# Patient Record
Sex: Female | Born: 1935 | Race: White | Hispanic: No | Marital: Married | Smoking: Never smoker
Health system: Southern US, Community
[De-identification: ages and names within clinical notes are randomized; demographics above are authoritative.]

## PROBLEM LIST (undated history)

## (undated) DIAGNOSIS — E782 Mixed hyperlipidemia: Secondary | ICD-10-CM

## (undated) DIAGNOSIS — I340 Nonrheumatic mitral (valve) insufficiency: Secondary | ICD-10-CM

## (undated) DIAGNOSIS — R600 Localized edema: Secondary | ICD-10-CM

## (undated) DIAGNOSIS — E559 Vitamin D deficiency, unspecified: Secondary | ICD-10-CM

## (undated) DIAGNOSIS — M6289 Other specified disorders of muscle: Secondary | ICD-10-CM

## (undated) DIAGNOSIS — M1990 Unspecified osteoarthritis, unspecified site: Secondary | ICD-10-CM

## (undated) DIAGNOSIS — M51369 Other intervertebral disc degeneration, lumbar region without mention of lumbar back pain or lower extremity pain: Secondary | ICD-10-CM

## (undated) DIAGNOSIS — M5136 Other intervertebral disc degeneration, lumbar region: Secondary | ICD-10-CM

## (undated) DIAGNOSIS — I129 Hypertensive chronic kidney disease with stage 1 through stage 4 chronic kidney disease, or unspecified chronic kidney disease: Secondary | ICD-10-CM

## (undated) DIAGNOSIS — R9431 Abnormal electrocardiogram [ECG] [EKG]: Secondary | ICD-10-CM

## (undated) DIAGNOSIS — I351 Nonrheumatic aortic (valve) insufficiency: Secondary | ICD-10-CM

## (undated) DIAGNOSIS — G5600 Carpal tunnel syndrome, unspecified upper limb: Secondary | ICD-10-CM

## (undated) DIAGNOSIS — J90 Pleural effusion, not elsewhere classified: Secondary | ICD-10-CM

## (undated) DIAGNOSIS — C4490 Unspecified malignant neoplasm of skin, unspecified: Secondary | ICD-10-CM

## (undated) DIAGNOSIS — Z780 Asymptomatic menopausal state: Secondary | ICD-10-CM

## (undated) DIAGNOSIS — I10 Essential (primary) hypertension: Secondary | ICD-10-CM

## (undated) DIAGNOSIS — N183 Chronic kidney disease, stage 3 unspecified: Secondary | ICD-10-CM

## (undated) HISTORY — DX: Abnormal electrocardiogram (ECG) (EKG): R94.31

## (undated) HISTORY — DX: Carpal tunnel syndrome, unspecified upper limb: G56.00

## (undated) HISTORY — DX: Asymptomatic menopausal state: Z78.0

## (undated) HISTORY — DX: Nonrheumatic aortic (valve) insufficiency: I35.1

## (undated) HISTORY — DX: Unspecified osteoarthritis, unspecified site: M19.90

## (undated) HISTORY — DX: Hypertensive chronic kidney disease with stage 1 through stage 4 chronic kidney disease, or unspecified chronic kidney disease: I12.9

## (undated) HISTORY — DX: Other intervertebral disc degeneration, lumbar region: M51.36

## (undated) HISTORY — DX: Chronic kidney disease, stage 3 unspecified: N18.30

## (undated) HISTORY — PX: CARPAL TUNNEL RELEASE: SHX101

## (undated) HISTORY — DX: Other specified disorders of muscle: M62.89

## (undated) HISTORY — DX: Mixed hyperlipidemia: E78.2

## (undated) HISTORY — DX: Other intervertebral disc degeneration, lumbar region without mention of lumbar back pain or lower extremity pain: M51.369

## (undated) HISTORY — DX: Localized edema: R60.0

## (undated) HISTORY — DX: Pleural effusion, not elsewhere classified: J90

## (undated) HISTORY — DX: Vitamin D deficiency, unspecified: E55.9

## (undated) HISTORY — DX: Nonrheumatic mitral (valve) insufficiency: I34.0

---

## 2000-02-24 ENCOUNTER — Encounter: Payer: Self-pay | Admitting: Obstetrics and Gynecology

## 2000-02-24 ENCOUNTER — Encounter: Admission: RE | Admit: 2000-02-24 | Discharge: 2000-02-24 | Payer: Self-pay | Admitting: Obstetrics and Gynecology

## 2000-04-14 ENCOUNTER — Other Ambulatory Visit: Admission: RE | Admit: 2000-04-14 | Discharge: 2000-04-14 | Payer: Self-pay | Admitting: Obstetrics and Gynecology

## 2001-02-25 ENCOUNTER — Encounter: Admission: RE | Admit: 2001-02-25 | Discharge: 2001-02-25 | Payer: Self-pay | Admitting: Obstetrics and Gynecology

## 2001-02-25 ENCOUNTER — Encounter: Payer: Self-pay | Admitting: Obstetrics and Gynecology

## 2001-03-25 ENCOUNTER — Ambulatory Visit (HOSPITAL_COMMUNITY): Admission: RE | Admit: 2001-03-25 | Discharge: 2001-03-25 | Payer: Self-pay | Admitting: *Deleted

## 2001-04-18 ENCOUNTER — Other Ambulatory Visit: Admission: RE | Admit: 2001-04-18 | Discharge: 2001-04-18 | Payer: Self-pay | Admitting: Obstetrics and Gynecology

## 2002-02-27 ENCOUNTER — Encounter: Payer: Self-pay | Admitting: Obstetrics and Gynecology

## 2002-02-27 ENCOUNTER — Encounter: Admission: RE | Admit: 2002-02-27 | Discharge: 2002-02-27 | Payer: Self-pay | Admitting: Obstetrics and Gynecology

## 2003-03-01 ENCOUNTER — Encounter: Admission: RE | Admit: 2003-03-01 | Discharge: 2003-03-01 | Payer: Self-pay | Admitting: Obstetrics and Gynecology

## 2003-03-01 ENCOUNTER — Encounter: Payer: Self-pay | Admitting: Obstetrics and Gynecology

## 2004-01-25 ENCOUNTER — Ambulatory Visit (HOSPITAL_COMMUNITY): Admission: RE | Admit: 2004-01-25 | Discharge: 2004-01-25 | Payer: Self-pay | Admitting: Orthopedic Surgery

## 2004-03-12 ENCOUNTER — Encounter: Admission: RE | Admit: 2004-03-12 | Discharge: 2004-03-12 | Payer: Self-pay | Admitting: Internal Medicine

## 2005-03-30 ENCOUNTER — Encounter: Admission: RE | Admit: 2005-03-30 | Discharge: 2005-03-30 | Payer: Self-pay | Admitting: Obstetrics and Gynecology

## 2006-04-01 ENCOUNTER — Encounter: Admission: RE | Admit: 2006-04-01 | Discharge: 2006-04-01 | Payer: Self-pay | Admitting: Obstetrics and Gynecology

## 2007-04-05 ENCOUNTER — Encounter: Admission: RE | Admit: 2007-04-05 | Discharge: 2007-04-05 | Payer: Self-pay | Admitting: Internal Medicine

## 2007-12-22 HISTORY — PX: ANTERIOR CERVICAL DECOMP/DISCECTOMY FUSION: SHX1161

## 2008-04-06 ENCOUNTER — Encounter: Admission: RE | Admit: 2008-04-06 | Discharge: 2008-04-06 | Payer: Self-pay | Admitting: Internal Medicine

## 2008-12-17 ENCOUNTER — Inpatient Hospital Stay (HOSPITAL_COMMUNITY): Admission: RE | Admit: 2008-12-17 | Discharge: 2008-12-18 | Payer: Self-pay | Admitting: Neurosurgery

## 2009-04-04 ENCOUNTER — Encounter: Admission: RE | Admit: 2009-04-04 | Discharge: 2009-04-04 | Payer: Self-pay | Admitting: Internal Medicine

## 2010-04-07 ENCOUNTER — Encounter: Admission: RE | Admit: 2010-04-07 | Discharge: 2010-04-07 | Payer: Self-pay | Admitting: Internal Medicine

## 2010-04-10 ENCOUNTER — Encounter: Admission: RE | Admit: 2010-04-10 | Discharge: 2010-04-10 | Payer: Self-pay | Admitting: Internal Medicine

## 2011-03-10 ENCOUNTER — Other Ambulatory Visit: Payer: Self-pay | Admitting: Internal Medicine

## 2011-03-10 DIAGNOSIS — Z1231 Encounter for screening mammogram for malignant neoplasm of breast: Secondary | ICD-10-CM

## 2011-04-13 ENCOUNTER — Ambulatory Visit: Payer: Self-pay

## 2011-04-20 ENCOUNTER — Ambulatory Visit
Admission: RE | Admit: 2011-04-20 | Discharge: 2011-04-20 | Disposition: A | Discharge status: Home / Self Care | Payer: BC Managed Care – PPO | Source: Ambulatory Visit | Attending: Internal Medicine | Admitting: Internal Medicine

## 2011-04-20 DIAGNOSIS — Z1231 Encounter for screening mammogram for malignant neoplasm of breast: Secondary | ICD-10-CM

## 2011-05-05 NOTE — Op Note (Signed)
NAME:  Jillian Cooley, Jillian Cooley NO.:  [id number]   MEDICAL RECORD NO.:  [id number]          PATIENT TYPE:  INP   LOCATION:                               FACILITY:  [facility]   PHYSICIAN:  Ophelia Charter, M.D.DATE OF BIRTH:  [birth year]   DATE OF PROCEDURE:  12/16/2008  DATE OF DISCHARGE:                               OPERATIVE REPORT   BRIEF HISTORY:  The patient is a 77 year old white female who has  suffered from neck and arm pain.  She failed medical management, was  worked up with a cervical MRI which demonstrated that she had severe  stenosis and herniated disk spondylosis at C5-C6.  I discussed the  various treatment option with the patient and recommended she consider  surgery.  The patient has weighed the risks, benefits, and alternatives  of the surgery and decided to proceed with C5-C6 anterior cervical  diskectomy with fusion plating.   PREOPERATIVE DIAGNOSES:  C5-C6 spondylosis, herniated nucleus pulposus,  stenosis, cervical radiculopathy, transverse myelopathy, and  cervicalgia.   POSTOPERATIVE DIAGNOSES:  C5-C6 spondylosis, herniated nucleus pulposus,  stenosis, cervical radiculopathy, transverse myelopathy, and  cervicalgia.   PROCEDURE:  C5-C6 extensive anterior cervical diskectomy/decompression;  C5-C6 anterior interbody arthrodesis with local morselized autograft  bone and Actifuse bone graft extender; insertion of C5-C6 interbody  prosthesis (Novel PEEK interbody prosthesis); C5-C6 anterior cervical  plating with Codman Slim-Loc titanium plate and screws.   SURGEON:  Ophelia Charter, MD   ASSISTANT:  Ashok Pall, MD   ANESTHESIA:  General endotracheal.   ESTIMATED BLOOD LOSS:  75 mL.   SPECIMENS:  None.   DRAINS:  None.   COMPLICATIONS:  None.   DESCRIPTION OF PROCEDURE:  The patient was brought to the operating room  by the anesthesia team.  General endotracheal anesthesia was induced.  The patient remained in supine position.  A  roll was placed under her  shoulders to place her neck in slight extension.  Her anterior cervical  region was then prepared with Betadine scrub and Betadine solution.  Sterile drapes were applied.  I then injected the area to be incised  with Marcaine with epinephrine solution.  I used a scalpel to make a  transverse incision in the patient's left anterior neck.  I used a  Metzenbaum scissors to divide the platysma muscle and then to dissect  medial to sternocleidomastoid muscle, jugular vein, and carotid artery.  I carefully dissected down towards the anterior cervical spine  identifying the esophagus to retract it medially.  I then used a skin  swabs to clear the soft tissue from anterior cervical spine.  I then  inserted a bent spinal needle into the upper exposed intervertebral  space.  We obtained the intraoperative radiograph to confirm our  location.  We then used electrocautery to detach the medial border of  the longus colli muscle bilaterally from C5-C6 intervertebral disk  space.  We inserted the Caspar self-retaining retractor underneath the  longus colli muscle bilaterally to provide exposure.   We began the decompression by incising C5-6 intervertebral disk, it was  quite spondylotic.  We then inserted  distraction screws at C5-C6,  distracted interspace, and then we used a high-speed drill to drill away  some ventral spondylosis and to drill away the remainder of C5  intervertebral disk to decorticate the vertebral endplates at 075-GRM to  drill away some posterior spondylosis to thin out the posterior  longitudinal ligament.  We then incised the ligament with arachnoid  knife and then removed it with Kerrison punch undercutting the vertebral  endplates at 075-GRM, decompressing the thecal sac, there was quite a bit  of spondylosis.  We did encounter a large central soft ruptured disk.  We then performed wide foraminotomies about the bilateral C6 nerve root  completing the  decompression at this level.   We now turned our attention to arthrodesis.  We used a trial spacer,  then determined to use a 6-mm medium interbody prosthesis.  We prefilled  the prosthesis with a combination of local autograft bone we obtained  during the decompression as well as Actifuse bone graft extender.  We  inserted prosthesis into distracted C5-6 interspace and then removed the  distraction screws.  There was a good snug fit of the prosthesis in the  interspace.  This completed the arthrodesis and the insertion of the  prosthesis.   We now turned our attention to anterior spinal instrumentation.  We used  a high-speed drill to remove some ventral spondylosis from the vertebral  endplates at D34-534, so the plate would lie down flat.  We selected  appropriate length Codman Slim-Loc titanium plate and laid it along the  anterior aspect of the vertebral bodies at C5-C6.  We then drilled two  12-mm holes at C5-C6 and this secured the plate to the vertebral bodies  by placing two 12-mm self-tapping screws at C5-C6.  We then obtained  intraoperative radiograph which demonstrated good position of the plate  and screws and interbody prosthesis.  We then secured the screws to the  plate by locking each cam.   We then obtained hemostasis using bipolar electrocautery. We irrigated  the wound out with bacitracin solution, removed the retractor.  We  inspected esophagus for any damage, there was none apparent.  We then  reapproximated the patient's platysma muscle with interrupted 3-0 Vicryl  suture, the subcutaneous tissue with interrupted 3-0 Vicryl suture, and  the skin with Steri-Strips and Benzoin. The wound was then coated with  bacitracin ointment.  A sterile dressing was applied.  The drapes were  removed, and the patient was subsequently extubated by anesthesia team  and transported to the post anesthesia care unit in stable condition.  All sponge, instrument, and needle counts were  correct at the end of  this case.      Ophelia Charter, M.D.  Electronically Signed     JDJ/MEDQ  D:  12/17/2008  T:  12/18/2008  Job:  [id number]

## 2011-05-08 NOTE — Procedures (Signed)
[facility]  Patient:    Jillian Cooley, Jillian Cooley                   MRN: [id number] Proc. Date: 03/25/01 Adm. Date:  [id number] Attending:  Jim Desanctis                           Procedure Report  PROCEDURE:  Colonoscopy.  INDICATIONS:  Ms. Jillian Cooley is undergoing colonoscopy for colon cancer screening.  ANESTHESIA:  Demerol 80 mg, Versed 8 mg.  DESCRIPTION OF PROCEDURE:  With the patient mildly sedated in the left lateral decubitus position, the Olympus videoscopic colonoscope was inserted into the rectum and passed through a tortuous sigmoid colon to the cecum.  The cecum identified by the ileocecal valve and appendiceal orifice, both of which were photographed.  From this point, the colonoscope was slowly withdrawn taking circumferential views of the entire colonic mucosa, stopping only in the rectum, which appeared normal on direct view and showed internal hemorrhoids in retroflexed view.  The endoscope was straightened and withdrawn.  The patients vital signs and pulse oximeter remained stable.  The patient tolerated the procedure well without apparent complications.  FINDINGS:  Internal hemorrhoids.  Otherwise unremarkable examination.  PLAN:  Repeat examination in five to ten years. DD:  03/25/01 TD:  03/25/01 Job: [id number]

## 2011-09-25 LAB — BASIC METABOLIC PANEL WITH GFR
BUN: 23 mg/dL (ref 6–23)
CO2: 28 meq/L (ref 19–32)
Calcium: 9.1 mg/dL (ref 8.4–10.5)
Chloride: 102 meq/L (ref 96–112)
Creatinine, Ser: 1.37 mg/dL — ABNORMAL HIGH (ref 0.4–1.2)
GFR calc non Af Amer: 38 mL/min — ABNORMAL LOW
Glucose, Bld: 99 mg/dL (ref 70–99)
Potassium: 4.2 meq/L (ref 3.5–5.1)
Sodium: 138 meq/L (ref 135–145)

## 2011-09-25 LAB — CBC
HCT: 37.9 % (ref 36.0–46.0)
Hemoglobin: 12.7 g/dL (ref 12.0–15.0)
MCHC: 33.5 g/dL (ref 30.0–36.0)
MCV: 90.7 fL (ref 78.0–100.0)
Platelets: 191 10*3/uL (ref 150–400)
RBC: 4.18 MIL/uL (ref 3.87–5.11)
RDW: 11.7 % (ref 11.5–15.5)
WBC: 6 10*3/uL (ref 4.0–10.5)

## 2012-03-31 ENCOUNTER — Other Ambulatory Visit: Payer: Self-pay | Admitting: Internal Medicine

## 2012-03-31 DIAGNOSIS — Z1231 Encounter for screening mammogram for malignant neoplasm of breast: Secondary | ICD-10-CM

## 2012-04-29 ENCOUNTER — Ambulatory Visit: Payer: BC Managed Care – PPO

## 2012-05-03 ENCOUNTER — Ambulatory Visit: Payer: BC Managed Care – PPO

## 2012-05-10 ENCOUNTER — Ambulatory Visit
Admission: RE | Admit: 2012-05-10 | Discharge: 2012-05-10 | Disposition: A | Discharge status: Home / Self Care | Payer: Medicare Other | Source: Ambulatory Visit | Attending: Internal Medicine | Admitting: Internal Medicine

## 2012-05-10 DIAGNOSIS — Z1231 Encounter for screening mammogram for malignant neoplasm of breast: Secondary | ICD-10-CM

## 2012-05-24 ENCOUNTER — Other Ambulatory Visit: Payer: Self-pay | Admitting: Neurosurgery

## 2012-05-30 ENCOUNTER — Encounter (HOSPITAL_COMMUNITY): Payer: Self-pay | Admitting: Pharmacy Technician

## 2012-06-03 ENCOUNTER — Encounter (HOSPITAL_COMMUNITY): Payer: Self-pay | Admitting: *Deleted

## 2012-06-03 NOTE — Pre-Procedure Instructions (Addendum)
[place]  06/03/2012   Your procedure is scheduled on:  June 24  Report to [facility] at 09:00 AM.  Call this number if you have problems the morning of surgery: [phone]   Remember:   Do not eat or drink:After Midnight.  Take these medicines the morning of surgery with A SIP OF WATER: None   STOP Multiple vitamins and Calcium today (6/19)  Do not wear jewelry, make-up or nail polish.  Do not wear lotions, powders, or perfumes. You may wear deodorant.  Do not shave 48 hours prior to surgery. Men may shave face and neck.  Do not bring valuables to the hospital.  Contacts, dentures or bridgework may not be worn into surgery.  Leave suitcase in the car. After surgery it may be brought to your room.  For patients admitted to the hospital, checkout time is 11:00 AM the day of discharge.             Roselie Awkward [id number] spouse  Special Instructions: CHG Shower Use Special Wash: 1/2 bottle night before surgery and 1/2 bottle morning of surgery.   Please read over the following fact sheets that you were given: Pain Booklet, Coughing and Deep Breathing, MRSA Information and Surgical Site Infection Prevention

## 2012-06-03 NOTE — Progress Notes (Signed)
EKG requested from [facility]. Also requested Chest X-ray if available.

## 2012-06-08 ENCOUNTER — Encounter (HOSPITAL_COMMUNITY)
Admission: RE | Admit: 2012-06-08 | Discharge: 2012-06-08 | Disposition: A | Discharge status: Home / Self Care | Payer: Medicare Other | Source: Ambulatory Visit | Attending: Neurosurgery | Admitting: Neurosurgery

## 2012-06-08 ENCOUNTER — Encounter (HOSPITAL_COMMUNITY): Payer: Self-pay

## 2012-06-08 LAB — CBC
HCT: 36.8 % (ref 36.0–46.0)
MCH: 30.5 pg (ref 26.0–34.0)
MCV: 88.5 fL (ref 78.0–100.0)
Platelets: 163 10*3/uL (ref 150–400)

## 2012-06-08 LAB — BASIC METABOLIC PANEL
BUN: 22 mg/dL (ref 6–23)
CO2: 30 mEq/L (ref 19–32)
Chloride: 100 mEq/L (ref 96–112)
Creatinine, Ser: 1.26 mg/dL — ABNORMAL HIGH (ref 0.50–1.10)
GFR calc Af Amer: 46 mL/min — ABNORMAL LOW (ref >90)
Potassium: 3.7 mEq/L (ref 3.5–5.1)
Sodium: 138 mEq/L (ref 135–145)

## 2012-06-08 LAB — SURGICAL PCR SCREEN: MRSA, PCR: NEGATIVE

## 2012-06-12 MED ORDER — CEFAZOLIN SODIUM 1-5 GM-% IV SOLN
1.0000 g | INTRAVENOUS | Status: AC
  Administered 2012-06-13: 1 g via INTRAVENOUS
  Filled 2012-06-12: qty 50

## 2012-06-13 ENCOUNTER — Ambulatory Visit (HOSPITAL_COMMUNITY): Payer: Medicare Other | Admitting: Anesthesiology

## 2012-06-13 ENCOUNTER — Inpatient Hospital Stay (HOSPITAL_COMMUNITY)
Admission: RE | Admit: 2012-06-13 | Discharge: 2012-06-13 | DRG: 491 | Disposition: A | Discharge status: Home / Self Care | Payer: Medicare Other | Source: Ambulatory Visit | Attending: Neurosurgery | Admitting: Neurosurgery

## 2012-06-13 ENCOUNTER — Encounter (HOSPITAL_COMMUNITY): Payer: Self-pay | Admitting: Anesthesiology

## 2012-06-13 ENCOUNTER — Encounter (HOSPITAL_COMMUNITY): Payer: Self-pay | Admitting: *Deleted

## 2012-06-13 ENCOUNTER — Encounter (HOSPITAL_COMMUNITY)
Admission: RE | Disposition: A | Discharge status: Home / Self Care | Payer: Self-pay | Source: Ambulatory Visit | Attending: Neurosurgery

## 2012-06-13 ENCOUNTER — Ambulatory Visit (HOSPITAL_COMMUNITY): Payer: Medicare Other

## 2012-06-13 DIAGNOSIS — M5126 Other intervertebral disc displacement, lumbar region: Principal | ICD-10-CM

## 2012-06-13 DIAGNOSIS — Z85828 Personal history of other malignant neoplasm of skin: Secondary | ICD-10-CM

## 2012-06-13 DIAGNOSIS — I10 Essential (primary) hypertension: Secondary | ICD-10-CM | POA: Diagnosis present

## 2012-06-13 HISTORY — DX: Essential (primary) hypertension: I10

## 2012-06-13 HISTORY — DX: Unspecified malignant neoplasm of skin, unspecified: C44.90

## 2012-06-13 HISTORY — PX: LUMBAR LAMINECTOMY/DECOMPRESSION MICRODISCECTOMY: SHX5026

## 2012-06-13 SURGERY — LUMBAR LAMINECTOMY/DECOMPRESSION MICRODISCECTOMY 1 LEVEL
Anesthesia: General | Laterality: Right | Wound class: Clean

## 2012-06-13 MED ORDER — DIAZEPAM 5 MG PO TABS: 5.0000 mg | ORAL_TABLET | Freq: Four times a day (QID) | ORAL | Status: AC | PRN

## 2012-06-13 MED ORDER — GLYCOPYRROLATE 0.2 MG/ML IJ SOLN
INTRAMUSCULAR | Status: DC | PRN
  Administered 2012-06-13: 0.4 mg via INTRAVENOUS

## 2012-06-13 MED ORDER — CEFAZOLIN SODIUM-DEXTROSE 2-3 GM-% IV SOLR
2.0000 g | Freq: Three times a day (TID) | INTRAVENOUS | Status: DC
  Administered 2012-06-13: 2 g via INTRAVENOUS
  Filled 2012-06-13 (×2): qty 50

## 2012-06-13 MED ORDER — OXYCODONE-ACETAMINOPHEN 5-325 MG PO TABS: 1.0000 | ORAL_TABLET | ORAL | Status: AC | PRN

## 2012-06-13 MED ORDER — SODIUM CHLORIDE 0.9 % IR SOLN
Status: DC | PRN
  Administered 2012-06-13: 07:00:00

## 2012-06-13 MED ORDER — DOCUSATE SODIUM 100 MG PO CAPS: 100.0000 mg | ORAL_CAPSULE | Freq: Two times a day (BID) | ORAL | Status: DC

## 2012-06-13 MED ORDER — HYDROCODONE-ACETAMINOPHEN 5-325 MG PO TABS: 1.0000 | ORAL_TABLET | ORAL | Status: DC | PRN

## 2012-06-13 MED ORDER — OXYCODONE-ACETAMINOPHEN 5-325 MG PO TABS
1.0000 | ORAL_TABLET | ORAL | Status: DC | PRN
  Administered 2012-06-13: 1 via ORAL
  Filled 2012-06-13: qty 1

## 2012-06-13 MED ORDER — ACETAMINOPHEN 650 MG RE SUPP: 650.0000 mg | RECTAL | Status: DC | PRN

## 2012-06-13 MED ORDER — SIMVASTATIN 10 MG PO TABS
10.0000 mg | ORAL_TABLET | Freq: Every day | ORAL | Status: DC
  Filled 2012-06-13: qty 1

## 2012-06-13 MED ORDER — ACETAMINOPHEN 10 MG/ML IV SOLN
INTRAVENOUS | Status: AC
  Administered 2012-06-13: 1000 mg via INTRAVENOUS
  Filled 2012-06-13: qty 100

## 2012-06-13 MED ORDER — FENTANYL CITRATE 0.05 MG/ML IJ SOLN
INTRAMUSCULAR | Status: DC | PRN
  Administered 2012-06-13: 150 ug via INTRAVENOUS

## 2012-06-13 MED ORDER — THROMBIN 5000 UNITS EX SOLR
CUTANEOUS | Status: DC | PRN
  Administered 2012-06-13 (×2): 5000 [IU] via TOPICAL

## 2012-06-13 MED ORDER — MORPHINE SULFATE 2 MG/ML IJ SOLN: 1.0000 mg | INTRAMUSCULAR | Status: DC | PRN

## 2012-06-13 MED ORDER — DEXAMETHASONE SODIUM PHOSPHATE 4 MG/ML IJ SOLN
INTRAMUSCULAR | Status: DC | PRN
  Administered 2012-06-13: 8 mg via INTRAVENOUS

## 2012-06-13 MED ORDER — ZOLPIDEM TARTRATE 5 MG PO TABS: 5.0000 mg | ORAL_TABLET | Freq: Every evening | ORAL | Status: DC | PRN

## 2012-06-13 MED ORDER — BACITRACIN 50000 UNITS IM SOLR
INTRAMUSCULAR | Status: AC
  Filled 2012-06-13: qty 1

## 2012-06-13 MED ORDER — ACETAMINOPHEN 325 MG PO TABS: 650.0000 mg | ORAL_TABLET | ORAL | Status: DC | PRN

## 2012-06-13 MED ORDER — LACTATED RINGERS IV SOLN: INTRAVENOUS | Status: DC

## 2012-06-13 MED ORDER — DIAZEPAM 5 MG PO TABS: 5.0000 mg | ORAL_TABLET | Freq: Four times a day (QID) | ORAL | Status: DC | PRN

## 2012-06-13 MED ORDER — MENTHOL 3 MG MT LOZG: 1.0000 | LOZENGE | OROMUCOSAL | Status: DC | PRN

## 2012-06-13 MED ORDER — DEXTROSE 5 % IV SOLN
INTRAVENOUS | Status: DC | PRN
  Administered 2012-06-13: 08:00:00 via INTRAVENOUS

## 2012-06-13 MED ORDER — LACTATED RINGERS IV SOLN
INTRAVENOUS | Status: DC | PRN
  Administered 2012-06-13 (×2): via INTRAVENOUS

## 2012-06-13 MED ORDER — HYDROMORPHONE HCL PF 1 MG/ML IJ SOLN
INTRAMUSCULAR | Status: AC
  Filled 2012-06-13: qty 1

## 2012-06-13 MED ORDER — 0.9 % SODIUM CHLORIDE (POUR BTL) OPTIME
TOPICAL | Status: DC | PRN
  Administered 2012-06-13: 1000 mL

## 2012-06-13 MED ORDER — TRIAMTERENE-HCTZ 37.5-25 MG PO TABS
1.0000 | ORAL_TABLET | Freq: Every day | ORAL | Status: DC
  Filled 2012-06-13: qty 1

## 2012-06-13 MED ORDER — ROCURONIUM BROMIDE 100 MG/10ML IV SOLN
INTRAVENOUS | Status: DC | PRN
  Administered 2012-06-13: 50 mg via INTRAVENOUS

## 2012-06-13 MED ORDER — PHENYLEPHRINE HCL 10 MG/ML IJ SOLN
INTRAMUSCULAR | Status: DC | PRN
  Administered 2012-06-13 (×2): 80 ug via INTRAVENOUS
  Administered 2012-06-13: 40 ug via INTRAVENOUS
  Administered 2012-06-13 (×2): 80 ug via INTRAVENOUS

## 2012-06-13 MED ORDER — LIDOCAINE HCL (CARDIAC) 20 MG/ML IV SOLN
INTRAVENOUS | Status: DC | PRN
  Administered 2012-06-13: 70 mg via INTRAVENOUS

## 2012-06-13 MED ORDER — PHENOL 1.4 % MT LIQD: 1.0000 | OROMUCOSAL | Status: DC | PRN

## 2012-06-13 MED ORDER — SODIUM CHLORIDE 0.9 % IV SOLN
INTRAVENOUS | Status: AC
  Filled 2012-06-13: qty 500

## 2012-06-13 MED ORDER — ONDANSETRON HCL 4 MG/2ML IJ SOLN
4.0000 mg | INTRAMUSCULAR | Status: DC | PRN
  Administered 2012-06-13: 4 mg via INTRAVENOUS
  Filled 2012-06-13: qty 2

## 2012-06-13 MED ORDER — HEMOSTATIC AGENTS (NO CHARGE) OPTIME
TOPICAL | Status: DC | PRN
  Administered 2012-06-13: 1 via TOPICAL

## 2012-06-13 MED ORDER — DSS 100 MG PO CAPS: 100.0000 mg | ORAL_CAPSULE | Freq: Two times a day (BID) | ORAL | Status: AC

## 2012-06-13 MED ORDER — HYDROMORPHONE HCL PF 1 MG/ML IJ SOLN
0.2500 mg | INTRAMUSCULAR | Status: DC | PRN
  Administered 2012-06-13: 0.5 mg via INTRAVENOUS

## 2012-06-13 MED ORDER — BUPIVACAINE-EPINEPHRINE PF 0.5-1:200000 % IJ SOLN
INTRAMUSCULAR | Status: DC | PRN
  Administered 2012-06-13: 13 mL

## 2012-06-13 MED ORDER — NEOSTIGMINE METHYLSULFATE 1 MG/ML IJ SOLN
INTRAMUSCULAR | Status: DC | PRN
  Administered 2012-06-13: 3 mg via INTRAVENOUS

## 2012-06-13 MED ORDER — ONDANSETRON HCL 4 MG/2ML IJ SOLN
INTRAMUSCULAR | Status: DC | PRN
  Administered 2012-06-13: 4 mg via INTRAVENOUS

## 2012-06-13 MED ORDER — MIDAZOLAM HCL 5 MG/5ML IJ SOLN
INTRAMUSCULAR | Status: DC | PRN
  Administered 2012-06-13: 2 mg via INTRAVENOUS

## 2012-06-13 MED ORDER — PROPOFOL 10 MG/ML IV EMUL
INTRAVENOUS | Status: DC | PRN
  Administered 2012-06-13: 140 mg via INTRAVENOUS

## 2012-06-13 SURGICAL SUPPLY — 54 items
APL SKNCLS STERI-STRIP NONHPOA (GAUZE/BANDAGES/DRESSINGS) ×1
BAG DECANTER FOR FLEXI CONT (MISCELLANEOUS) ×2 IMPLANT
BENZOIN TINCTURE PRP APPL 2/3 (GAUZE/BANDAGES/DRESSINGS) ×2 IMPLANT
BLADE SURG ROTATE 9660 (MISCELLANEOUS) IMPLANT
BRUSH SCRUB EZ PLAIN DRY (MISCELLANEOUS) ×2 IMPLANT
BUR ACORN 6.0 (BURR) ×2 IMPLANT
BUR MATCHSTICK NEURO 3.0 LAGG (BURR) ×2 IMPLANT
CANISTER SUCTION 2500CC (MISCELLANEOUS) ×2 IMPLANT
CLOTH BEACON ORANGE TIMEOUT ST (SAFETY) ×2 IMPLANT
CONT SPEC 4OZ CLIKSEAL STRL BL (MISCELLANEOUS) ×2 IMPLANT
DRAPE LAPAROTOMY 100X72X124 (DRAPES) ×2 IMPLANT
DRAPE MICROSCOPE LEICA (MISCELLANEOUS) ×2 IMPLANT
DRAPE POUCH INSTRU U-SHP 10X18 (DRAPES) ×2 IMPLANT
DRAPE SURG 17X23 STRL (DRAPES) ×8 IMPLANT
ELECT BLADE 4.0 EZ CLEAN MEGAD (MISCELLANEOUS) ×2
ELECT REM PT RETURN 9FT ADLT (ELECTROSURGICAL) ×2
ELECTRODE BLDE 4.0 EZ CLN MEGD (MISCELLANEOUS) ×1 IMPLANT
ELECTRODE REM PT RTRN 9FT ADLT (ELECTROSURGICAL) ×1 IMPLANT
GAUZE SPONGE 4X4 16PLY XRAY LF (GAUZE/BANDAGES/DRESSINGS) IMPLANT
GLOVE BIO SURGEON STRL SZ8.5 (GLOVE) ×2 IMPLANT
GLOVE BIOGEL PI IND STRL 8.5 (GLOVE) IMPLANT
GLOVE BIOGEL PI INDICATOR 8.5 (GLOVE) ×2
GLOVE ECLIPSE 7.5 STRL STRAW (GLOVE) ×1 IMPLANT
GLOVE EXAM NITRILE LRG STRL (GLOVE) IMPLANT
GLOVE EXAM NITRILE MD LF STRL (GLOVE) ×1 IMPLANT
GLOVE EXAM NITRILE XL STR (GLOVE) IMPLANT
GLOVE EXAM NITRILE XS STR PU (GLOVE) IMPLANT
GLOVE INDICATOR 8.0 STRL GRN (GLOVE) ×1 IMPLANT
GLOVE SS BIOGEL STRL SZ 8 (GLOVE) ×1 IMPLANT
GLOVE SUPERSENSE BIOGEL SZ 8 (GLOVE) ×1
GOWN BRE IMP SLV AUR LG STRL (GOWN DISPOSABLE) IMPLANT
GOWN BRE IMP SLV AUR XL STRL (GOWN DISPOSABLE) ×3 IMPLANT
GOWN STRL REIN 2XL LVL4 (GOWN DISPOSABLE) IMPLANT
KIT BASIN OR (CUSTOM PROCEDURE TRAY) ×2 IMPLANT
KIT ROOM TURNOVER OR (KITS) ×2 IMPLANT
NDL HYPO 21X1.5 SAFETY (NEEDLE) IMPLANT
NEEDLE HYPO 21X1.5 SAFETY (NEEDLE) IMPLANT
NEEDLE HYPO 22GX1.5 SAFETY (NEEDLE) ×2 IMPLANT
NS IRRIG 1000ML POUR BTL (IV SOLUTION) ×2 IMPLANT
PACK LAMINECTOMY NEURO (CUSTOM PROCEDURE TRAY) ×2 IMPLANT
PAD ARMBOARD 7.5X6 YLW CONV (MISCELLANEOUS) ×6 IMPLANT
PATTIES SURGICAL .5 X1 (DISPOSABLE) IMPLANT
RUBBERBAND STERILE (MISCELLANEOUS) ×4 IMPLANT
SPONGE GAUZE 4X4 12PLY (GAUZE/BANDAGES/DRESSINGS) ×2 IMPLANT
SPONGE SURGIFOAM ABS GEL SZ50 (HEMOSTASIS) ×2 IMPLANT
STRIP CLOSURE SKIN 1/2X4 (GAUZE/BANDAGES/DRESSINGS) ×2 IMPLANT
SUT VIC AB 1 CT1 18XBRD ANBCTR (SUTURE) ×1 IMPLANT
SUT VIC AB 1 CT1 8-18 (SUTURE) ×2
SUT VIC AB 2-0 CP2 18 (SUTURE) ×2 IMPLANT
SYR 20CC LL (SYRINGE) IMPLANT
SYR 20ML ECCENTRIC (SYRINGE) ×2 IMPLANT
TOWEL OR 17X24 6PK STRL BLUE (TOWEL DISPOSABLE) ×2 IMPLANT
TOWEL OR 17X26 10 PK STRL BLUE (TOWEL DISPOSABLE) ×2 IMPLANT
WATER STERILE IRR 1000ML POUR (IV SOLUTION) ×2 IMPLANT

## 2012-06-13 NOTE — Discharge Instructions (Signed)

## 2012-06-13 NOTE — Preoperative (Signed)
Beta Blockers   Reason not to administer Beta Blockers:Not Applicable 

## 2012-06-13 NOTE — Anesthesia Procedure Notes (Signed)
Procedure Name: Intubation Date/Time: 06/13/2012 7:36 AM Performed by: Wanita Chamberlain Pre-anesthesia Checklist: Patient identified, Emergency Drugs available, Suction available, Patient being monitored and Timeout performed Patient Re-evaluated:Patient Re-evaluated prior to inductionOxygen Delivery Method: Circle system utilized Preoxygenation: Pre-oxygenation with 100% oxygen Intubation Type: IV induction Ventilation: Mask ventilation without difficulty Laryngoscope Size: Mac and 3 Grade View: Grade I Tube type: Oral Tube size: 7.5 mm Number of attempts: 1 Airway Equipment and Method: Stylet Placement Confirmation: ETT inserted through vocal cords under direct vision,  breath sounds checked- equal and bilateral and positive ETCO2 Secured at: 22 cm Tube secured with: Tape Dental Injury: Teeth and Oropharynx as per pre-operative assessment

## 2012-06-13 NOTE — OR Nursing (Signed)
Time out done at [facility] not [phone]

## 2012-06-13 NOTE — Progress Notes (Signed)
Pt. Tolerated procedure well. Pt. Alert and oriented,follows simple instructions, denies pain. Incision area without swelling, redness or S/S of infection. Voiding adequate clear yellow urine. Moving all extremities well and vitals stable and documented. Lumbar surgery notes instructions given to patient and family member for home safety and precautions.Pt and family stated understanding of instructions given

## 2012-06-13 NOTE — Progress Notes (Signed)
Patient ID: Jillian Cooley, female   DOB: April 16, 1932, 78 y.o.   MRN: [id number] Subjective:  The patient is alert and pleasant. She looks well. She is in no apparent distress.  Objective: Vital signs in last 24 hours: Temp:  [96.8 F (36 C)-97.8 F (36.6 C)] 96.8 F (36 C) (06/24 0933) Pulse Rate:  [66-70] 70  (06/24 0943) Resp:  [18-24] 24  (06/24 0943) BP: (130-146)/(57-81) 130/57 mmHg (06/24 0933) SpO2:  [99 %-100 %] 100 % (06/24 0943)  Intake/Output from previous day:   Intake/Output this shift: Total I/O In: 1850 [I.V.:1850] Out: 25 [Blood:25]  Physical exam the patient is alert and pleasant. She is moving all 4 extremities well. Her dressing is clean and dry.  Lab Results: No results found for this basename: WBC:2,HGB:2,HCT:2,PLT:2 in the last 72 hours BMET No results found for this basename: NA:2,K:2,CL:2,CO2:2,GLUCOSE:2,BUN:2,CREATININE:2,CALCIUM:2 in the last 72 hours  Studies/Results: No results found.  Assessment/Plan: The patient is doing well.  LOS: 0 days     Najla Aughenbaugh D 06/13/2012, 9:49 AM

## 2012-06-13 NOTE — Anesthesia Preprocedure Evaluation (Addendum)
Anesthesia Evaluation  Patient identified by MRN, date of birth, ID band Patient awake    Reviewed: Allergy & Precautions, H&P , NPO status , Patient's Chart, lab work & pertinent test results  History of Anesthesia Complications Negative for: history of anesthetic complications  Airway Mallampati: II TM Distance: >3 FB     Dental  (+) Teeth Intact   Pulmonary neg pulmonary ROS,    Pulmonary exam normal       Cardiovascular Exercise Tolerance: Good hypertension, Pt. on medications Rhythm:Regular Rate:Normal     Neuro/Psych Previous ACDF negative psych ROS   GI/Hepatic negative GI ROS, Neg liver ROS,   Endo/Other  negative endocrine ROS  Renal/GU negative Renal ROS  negative genitourinary   Musculoskeletal negative musculoskeletal ROS (+)   Abdominal Normal abdominal exam  (+)   Peds  Hematology negative hematology ROS (+)   Anesthesia Other Findings   Reproductive/Obstetrics negative OB ROS                         Anesthesia Physical Anesthesia Plan  ASA: II  Anesthesia Plan: General   Post-op Pain Management:    Induction: Intravenous  Airway Management Planned: Oral ETT  Additional Equipment:   Intra-op Plan:   Post-operative Plan: Extubation in OR  Informed Consent:   Dental advisory given  Plan Discussed with: CRNA  Anesthesia Plan Comments:         Anesthesia Quick Evaluation

## 2012-06-13 NOTE — H&P (Signed)
Subjective: The patient is a 78 year old white female who I performed a C5-6 anterior cervicectomy fusion and plating on back in December 2009. The patient has done well for many years. She has developed back and right leg pain consistent with a lumbar radiculopathy. She failed medical management and was worked up with a lumbar MRI. This demonstrated the patient has disc herniation at L2-3. I discussed the various treatment with the patient including surgery. She has weighed the risks, benefits, and alternatives surgery decided proceed with a right L2-3 discectomy.   Past Medical History  Diagnosis Date  . Hypertension   . Skin cancer     Past Surgical History  Procedure Date  . Anterior cervical decomp/discectomy fusion 2009    Allergies  Allergen Reactions  . Sulfa Antibiotics Nausea Only    History  Substance Use Topics  . Smoking status: Never Smoker   . Smokeless tobacco: Never Used  . Alcohol Use: No    History reviewed. No pertinent family history. Prior to Admission medications   Medication Sig Start Date End Date Taking? Authorizing Provider  CALCIUM-VITAMIN D PO Take 1 tablet by mouth daily.   Yes Historical Provider, MD  lovastatin (MEVACOR) 20 MG tablet Take 20 mg by mouth at bedtime.   Yes Historical Provider, MD  Multiple Vitamins-Minerals (ICAPS AREDS FORMULA PO) Take 1 capsule by mouth daily.   Yes Historical Provider, MD  triamterene-hydrochlorothiazide (MAXZIDE-25) 37.5-25 MG per tablet Take 1 tablet by mouth daily.   Yes Historical Provider, MD     Review of Systems  Positive ROS: As above  All other systems have been reviewed and were otherwise negative with the exception of those mentioned in the HPI and as above.  Objective: Vital signs in last 24 hours: Temp:  [97.8 F (36.6 C)] 97.8 F (36.6 C) (06/24 0625) Pulse Rate:  [66] 66  (06/24 0625) Resp:  [18] 18  (06/24 0625) BP: (146)/(81) 146/81 mmHg (06/24 0625) SpO2:  [99 %] 99 % (06/24  0625)  General Appearance: Alert, cooperative, no distress, appears stated age Head: Normocephalic, without obvious abnormality, atraumatic Eyes: PERRL, conjunctiva/corneas clear, EOM's intact, fundi benign, both eyes      Ears: Normal TM's and external ear canals, both ears Throat: Lips, mucosa, and tongue normal; teeth and gums normal Neck: Supple, symmetrical, trachea midline, no adenopathy; thyroid: No enlargement/tenderness/nodules; no carotid bruit or JVD the patient's surgical incision is well-healed. Back: Symmetric, no curvature, ROM normal, no CVA tenderness Lungs: Clear to auscultation bilaterally, respirations unlabored Heart: Regular rate and rhythm, S1 and S2 normal, no murmur, rub or gallop Abdomen: Soft, non-tender, bowel sounds active all four quadrants, no masses, no organomegaly Extremities: Extremities normal, atraumatic, no cyanosis or edema Pulses: 2+ and symmetric all extremities Skin: Skin color, texture, turgor normal, no rashes or lesions  NEUROLOGIC:   Mental status: alert and oriented, no aphasia, good attention span, Fund of knowledge/ memory ok Motor Exam - grossly normal Sensory Exam - grossly normal Reflexes:  Coordination - grossly normal Gait - grossly normal Balance - grossly normal Cranial Nerves: I: smell Not tested  II: visual acuity  OS: Normal    OD: Normal   II: visual fields Full to confrontation  II: pupils Equal, round, reactive to light  III,VII: ptosis None  III,IV,VI: extraocular muscles  Full ROM  V: mastication Normal  V: facial light touch sensation  Normal  V,VII: corneal reflex  Present  VII: facial muscle function - upper  Normal  VII:  facial muscle function - lower Normal  VIII: hearing Not tested  IX: soft palate elevation  Normal  IX,X: gag reflex Present  XI: trapezius strength  5/5  XI: sternocleidomastoid strength 5/5  XI: neck flexion strength  5/5  XII: tongue strength  Normal    Data Review Lab Results   Component Value Date   WBC 5.3 06/08/2012   HGB 12.7 06/08/2012   HCT 36.8 06/08/2012   MCV 88.5 06/08/2012   PLT 163 06/08/2012   Lab Results  Component Value Date   NA 138 06/08/2012   K 3.7 06/08/2012   CL 100 06/08/2012   CO2 30 06/08/2012   BUN 22 06/08/2012   CREATININE 1.26* 06/08/2012   GLUCOSE 95 06/08/2012   No results found for this basename: INR, PROTIME    Assessment/Plan: Right L2-3 disc herniation, spinal stenosis, lumbar radiculopathy, lumbago: I discussed situation with the patient and her husband. I reviewed the MR scan with them and pointed out the abnormalities. We have discussed the various treatment options including a right L2-3 discectomy. I've shown her surgical models. We have discussed the risk, benefits, alternatives and likelihood of achieving our goals with surgery. I have answered all the patient and her husband's questions. She wants to proceed with the operation. Yvette Loveless D 06/13/2012 7:20 AM

## 2012-06-13 NOTE — Anesthesia Postprocedure Evaluation (Signed)
  Anesthesia Post-op Note  Patient: Jillian Cooley  Procedure(s) Performed: Procedure(s) (LRB): LUMBAR LAMINECTOMY/DECOMPRESSION MICRODISCECTOMY 1 LEVEL (Right)  Patient Location: PACU  Anesthesia Type: General  Level of Consciousness: awake  Airway and Oxygen Therapy: Patient Spontanous Breathing  Post-op Pain: mild  Post-op Assessment: Post-op Vital signs reviewed  Post-op Vital Signs: Reviewed  Complications: No apparent anesthesia complications

## 2012-06-13 NOTE — Progress Notes (Signed)
Patient ID: Jillian Cooley, female   DOB: 05-Jun-1932, 78 y.o.   MRN: [id number] Subjective:  The patient is alert and pleasant. She looks well. She has no complaints.  Objective: Vital signs in last 24 hours: Temp:  [96.8 F (36 C)-97.8 F (36.6 C)] 97.6 F (36.4 C) (06/24 1230) Pulse Rate:  [56-70] 56  (06/24 1230) Resp:  [11-24] 18  (06/24 1230) BP: (114-146)/(53-81) 136/53 mmHg (06/24 1230) SpO2:  [94 %-100 %] 96 % (06/24 1230)  Intake/Output from previous day:   Intake/Output this shift: Total I/O In: 2090 [P.O.:240; I.V.:1850] Out: 25 [Blood:25]  Physical exam the patient is alert and oriented. She is moving all 4 extremities well.  Lab Results: No results found for this basename: WBC:2,HGB:2,HCT:2,PLT:2 in the last 72 hours BMET No results found for this basename: NA:2,K:2,CL:2,CO2:2,GLUCOSE:2,BUN:2,CREATININE:2,CALCIUM:2 in the last 72 hours  Studies/Results: Dg Lumbar Spine 2-3 Views  06/13/2012  *RADIOLOGY REPORT*  Clinical Data: Left L2-3 discectomy  LUMBAR SPINE - 2-3 VIEW  Comparison: None.  Findings: Initial image does not show a visible marker.  Second image shows a probe between the spinous processes of L3 and L4.  IMPRESSION: L3-4 interspinous space localized.  Original Report Authenticated By: Jules Schick, M.D.    Assessment/Plan: The patient is doing well and may go home later on this afternoon. I gave her her discharge instructions and answered all her questions.  LOS: 0 days     [name] D 06/13/2012, 1:00 PM

## 2012-06-13 NOTE — Op Note (Signed)
Brief history: The patient is a 78 year old white female who has suffered from back and right leg pain consistent with a lumbar radiculopathy. She has failed medical management and was worked up with a lumbar MRI. This demonstrated a large herniated disc at L2-3 on the right. I discussed the various treatment option with the patient including surgery. She has weighed the risks, benefits, and alternatives surgery and decided proceed with a right L2-3 discectomy.  Preoperative diagnosis: Right L2-3 herniated disc compressing both the right L2 and right L3 nerve roots, lumbar spinal stenosis, lumbar radiculopathy, lumbago  Postoperative diagnosis: The same  Procedure: Right L2-3 Intervertebral discectomy to decompress the right L2 as well as L3 nerve roots using micro-dissection  Surgeon: Dr. Earle Gell  Asst.: Dr. Hazle Coca  Anesthesia: Gen. endotracheal  Estimated blood loss: 50 cc  Drains: None  Complications: None  Description of procedure: The patient was brought to the operating room by the anesthesia team. General endotracheal anesthesia was induced. The patient was turned to the prone position on the Wilson frame. [name] lumbosacral region was then prepared with Betadine scrub and Betadine solution. Sterile drapes were applied.  I then injected the area to be incised with Marcaine with epinephrine solution. I then used a scalpel to make a linear midline incision over the L2-3 intervertebral disc space. I then used electrocautery to perform a right sided subperiosteal dissection exposing the spinous process and lamina of L2 and L3. We obtained intraoperative radiograph to confirm our location. I then inserted the [facility] retractor for exposure.  We then brought the operative microscope into the field. Under its magnification and illumination we completed the microdissection. I used a high-speed drill to perform a laminotomy at L2. I then used a Kerrison punches to widen the  laminotomy and removed the ligamentum flavum at L2-3 on the right. We then used microdissection to free up the thecal sac and the right L3 and L2 nerve root from the epidural tissue. I then used a Kerrison punch to perform a foraminotomy at about the right L2 and L3 nerve root. We then using the nerve root retractor to gently retract the thecal sac and the right L3 nerve root medially. This exposed the intervertebral disc. It had migrated in a cephalad direction and was compressing both the L3 nerve root as well as the upper L2 nerve root We identified the ruptured disc and remove it with the pituitary forceps decompressing both the right L2 and L3 nerve roots.  I then palpated along the ventral surface of the thecal sac and along exit route of the right L2 and L3 nerve root and noted that the neural structures were well decompressed. This completed the decompression.  We then obtained hemostasis using bipolar electrocautery. We irrigated the wound out with bacitracin solution. We then removed the retractor. We then reapproximated the patient's thoracolumbar fascia with interrupted #1 Vicryl suture. We then reapproximated the patient's subcutaneous tissue with interrupted 3-0 Vicryl suture. We then reapproximated patient's skin with Steri-Strips and benzoin. The was then coated with bacitracin ointment. The drapes were removed. The patient was subsequently returned to the supine position where they were extubated by the anesthesia team. The patient was then transported to the postanesthesia care unit in stable condition. All sponge instrument and needle counts were correct at the end of this case.

## 2012-06-13 NOTE — Transfer of Care (Signed)
Immediate Anesthesia Transfer of Care Note  Patient: Jillian Cooley  Procedure(s) Performed: Procedure(s) (LRB): LUMBAR LAMINECTOMY/DECOMPRESSION MICRODISCECTOMY 1 LEVEL (Right)  Patient Location: PACU  Anesthesia Type: General  Level of Consciousness: awake, alert , oriented and patient cooperative  Airway & Oxygen Therapy: Patient Spontanous Breathing and Patient connected to nasal cannula oxygen  Post-op Assessment: Report given to PACU RN and Post -op Vital signs reviewed and stable  Post vital signs: Reviewed and stable  Complications: No apparent anesthesia complications

## 2012-06-13 NOTE — Plan of Care (Signed)
Problem: Consults Goal: Diagnosis - Spinal Surgery Outcome: Completed/Met Date Met:  06/13/12 Microdiscectomy

## 2012-06-14 NOTE — Progress Notes (Signed)
Utilization review completed. Sharniece Gibbon, RN, BSN. 

## 2012-06-16 ENCOUNTER — Encounter (HOSPITAL_COMMUNITY): Payer: Self-pay | Admitting: Neurosurgery

## 2012-06-27 NOTE — Discharge Summary (Signed)
  Physician Discharge Summary  Patient ID: QUANNA SCHOLLMEYER MRN: [id number] DOB/AGE: Jan 20, 1932 78 y.o.  Admit date: 06/13/2012 Discharge date: 06/27/2012  Admission Diagnoses: Right L2-3 herniated disc, lumbago, lumbar radiculopathy  Discharge Diagnoses: The same Principal Problem:  *Herniated nucleus pulposus, lumbar   Discharged Condition: good  Hospital Course: I admitted the patient to [facility] on 06/13/2012. On that day I performed a right L2-3 discectomy. The surgery went well. [name] postop course was unremarkable and she was discharged home on 06/13/12.  Consults: Right L2-3 herniated disc Significant Diagnostic Studies: None Treatments: Right L2-3 discectomy to decompress the right L2 and L3 nerve roots Discharge Exam: Blood pressure 114/75, pulse 63, temperature 97.7 F (36.5 C), temperature source Oral, resp. rate 20, SpO2 93.00%. The patient is alert and oriented. Her strength is normal. She looks well.  Disposition: Home   Medication List  As of 06/27/2012  7:36 AM   TAKE these medications         CALCIUM-VITAMIN D PO   Take 1 tablet by mouth daily.      ICAPS AREDS FORMULA PO   Take 1 capsule by mouth daily.      lovastatin 20 MG tablet   Commonly known as: MEVACOR   Take 20 mg by mouth at bedtime.      triamterene-hydrochlorothiazide 37.5-25 MG per tablet   Commonly known as: MAXZIDE-25   Take 1 tablet by mouth daily.             SignedOphelia Charter 06/27/2012, 7:36 AM

## 2013-04-17 ENCOUNTER — Other Ambulatory Visit: Payer: Self-pay

## 2013-04-17 DIAGNOSIS — Z1231 Encounter for screening mammogram for malignant neoplasm of breast: Secondary | ICD-10-CM

## 2013-05-11 ENCOUNTER — Ambulatory Visit
Admission: RE | Admit: 2013-05-11 | Discharge: 2013-05-11 | Disposition: A | Discharge status: Home / Self Care | Payer: Medicare Other | Source: Ambulatory Visit

## 2013-05-11 ENCOUNTER — Ambulatory Visit: Payer: Medicare Other

## 2013-05-11 DIAGNOSIS — Z1231 Encounter for screening mammogram for malignant neoplasm of breast: Secondary | ICD-10-CM

## 2014-04-10 ENCOUNTER — Other Ambulatory Visit: Payer: Self-pay

## 2014-04-10 DIAGNOSIS — Z1231 Encounter for screening mammogram for malignant neoplasm of breast: Secondary | ICD-10-CM

## 2014-05-15 ENCOUNTER — Encounter (INDEPENDENT_AMBULATORY_CARE_PROVIDER_SITE_OTHER): Payer: Self-pay

## 2014-05-15 ENCOUNTER — Ambulatory Visit
Admission: RE | Admit: 2014-05-15 | Discharge: 2014-05-15 | Disposition: A | Discharge status: Home / Self Care | Payer: Medicare Other | Source: Ambulatory Visit

## 2014-05-15 DIAGNOSIS — Z1231 Encounter for screening mammogram for malignant neoplasm of breast: Secondary | ICD-10-CM

## 2015-04-17 ENCOUNTER — Other Ambulatory Visit: Payer: Self-pay

## 2015-04-17 DIAGNOSIS — Z1231 Encounter for screening mammogram for malignant neoplasm of breast: Secondary | ICD-10-CM

## 2015-05-17 ENCOUNTER — Ambulatory Visit: Payer: Self-pay

## 2015-05-21 ENCOUNTER — Ambulatory Visit
Admission: RE | Admit: 2015-05-21 | Discharge: 2015-05-21 | Disposition: A | Discharge status: Home / Self Care | Payer: Medicare Other | Source: Ambulatory Visit

## 2015-05-21 DIAGNOSIS — Z1231 Encounter for screening mammogram for malignant neoplasm of breast: Secondary | ICD-10-CM

## 2015-06-17 ENCOUNTER — Other Ambulatory Visit: Payer: Self-pay

## 2016-04-27 ENCOUNTER — Other Ambulatory Visit: Payer: Self-pay

## 2016-04-27 DIAGNOSIS — Z1231 Encounter for screening mammogram for malignant neoplasm of breast: Secondary | ICD-10-CM

## 2016-05-21 ENCOUNTER — Ambulatory Visit
Admission: RE | Admit: 2016-05-21 | Discharge: 2016-05-21 | Disposition: A | Discharge status: Home / Self Care | Payer: Medicare Other | Source: Ambulatory Visit

## 2016-05-21 DIAGNOSIS — Z1231 Encounter for screening mammogram for malignant neoplasm of breast: Secondary | ICD-10-CM

## 2017-05-21 ENCOUNTER — Other Ambulatory Visit: Payer: Self-pay | Admitting: Internal Medicine

## 2017-05-21 DIAGNOSIS — Z1231 Encounter for screening mammogram for malignant neoplasm of breast: Secondary | ICD-10-CM

## 2017-06-10 ENCOUNTER — Ambulatory Visit
Admission: RE | Admit: 2017-06-10 | Discharge: 2017-06-10 | Disposition: A | Discharge status: Home / Self Care | Payer: Medicare Other | Source: Ambulatory Visit | Attending: Internal Medicine | Admitting: Internal Medicine

## 2017-06-10 DIAGNOSIS — Z1231 Encounter for screening mammogram for malignant neoplasm of breast: Secondary | ICD-10-CM

## 2018-07-25 ENCOUNTER — Other Ambulatory Visit: Payer: Self-pay | Admitting: Internal Medicine

## 2018-07-25 DIAGNOSIS — Z1231 Encounter for screening mammogram for malignant neoplasm of breast: Secondary | ICD-10-CM

## 2018-08-19 ENCOUNTER — Ambulatory Visit
Admission: RE | Admit: 2018-08-19 | Discharge: 2018-08-19 | Disposition: A | Discharge status: Home / Self Care | Payer: Medicare Other | Source: Ambulatory Visit | Attending: Internal Medicine | Admitting: Internal Medicine

## 2018-08-19 DIAGNOSIS — Z1231 Encounter for screening mammogram for malignant neoplasm of breast: Secondary | ICD-10-CM

## 2019-07-18 ENCOUNTER — Other Ambulatory Visit: Payer: Self-pay | Admitting: Internal Medicine

## 2019-07-18 DIAGNOSIS — Z1231 Encounter for screening mammogram for malignant neoplasm of breast: Secondary | ICD-10-CM

## 2019-09-05 ENCOUNTER — Other Ambulatory Visit: Payer: Self-pay

## 2019-09-05 ENCOUNTER — Ambulatory Visit
Admission: RE | Admit: 2019-09-05 | Discharge: 2019-09-05 | Disposition: A | Discharge status: Home / Self Care | Payer: Medicare Other | Source: Ambulatory Visit | Attending: Internal Medicine | Admitting: Internal Medicine

## 2019-09-05 DIAGNOSIS — Z1231 Encounter for screening mammogram for malignant neoplasm of breast: Secondary | ICD-10-CM

## 2020-01-09 ENCOUNTER — Ambulatory Visit: Payer: Medicare PPO | Attending: Internal Medicine

## 2020-01-09 DIAGNOSIS — Z23 Encounter for immunization: Secondary | ICD-10-CM | POA: Insufficient documentation

## 2020-01-09 NOTE — Progress Notes (Signed)
   Covid-19 Vaccination Clinic  Name:  [name]    MRN: [id number] DOB: February 19, 1932  01/09/2020  [name] was observed post Covid-19 immunization for 15 minutes without incidence. She was provided with Vaccine Information Sheet and instruction to access the V-Safe system.   [name] was instructed to call 911 with any severe reactions post vaccine: Marland Kitchen Difficulty breathing  . Swelling of your face and throat  . A fast heartbeat  . A bad rash all over your body  . Dizziness and weakness    Immunizations Administered    Name Date Dose VIS Date Route   Pfizer COVID-19 Vaccine 01/09/2020  9:14 AM 0.3 mL 12/01/2019 Intramuscular   Manufacturer: [place]   Lot: [id number]   [place]: [id number]

## 2020-01-10 DIAGNOSIS — H35722 Serous detachment of retinal pigment epithelium, left eye: Secondary | ICD-10-CM | POA: Diagnosis not present

## 2020-01-10 DIAGNOSIS — H35363 Drusen (degenerative) of macula, bilateral: Secondary | ICD-10-CM | POA: Diagnosis not present

## 2020-01-10 DIAGNOSIS — H35453 Secondary pigmentary degeneration, bilateral: Secondary | ICD-10-CM | POA: Diagnosis not present

## 2020-01-10 DIAGNOSIS — H35373 Puckering of macula, bilateral: Secondary | ICD-10-CM | POA: Diagnosis not present

## 2020-01-10 DIAGNOSIS — H353114 Nonexudative age-related macular degeneration, right eye, advanced atrophic with subfoveal involvement: Secondary | ICD-10-CM | POA: Diagnosis not present

## 2020-01-10 DIAGNOSIS — H353122 Nonexudative age-related macular degeneration, left eye, intermediate dry stage: Secondary | ICD-10-CM | POA: Diagnosis not present

## 2020-01-10 DIAGNOSIS — Z961 Presence of intraocular lens: Secondary | ICD-10-CM | POA: Diagnosis not present

## 2020-01-19 DIAGNOSIS — G5601 Carpal tunnel syndrome, right upper limb: Secondary | ICD-10-CM | POA: Diagnosis not present

## 2020-01-27 ENCOUNTER — Ambulatory Visit: Payer: Medicare PPO

## 2020-01-29 ENCOUNTER — Ambulatory Visit: Payer: Medicare PPO | Attending: Internal Medicine

## 2020-01-29 DIAGNOSIS — Z23 Encounter for immunization: Secondary | ICD-10-CM | POA: Insufficient documentation

## 2020-01-29 NOTE — Progress Notes (Signed)
   Covid-19 Vaccination Clinic  Name:  Nohelia Valenza    MRN: [id number] DOB: [birth year]  01/29/2020  Ms. Mceachron was observed post Covid-19 immunization for 15 minutes without incidence. She was provided with Vaccine Information Sheet and instruction to access the V-Safe system.   Ms. Sweney was instructed to call 911 with any severe reactions post vaccine: Marland Kitchen Difficulty breathing  . Swelling of your face and throat  . A fast heartbeat  . A bad rash all over your body  . Dizziness and weakness    Immunizations Administered    Name Date Dose VIS Date Route   Pfizer COVID-19 Vaccine 01/29/2020  1:17 PM 0.3 mL 12/01/2019 Intramuscular   Manufacturer: [place]   Lot: [id number]   [place]: [id number]

## 2020-02-06 DIAGNOSIS — C44622 Squamous cell carcinoma of skin of right upper limb, including shoulder: Secondary | ICD-10-CM | POA: Diagnosis not present

## 2020-02-07 DIAGNOSIS — C44622 Squamous cell carcinoma of skin of right upper limb, including shoulder: Secondary | ICD-10-CM | POA: Diagnosis not present

## 2020-03-11 DIAGNOSIS — I129 Hypertensive chronic kidney disease with stage 1 through stage 4 chronic kidney disease, or unspecified chronic kidney disease: Secondary | ICD-10-CM | POA: Diagnosis not present

## 2020-03-11 DIAGNOSIS — N183 Chronic kidney disease, stage 3 unspecified: Secondary | ICD-10-CM | POA: Diagnosis not present

## 2020-03-11 DIAGNOSIS — E782 Mixed hyperlipidemia: Secondary | ICD-10-CM | POA: Diagnosis not present

## 2020-03-13 DIAGNOSIS — I129 Hypertensive chronic kidney disease with stage 1 through stage 4 chronic kidney disease, or unspecified chronic kidney disease: Secondary | ICD-10-CM | POA: Diagnosis not present

## 2020-03-13 DIAGNOSIS — E782 Mixed hyperlipidemia: Secondary | ICD-10-CM | POA: Diagnosis not present

## 2020-03-13 DIAGNOSIS — N1832 Chronic kidney disease, stage 3b: Secondary | ICD-10-CM | POA: Diagnosis not present

## 2020-03-13 DIAGNOSIS — M150 Primary generalized (osteo)arthritis: Secondary | ICD-10-CM | POA: Diagnosis not present

## 2020-03-13 DIAGNOSIS — E559 Vitamin D deficiency, unspecified: Secondary | ICD-10-CM | POA: Diagnosis not present

## 2020-04-15 DIAGNOSIS — H35373 Puckering of macula, bilateral: Secondary | ICD-10-CM | POA: Diagnosis not present

## 2020-04-15 DIAGNOSIS — H53411 Scotoma involving central area, right eye: Secondary | ICD-10-CM | POA: Diagnosis not present

## 2020-04-15 DIAGNOSIS — H35453 Secondary pigmentary degeneration, bilateral: Secondary | ICD-10-CM | POA: Diagnosis not present

## 2020-04-15 DIAGNOSIS — H353122 Nonexudative age-related macular degeneration, left eye, intermediate dry stage: Secondary | ICD-10-CM | POA: Diagnosis not present

## 2020-04-15 DIAGNOSIS — H35722 Serous detachment of retinal pigment epithelium, left eye: Secondary | ICD-10-CM | POA: Diagnosis not present

## 2020-04-15 DIAGNOSIS — H35363 Drusen (degenerative) of macula, bilateral: Secondary | ICD-10-CM | POA: Diagnosis not present

## 2020-04-15 DIAGNOSIS — Z961 Presence of intraocular lens: Secondary | ICD-10-CM | POA: Diagnosis not present

## 2020-04-15 DIAGNOSIS — H353114 Nonexudative age-related macular degeneration, right eye, advanced atrophic with subfoveal involvement: Secondary | ICD-10-CM | POA: Diagnosis not present

## 2020-05-29 DIAGNOSIS — G5602 Carpal tunnel syndrome, left upper limb: Secondary | ICD-10-CM | POA: Diagnosis not present

## 2020-06-03 DIAGNOSIS — M654 Radial styloid tenosynovitis [de Quervain]: Secondary | ICD-10-CM | POA: Diagnosis not present

## 2020-06-03 DIAGNOSIS — G5603 Carpal tunnel syndrome, bilateral upper limbs: Secondary | ICD-10-CM | POA: Diagnosis not present

## 2020-06-10 DIAGNOSIS — G5603 Carpal tunnel syndrome, bilateral upper limbs: Secondary | ICD-10-CM | POA: Diagnosis not present

## 2020-06-17 DIAGNOSIS — L821 Other seborrheic keratosis: Secondary | ICD-10-CM | POA: Diagnosis not present

## 2020-06-17 DIAGNOSIS — L570 Actinic keratosis: Secondary | ICD-10-CM | POA: Diagnosis not present

## 2020-07-10 DIAGNOSIS — R29898 Other symptoms and signs involving the musculoskeletal system: Secondary | ICD-10-CM | POA: Diagnosis not present

## 2020-07-10 DIAGNOSIS — G5603 Carpal tunnel syndrome, bilateral upper limbs: Secondary | ICD-10-CM | POA: Diagnosis not present

## 2020-09-02 DIAGNOSIS — H53411 Scotoma involving central area, right eye: Secondary | ICD-10-CM | POA: Diagnosis not present

## 2020-09-02 DIAGNOSIS — H353122 Nonexudative age-related macular degeneration, left eye, intermediate dry stage: Secondary | ICD-10-CM | POA: Diagnosis not present

## 2020-09-02 DIAGNOSIS — H353114 Nonexudative age-related macular degeneration, right eye, advanced atrophic with subfoveal involvement: Secondary | ICD-10-CM | POA: Diagnosis not present

## 2020-09-02 DIAGNOSIS — Z961 Presence of intraocular lens: Secondary | ICD-10-CM | POA: Diagnosis not present

## 2020-09-02 DIAGNOSIS — H35453 Secondary pigmentary degeneration, bilateral: Secondary | ICD-10-CM | POA: Diagnosis not present

## 2020-09-02 DIAGNOSIS — H35373 Puckering of macula, bilateral: Secondary | ICD-10-CM | POA: Diagnosis not present

## 2020-09-02 DIAGNOSIS — H35363 Drusen (degenerative) of macula, bilateral: Secondary | ICD-10-CM | POA: Diagnosis not present

## 2020-09-11 DIAGNOSIS — I10 Essential (primary) hypertension: Secondary | ICD-10-CM | POA: Diagnosis not present

## 2020-09-11 DIAGNOSIS — I129 Hypertensive chronic kidney disease with stage 1 through stage 4 chronic kidney disease, or unspecified chronic kidney disease: Secondary | ICD-10-CM | POA: Diagnosis not present

## 2020-09-11 DIAGNOSIS — E782 Mixed hyperlipidemia: Secondary | ICD-10-CM | POA: Diagnosis not present

## 2020-09-11 DIAGNOSIS — N390 Urinary tract infection, site not specified: Secondary | ICD-10-CM | POA: Diagnosis not present

## 2020-09-11 DIAGNOSIS — M150 Primary generalized (osteo)arthritis: Secondary | ICD-10-CM | POA: Diagnosis not present

## 2020-09-16 ENCOUNTER — Other Ambulatory Visit: Payer: Self-pay | Admitting: Internal Medicine

## 2020-09-16 DIAGNOSIS — Z0000 Encounter for general adult medical examination without abnormal findings: Secondary | ICD-10-CM

## 2020-09-18 DIAGNOSIS — N1832 Chronic kidney disease, stage 3b: Secondary | ICD-10-CM | POA: Diagnosis not present

## 2020-09-18 DIAGNOSIS — R600 Localized edema: Secondary | ICD-10-CM | POA: Diagnosis not present

## 2020-09-18 DIAGNOSIS — E559 Vitamin D deficiency, unspecified: Secondary | ICD-10-CM | POA: Diagnosis not present

## 2020-09-18 DIAGNOSIS — Z23 Encounter for immunization: Secondary | ICD-10-CM | POA: Diagnosis not present

## 2020-09-18 DIAGNOSIS — M150 Primary generalized (osteo)arthritis: Secondary | ICD-10-CM | POA: Diagnosis not present

## 2020-09-18 DIAGNOSIS — I129 Hypertensive chronic kidney disease with stage 1 through stage 4 chronic kidney disease, or unspecified chronic kidney disease: Secondary | ICD-10-CM | POA: Diagnosis not present

## 2020-09-18 DIAGNOSIS — E782 Mixed hyperlipidemia: Secondary | ICD-10-CM | POA: Diagnosis not present

## 2020-09-18 DIAGNOSIS — Z0000 Encounter for general adult medical examination without abnormal findings: Secondary | ICD-10-CM | POA: Diagnosis not present

## 2020-09-18 DIAGNOSIS — M5136 Other intervertebral disc degeneration, lumbar region: Secondary | ICD-10-CM | POA: Diagnosis not present

## 2020-09-30 DIAGNOSIS — M25511 Pain in right shoulder: Secondary | ICD-10-CM | POA: Diagnosis not present

## 2020-10-02 ENCOUNTER — Ambulatory Visit: Payer: Medicare PPO

## 2020-10-07 DIAGNOSIS — S46011D Strain of muscle(s) and tendon(s) of the rotator cuff of right shoulder, subsequent encounter: Secondary | ICD-10-CM | POA: Diagnosis not present

## 2020-10-07 DIAGNOSIS — M6281 Muscle weakness (generalized): Secondary | ICD-10-CM | POA: Diagnosis not present

## 2020-10-07 DIAGNOSIS — M25511 Pain in right shoulder: Secondary | ICD-10-CM | POA: Diagnosis not present

## 2020-11-12 ENCOUNTER — Other Ambulatory Visit: Payer: Self-pay

## 2020-11-12 ENCOUNTER — Ambulatory Visit
Admission: RE | Admit: 2020-11-12 | Discharge: 2020-11-12 | Disposition: A | Discharge status: Home / Self Care | Payer: Medicare PPO | Source: Ambulatory Visit | Attending: Internal Medicine | Admitting: Internal Medicine

## 2020-11-12 DIAGNOSIS — Z0000 Encounter for general adult medical examination without abnormal findings: Secondary | ICD-10-CM

## 2020-11-12 DIAGNOSIS — Z1231 Encounter for screening mammogram for malignant neoplasm of breast: Secondary | ICD-10-CM | POA: Diagnosis not present

## 2020-11-24 DIAGNOSIS — R059 Cough, unspecified: Secondary | ICD-10-CM | POA: Diagnosis not present

## 2020-12-24 DIAGNOSIS — Z85828 Personal history of other malignant neoplasm of skin: Secondary | ICD-10-CM | POA: Diagnosis not present

## 2020-12-24 DIAGNOSIS — C44629 Squamous cell carcinoma of skin of left upper limb, including shoulder: Secondary | ICD-10-CM | POA: Diagnosis not present

## 2020-12-24 DIAGNOSIS — L821 Other seborrheic keratosis: Secondary | ICD-10-CM | POA: Diagnosis not present

## 2020-12-24 DIAGNOSIS — L905 Scar conditions and fibrosis of skin: Secondary | ICD-10-CM | POA: Diagnosis not present

## 2020-12-24 DIAGNOSIS — L814 Other melanin hyperpigmentation: Secondary | ICD-10-CM | POA: Diagnosis not present

## 2020-12-24 DIAGNOSIS — D229 Melanocytic nevi, unspecified: Secondary | ICD-10-CM | POA: Diagnosis not present

## 2020-12-24 DIAGNOSIS — M542 Cervicalgia: Secondary | ICD-10-CM | POA: Diagnosis not present

## 2020-12-24 DIAGNOSIS — D485 Neoplasm of uncertain behavior of skin: Secondary | ICD-10-CM | POA: Diagnosis not present

## 2020-12-24 DIAGNOSIS — L570 Actinic keratosis: Secondary | ICD-10-CM | POA: Diagnosis not present

## 2020-12-28 DIAGNOSIS — M25511 Pain in right shoulder: Secondary | ICD-10-CM | POA: Diagnosis not present

## 2020-12-30 DIAGNOSIS — M25511 Pain in right shoulder: Secondary | ICD-10-CM | POA: Diagnosis not present

## 2021-01-01 DIAGNOSIS — N390 Urinary tract infection, site not specified: Secondary | ICD-10-CM | POA: Diagnosis not present

## 2021-01-01 DIAGNOSIS — Z01818 Encounter for other preprocedural examination: Secondary | ICD-10-CM | POA: Diagnosis not present

## 2021-01-02 DIAGNOSIS — H35371 Puckering of macula, right eye: Secondary | ICD-10-CM | POA: Diagnosis not present

## 2021-01-02 DIAGNOSIS — Z961 Presence of intraocular lens: Secondary | ICD-10-CM | POA: Diagnosis not present

## 2021-01-02 DIAGNOSIS — H52201 Unspecified astigmatism, right eye: Secondary | ICD-10-CM | POA: Diagnosis not present

## 2021-01-02 DIAGNOSIS — H524 Presbyopia: Secondary | ICD-10-CM | POA: Diagnosis not present

## 2021-01-07 DIAGNOSIS — R0602 Shortness of breath: Secondary | ICD-10-CM | POA: Diagnosis not present

## 2021-01-07 DIAGNOSIS — N1832 Chronic kidney disease, stage 3b: Secondary | ICD-10-CM | POA: Diagnosis not present

## 2021-01-07 DIAGNOSIS — R5383 Other fatigue: Secondary | ICD-10-CM | POA: Diagnosis not present

## 2021-01-07 DIAGNOSIS — R059 Cough, unspecified: Secondary | ICD-10-CM | POA: Diagnosis not present

## 2021-01-07 DIAGNOSIS — E782 Mixed hyperlipidemia: Secondary | ICD-10-CM | POA: Diagnosis not present

## 2021-01-07 DIAGNOSIS — J90 Pleural effusion, not elsewhere classified: Secondary | ICD-10-CM | POA: Diagnosis not present

## 2021-01-07 DIAGNOSIS — D649 Anemia, unspecified: Secondary | ICD-10-CM | POA: Diagnosis not present

## 2021-01-14 DIAGNOSIS — J90 Pleural effusion, not elsewhere classified: Secondary | ICD-10-CM | POA: Diagnosis not present

## 2021-01-23 DIAGNOSIS — R0602 Shortness of breath: Secondary | ICD-10-CM | POA: Diagnosis not present

## 2021-01-30 ENCOUNTER — Ambulatory Visit: Payer: Medicare PPO | Admitting: Pulmonary Disease

## 2021-01-30 ENCOUNTER — Encounter: Payer: Self-pay | Admitting: Pulmonary Disease

## 2021-01-30 ENCOUNTER — Other Ambulatory Visit: Payer: Self-pay

## 2021-01-30 VITALS — BP 124/82 | HR 84 | Temp 97.0°F | Ht 63.0 in | Wt 143.2 lb

## 2021-01-30 DIAGNOSIS — R0609 Other forms of dyspnea: Secondary | ICD-10-CM

## 2021-01-30 DIAGNOSIS — R0600 Dyspnea, unspecified: Secondary | ICD-10-CM | POA: Diagnosis not present

## 2021-01-30 DIAGNOSIS — J90 Pleural effusion, not elsewhere classified: Secondary | ICD-10-CM | POA: Diagnosis not present

## 2021-01-30 NOTE — Patient Instructions (Signed)
We will drain the fluid Friday February 18 at noon  IF we need to reschedule we will call you - I am in the hospital the whole next week after 02/07/2021 so can do it then worst case.

## 2021-01-31 DIAGNOSIS — I351 Nonrheumatic aortic (valve) insufficiency: Secondary | ICD-10-CM | POA: Diagnosis not present

## 2021-01-31 DIAGNOSIS — I129 Hypertensive chronic kidney disease with stage 1 through stage 4 chronic kidney disease, or unspecified chronic kidney disease: Secondary | ICD-10-CM | POA: Diagnosis not present

## 2021-01-31 DIAGNOSIS — R600 Localized edema: Secondary | ICD-10-CM | POA: Diagnosis not present

## 2021-01-31 DIAGNOSIS — I340 Nonrheumatic mitral (valve) insufficiency: Secondary | ICD-10-CM | POA: Diagnosis not present

## 2021-01-31 DIAGNOSIS — J90 Pleural effusion, not elsewhere classified: Secondary | ICD-10-CM | POA: Diagnosis not present

## 2021-02-04 DIAGNOSIS — Z85828 Personal history of other malignant neoplasm of skin: Secondary | ICD-10-CM | POA: Diagnosis not present

## 2021-02-04 DIAGNOSIS — L905 Scar conditions and fibrosis of skin: Secondary | ICD-10-CM | POA: Diagnosis not present

## 2021-02-06 ENCOUNTER — Telehealth: Payer: Self-pay

## 2021-02-06 NOTE — Telephone Encounter (Signed)
NOTES ON FILE FROM [facility] [phone], SENT REFERRAL TO SCHEDULING 

## 2021-02-07 ENCOUNTER — Other Ambulatory Visit (HOSPITAL_COMMUNITY)
Admission: RE | Admit: 2021-02-07 | Discharge: 2021-02-07 | Disposition: A | Discharge status: Home / Self Care | Payer: Medicare PPO | Attending: Pulmonary Disease | Admitting: Pulmonary Disease

## 2021-02-07 ENCOUNTER — Encounter: Payer: Self-pay | Admitting: Pulmonary Disease

## 2021-02-07 ENCOUNTER — Ambulatory Visit: Payer: Medicare PPO | Admitting: Pulmonary Disease

## 2021-02-07 ENCOUNTER — Other Ambulatory Visit: Payer: Self-pay

## 2021-02-07 ENCOUNTER — Other Ambulatory Visit (HOSPITAL_COMMUNITY)
Admission: RE | Admit: 2021-02-07 | Discharge: 2021-02-07 | Disposition: A | Discharge status: Home / Self Care | Payer: Medicare PPO | Source: Ambulatory Visit | Attending: Pulmonary Disease | Admitting: Pulmonary Disease

## 2021-02-07 VITALS — BP 130/74 | HR 93 | Temp 97.5°F | Ht 63.0 in | Wt 140.6 lb

## 2021-02-07 DIAGNOSIS — J948 Other specified pleural conditions: Secondary | ICD-10-CM | POA: Diagnosis not present

## 2021-02-07 DIAGNOSIS — J90 Pleural effusion, not elsewhere classified: Secondary | ICD-10-CM

## 2021-02-07 LAB — LACTATE DEHYDROGENASE, PLEURAL OR PERITONEAL FLUID: LD, Fluid: 70 U/L — ABNORMAL HIGH (ref 3–23)

## 2021-02-07 LAB — BODY FLUID CELL COUNT WITH DIFFERENTIAL
Eos, Fluid: 1 %
Lymphs, Fluid: 89 %
Monocyte-Macrophage-Serous Fluid: 8 % — ABNORMAL LOW (ref 50–90)
Neutrophil Count, Fluid: 2 % (ref 0–25)
Total Nucleated Cell Count, Fluid: 1329 cu mm — ABNORMAL HIGH (ref 0–1000)

## 2021-02-07 LAB — PROTEIN, PLEURAL OR PERITONEAL FLUID: Total protein, fluid: <3 g/dL

## 2021-02-07 LAB — ALBUMIN, PLEURAL OR PERITONEAL FLUID: Albumin, Fluid: 2.1 g/dL

## 2021-02-07 NOTE — Progress Notes (Signed)
Thoracentesis  Procedure Note  Emoni Whitworth  [id number]  November 07, 1932  Date:02/07/21  Time:1:13 PM   Provider Performing:Mylisa Brunson R Enisa Runyan   Procedure: Thoracentesis with imaging guidance (93734)  Indication(s) Pleural Effusion  Consent Risks of the procedure as well as the alternatives and risks of each were explained to the patient and/or caregiver.  Consent for the procedure was obtained verbally.  Anesthesia Topical only with 1% lidocaine    Time Out Verified patient identification, verified procedure, site/side was marked, verified correct patient position, special equipment/implants available, medications/allergies/relevant history reviewed, required imaging and test results available.   Sterile Technique Maximal sterile technique including full sterile barrier drape, hand hygiene, sterile gloves, mask, hair covering.  Procedure Description Ultrasound was used to identify appropriate pleural anatomy for placement and overlying skin marked.  Area of drainage cleaned and draped in sterile fashion. Lidocaine was used to anesthetize the skin and subcutaneous tissue.  1,000 cc's of serous appearing fluid was drained from the left pleural space. Procedure terminated due to patient symptoms of upper chest pressure.Catheter then removed and bandaid applied to site.   Complications/Tolerance None; patient tolerated the procedure well.   EBL none   Specimen(s) Pleural fluid  Micro, chemistry, cell count, cytology

## 2021-02-07 NOTE — Progress Notes (Signed)
Patient ID: Jillian Cooley, female    DOB: Jan 29, 1932, 87 y.o.   MRN: [id number]  Chief Complaint  Patient presents with  . Consult    Referred by PCP for pleural effusion on left side around 45 days ago. Increased SOB and coughing were her only symptoms.     Referring provider: [name], MD  HPI:   87 year old woman whom we are seeing in consultation for evaluation of pleural effusion. Note from referring provider reviewed.  Patient noted gradual onset of dyspnea on exertion over the last several weeks. This is associated with lower extremity swelling. She endorses mainly dyspnea going up inclines or stairs but can occur on flat surfaces. Also notes difficulty lying flat, sleeping more upright. Severity is described as severe. No alleviating or exacerbating factors otherwise. No timing during the day when things are better or worse. No seasonal variation that she can tell. No environmental factors that she can identify the mechanics better or worse. No medicines have been changed, no real intervention prior to seeing me in terms of trying to improve her symptoms.  Work-up included chest x-ray December 30, 2020 that personally reviewed and on my interpretation demonstrates bilateral pleural effusions with left larger than right. Additional work-up including echocardiogram. This was reviewed. This demonstrates aortic and mitral valve insufficiency with dilated LA.  PMH: Hypertension Surgical history: Cervical and lumbar the surgery Family history: She denies any significant history of respiratory or cardiac issues in first-degree relatives Social history: Lives in [place], married, never smoker  Licensed conveyancer / Pulmonary Flowsheets:   ACT:  No flowsheet data found.  MMRC: mMRC Dyspnea Scale mMRC Score  01/30/2021 1    Epworth:  No flowsheet data found.  Tests:   FENO:  No results found for: NITRICOXIDE  PFT: No flowsheet data found.  WALK:  No  flowsheet data found.  Imaging: Personally reviewed and as per EMR discussion of this note  Lab Results: Personally reviewed CBC    Component Value Date/Time   WBC 5.3 06/08/2012 1001   RBC 4.16 06/08/2012 1001   HGB 12.7 06/08/2012 1001   HCT 36.8 06/08/2012 1001   PLT 163 06/08/2012 1001   MCV 88.5 06/08/2012 1001   MCH 30.5 06/08/2012 1001   MCHC 34.5 06/08/2012 1001   RDW 12.2 06/08/2012 1001    BMET    Component Value Date/Time   NA 138 06/08/2012 1001   K 3.7 06/08/2012 1001   CL 100 06/08/2012 1001   CO2 30 06/08/2012 1001   GLUCOSE 95 06/08/2012 1001   BUN 22 06/08/2012 1001   CREATININE 1.26 (H) 06/08/2012 1001   CALCIUM 9.4 06/08/2012 1001   GFRNONAA 40 (L) 06/08/2012 1001   GFRAA 46 (L) 06/08/2012 1001    BNP No results found for: BNP  ProBNP No results found for: PROBNP  Specialty Problems   None     Allergies  Allergen Reactions  . Sulfa Antibiotics Nausea Only    Immunization History  Administered Date(s) Administered  . Influenza-Unspecified 10/21/2018  . PFIZER(Purple Top)SARS-COV-2 Vaccination 01/09/2020, 01/29/2020    Past Medical History:  Diagnosis Date  . Hypertension   . Skin cancer     Tobacco History: Social History   Tobacco Use  Smoking Status Never Smoker  Smokeless Tobacco Never Used   Counseling given: Not Answered   Continue to not smoke  Outpatient Encounter Medications as of 01/30/2021  Medication Sig  . amLODipine (NORVASC) 5 MG tablet Take 1 tablet by mouth daily.  Marland Kitchen  Calcium Carb-Cholecalciferol 600-200 MG-UNIT TABS 1 tablet with food  . losartan (COZAAR) 25 MG tablet 1 tablet  . Multiple Vitamins-Minerals (PRESERVISION AREDS 2+MULTI VIT PO)   . simvastatin (ZOCOR) 20 MG tablet   . [DISCONTINUED] CALCIUM-VITAMIN D PO Take 1 tablet by mouth daily.  . [DISCONTINUED] lovastatin (MEVACOR) 20 MG tablet Take 20 mg by mouth at bedtime.  . [DISCONTINUED] Multiple Vitamins-Minerals (ICAPS AREDS FORMULA PO)  Take 1 capsule by mouth daily.  . [DISCONTINUED] triamterene-hydrochlorothiazide (MAXZIDE-25) 37.5-25 MG per tablet Take 1 tablet by mouth daily.   No facility-administered encounter medications on file as of 01/30/2021.     Review of Systems  Review of Systems  No chest pain with exertion. Orthopnea as above. No PND. Comprehensive review of systems otherwise negative. Physical Exam  BP 124/82   Pulse 84   Temp (!) 97 F (36.1 C) (Temporal)   Ht 5\' 3"  (1.6 m)   Wt 143 lb 3.2 oz (65 kg)   SpO2 96% Comment: on RA  BMI 25.37 kg/m   Wt Readings from Last 5 Encounters:  01/30/21 143 lb 3.2 oz (65 kg)  06/08/12 139 lb 1.8 oz (63.1 kg)    BMI Readings from Last 5 Encounters:  01/30/21 25.37 kg/m  06/08/12 24.64 kg/m     Physical Exam General: Well-appearing, no acute distress Eyes: EOMI, icterus Neck: Supple, JVP a few cm above clavicle with head of bed 80 degrees Respiratory: Diminished in bilateral bases, diminishment continues to mid lung field on left, mildly for crackles Cardiovascular: Regular rhythm, no murmurs noted Abdomen: Nondistended, bowel sounds present MSK: No synovitis, no joint effusion Neuro: Normal gait, no weakness Psych: No mood, full affect   Assessment & Plan:   Dyspnea on exertion: Related to pleural effusion likely. Suspect pulmonary edema as well given crackles on exam, bilateral pleural effusions, likely swelling. Echocardiogram supports left atrial hypertension.  Pleural effusions: Left greater than right. Suspect cardiogenic in nature. Will pursue thoracentesis for therapeutic and diagnostic purposes in the coming days.   Return if symptoms worsen or fail to improve.   Lanier Clam, MD 02/07/2021

## 2021-02-07 NOTE — Patient Instructions (Signed)
I hope this helps the breathing  I will send off tests on the fluid  Return to clinic as needed  Notification of test results are managed in the following manner: If there are  any recommendations or changes to the  plan of care discussed in office today,  we will contact you and let you know what they are. If you do not hear from [place], then your results are normal and you can view them through your  MyChart account , or a letter will be sent to you. Thank you again for trusting [place] with your care   Pulmonary.

## 2021-02-07 NOTE — Progress Notes (Signed)
Patient ID: Jillian Cooley, female    DOB: 10-22-1932, 87 y.o.   MRN: [id number]  Chief Complaint  Patient presents with  . Follow-up    In office thoracentesis     Referring provider: [name], MD  HPI:   87 year old woman whom we are seeing in follow up for evaluation of pleural effusion.  Breathing the same. Still short winded. Met with PCP about TTE results. Referred to cardiology. No lasix or other medications started. Explained in detail results of TTE and role it likely plays in accumulation of pleural fluid, symptoms. Thoracentesis today after verbal consent. Hypoechoic, simple. Serous. Tolerated well. Stopped due to mild chest pressure on left.   HPI at initial visit:  Patient noted gradual onset of dyspnea on exertion over the last several weeks. This is associated with lower extremity swelling. She endorses mainly dyspnea going up inclines or stairs but can occur on flat surfaces. Also notes difficulty lying flat, sleeping more upright. Severity is described as severe. No alleviating or exacerbating factors otherwise. No timing during the day when things are better or worse. No seasonal variation that she can tell. No environmental factors that she can identify the mechanics better or worse. No medicines have been changed, no real intervention prior to seeing me in terms of trying to improve her symptoms.  Work-up included chest x-ray December 30, 2020 that personally reviewed and on my interpretation demonstrates bilateral pleural effusions with left larger than right. Additional work-up including echocardiogram. This was reviewed. This demonstrates aortic and mitral valve insufficiency with dilated LA.  PMH: Hypertension Surgical history: Cervical and lumbar the surgery Family history: She denies any significant history of respiratory or cardiac issues in first-degree relatives Social history: Lives in [place], married, never smoker  Licensed conveyancer /  Pulmonary Flowsheets:   ACT:  No flowsheet data found.  MMRC: mMRC Dyspnea Scale mMRC Score  01/30/2021 1    Epworth:  No flowsheet data found.  Tests:   FENO:  No results found for: NITRICOXIDE  PFT: No flowsheet data found.  WALK:  No flowsheet data found.  Imaging: Personally reviewed and as per EMR discussion of this note  Lab Results: Personally reviewed CBC    Component Value Date/Time   WBC 5.3 06/08/2012 1001   RBC 4.16 06/08/2012 1001   HGB 12.7 06/08/2012 1001   HCT 36.8 06/08/2012 1001   PLT 163 06/08/2012 1001   MCV 88.5 06/08/2012 1001   MCH 30.5 06/08/2012 1001   MCHC 34.5 06/08/2012 1001   RDW 12.2 06/08/2012 1001    BMET    Component Value Date/Time   NA 138 06/08/2012 1001   K 3.7 06/08/2012 1001   CL 100 06/08/2012 1001   CO2 30 06/08/2012 1001   GLUCOSE 95 06/08/2012 1001   BUN 22 06/08/2012 1001   CREATININE 1.26 (H) 06/08/2012 1001   CALCIUM 9.4 06/08/2012 1001   GFRNONAA 40 (L) 06/08/2012 1001   GFRAA 46 (L) 06/08/2012 1001    BNP No results found for: BNP  ProBNP No results found for: PROBNP  Specialty Problems   None     Allergies  Allergen Reactions  . Sulfa Antibiotics Nausea Only    Immunization History  Administered Date(s) Administered  . Influenza-Unspecified 10/21/2018  . PFIZER(Purple Top)SARS-COV-2 Vaccination 01/09/2020, 01/29/2020    Past Medical History:  Diagnosis Date  . Hypertension   . Skin cancer     Tobacco History: Social History   Tobacco Use  Smoking Status Never  Smoker  Smokeless Tobacco Never Used   Counseling given: Not Answered   Continue to not smoke  Outpatient Encounter Medications as of 02/07/2021  Medication Sig  . amLODipine (NORVASC) 5 MG tablet Take 1 tablet by mouth daily.  . Calcium Carb-Cholecalciferol 600-200 MG-UNIT TABS 1 tablet with food  . furosemide (LASIX) 20 MG tablet 1 tablet, in a.m., with 1 glass of orange juice or a banana daily  . losartan  (COZAAR) 25 MG tablet 1 tablet  . Multiple Vitamins-Minerals (PRESERVISION AREDS 2+MULTI VIT PO)   . simvastatin (ZOCOR) 20 MG tablet    No facility-administered encounter medications on file as of 02/07/2021.     Review of Systems  Review of Systems  No chest pain with exertion. Orthopnea as above. No PND. Comprehensive review of systems otherwise negative. Physical Exam  BP 130/74   Pulse 93   Temp (!) 97.5 F (36.4 C) (Temporal)   Ht $R'5\' 3"'vS$  (1.6 m)   Wt 140 lb 9.6 oz (63.8 kg)   SpO2 98% Comment: on RA  BMI 24.91 kg/m   Wt Readings from Last 5 Encounters:  02/07/21 140 lb 9.6 oz (63.8 kg)  01/30/21 143 lb 3.2 oz (65 kg)  06/08/12 139 lb 1.8 oz (63.1 kg)    BMI Readings from Last 5 Encounters:  02/07/21 24.91 kg/m  01/30/21 25.37 kg/m  06/08/12 24.64 kg/m     Physical Exam General: Well-appearing, no acute distress Eyes: EOMI, icterus Respiratory: Diminished in bilateral bases Cardiovascular: Regular rhythm, no murmurs noted Abdomen: Nondistended, bowel sounds present MSK: No synovitis, no joint effusion Neuro: Normal gait, no weakness Psych: No mood, full affect   Assessment & Plan:   Dyspnea on exertion: Related to pleural effusion likely. Suspect pulmonary edema as well given crackles on exam, bilateral pleural effusions, likely swelling. Echocardiogram supports left atrial hypertension. Eval sympotms after thoracentesis  Pleural effusions: Left greater than right. Suspect cardiogenic in nature. Diagnostic and therapeutic thoracentesis 2/18. Serous. 1,000 mL removed. Pleural fluid studies sent.    Return if symptoms worsen or fail to improve.   Lanier Clam, MD 02/07/2021

## 2021-02-09 LAB — TRIGLYCERIDES, BODY FLUIDS: Triglycerides, Fluid: 12 mg/dL

## 2021-02-10 LAB — CYTOLOGY - NON PAP

## 2021-02-11 LAB — BODY FLUID CULTURE W GRAM STAIN: Culture: NO GROWTH

## 2021-02-21 ENCOUNTER — Ambulatory Visit: Payer: Medicare PPO | Admitting: Internal Medicine

## 2021-02-21 ENCOUNTER — Encounter: Payer: Self-pay | Admitting: Internal Medicine

## 2021-02-21 ENCOUNTER — Other Ambulatory Visit: Payer: Self-pay

## 2021-02-21 VITALS — BP 158/78 | HR 90 | Ht 63.0 in | Wt 139.6 lb

## 2021-02-21 DIAGNOSIS — I361 Nonrheumatic tricuspid (valve) insufficiency: Secondary | ICD-10-CM

## 2021-02-21 DIAGNOSIS — I10 Essential (primary) hypertension: Secondary | ICD-10-CM | POA: Diagnosis not present

## 2021-02-21 DIAGNOSIS — Z01810 Encounter for preprocedural cardiovascular examination: Secondary | ICD-10-CM | POA: Diagnosis not present

## 2021-02-21 DIAGNOSIS — I340 Nonrheumatic mitral (valve) insufficiency: Secondary | ICD-10-CM | POA: Diagnosis not present

## 2021-02-21 DIAGNOSIS — I351 Nonrheumatic aortic (valve) insufficiency: Secondary | ICD-10-CM | POA: Diagnosis not present

## 2021-02-21 MED ORDER — FUROSEMIDE 40 MG PO TABS: 60.0000 mg | ORAL_TABLET | Freq: Every day | ORAL | 3 refills | Status: DC

## 2021-02-21 NOTE — Progress Notes (Addendum)
Cardiology Office Note:    Date:  02/21/2021   ID:  Jillian Cooley, DOB 07-Apr-1932, MRN [id number]  PCP:  [name], [place]  Cardiologist:  No primary care provider on file.  Advanced Practice Provider:  No care team member to display Electrophysiologist:  None       CC: Surgical Eval R Rotator Cuff Anesthesia unknown Dr. French Ana Consulted for the evaluation of mitral regurgitation at the behest of [name], MD   History of Present Illness:    Jillian Cooley is a 87 y.o. female with a hx of Carpal Tunnel Syndrome; HTN, HLD, aortic regurgitation, mitral regurgitation tricuspid regurgitation who presents for evaluation 02/21/21.  Patient notes that she is feeling pretty good.  Patient notes that she has had persistent shoulder pain:  Started getting shoulder injection injections with some improvement.  Found to have a R torn rotator cuff.  Had primary evaluation for surgery and found to be short of breat: CXR had pleural effusions.  S/p Thoracentesis (1L) with improvement in SOB.  Pleural studies looked ok.  Has had no chest pain, chest pressure, chest tightness, chest stinging.  Discomfort occurs as shortness of breath with exertion,  With significant activity (post thoracentesis) .  Patient exertion notable for doing chores and feels no symptoms.  No shortness of breath at rest.  No PND or orthopnea.  No bendopnea, weight gain, notes ankle welling , but no abdominal swelling.  No syncope or near syncope. Notes  no palpitations or funny heart beats.     No history of pre-eclampsia.    Ambulatory BP 140/70.   Past Medical History:  Diagnosis Date  . Abnormal EKG   . Carpal tunnel syndrome   . CKD (chronic kidney disease), stage III ([place])   . DDD (degenerative disc disease), lumbar   . Hypertension   . Hypertension with renal disease   . Menopause   . Mixed hyperlipidemia   . Muscular degeneration   . Nonrheumatic  aortic (valve) insufficiency   . Nonrheumatic mitral valve regurgitation   . Osteoarthritis   . Pedal edema   . Pleural effusion   . Skin cancer   . Vitamin D deficiency     Past Surgical History:  Procedure Laterality Date  . ANTERIOR CERVICAL DECOMP/DISCECTOMY FUSION  2009  . LUMBAR LAMINECTOMY/DECOMPRESSION MICRODISCECTOMY  06/13/2012   Procedure: LUMBAR LAMINECTOMY/DECOMPRESSION MICRODISCECTOMY 1 LEVEL;  Surgeon: Ophelia Charter, MD;  Location: [facility];  Service: Neurosurgery;  Laterality: Right;  RIGHT Lumbar two-three diskectomy    Current Medications: Current Meds  Medication Sig  . amLODipine (NORVASC) 5 MG tablet Take 1 tablet by mouth daily.  . Calcium Carb-Cholecalciferol 600-200 MG-UNIT TABS 1 tablet with food  . furosemide (LASIX) 40 MG tablet Take 1.5 tablets (60 mg total) by mouth daily.  Marland Kitchen losartan (COZAAR) 25 MG tablet Take 25 mg by mouth daily.  . Multiple Vitamins-Minerals (PRESERVISION AREDS 2+MULTI VIT PO) Take 1 tablet by mouth 2 (two) times daily.  . simvastatin (ZOCOR) 20 MG tablet Take 20 mg by mouth daily.  . [DISCONTINUED] furosemide (LASIX) 20 MG tablet Take 20 mg by mouth.     Allergies:   Sulfa antibiotics   Social History   Socioeconomic History  . Marital status: Married    Spouse name: Not on file  . Number of children: Not on file  . Years of education: Not on file  . Highest education level: Not on file  Occupational History  . Not on file  Tobacco Use  . Smoking status: Never Smoker  . Smokeless tobacco: Never Used  Substance and Sexual Activity  . Alcohol use: No  . Drug use: No  . Sexual activity: Not on file  Other Topics Concern  . Not on file  Social History Narrative  . Not on file   Social Determinants of Health   Financial Resource Strain: Not on file  Food Insecurity: Not on file  Transportation Needs: Not on file  Physical Activity: Not on file  Stress: Not on file  Social Connections: Not on file      Family History: [name] family history is negative for Breast cancer. History of coronary artery disease notable for no members. History of heart failure notable for no members. History of arrhythmia notable for no members.  ROS:   Please see the history of present illness.     All other systems reviewed and are negative.  EKGs/Labs/Other Studies Reviewed:    The following studies were reviewed today:  EKG:   02/21/21: SR 90 Ant Inf pattern  Transthoracic Echocardiogram: Date: 01/23/21 Results: report only Normal LV size, mild concentric hypertrophy EF 65- 70% Moderately dilated LA Moderate TR Moderate MR Mild to Moderate AI Addendum:  Full report received Normal Aortic Size  Recent Labs: No results found for requested labs within last 8760 hours.  Recent Lipid Panel No results found for: CHOL, TRIG, HDL, CHOLHDL, VLDL, LDLCALC, LDLDIRECT   Risk Assessment/Calculations:     N/A  Physical Exam:    VS:  BP (!) 158/78   Pulse 90   Ht 5\' 3"  (1.6 m)   Wt 139 lb 9.6 oz (63.3 kg)   SpO2 96%   BMI 24.73 kg/m     Wt Readings from Last 3 Encounters:  02/21/21 139 lb 9.6 oz (63.3 kg)  02/07/21 140 lb 9.6 oz (63.8 kg)  01/30/21 143 lb 3.2 oz (65 kg)    GEN:  Well nourished, well developed in no acute distress HEENT: Normal NECK: No JVD; No carotid bruits LYMPHATICS: No lymphadenopathy CARDIAC: RRR, II/Vi disatolic rumble and III/VI holostolic murmur no rubs, gallops RESPIRATORY:  Clear to auscultation without rales, wheezing or rhonchi  ABDOMEN: Soft, non-tender, non-distended MUSCULOSKELETAL:  No edema; No deformity  SKIN: Warm and dry NEUROLOGIC:  Alert and oriented x 3 PSYCHIATRIC:  Normal affect   ASSESSMENT:    1. Nonrheumatic mitral valve regurgitation   2. Nonrheumatic tricuspid valve regurgitation   3. Nonrheumatic aortic valve insufficiency   4. Preoperative cardiovascular examination   5. Essential hypertension    PLAN:    In order of  problems listed above:  Preoperative Risk Assessment - The Revised Cardiac Risk Index =  1 (CHF- equivalent)  1=0.9%: low risk of perioperative myocardial infarction, pulmonary edema, ventricular fibrillation, cardiac arrest, or complete heart block.  - DASI score of 13.45 associated with 4.4 functional mets - No further cardiac testing is recommended prior to surgery.  - The patient may proceed to surgery at acceptable risk:  Discussed risk of HF decompensation perioopratively   Moderate Mitral regurgitation Moderate Tricuspid regurgitation Moderate Aortic regurgitation CKD Stage IIIa - NYHA class II, Stage B, hypervolemic, etiology unclear - Diuretic regimen: Lasix increase to 60 mg PO daily - Discussed the importance of fluid restriction of < 2 L, salt restriction, and checking daily weights  - One week BMP/BNP/Mg  - will get old echo images; repeat and echo in Summer or  fall  Essential Hypertension - ambulatory blood pressure 130-140, will continue ambulatory BP monitoring; gave education on how to perform ambulatory blood pressure monitoring including the frequency and technique; goal ambulatory blood pressure < 135/85 on average - continue home medications with the exception of diuretic - discussed diet (DASH/low sodium), and exercise/weight loss interventions   Summer follow up unless new symptoms or abnormal test results warranting change in plan  Time Spent Directly with Patient:   I have spent a total of 60 minutes with the patient reviewing  notes,  EKGs, labs and examining the patient as well as establishing an assessment and plan that was discussed personally with the patient.  > 50% of time was spent in direct patient care- discussing with patient, son, and husband risks and benefits of surgery and using models to discuss valve disease.   Medication Adjustments/Labs and Tests Ordered: Current medicines are reviewed at length with the patient today.  Concerns regarding  medicines are outlined above.  Orders Placed This Encounter  Procedures  . Basic metabolic panel  . Magnesium  . Pro b natriuretic peptide (BNP)  . EKG 12-Lead  . ECHOCARDIOGRAM COMPLETE   Meds ordered this encounter  Medications  . furosemide (LASIX) 40 MG tablet    Sig: Take 1.5 tablets (60 mg total) by mouth daily.    Dispense:  135 tablet    Refill:  3    Patient Instructions  Medication Instructions:  Your physician has recommended you make the following change in your medication:   INCREASE: furosemide (Lasix) to 60 mg by mouth daily *If you need a refill on your cardiac medications before your next appointment, please call your pharmacy*   Lab Work: IN 1 WEEK: BNP, BMP, and Mg If you have labs (blood work) drawn today and your tests are completely normal, you will receive your results only by: Marland Kitchen MyChart Message (if you have MyChart) OR . A paper copy in the mail If you have any lab test that is abnormal or we need to change your treatment, we will call you to review the results.   Testing/Procedures: Your physician has requested that you have an echocardiogram in 5 months. Echocardiography is a painless test that uses sound waves to create images of your heart. It provides your doctor with information about the size and shape of your heart and how well your heart's chambers and valves are working. This procedure takes approximately one hour. There are no restrictions for this procedure.     Follow-Up: At [facility], you and your health needs are our priority.  As part of our continuing mission to provide you with exceptional heart care, we have created designated Provider Care Teams.  These Care Teams include your primary Cardiologist (physician) and Advanced Practice Providers (APPs -  Physician Assistants and Nurse Practitioners) who all work together to provide you with the care you need, when you need it.  We recommend signing up for the patient portal called  "MyChart".  Sign up information is provided on this After Visit Summary.  MyChart is used to connect with patients for Virtual Visits (Telemedicine).  Patients are able to view lab/test results, encounter notes, upcoming appointments, etc.  Non-urgent messages can be sent to your provider as well.   To learn more about what you can do with MyChart, go to [email or web address].    Your next appointment:   6 month(s)  The format for your next appointment:   In Person  Provider:  You may see Gasper Sells, MD or one of the following Advanced Practice Providers on your designated Care Team:    Melina Copa, PA-C  Ermalinda Barrios, PA-C        Signed, Werner Lean, MD  02/21/2021 3:12 PM    [place]

## 2021-02-21 NOTE — Patient Instructions (Signed)
Medication Instructions:  Your physician has recommended you make the following change in your medication:   INCREASE: furosemide (Lasix) to 60 mg by mouth daily *If you need a refill on your cardiac medications before your next appointment, please call your pharmacy*   Lab Work: IN 1 WEEK: BNP, BMP, and Mg If you have labs (blood work) drawn today and your tests are completely normal, you will receive your results only by: Marland Kitchen MyChart Message (if you have MyChart) OR . A paper copy in the mail If you have any lab test that is abnormal or we need to change your treatment, we will call you to review the results.   Testing/Procedures: Your physician has requested that you have an echocardiogram in 5 months. Echocardiography is a painless test that uses sound waves to create images of your heart. It provides your doctor with information about the size and shape of your heart and how well your heart's chambers and valves are working. This procedure takes approximately one hour. There are no restrictions for this procedure.     Follow-Up: At [facility], you and your health needs are our priority.  As part of our continuing mission to provide you with exceptional heart care, we have created designated Provider Care Teams.  These Care Teams include your primary Cardiologist (physician) and Advanced Practice Providers (APPs -  Physician Assistants and Nurse Practitioners) who all work together to provide you with the care you need, when you need it.  We recommend signing up for the patient portal called "MyChart".  Sign up information is provided on this After Visit Summary.  MyChart is used to connect with patients for Virtual Visits (Telemedicine).  Patients are able to view lab/test results, encounter notes, upcoming appointments, etc.  Non-urgent messages can be sent to your provider as well.   To learn more about what you can do with MyChart, go to [email or web address].    Your next  appointment:   6 month(s)  The format for your next appointment:   In Person  Provider:   You may see Gasper Sells, MD or one of the following Advanced Practice Providers on your designated Care Team:    Melina Copa, PA-C  Ermalinda Barrios, PA-C

## 2021-02-28 ENCOUNTER — Other Ambulatory Visit: Payer: Self-pay

## 2021-02-28 ENCOUNTER — Other Ambulatory Visit: Payer: Medicare PPO | Admitting: *Deleted

## 2021-02-28 DIAGNOSIS — I361 Nonrheumatic tricuspid (valve) insufficiency: Secondary | ICD-10-CM | POA: Diagnosis not present

## 2021-02-28 DIAGNOSIS — I351 Nonrheumatic aortic (valve) insufficiency: Secondary | ICD-10-CM | POA: Diagnosis not present

## 2021-02-28 DIAGNOSIS — I340 Nonrheumatic mitral (valve) insufficiency: Secondary | ICD-10-CM | POA: Diagnosis not present

## 2021-02-28 DIAGNOSIS — I10 Essential (primary) hypertension: Secondary | ICD-10-CM | POA: Diagnosis not present

## 2021-03-01 LAB — BASIC METABOLIC PANEL
BUN/Creatinine Ratio: 32 — ABNORMAL HIGH (ref 12–28)
BUN: 60 mg/dL — ABNORMAL HIGH (ref 8–27)
CO2: 20 mmol/L (ref 20–29)
Calcium: 9.7 mg/dL (ref 8.7–10.3)
Chloride: 102 mmol/L (ref 96–106)
Creatinine, Ser: 1.85 mg/dL — ABNORMAL HIGH (ref 0.57–1.00)
Glucose: 95 mg/dL (ref 65–99)
Potassium: 4.5 mmol/L (ref 3.5–5.2)
Sodium: 140 mmol/L (ref 134–144)
eGFR: 26 mL/min/{1.73_m2} — ABNORMAL LOW (ref >59)

## 2021-03-01 LAB — MAGNESIUM: Magnesium: 2 mg/dL (ref 1.6–2.3)

## 2021-03-01 LAB — PRO B NATRIURETIC PEPTIDE: NT-Pro BNP: 1545 pg/mL — ABNORMAL HIGH (ref 0–738)

## 2021-03-03 ENCOUNTER — Telehealth: Payer: Self-pay

## 2021-03-03 DIAGNOSIS — I340 Nonrheumatic mitral (valve) insufficiency: Secondary | ICD-10-CM

## 2021-03-03 DIAGNOSIS — I361 Nonrheumatic tricuspid (valve) insufficiency: Secondary | ICD-10-CM

## 2021-03-03 DIAGNOSIS — I10 Essential (primary) hypertension: Secondary | ICD-10-CM

## 2021-03-03 NOTE — Telephone Encounter (Signed)
-----   Message from Werner Lean, MD sent at 03/02/2021  4:15 PM EDT ----- Results: Elevated kidney numbers and BNP (though WNL) Plan: Continue dosing; BMP in 3 weeks  Werner Lean, MD

## 2021-03-03 NOTE — Telephone Encounter (Signed)
Notified patient of test results she verbalizes understanding, all questions answered.  Scheduled 3 week fu labs for 03/25/21.

## 2021-03-17 DIAGNOSIS — H35363 Drusen (degenerative) of macula, bilateral: Secondary | ICD-10-CM | POA: Diagnosis not present

## 2021-03-17 DIAGNOSIS — H353114 Nonexudative age-related macular degeneration, right eye, advanced atrophic with subfoveal involvement: Secondary | ICD-10-CM | POA: Diagnosis not present

## 2021-03-17 DIAGNOSIS — Z961 Presence of intraocular lens: Secondary | ICD-10-CM | POA: Diagnosis not present

## 2021-03-17 DIAGNOSIS — H43813 Vitreous degeneration, bilateral: Secondary | ICD-10-CM | POA: Diagnosis not present

## 2021-03-17 DIAGNOSIS — H35453 Secondary pigmentary degeneration, bilateral: Secondary | ICD-10-CM | POA: Diagnosis not present

## 2021-03-17 DIAGNOSIS — H353122 Nonexudative age-related macular degeneration, left eye, intermediate dry stage: Secondary | ICD-10-CM | POA: Diagnosis not present

## 2021-03-17 DIAGNOSIS — H35373 Puckering of macula, bilateral: Secondary | ICD-10-CM | POA: Diagnosis not present

## 2021-03-25 ENCOUNTER — Other Ambulatory Visit: Payer: Self-pay

## 2021-03-25 ENCOUNTER — Other Ambulatory Visit: Payer: Medicare PPO

## 2021-03-25 DIAGNOSIS — I361 Nonrheumatic tricuspid (valve) insufficiency: Secondary | ICD-10-CM | POA: Diagnosis not present

## 2021-03-25 DIAGNOSIS — I340 Nonrheumatic mitral (valve) insufficiency: Secondary | ICD-10-CM | POA: Diagnosis not present

## 2021-03-25 DIAGNOSIS — I10 Essential (primary) hypertension: Secondary | ICD-10-CM | POA: Diagnosis not present

## 2021-03-25 LAB — BASIC METABOLIC PANEL
BUN/Creatinine Ratio: 33 — ABNORMAL HIGH (ref 12–28)
BUN: 65 mg/dL — ABNORMAL HIGH (ref 8–27)
CO2: 21 mmol/L (ref 20–29)
Calcium: 9.6 mg/dL (ref 8.7–10.3)
Chloride: 103 mmol/L (ref 96–106)
Creatinine, Ser: 1.97 mg/dL — ABNORMAL HIGH (ref 0.57–1.00)
Glucose: 100 mg/dL — ABNORMAL HIGH (ref 65–99)
Potassium: 4.7 mmol/L (ref 3.5–5.2)
Sodium: 140 mmol/L (ref 134–144)
eGFR: 24 mL/min/{1.73_m2} — ABNORMAL LOW (ref >59)

## 2021-03-25 NOTE — Telephone Encounter (Signed)
I have faxed over office note from Dr. Gasper Sells giving clearance. Faxed to [phone] to Dr. French Ana

## 2021-03-26 NOTE — Progress Notes (Signed)
Letter sent to PCP requesting BMP lab draw at her next office visit on 04/08/21.

## 2021-04-08 ENCOUNTER — Other Ambulatory Visit: Payer: Self-pay | Admitting: *Deleted

## 2021-04-08 DIAGNOSIS — I129 Hypertensive chronic kidney disease with stage 1 through stage 4 chronic kidney disease, or unspecified chronic kidney disease: Secondary | ICD-10-CM | POA: Diagnosis not present

## 2021-04-08 DIAGNOSIS — M150 Primary generalized (osteo)arthritis: Secondary | ICD-10-CM | POA: Diagnosis not present

## 2021-04-08 DIAGNOSIS — M5136 Other intervertebral disc degeneration, lumbar region: Secondary | ICD-10-CM | POA: Diagnosis not present

## 2021-04-08 DIAGNOSIS — E782 Mixed hyperlipidemia: Secondary | ICD-10-CM | POA: Diagnosis not present

## 2021-04-08 DIAGNOSIS — R7989 Other specified abnormal findings of blood chemistry: Secondary | ICD-10-CM

## 2021-04-08 DIAGNOSIS — N1832 Chronic kidney disease, stage 3b: Secondary | ICD-10-CM | POA: Diagnosis not present

## 2021-04-08 DIAGNOSIS — E559 Vitamin D deficiency, unspecified: Secondary | ICD-10-CM | POA: Diagnosis not present

## 2021-04-10 ENCOUNTER — Other Ambulatory Visit: Payer: Self-pay

## 2021-04-10 ENCOUNTER — Other Ambulatory Visit: Payer: Medicare PPO

## 2021-04-10 DIAGNOSIS — R7989 Other specified abnormal findings of blood chemistry: Secondary | ICD-10-CM | POA: Diagnosis not present

## 2021-04-10 LAB — BASIC METABOLIC PANEL
BUN/Creatinine Ratio: 23 (ref 12–28)
BUN: 39 mg/dL — ABNORMAL HIGH (ref 8–27)
CO2: 20 mmol/L (ref 20–29)
Calcium: 9.4 mg/dL (ref 8.7–10.3)
Chloride: 103 mmol/L (ref 96–106)
Creatinine, Ser: 1.7 mg/dL — ABNORMAL HIGH (ref 0.57–1.00)
Glucose: 90 mg/dL (ref 65–99)
Potassium: 4.9 mmol/L (ref 3.5–5.2)
Sodium: 138 mmol/L (ref 134–144)
eGFR: 29 mL/min/{1.73_m2} — ABNORMAL LOW (ref >59)

## 2021-04-15 DIAGNOSIS — E782 Mixed hyperlipidemia: Secondary | ICD-10-CM | POA: Diagnosis not present

## 2021-04-15 DIAGNOSIS — N1832 Chronic kidney disease, stage 3b: Secondary | ICD-10-CM | POA: Diagnosis not present

## 2021-04-15 DIAGNOSIS — I340 Nonrheumatic mitral (valve) insufficiency: Secondary | ICD-10-CM | POA: Diagnosis not present

## 2021-04-15 DIAGNOSIS — I129 Hypertensive chronic kidney disease with stage 1 through stage 4 chronic kidney disease, or unspecified chronic kidney disease: Secondary | ICD-10-CM | POA: Diagnosis not present

## 2021-04-15 DIAGNOSIS — R600 Localized edema: Secondary | ICD-10-CM | POA: Diagnosis not present

## 2021-04-16 ENCOUNTER — Ambulatory Visit: Payer: Self-pay | Admitting: Physician Assistant

## 2021-04-16 NOTE — H&P (View-Only) (Signed)
Jillian Cooley is an 87 y.o. female.   Chief Complaint: right shoulder pain HPI: She does have a full thickness tear with retraction.  She has tried PT and injection without relief.    Past Medical History:  Diagnosis Date  . Abnormal EKG   . Carpal tunnel syndrome   . CKD (chronic kidney disease), stage III ([place])   . DDD (degenerative disc disease), lumbar   . Hypertension   . Hypertension with renal disease   . Menopause   . Mixed hyperlipidemia   . Muscular degeneration   . Nonrheumatic aortic (valve) insufficiency   . Nonrheumatic mitral valve regurgitation   . Osteoarthritis   . Pedal edema   . Pleural effusion   . Skin cancer   . Vitamin D deficiency     Past Surgical History:  Procedure Laterality Date  . ANTERIOR CERVICAL DECOMP/DISCECTOMY FUSION  2009  . LUMBAR LAMINECTOMY/DECOMPRESSION MICRODISCECTOMY  06/13/2012   Procedure: LUMBAR LAMINECTOMY/DECOMPRESSION MICRODISCECTOMY 1 LEVEL;  Surgeon: Ophelia Charter, MD;  Location: [facility];  Service: Neurosurgery;  Laterality: Right;  RIGHT Lumbar two-three diskectomy    Family History  Problem Relation Age of Onset  . Breast cancer Neg Hx    Social History:  reports that she has never smoked. She has never used smokeless tobacco. She reports that she does not drink alcohol and does not use drugs.  Allergies:  Allergies  Allergen Reactions  . Sulfa Antibiotics Nausea Only    (Not in a hospital admission)   No results found for this or any previous visit (from the past 48 hour(s)). No results found.  Review of Systems  Musculoskeletal: Positive for arthralgias.  Skin: Positive for wound.  All other systems reviewed and are negative.   There were no vitals taken for this visit. Physical Exam Constitutional:      Appearance: Normal appearance.  HENT:     Head: Normocephalic and atraumatic.  Eyes:     Extraocular Movements: Extraocular movements intact.     Pupils: Pupils are equal, round, and  reactive to light.  Cardiovascular:     Rate and Rhythm: Normal rate.     Pulses: Normal pulses.  Pulmonary:     Effort: Pulmonary effort is normal. No respiratory distress.  Abdominal:     General: Abdomen is flat. There is no distension.  Musculoskeletal:     Right shoulder: Swelling, tenderness and bony tenderness present. Decreased range of motion. Decreased strength.     Cervical back: Normal range of motion and neck supple.  Skin:    General: Skin is warm and dry.     Findings: No erythema.  Neurological:     General: No focal deficit present.     Mental Status: She is alert and oriented to person, place, and time.  Psychiatric:        Mood and Affect: Mood normal.        Behavior: Behavior normal.      Assessment/Plan She is a candidate for arthroscopy, acromioplasty, and distal clavicle.  1.7 cm of retraction.   The patient is a candidate for surgery as above, and will need medical clearance.  No other doctors would need to be contacted from what I can tell.  I will see her back at this point pending surgery. The risks and benefits were discussed in detail with the patient.     Chriss Czar, PA-C 04/16/2021, 9:26 PM

## 2021-04-16 NOTE — H&P (Signed)
Jillian Cooley is an 87 y.o. female.   Chief Complaint: right shoulder pain HPI: She does have a full thickness tear with retraction.  She has tried PT and injection without relief.    Past Medical History:  Diagnosis Date  . Abnormal EKG   . Carpal tunnel syndrome   . CKD (chronic kidney disease), stage III ([place])   . DDD (degenerative disc disease), lumbar   . Hypertension   . Hypertension with renal disease   . Menopause   . Mixed hyperlipidemia   . Muscular degeneration   . Nonrheumatic aortic (valve) insufficiency   . Nonrheumatic mitral valve regurgitation   . Osteoarthritis   . Pedal edema   . Pleural effusion   . Skin cancer   . Vitamin D deficiency     Past Surgical History:  Procedure Laterality Date  . ANTERIOR CERVICAL DECOMP/DISCECTOMY FUSION  2009  . LUMBAR LAMINECTOMY/DECOMPRESSION MICRODISCECTOMY  06/13/2012   Procedure: LUMBAR LAMINECTOMY/DECOMPRESSION MICRODISCECTOMY 1 LEVEL;  Surgeon: Ophelia Charter, MD;  Location: [facility];  Service: Neurosurgery;  Laterality: Right;  RIGHT Lumbar two-three diskectomy    Family History  Problem Relation Age of Onset  . Breast cancer Neg Hx    Social History:  reports that she has never smoked. She has never used smokeless tobacco. She reports that she does not drink alcohol and does not use drugs.  Allergies:  Allergies  Allergen Reactions  . Sulfa Antibiotics Nausea Only    (Not in a hospital admission)   No results found for this or any previous visit (from the past 48 hour(s)). No results found.  Review of Systems  Musculoskeletal: Positive for arthralgias.  Skin: Positive for wound.  All other systems reviewed and are negative.   There were no vitals taken for this visit. Physical Exam Constitutional:      Appearance: Normal appearance.  HENT:     Head: Normocephalic and atraumatic.  Eyes:     Extraocular Movements: Extraocular movements intact.     Pupils: Pupils are equal, round, and  reactive to light.  Cardiovascular:     Rate and Rhythm: Normal rate.     Pulses: Normal pulses.  Pulmonary:     Effort: Pulmonary effort is normal. No respiratory distress.  Abdominal:     General: Abdomen is flat. There is no distension.  Musculoskeletal:     Right shoulder: Swelling, tenderness and bony tenderness present. Decreased range of motion. Decreased strength.     Cervical back: Normal range of motion and neck supple.  Skin:    General: Skin is warm and dry.     Findings: No erythema.  Neurological:     General: No focal deficit present.     Mental Status: She is alert and oriented to person, place, and time.  Psychiatric:        Mood and Affect: Mood normal.        Behavior: Behavior normal.      Assessment/Plan She is a candidate for arthroscopy, acromioplasty, and distal clavicle.  1.7 cm of retraction.   The patient is a candidate for surgery as above, and will need medical clearance.  No other doctors would need to be contacted from what I can tell.  I will see her back at this point pending surgery. The risks and benefits were discussed in detail with the patient.     Chriss Czar, PA-C 04/16/2021, 9:26 PM

## 2021-04-17 ENCOUNTER — Other Ambulatory Visit: Payer: Self-pay

## 2021-04-17 ENCOUNTER — Encounter (HOSPITAL_BASED_OUTPATIENT_CLINIC_OR_DEPARTMENT_OTHER): Payer: Self-pay | Admitting: Orthopedic Surgery

## 2021-04-17 NOTE — Progress Notes (Signed)
Patient had recent pleural effusion and thoracentesis done 02/07/21. Reviewed with Dr. Sabra Heck, ok to proceed without CXR.

## 2021-04-18 NOTE — Progress Notes (Signed)

## 2021-04-21 ENCOUNTER — Other Ambulatory Visit (HOSPITAL_COMMUNITY)
Admission: RE | Admit: 2021-04-21 | Discharge: 2021-04-21 | Disposition: A | Discharge status: Home / Self Care | Payer: Medicare PPO | Source: Ambulatory Visit | Attending: Orthopedic Surgery | Admitting: Orthopedic Surgery

## 2021-04-21 DIAGNOSIS — I509 Heart failure, unspecified: Secondary | ICD-10-CM | POA: Diagnosis not present

## 2021-04-21 DIAGNOSIS — J9601 Acute respiratory failure with hypoxia: Secondary | ICD-10-CM | POA: Diagnosis not present

## 2021-04-21 DIAGNOSIS — R059 Cough, unspecified: Secondary | ICD-10-CM | POA: Diagnosis not present

## 2021-04-21 DIAGNOSIS — Z85828 Personal history of other malignant neoplasm of skin: Secondary | ICD-10-CM | POA: Diagnosis not present

## 2021-04-21 DIAGNOSIS — I081 Rheumatic disorders of both mitral and tricuspid valves: Secondary | ICD-10-CM | POA: Diagnosis present

## 2021-04-21 DIAGNOSIS — M19011 Primary osteoarthritis, right shoulder: Secondary | ICD-10-CM | POA: Diagnosis not present

## 2021-04-21 DIAGNOSIS — I130 Hypertensive heart and chronic kidney disease with heart failure and stage 1 through stage 4 chronic kidney disease, or unspecified chronic kidney disease: Secondary | ICD-10-CM | POA: Diagnosis present

## 2021-04-21 DIAGNOSIS — M75101 Unspecified rotator cuff tear or rupture of right shoulder, not specified as traumatic: Secondary | ICD-10-CM | POA: Diagnosis present

## 2021-04-21 DIAGNOSIS — R0689 Other abnormalities of breathing: Secondary | ICD-10-CM | POA: Diagnosis not present

## 2021-04-21 DIAGNOSIS — S43431A Superior glenoid labrum lesion of right shoulder, initial encounter: Secondary | ICD-10-CM | POA: Diagnosis not present

## 2021-04-21 DIAGNOSIS — I351 Nonrheumatic aortic (valve) insufficiency: Secondary | ICD-10-CM | POA: Diagnosis not present

## 2021-04-21 DIAGNOSIS — Z20822 Contact with and (suspected) exposure to covid-19: Secondary | ICD-10-CM | POA: Diagnosis present

## 2021-04-21 DIAGNOSIS — R0602 Shortness of breath: Secondary | ICD-10-CM | POA: Diagnosis not present

## 2021-04-21 DIAGNOSIS — Z9889 Other specified postprocedural states: Secondary | ICD-10-CM | POA: Diagnosis not present

## 2021-04-21 DIAGNOSIS — M25511 Pain in right shoulder: Secondary | ICD-10-CM | POA: Diagnosis present

## 2021-04-21 DIAGNOSIS — I82461 Acute embolism and thrombosis of right calf muscular vein: Secondary | ICD-10-CM | POA: Diagnosis not present

## 2021-04-21 DIAGNOSIS — J9811 Atelectasis: Secondary | ICD-10-CM | POA: Diagnosis not present

## 2021-04-21 DIAGNOSIS — I340 Nonrheumatic mitral (valve) insufficiency: Secondary | ICD-10-CM | POA: Diagnosis not present

## 2021-04-21 DIAGNOSIS — M24111 Other articular cartilage disorders, right shoulder: Secondary | ICD-10-CM | POA: Diagnosis not present

## 2021-04-21 DIAGNOSIS — I272 Pulmonary hypertension, unspecified: Secondary | ICD-10-CM | POA: Diagnosis present

## 2021-04-21 DIAGNOSIS — N1832 Chronic kidney disease, stage 3b: Secondary | ICD-10-CM | POA: Diagnosis present

## 2021-04-21 DIAGNOSIS — Z882 Allergy status to sulfonamides status: Secondary | ICD-10-CM | POA: Diagnosis not present

## 2021-04-21 DIAGNOSIS — I5033 Acute on chronic diastolic (congestive) heart failure: Secondary | ICD-10-CM | POA: Diagnosis not present

## 2021-04-21 DIAGNOSIS — I82442 Acute embolism and thrombosis of left tibial vein: Secondary | ICD-10-CM | POA: Diagnosis not present

## 2021-04-21 DIAGNOSIS — Z01812 Encounter for preprocedural laboratory examination: Secondary | ICD-10-CM | POA: Insufficient documentation

## 2021-04-21 DIAGNOSIS — M7541 Impingement syndrome of right shoulder: Secondary | ICD-10-CM | POA: Diagnosis not present

## 2021-04-21 DIAGNOSIS — Z981 Arthrodesis status: Secondary | ICD-10-CM | POA: Diagnosis not present

## 2021-04-21 DIAGNOSIS — R0600 Dyspnea, unspecified: Secondary | ICD-10-CM | POA: Diagnosis not present

## 2021-04-21 DIAGNOSIS — J90 Pleural effusion, not elsewhere classified: Secondary | ICD-10-CM | POA: Diagnosis not present

## 2021-04-21 DIAGNOSIS — S46011A Strain of muscle(s) and tendon(s) of the rotator cuff of right shoulder, initial encounter: Secondary | ICD-10-CM | POA: Diagnosis not present

## 2021-04-21 DIAGNOSIS — M5136 Other intervertebral disc degeneration, lumbar region: Secondary | ICD-10-CM | POA: Diagnosis present

## 2021-04-21 DIAGNOSIS — G8918 Other acute postprocedural pain: Secondary | ICD-10-CM | POA: Diagnosis not present

## 2021-04-21 NOTE — Anesthesia Preprocedure Evaluation (Addendum)
Anesthesia Evaluation  Patient identified by MRN, date of birth, ID band Patient awake    Reviewed: Allergy & Precautions, NPO status , Patient's Chart, lab work & pertinent test results  Airway Mallampati: III  TM Distance: >3 FB Neck ROM: Limited   Comment: Limited neck extension 2/2 ACDF Dental  (+) Teeth Intact, Dental Advisory Given   Pulmonary  Symptomatic pleural effusion thought to be cardiogenic in etiology in Feb 2022 (53mo ago)- 1L pleural fluid drained from left side at that time and symptoms have greatly improved since then.    Pulmonary exam normal breath sounds clear to auscultation       Cardiovascular hypertension, Pt. on medications Normal cardiovascular exam+ Valvular Problems/Murmurs (mod MR, mild-mod AI) MR and AI  Rhythm:Regular Rate:Normal  Echo 01/23/21 scanned in: LVEF 70%, mod MR, mild-mod AI   Neuro/Psych negative psych ROS   GI/Hepatic negative GI ROS, Neg liver ROS,   Endo/Other  negative endocrine ROS  Renal/GU Renal disease (CKD 3, Cr 1.7)  negative genitourinary   Musculoskeletal  (+) Arthritis , Osteoarthritis,  Right shoulder rotator cuff tear    Abdominal   Peds  Hematology negative hematology ROS (+)   Anesthesia Other Findings   Reproductive/Obstetrics negative OB ROS                           Anesthesia Physical Anesthesia Plan  ASA: III  Anesthesia Plan: General and Regional   Post-op Pain Management: GA combined w/ Regional for post-op pain   Induction: Intravenous  PONV Risk Score and Plan: 3 and Ondansetron, Dexamethasone and Treatment may vary due to age or medical condition  Airway Management Planned: Oral ETT  Additional Equipment: None  Intra-op Plan:   Post-operative Plan: Extubation in OR  Informed Consent: I have reviewed the patients History and Physical, chart, labs and discussed the procedure including the risks, benefits and  alternatives for the proposed anesthesia with the patient or authorized representative who has indicated his/her understanding and acceptance.     Dental advisory given  Plan Discussed with: CRNA  Anesthesia Plan Comments: (Intubated 2013 (s/p ACDF) with direct laryngoscopy w/o isse)       Anesthesia Quick Evaluation

## 2021-04-22 LAB — SARS CORONAVIRUS 2 (TAT 6-24 HRS): SARS Coronavirus 2: NEGATIVE

## 2021-04-23 ENCOUNTER — Encounter (HOSPITAL_COMMUNITY)
Admission: AD | Disposition: A | Discharge status: Home / Self Care | Payer: Self-pay | Source: Home / Self Care | Attending: Orthopedic Surgery

## 2021-04-23 ENCOUNTER — Ambulatory Visit (HOSPITAL_BASED_OUTPATIENT_CLINIC_OR_DEPARTMENT_OTHER): Payer: Medicare PPO | Admitting: Anesthesiology

## 2021-04-23 ENCOUNTER — Inpatient Hospital Stay (HOSPITAL_BASED_OUTPATIENT_CLINIC_OR_DEPARTMENT_OTHER)
Admission: AD | Admit: 2021-04-23 | Discharge: 2021-04-26 | DRG: 500 | Disposition: A | Discharge status: Home / Self Care | Payer: Medicare PPO | Attending: Orthopedic Surgery | Admitting: Orthopedic Surgery

## 2021-04-23 ENCOUNTER — Encounter (HOSPITAL_BASED_OUTPATIENT_CLINIC_OR_DEPARTMENT_OTHER): Payer: Self-pay | Admitting: Orthopedic Surgery

## 2021-04-23 ENCOUNTER — Ambulatory Visit (HOSPITAL_COMMUNITY): Payer: Medicare PPO

## 2021-04-23 ENCOUNTER — Other Ambulatory Visit: Payer: Self-pay

## 2021-04-23 DIAGNOSIS — J90 Pleural effusion, not elsewhere classified: Secondary | ICD-10-CM | POA: Diagnosis not present

## 2021-04-23 DIAGNOSIS — I5033 Acute on chronic diastolic (congestive) heart failure: Secondary | ICD-10-CM | POA: Diagnosis not present

## 2021-04-23 DIAGNOSIS — I081 Rheumatic disorders of both mitral and tricuspid valves: Secondary | ICD-10-CM | POA: Diagnosis present

## 2021-04-23 DIAGNOSIS — R059 Cough, unspecified: Secondary | ICD-10-CM | POA: Diagnosis not present

## 2021-04-23 DIAGNOSIS — S46011A Strain of muscle(s) and tendon(s) of the rotator cuff of right shoulder, initial encounter: Secondary | ICD-10-CM | POA: Diagnosis not present

## 2021-04-23 DIAGNOSIS — I82442 Acute embolism and thrombosis of left tibial vein: Secondary | ICD-10-CM | POA: Diagnosis not present

## 2021-04-23 DIAGNOSIS — M75101 Unspecified rotator cuff tear or rupture of right shoulder, not specified as traumatic: Principal | ICD-10-CM | POA: Diagnosis present

## 2021-04-23 DIAGNOSIS — I82461 Acute embolism and thrombosis of right calf muscular vein: Secondary | ICD-10-CM | POA: Diagnosis not present

## 2021-04-23 DIAGNOSIS — Z981 Arthrodesis status: Secondary | ICD-10-CM

## 2021-04-23 DIAGNOSIS — Z20822 Contact with and (suspected) exposure to covid-19: Secondary | ICD-10-CM | POA: Diagnosis present

## 2021-04-23 DIAGNOSIS — N1832 Chronic kidney disease, stage 3b: Secondary | ICD-10-CM | POA: Diagnosis present

## 2021-04-23 DIAGNOSIS — J9811 Atelectasis: Secondary | ICD-10-CM | POA: Diagnosis not present

## 2021-04-23 DIAGNOSIS — I509 Heart failure, unspecified: Secondary | ICD-10-CM

## 2021-04-23 DIAGNOSIS — Z882 Allergy status to sulfonamides status: Secondary | ICD-10-CM

## 2021-04-23 DIAGNOSIS — I272 Pulmonary hypertension, unspecified: Secondary | ICD-10-CM | POA: Diagnosis present

## 2021-04-23 DIAGNOSIS — M7541 Impingement syndrome of right shoulder: Secondary | ICD-10-CM | POA: Diagnosis not present

## 2021-04-23 DIAGNOSIS — Z9889 Other specified postprocedural states: Secondary | ICD-10-CM

## 2021-04-23 DIAGNOSIS — Z85828 Personal history of other malignant neoplasm of skin: Secondary | ICD-10-CM

## 2021-04-23 DIAGNOSIS — G8918 Other acute postprocedural pain: Secondary | ICD-10-CM | POA: Diagnosis not present

## 2021-04-23 DIAGNOSIS — M24111 Other articular cartilage disorders, right shoulder: Secondary | ICD-10-CM | POA: Diagnosis not present

## 2021-04-23 DIAGNOSIS — S43431A Superior glenoid labrum lesion of right shoulder, initial encounter: Secondary | ICD-10-CM | POA: Diagnosis not present

## 2021-04-23 DIAGNOSIS — I130 Hypertensive heart and chronic kidney disease with heart failure and stage 1 through stage 4 chronic kidney disease, or unspecified chronic kidney disease: Secondary | ICD-10-CM | POA: Diagnosis present

## 2021-04-23 DIAGNOSIS — M5136 Other intervertebral disc degeneration, lumbar region: Secondary | ICD-10-CM | POA: Diagnosis present

## 2021-04-23 DIAGNOSIS — M19011 Primary osteoarthritis, right shoulder: Secondary | ICD-10-CM | POA: Diagnosis not present

## 2021-04-23 DIAGNOSIS — J9601 Acute respiratory failure with hypoxia: Secondary | ICD-10-CM | POA: Diagnosis not present

## 2021-04-23 HISTORY — PX: SHOULDER ARTHROSCOPY WITH ROTATOR CUFF REPAIR AND SUBACROMIAL DECOMPRESSION: SHX5686

## 2021-04-23 LAB — BASIC METABOLIC PANEL
Anion gap: 6 (ref 5–15)
BUN: 48 mg/dL — ABNORMAL HIGH (ref 8–23)
CO2: 20 mmol/L — ABNORMAL LOW (ref 22–32)
Calcium: 8.8 mg/dL — ABNORMAL LOW (ref 8.9–10.3)
Chloride: 110 mmol/L (ref 98–111)
Creatinine, Ser: 1.84 mg/dL — ABNORMAL HIGH (ref 0.44–1.00)
GFR, Estimated: 26 mL/min — ABNORMAL LOW (ref >60)
Glucose, Bld: 205 mg/dL — ABNORMAL HIGH (ref 70–99)
Potassium: 4 mmol/L (ref 3.5–5.1)
Sodium: 136 mmol/L (ref 135–145)

## 2021-04-23 LAB — TROPONIN I (HIGH SENSITIVITY)
Troponin I (High Sensitivity): 28 ng/L — ABNORMAL HIGH (ref <18)
Troponin I (High Sensitivity): 29 ng/L — ABNORMAL HIGH (ref <18)

## 2021-04-23 SURGERY — SHOULDER ARTHROSCOPY WITH ROTATOR CUFF REPAIR AND SUBACROMIAL DECOMPRESSION
Anesthesia: Regional | Site: Shoulder | Laterality: Right

## 2021-04-23 MED ORDER — DEXAMETHASONE SODIUM PHOSPHATE 10 MG/ML IJ SOLN
INTRAMUSCULAR | Status: AC
  Filled 2021-04-23: qty 1

## 2021-04-23 MED ORDER — FUROSEMIDE 10 MG/ML IJ SOLN
40.0000 mg | Freq: Every day | INTRAMUSCULAR | Status: DC
  Administered 2021-04-23 – 2021-04-24 (×2): 40 mg via INTRAVENOUS
  Filled 2021-04-23 (×2): qty 4

## 2021-04-23 MED ORDER — FENTANYL CITRATE (PF) 100 MCG/2ML IJ SOLN
INTRAMUSCULAR | Status: AC
  Filled 2021-04-23: qty 2

## 2021-04-23 MED ORDER — FUROSEMIDE 40 MG PO TABS: 60.0000 mg | ORAL_TABLET | Freq: Every day | ORAL | Status: DC

## 2021-04-23 MED ORDER — SODIUM CHLORIDE 0.9 % IV SOLN: INTRAVENOUS | Status: DC

## 2021-04-23 MED ORDER — OXYCODONE HCL 5 MG PO TABS: 5.0000 mg | ORAL_TABLET | Freq: Once | ORAL | Status: DC | PRN

## 2021-04-23 MED ORDER — ONDANSETRON HCL 4 MG/2ML IJ SOLN: 4.0000 mg | Freq: Four times a day (QID) | INTRAMUSCULAR | Status: DC | PRN

## 2021-04-23 MED ORDER — ONDANSETRON HCL 4 MG/2ML IJ SOLN: 4.0000 mg | Freq: Once | INTRAMUSCULAR | Status: DC | PRN

## 2021-04-23 MED ORDER — ACETAMINOPHEN 500 MG PO TABS
1000.0000 mg | ORAL_TABLET | Freq: Once | ORAL | Status: AC
  Administered 2021-04-23: 1000 mg via ORAL

## 2021-04-23 MED ORDER — MORPHINE SULFATE (PF) 2 MG/ML IV SOLN: 0.5000 mg | INTRAVENOUS | Status: DC | PRN

## 2021-04-23 MED ORDER — ONDANSETRON HCL 4 MG PO TABS: 4.0000 mg | ORAL_TABLET | Freq: Four times a day (QID) | ORAL | Status: DC | PRN

## 2021-04-23 MED ORDER — ACETAMINOPHEN 500 MG PO TABS
ORAL_TABLET | ORAL | Status: AC
  Filled 2021-04-23: qty 2

## 2021-04-23 MED ORDER — SIMVASTATIN 20 MG PO TABS
20.0000 mg | ORAL_TABLET | Freq: Every day | ORAL | Status: DC
  Administered 2021-04-23 – 2021-04-26 (×4): 20 mg via ORAL
  Filled 2021-04-23 (×4): qty 1

## 2021-04-23 MED ORDER — CEFAZOLIN SODIUM-DEXTROSE 2-4 GM/100ML-% IV SOLN
INTRAVENOUS | Status: AC
  Filled 2021-04-23: qty 100

## 2021-04-23 MED ORDER — HYDROCODONE-ACETAMINOPHEN 5-325 MG PO TABS: 1.0000 | ORAL_TABLET | ORAL | 0 refills | Status: DC | PRN

## 2021-04-23 MED ORDER — HYDROCODONE-ACETAMINOPHEN 5-325 MG PO TABS: 1.0000 | ORAL_TABLET | ORAL | Status: DC | PRN

## 2021-04-23 MED ORDER — FENTANYL CITRATE (PF) 100 MCG/2ML IJ SOLN: 25.0000 ug | INTRAMUSCULAR | Status: DC | PRN

## 2021-04-23 MED ORDER — LACTATED RINGERS IV SOLN: INTRAVENOUS | Status: DC

## 2021-04-23 MED ORDER — PHENYLEPHRINE HCL (PRESSORS) 10 MG/ML IV SOLN
INTRAVENOUS | Status: DC | PRN
  Administered 2021-04-23: 40 ug via INTRAVENOUS
  Administered 2021-04-23: 80 ug via INTRAVENOUS
  Administered 2021-04-23: 40 ug via INTRAVENOUS
  Administered 2021-04-23: 80 ug via INTRAVENOUS

## 2021-04-23 MED ORDER — BUPIVACAINE LIPOSOME 1.3 % IJ SUSP
INTRAMUSCULAR | Status: DC | PRN
  Administered 2021-04-23: 10 mL via PERINEURAL

## 2021-04-23 MED ORDER — LIDOCAINE 2% (20 MG/ML) 5 ML SYRINGE
INTRAMUSCULAR | Status: AC
  Filled 2021-04-23: qty 5

## 2021-04-23 MED ORDER — BUPIVACAINE HCL (PF) 0.5 % IJ SOLN
INTRAMUSCULAR | Status: DC | PRN
  Administered 2021-04-23: 10 mL via PERINEURAL

## 2021-04-23 MED ORDER — ROCURONIUM BROMIDE 10 MG/ML (PF) SYRINGE
PREFILLED_SYRINGE | INTRAVENOUS | Status: AC
  Filled 2021-04-23: qty 10

## 2021-04-23 MED ORDER — SUGAMMADEX SODIUM 500 MG/5ML IV SOLN
INTRAVENOUS | Status: AC
  Filled 2021-04-23: qty 5

## 2021-04-23 MED ORDER — CEFAZOLIN SODIUM-DEXTROSE 2-4 GM/100ML-% IV SOLN
2.0000 g | INTRAVENOUS | Status: AC
  Administered 2021-04-23: 2 g via INTRAVENOUS

## 2021-04-23 MED ORDER — LIDOCAINE 2% (20 MG/ML) 5 ML SYRINGE
INTRAMUSCULAR | Status: DC | PRN
  Administered 2021-04-23: 20 mg via INTRAVENOUS

## 2021-04-23 MED ORDER — PROPOFOL 10 MG/ML IV BOLUS
INTRAVENOUS | Status: DC | PRN
  Administered 2021-04-23: 30 mg via INTRAVENOUS
  Administered 2021-04-23: 100 mg via INTRAVENOUS

## 2021-04-23 MED ORDER — FENTANYL CITRATE (PF) 100 MCG/2ML IJ SOLN
50.0000 ug | Freq: Once | INTRAMUSCULAR | Status: AC
Start: 2021-04-23 — End: 2021-04-23
  Administered 2021-04-23: 50 ug via INTRAVENOUS

## 2021-04-23 MED ORDER — ROCURONIUM BROMIDE 10 MG/ML (PF) SYRINGE
PREFILLED_SYRINGE | INTRAVENOUS | Status: DC | PRN
  Administered 2021-04-23: 50 mg via INTRAVENOUS

## 2021-04-23 MED ORDER — SUGAMMADEX SODIUM 200 MG/2ML IV SOLN
INTRAVENOUS | Status: DC | PRN
  Administered 2021-04-23: 200 mg via INTRAVENOUS

## 2021-04-23 MED ORDER — METOCLOPRAMIDE HCL 5 MG/ML IJ SOLN: 5.0000 mg | Freq: Three times a day (TID) | INTRAMUSCULAR | Status: DC | PRN

## 2021-04-23 MED ORDER — AMLODIPINE BESYLATE 5 MG PO TABS
5.0000 mg | ORAL_TABLET | Freq: Every day | ORAL | Status: DC
  Administered 2021-04-24 – 2021-04-26 (×3): 5 mg via ORAL
  Filled 2021-04-23 (×3): qty 1

## 2021-04-23 MED ORDER — PHENYLEPHRINE HCL-NACL 10-0.9 MG/250ML-% IV SOLN
INTRAVENOUS | Status: DC | PRN
  Administered 2021-04-23: 40 ug/min via INTRAVENOUS

## 2021-04-23 MED ORDER — DOCUSATE SODIUM 100 MG PO CAPS
100.0000 mg | ORAL_CAPSULE | Freq: Two times a day (BID) | ORAL | Status: DC
  Administered 2021-04-23 – 2021-04-26 (×6): 100 mg via ORAL
  Filled 2021-04-23 (×6): qty 1

## 2021-04-23 MED ORDER — FENTANYL CITRATE (PF) 250 MCG/5ML IJ SOLN
INTRAMUSCULAR | Status: DC | PRN
  Administered 2021-04-23: 25 ug via INTRAVENOUS

## 2021-04-23 MED ORDER — LOSARTAN POTASSIUM 50 MG PO TABS
25.0000 mg | ORAL_TABLET | Freq: Every day | ORAL | Status: DC
  Administered 2021-04-23 – 2021-04-26 (×4): 25 mg via ORAL
  Filled 2021-04-23 (×4): qty 1

## 2021-04-23 MED ORDER — MIDAZOLAM HCL 2 MG/2ML IJ SOLN
INTRAMUSCULAR | Status: AC
  Filled 2021-04-23: qty 2

## 2021-04-23 MED ORDER — ONDANSETRON HCL 4 MG/2ML IJ SOLN
INTRAMUSCULAR | Status: DC | PRN
  Administered 2021-04-23: 4 mg via INTRAVENOUS

## 2021-04-23 MED ORDER — PROPOFOL 10 MG/ML IV BOLUS
INTRAVENOUS | Status: AC
  Filled 2021-04-23: qty 20

## 2021-04-23 MED ORDER — OXYCODONE HCL 5 MG/5ML PO SOLN: 5.0000 mg | Freq: Once | ORAL | Status: DC | PRN

## 2021-04-23 MED ORDER — FUROSEMIDE 10 MG/ML IJ SOLN: 40.0000 mg | Freq: Every day | INTRAMUSCULAR | Status: DC

## 2021-04-23 MED ORDER — PHENYLEPHRINE HCL (PRESSORS) 10 MG/ML IV SOLN
INTRAVENOUS | Status: AC
  Filled 2021-04-23: qty 1

## 2021-04-23 MED ORDER — METOCLOPRAMIDE HCL 5 MG PO TABS: 5.0000 mg | ORAL_TABLET | Freq: Three times a day (TID) | ORAL | Status: DC | PRN

## 2021-04-23 MED ORDER — ACETAMINOPHEN 325 MG PO TABS
325.0000 mg | ORAL_TABLET | Freq: Four times a day (QID) | ORAL | Status: DC | PRN
  Administered 2021-04-24 – 2021-04-25 (×2): 650 mg via ORAL
  Filled 2021-04-23 (×3): qty 2

## 2021-04-23 MED ORDER — DEXAMETHASONE SODIUM PHOSPHATE 10 MG/ML IJ SOLN
INTRAMUSCULAR | Status: DC | PRN
  Administered 2021-04-23: 5 mg via INTRAVENOUS

## 2021-04-23 SURGICAL SUPPLY — 77 items
AID PSTN UNV HD RSTRNT DISP (MISCELLANEOUS) ×1
APL SKNCLS STERI-STRIP NONHPOA (GAUZE/BANDAGES/DRESSINGS) ×1
BENZOIN TINCTURE PRP APPL 2/3 (GAUZE/BANDAGES/DRESSINGS) ×1 IMPLANT
BLADE AVERAGE 25X9 (BLADE) ×1 IMPLANT
BLADE SURG 15 STRL LF DISP TIS (BLADE) IMPLANT
BLADE SURG 15 STRL SS (BLADE) ×2
BUR EGG 3PK/BX (BURR) ×1 IMPLANT
BURR OVAL 8 FLU 4.0X13 (MISCELLANEOUS) IMPLANT
CANNULA 5.75X71 LONG (CANNULA) IMPLANT
CANNULA SHOULDER 7CM (CANNULA) ×2 IMPLANT
CANNULA TWIST IN 8.25X7CM (CANNULA) IMPLANT
CLEANER CAUTERY TIP 5X5 PAD (MISCELLANEOUS) IMPLANT
COVER WAND RF STERILE (DRAPES) IMPLANT
DECANTER SPIKE VIAL GLASS SM (MISCELLANEOUS) IMPLANT
DISSECTOR  3.8MM X 13CM (MISCELLANEOUS) ×2
DISSECTOR 3.8MM X 13CM (MISCELLANEOUS) IMPLANT
DISSECTOR 4.0MM X 13CM (MISCELLANEOUS) IMPLANT
DRAPE STERI 35X30 U-POUCH (DRAPES) ×2 IMPLANT
DRAPE SURG 17X23 STRL (DRAPES) ×2 IMPLANT
DRAPE U-SHAPE 76X120 STRL (DRAPES) ×4 IMPLANT
DRESSING MEPILEX FLEX 4X4 (GAUZE/BANDAGES/DRESSINGS) IMPLANT
DRSG EMULSION OIL 3X3 NADH (GAUZE/BANDAGES/DRESSINGS) ×2 IMPLANT
DRSG MEPILEX BORDER 4X8 (GAUZE/BANDAGES/DRESSINGS) ×1 IMPLANT
DRSG MEPILEX FLEX 4X4 (GAUZE/BANDAGES/DRESSINGS)
DRSG PAD ABDOMINAL 8X10 ST (GAUZE/BANDAGES/DRESSINGS) ×2 IMPLANT
DURAPREP 26ML APPLICATOR (WOUND CARE) ×2 IMPLANT
ELECT REM PT RETURN 9FT ADLT (ELECTROSURGICAL) ×2
ELECTRODE REM PT RTRN 9FT ADLT (ELECTROSURGICAL) ×1 IMPLANT
GAUZE SPONGE 4X4 12PLY STRL (GAUZE/BANDAGES/DRESSINGS) ×2 IMPLANT
GLOVE SRG 8 PF TXTR STRL LF DI (GLOVE) ×2 IMPLANT
GLOVE SURG ENC MOIS LTX SZ7.5 (GLOVE) ×4 IMPLANT
GLOVE SURG ORTHO LTX SZ8 (GLOVE) ×4 IMPLANT
GLOVE SURG UNDER POLY LF SZ8 (GLOVE) ×4
GOWN STRL REUS W/ TWL LRG LVL3 (GOWN DISPOSABLE) ×1 IMPLANT
GOWN STRL REUS W/ TWL XL LVL3 (GOWN DISPOSABLE) ×1 IMPLANT
GOWN STRL REUS W/TWL LRG LVL3 (GOWN DISPOSABLE)
GOWN STRL REUS W/TWL XL LVL3 (GOWN DISPOSABLE) ×8 IMPLANT
MANIFOLD NEPTUNE II (INSTRUMENTS) ×2 IMPLANT
NDL 1/2 CIR CATGUT .05X1.09 (NEEDLE) IMPLANT
NDL SCORPION MULTI FIRE (NEEDLE) IMPLANT
NEEDLE 1/2 CIR CATGUT .05X1.09 (NEEDLE) IMPLANT
NEEDLE SCORPION MULTI FIRE (NEEDLE) IMPLANT
NS IRRIG 1000ML POUR BTL (IV SOLUTION) ×2 IMPLANT
PACK ARTHROSCOPY DSU (CUSTOM PROCEDURE TRAY) ×2 IMPLANT
PACK BASIN DAY SURGERY FS (CUSTOM PROCEDURE TRAY) ×2 IMPLANT
PAD CLEANER CAUTERY TIP 5X5 (MISCELLANEOUS)
PAD ORTHO SHOULDER 7X19 LRG (SOFTGOODS) IMPLANT
PENCIL SMOKE EVACUATOR (MISCELLANEOUS) ×1 IMPLANT
PORT APPOLLO RF 90DEGREE MULTI (SURGICAL WAND) IMPLANT
RESTRAINT HEAD UNIVERSAL NS (MISCELLANEOUS) ×2 IMPLANT
SLEEVE SCD COMPRESS KNEE MED (STOCKING) ×2 IMPLANT
SLING ARM FOAM STRAP LRG (SOFTGOODS) IMPLANT
SLING ULTRA II MEDIUM (SOFTGOODS) IMPLANT
SLING ULTRA II SMALL (SOFTGOODS) IMPLANT
SLING ULTRA III MED (ORTHOPEDIC SUPPLIES) ×1 IMPLANT
SPONGE LAP 4X18 RFD (DISPOSABLE) ×1 IMPLANT
STRIP CLOSURE SKIN 1/2X4 (GAUZE/BANDAGES/DRESSINGS) ×1 IMPLANT
SUCTION FRAZIER HANDLE 10FR (MISCELLANEOUS)
SUCTION TUBE FRAZIER 10FR DISP (MISCELLANEOUS) IMPLANT
SUT BONE WAX W31G (SUTURE) ×1 IMPLANT
SUT ETHILON 3 0 PS 1 (SUTURE) ×2 IMPLANT
SUT FIBERWIRE #2 38 T-5 BLUE (SUTURE)
SUT MNCRL AB 3-0 PS2 18 (SUTURE) ×1 IMPLANT
SUT TICRON 1 T 12 (SUTURE) ×1 IMPLANT
SUT TIGER TAPE 7 IN WHITE (SUTURE) ×1 IMPLANT
SUT VIC AB 0 CT1 27 (SUTURE)
SUT VIC AB 0 CT1 27XBRD ANBCTR (SUTURE) IMPLANT
SUT VIC AB 1 CT1 27 (SUTURE)
SUT VIC AB 1 CT1 27XBRD ANBCTR (SUTURE) IMPLANT
SUT VIC AB 2-0 SH 27 (SUTURE) ×2
SUT VIC AB 2-0 SH 27XBRD (SUTURE) IMPLANT
SUTURE FIBERWR #2 38 T-5 BLUE (SUTURE) IMPLANT
TAPE FIBER 2MM 7IN #2 BLUE (SUTURE) IMPLANT
TOWEL GREEN STERILE FF (TOWEL DISPOSABLE) ×2 IMPLANT
TUBING ARTHROSCOPY IRRIG 16FT (MISCELLANEOUS) ×2 IMPLANT
WATER STERILE IRR 1000ML POUR (IV SOLUTION) ×2 IMPLANT
YANKAUER SUCT BULB TIP NO VENT (SUCTIONS) ×1 IMPLANT

## 2021-04-23 NOTE — Consult Note (Signed)
[facility] Medical Consultation  Jovan Schickling WEX:[id number] DOB: 21-Jun-1932 DOA: 04/23/2021 PCP: [name], MD   Requesting physician: Dr. French Ana Date of consultation: 04/23/2021 Reason for consultation: Hypoxia  Impression/Recommendations Active Problems:   S/P right rotator cuff repair   S/P arthroscopy of shoulder   Acute hypoxic respite failure -secondary to fluid overload and worsening of left sided pleural effusion.  Further suspect there is acute element of IV fluid output about 1.2 liters given today during OR (1 liter of LR and 200 ml of other meds), and patient was told not to take today's Lasix this morning. The chronic element seems to be overall under-diuresed. I recommend when she goes home, to get scale to check her weight every day, and take Ensure 1 to 2 pills of Lasix when seeing weight gaining >2 lbs in one day or 5 lbs in 3 days, or she can check her leg swelling as another marker for increase diuresis.  Patient and her family expressed understanding and agreed. -For now we will increase IV Lasix 40 mg daily today and tomorrow, check checks x-ray tomorrow. -If her oxygen level not weaning down tomorrow, consider CT scan and consult IR for therapeutic thoracentesis.  Stage III CKD -No signs of fluid overload, increase Lasix for today and tomorrow  Chronic aortic regurgitation, mitral regurgitation and tricuspid regurgitation -As above.  HTN -Continue Norvasc and Losartan.  Right shoulder surgery -As per ortho team  I will followup again tomorrow. Please contact me if I can be of assistance in the meanwhile. Thank you for this consultation.  Chief Complaint: SOB  HPI:  87 year old female patient with past medical history of moderate aortic/mitral/tricuspid regurgitation, HTN, CKD stage III, right shoulder pain presented today for elective right shoulder surgery, then developed acute hypoxia after surgery.  Patient has had chronic  right shoulder pain, was scheduled to have right shoulder surgery in March, however during preop evaluation was found to have a large left-sided pleural effusion.  Thoracentesis was performed on February 18 at pulmonologist office and drained 1 L of transudate.  Echocardiogram showed aortic/mitral/tricuspid regurgitation.  Patient was started on 60 mg Lasix daily for 3 weeks and then decrease to 20 mg daily since, for the reason of worsening of her kidney functions.  For the last few weeks, patient has noticed increased leg swelling, but denied any breathing problems no chest pains.  Today, patient went to have elective right shoulder surgery.  After surgery, however was noticed that ,patient developed hypoxia, x-ray showed pulmonary congestion and worsening of left-sided pleural effusion.  Review of Systems:  14 point review system done negative except those mentioned in HPI  Past Medical History:  Diagnosis Date  . Abnormal EKG   . Carpal tunnel syndrome   . CKD (chronic kidney disease), stage III ([place])   . DDD (degenerative disc disease), lumbar   . Hypertension   . Hypertension with renal disease   . Menopause   . Mixed hyperlipidemia   . Muscular degeneration   . Nonrheumatic aortic (valve) insufficiency   . Nonrheumatic mitral valve regurgitation   . Osteoarthritis   . Pedal edema   . Pleural effusion   . Skin cancer   . Vitamin D deficiency    Past Surgical History:  Procedure Laterality Date  . ANTERIOR CERVICAL DECOMP/DISCECTOMY FUSION  2009  . CARPAL TUNNEL RELEASE Bilateral   . LUMBAR LAMINECTOMY/DECOMPRESSION MICRODISCECTOMY  06/13/2012   Procedure: LUMBAR LAMINECTOMY/DECOMPRESSION MICRODISCECTOMY 1 LEVEL;  Surgeon: Ophelia Charter, MD;  Location: [facility];  Service: Neurosurgery;  Laterality: Right;  RIGHT Lumbar two-three diskectomy   Social History:  reports that she has never smoked. She has never used smokeless tobacco. She reports that she does not drink  alcohol and does not use drugs.  Allergies  Allergen Reactions  . Sulfa Antibiotics Nausea Only  . Sulfamethoxazole-Trimethoprim     Other reaction(s): nausea   Family History  Problem Relation Age of Onset  . Breast cancer Neg Hx     Prior to Admission medications   Medication Sig Start Date End Date Taking? Authorizing Provider  HYDROcodone-acetaminophen (NORCO/VICODIN) 5-325 MG tablet Take 1 tablet by mouth every 4 (four) hours as needed for moderate pain (may need 1-2 first few days followin surgery max 8 tabs in 24hrs). 04/23/21 04/23/22 Yes Chadwell, Vonna Kotyk, PA-C  amLODipine (NORVASC) 5 MG tablet Take 1 tablet by mouth daily. 11/08/19   [provider]  Calcium Carb-Cholecalciferol 600-200 MG-UNIT TABS 1 tablet with food    [provider]  furosemide (LASIX) 40 MG tablet Take 1.5 tablets (60 mg total) by mouth daily. 02/21/21   Chandrasekhar, Terisa Starr, MD  losartan (COZAAR) 25 MG tablet Take 25 mg by mouth daily. 12/16/19   [provider]  Multiple Vitamins-Minerals (PRESERVISION AREDS 2+MULTI VIT PO) Take 1 tablet by mouth 2 (two) times daily.    [provider]  simvastatin (ZOCOR) 20 MG tablet Take 20 mg by mouth daily. 11/03/20   [provider]   Physical Exam: Blood pressure (!) 143/68, pulse 74, temperature (!) 97.4 F (36.3 C), temperature source Axillary, resp. rate 19, height 5\' 3"  (1.6 m), weight 63.2 kg, SpO2 93 %. Vitals:   04/23/21 1525 04/23/21 1545  BP: (!) 143/68   Pulse: 74   Resp: 19   Temp: (!) 97.4 F (36.3 C)   SpO2: 97% 93%     General: Mild respiratory distress  Eyes: PERRL  ENT: Mucous membranes moist  Neck: Supple no JVD  Cardiovascular: RRR, systolic murmur on apex and heart base, 2+ pitting edema  Respiratory: RRR, decreased breathing sound on the left lower field, bilateral crackles  Abdomen: Soft nontender  Skin: No rash  Musculoskeletal: Right shoulder is in surgical  dressing  Psychiatric: calm  Neurologic: No focal deficit  Labs on Admission:  Basic Metabolic Panel: No results for input(s): NA, K, CL, CO2, GLUCOSE, BUN, CREATININE, CALCIUM, MG, PHOS in the last 168 hours. Liver Function Tests: No results for input(s): AST, ALT, ALKPHOS, BILITOT, PROT, ALBUMIN in the last 168 hours. No results for input(s): LIPASE, AMYLASE in the last 168 hours. No results for input(s): AMMONIA in the last 168 hours. CBC: No results for input(s): WBC, NEUTROABS, HGB, HCT, MCV, PLT in the last 168 hours. Cardiac Enzymes: No results for input(s): CKTOTAL, CKMB, CKMBINDEX, TROPONINI in the last 168 hours. BNP: Invalid input(s): POCBNP CBG: No results for input(s): GLUCAP in the last 168 hours.  Radiological Exams on Admission: No results found.  EKG: ordered  Time spent: [facility] Pager [phone] 04/23/2021, 5:29 PM

## 2021-04-23 NOTE — Progress Notes (Signed)
Assisted Dr. Finucane with right, ultrasound guided, interscalene  block. Side rails up, monitors on throughout procedure. See vital signs in flow sheet. Tolerated Procedure well. ?

## 2021-04-23 NOTE — Interval H&P Note (Signed)
History and Physical Interval Note:  04/23/2021 9:59 AM  Jillian Cooley  has presented today for surgery, with the diagnosis of RIGHT SHOULDER ROTATOR CUFF TEAR.  The various methods of treatment have been discussed with the patient and family. After consideration of risks, benefits and other options for treatment, the patient has consented to  Procedure(s): SHOULDER ARTHROSCOPY WITH ROTATOR CUFF REPAIR AND SUBACROMIAL DECOMPRESSION (Right) as a surgical intervention.  [name] history has been reviewed, patient examined, no change in status, stable for surgery.  I have reviewed the patient's chart and labs.  Questions were answered to the patient's satisfaction.     Yvette Rack

## 2021-04-23 NOTE — Interval H&P Note (Signed)
History and Physical Interval Note:  04/23/2021 8:35 AM  Jillian Cooley  has presented today for surgery, with the diagnosis of [place].  The various methods of treatment have been discussed with the patient and family. After consideration of risks, benefits and other options for treatment, the patient has consented to  Procedure(s): SHOULDER ARTHROSCOPY WITH ROTATOR CUFF REPAIR AND SUBACROMIAL DECOMPRESSION (Right) as a surgical intervention.  [name] history has been reviewed, patient examined, no change in status, stable for surgery.  I have reviewed the patient's chart and labs.  Questions were answered to the patient's satisfaction.     Yvette Rack

## 2021-04-23 NOTE — Progress Notes (Signed)
Pt arrived to 5N28 via elink. Pt sitting in chair, in no distress. Telemetry and pulse ox monitor on. Demonstrated correct use of IS. Call bell within reach. Daughter at bedside.

## 2021-04-23 NOTE — Brief Op Note (Signed)
04/23/2021  12:33 PM  PATIENT:  Jillian Cooley  87 y.o. female  PRE-OPERATIVE DIAGNOSIS:  RIGHT SHOULDER ROTATOR CUFF TEAR  POST-OPERATIVE DIAGNOSIS:  RIGHT SHOULDER ROTATOR CUFF TEAR  PROCEDURE:  Procedure(s): SHOULDER ARTHROSCOPY WITH ROTATOR CUFF REPAIR AND SUBACROMIAL DECOMPRESSION (Right)  SURGEON:  Surgeon(s) and Role:    * Earlie Server, MD - Primary  PHYSICIAN ASSISTANT: Chriss Czar, PA-C  ASSISTANTS:    ANESTHESIA:   regional and general  EBL:  50 mL   BLOOD ADMINISTERED:none  DRAINS: none   LOCAL MEDICATIONS USED:  NONE  SPECIMEN:  No Specimen  DISPOSITION OF SPECIMEN:  N/A  COUNTS:  YES  TOURNIQUET:  * No tourniquets in log *  DICTATION: .Other Dictation: Dictation Number unknown  PLAN OF CARE: Admit for overnight observation  PATIENT DISPOSITION:  PACU - hemodynamically stable.   Delay start of Pharmacological VTE agent (>24hrs) due to surgical blood loss or risk of bleeding: not applicable

## 2021-04-23 NOTE — Anesthesia Procedure Notes (Signed)
Anesthesia Regional Block: Interscalene brachial plexus block   Pre-Anesthetic Checklist: ,, timeout performed, Correct Patient, Correct Site, Correct Laterality, Correct Procedure, Correct Position, site marked, Risks and benefits discussed,  Surgical consent,  Pre-op evaluation,  At surgeon's request and post-op pain management  Laterality: Right  Prep: Maximum Sterile Barrier Precautions used, chloraprep       Needles:  Injection technique: Single-shot  Needle Type: Echogenic Stimulator Needle     Needle Length: 9cm  Needle Gauge: 22     Additional Needles:   Procedures:,,,, ultrasound used (permanent image in chart),,,,  Narrative:  Start time: 04/23/2021 9:50 AM End time: 04/23/2021 9:55 AM Injection made incrementally with aspirations every 5 mL.  Performed by: Personally  Anesthesiologist: Pervis Hocking, DO  Additional Notes: Monitors applied. No increased pain on injection. No increased resistance to injection. Injection made in 5cc increments. Good needle visualization. Patient tolerated procedure well.

## 2021-04-23 NOTE — Anesthesia Postprocedure Evaluation (Signed)
Anesthesia Post Note  Patient: Scarlett Portlock  Procedure(s) Performed: SHOULDER ARTHROSCOPY WITH ROTATOR CUFF REPAIR AND SUBACROMIAL DECOMPRESSION (Right Shoulder)     Patient location during evaluation: PACU Anesthesia Type: Regional and General Level of consciousness: awake and alert, oriented and patient cooperative Pain management: pain level controlled Vital Signs Assessment: post-procedure vital signs reviewed and stable Respiratory status: spontaneous breathing, nonlabored ventilation, patient connected to face mask oxygen and respiratory function unstable Cardiovascular status: blood pressure returned to baseline and stable Postop Assessment: no apparent nausea or vomiting Anesthetic complications: no Comments: Pt saturating 93% on 6L by facemask, desaturates to low 80s% on room air. Repeat CXR done in PACU reveals significant L lung disease- unable to determine if this is residual from her pleural effusion 3 months ago which required 1L thoracentesis at the time, as I am unable to access the patient's prior CXR from Feb 2022. Last CXR I have access to is from 2013, which looks completely normal and vastly different from her current CXR. Given likely phrenic blockade of her healthy lung from nerve block (operative side), I think she is unable to compensate with her diseased lung (left side). D/w Dr. French Ana and his PA that the patient would be safest if transferred to [facility] for closer monitoring in a monitored bed overnight. She also should be evaluated by the medicine team for potential repeat pleural effusion. I discussed all of this with the patient and the patient's daughter and answered all questions.   No complications documented.  Last Vitals:  Vitals:   04/23/21 1300 04/23/21 1315  BP: 119/62 123/66  Pulse: 78 80  Resp: 17 17  Temp:    SpO2: 95% 92%    Last Pain:  Vitals:   04/23/21 1315  TempSrc:   PainSc: 0-No pain                 Pervis Hocking

## 2021-04-23 NOTE — Transfer of Care (Signed)
Immediate Anesthesia Transfer of Care Note  Patient: Jillian Cooley  Procedure(s) Performed: SHOULDER ARTHROSCOPY WITH ROTATOR CUFF REPAIR AND SUBACROMIAL DECOMPRESSION (Right Shoulder)  Patient Location: PACU  Anesthesia Type:General  Level of Consciousness: drowsy  Airway & Oxygen Therapy: Patient Spontanous Breathing and Patient connected to face mask oxygen  Post-op Assessment: Report given to RN and Post -op Vital signs reviewed and stable  Post vital signs: Reviewed and stable  Last Vitals:  Vitals Value Taken Time  BP 112/57 04/23/21 1230  Temp    Pulse 75 04/23/21 1230  Resp 13 04/23/21 1230  SpO2 94 % 04/23/21 1230  Vitals shown include unvalidated device data.  Last Pain:  Vitals:   04/23/21 0845  TempSrc: Oral  PainSc: 0-No pain         Complications: No complications documented.

## 2021-04-23 NOTE — Anesthesia Procedure Notes (Signed)
Procedure Name: Intubation Date/Time: 04/23/2021 11:01 AM Performed by: [place] Axle Parfait C, CRNA Pre-anesthesia Checklist: Patient identified, Emergency Drugs available, Suction available and Patient being monitored Patient Re-evaluated:Patient Re-evaluated prior to induction Oxygen Delivery Method: Circle system utilized Preoxygenation: Pre-oxygenation with 100% oxygen Induction Type: IV induction Ventilation: Mask ventilation without difficulty Laryngoscope Size: Mac and 3 Grade View: Grade I Tube type: Oral Tube size: 7.0 mm Number of attempts: 1 Airway Equipment and Method: Stylet and Oral airway Placement Confirmation: ETT inserted through vocal cords under direct vision,  positive ETCO2 and breath sounds checked- equal and bilateral Secured at: 20 cm Tube secured with: Tape Dental Injury: Teeth and Oropharynx as per pre-operative assessment

## 2021-04-24 ENCOUNTER — Encounter (HOSPITAL_BASED_OUTPATIENT_CLINIC_OR_DEPARTMENT_OTHER): Payer: Self-pay | Admitting: Orthopedic Surgery

## 2021-04-24 ENCOUNTER — Observation Stay (HOSPITAL_COMMUNITY): Payer: Medicare PPO

## 2021-04-24 DIAGNOSIS — I5033 Acute on chronic diastolic (congestive) heart failure: Secondary | ICD-10-CM

## 2021-04-24 DIAGNOSIS — I272 Pulmonary hypertension, unspecified: Secondary | ICD-10-CM | POA: Diagnosis present

## 2021-04-24 DIAGNOSIS — I340 Nonrheumatic mitral (valve) insufficiency: Secondary | ICD-10-CM

## 2021-04-24 DIAGNOSIS — Z20822 Contact with and (suspected) exposure to covid-19: Secondary | ICD-10-CM | POA: Diagnosis present

## 2021-04-24 DIAGNOSIS — N1832 Chronic kidney disease, stage 3b: Secondary | ICD-10-CM | POA: Diagnosis present

## 2021-04-24 DIAGNOSIS — I82442 Acute embolism and thrombosis of left tibial vein: Secondary | ICD-10-CM | POA: Diagnosis not present

## 2021-04-24 DIAGNOSIS — R0602 Shortness of breath: Secondary | ICD-10-CM | POA: Diagnosis not present

## 2021-04-24 DIAGNOSIS — Z981 Arthrodesis status: Secondary | ICD-10-CM | POA: Diagnosis not present

## 2021-04-24 DIAGNOSIS — I351 Nonrheumatic aortic (valve) insufficiency: Secondary | ICD-10-CM

## 2021-04-24 DIAGNOSIS — J9601 Acute respiratory failure with hypoxia: Secondary | ICD-10-CM | POA: Diagnosis not present

## 2021-04-24 DIAGNOSIS — J90 Pleural effusion, not elsewhere classified: Secondary | ICD-10-CM | POA: Diagnosis not present

## 2021-04-24 DIAGNOSIS — Z9889 Other specified postprocedural states: Secondary | ICD-10-CM | POA: Diagnosis not present

## 2021-04-24 DIAGNOSIS — M75101 Unspecified rotator cuff tear or rupture of right shoulder, not specified as traumatic: Secondary | ICD-10-CM | POA: Diagnosis present

## 2021-04-24 DIAGNOSIS — Z85828 Personal history of other malignant neoplasm of skin: Secondary | ICD-10-CM | POA: Diagnosis not present

## 2021-04-24 DIAGNOSIS — I82461 Acute embolism and thrombosis of right calf muscular vein: Secondary | ICD-10-CM | POA: Diagnosis not present

## 2021-04-24 DIAGNOSIS — I130 Hypertensive heart and chronic kidney disease with heart failure and stage 1 through stage 4 chronic kidney disease, or unspecified chronic kidney disease: Secondary | ICD-10-CM | POA: Diagnosis present

## 2021-04-24 DIAGNOSIS — I081 Rheumatic disorders of both mitral and tricuspid valves: Secondary | ICD-10-CM | POA: Diagnosis present

## 2021-04-24 DIAGNOSIS — M25511 Pain in right shoulder: Secondary | ICD-10-CM | POA: Diagnosis present

## 2021-04-24 DIAGNOSIS — J9811 Atelectasis: Secondary | ICD-10-CM | POA: Diagnosis not present

## 2021-04-24 DIAGNOSIS — M5136 Other intervertebral disc degeneration, lumbar region: Secondary | ICD-10-CM | POA: Diagnosis present

## 2021-04-24 DIAGNOSIS — Z882 Allergy status to sulfonamides status: Secondary | ICD-10-CM | POA: Diagnosis not present

## 2021-04-24 LAB — ECHOCARDIOGRAM COMPLETE
AR max vel: 1.95 cm2
AV Area VTI: 1.6 cm2
AV Area mean vel: 1.75 cm2
AV Mean grad: 10 mmHg
AV Peak grad: 18.3 mmHg
Ao pk vel: 2.14 m/s
Area-P 1/2: 2.63 cm2
Height: 63 in
MV M vel: 5.75 m/s
MV Peak grad: 132.3 mmHg
MV VTI: 1.18 cm2
P 1/2 time: 214 msec
S' Lateral: 2.2 cm
Weight: 2229.29 oz

## 2021-04-24 LAB — BASIC METABOLIC PANEL
Anion gap: 10 (ref 5–15)
BUN: 45 mg/dL — ABNORMAL HIGH (ref 8–23)
CO2: 24 mmol/L (ref 22–32)
Calcium: 9.2 mg/dL (ref 8.9–10.3)
Chloride: 103 mmol/L (ref 98–111)
Creatinine, Ser: 1.77 mg/dL — ABNORMAL HIGH (ref 0.44–1.00)
GFR, Estimated: 28 mL/min — ABNORMAL LOW (ref >60)
Glucose, Bld: 125 mg/dL — ABNORMAL HIGH (ref 70–99)
Potassium: 4.1 mmol/L (ref 3.5–5.1)
Sodium: 137 mmol/L (ref 135–145)

## 2021-04-24 LAB — BRAIN NATRIURETIC PEPTIDE: B Natriuretic Peptide: 528.9 pg/mL — ABNORMAL HIGH (ref 0.0–100.0)

## 2021-04-24 LAB — PROCALCITONIN: Procalcitonin: <0.1 ng/mL

## 2021-04-24 MED ORDER — POLYETHYLENE GLYCOL 3350 17 G PO PACK
17.0000 g | PACK | Freq: Every day | ORAL | Status: DC
  Administered 2021-04-25 – 2021-04-26 (×2): 17 g via ORAL
  Filled 2021-04-24 (×2): qty 1

## 2021-04-24 MED ORDER — FUROSEMIDE 10 MG/ML IJ SOLN
40.0000 mg | Freq: Two times a day (BID) | INTRAMUSCULAR | Status: DC
  Administered 2021-04-24 – 2021-04-25 (×2): 40 mg via INTRAVENOUS
  Filled 2021-04-24 (×2): qty 4

## 2021-04-24 MED ORDER — HEPARIN SODIUM (PORCINE) 5000 UNIT/ML IJ SOLN
5000.0000 [IU] | Freq: Three times a day (TID) | INTRAMUSCULAR | Status: DC
  Administered 2021-04-24 – 2021-04-26 (×4): 5000 [IU] via SUBCUTANEOUS
  Filled 2021-04-24 (×4): qty 1

## 2021-04-24 NOTE — Op Note (Signed)
NAMELASHAWNDA, HANCOX MEDICAL RECORD NO: [id number] ACCOUNT NO: [id number] DATE OF BIRTH: 1932-02-21 FACILITY: [facility] LOCATION: [facility] PHYSICIAN: W D. Valeta Harms., MD  Operative Report   DATE OF PROCEDURE: 04/23/2021  PREOPERATIVE DIAGNOSIS:     1.  Severe interstitial supraspinatus tendon tear and infraspinatus tendon tear. 2.  Crystalline arthropathy. 3.  Impingement. 4.  Acromioclavicular arthritis. 5.  Degenerative tearing anterior, superior, inferior labrum. 6.  Biceps synovitis.  POSTOPERATIVE DIAGNOSIS:   1.  Severe interstitial supraspinatus tendon tear and infraspinatus tendon tear. 2.  Crystalline arthropathy. 3.  Impingement. 4.  Acromioclavicular arthritis. 5.  Degenerative tearing anterior, superior, inferior labrum. 6.  Biceps synovitis.  OPERATION:  1.  Open rotator cuff repair acromioplasty (chronic).  2.  Open distal clavicle excision.   3.  Arthroscopic debridement (extensive of labrum, biceps and glenohumeral joint).  SURGEON: W D. Valeta Harms., MD  ASSISTANTMarjo Bicker.  General with a block.  BLOOD LOSS:  Minimal.  DESCRIPTION OF PROCEDURE:  McMullen chair position and posterior anterior portals created.  Systematic inspection of the shoulder showed the patient to have a significant crystalline arthropathy with very little changes relative to articular cartilage  damage, but extensive crystalline material was debrided off the joint surfaces.  This was associated with significant tearing of the anterior, superior, posterior labrum, which was aggressively debrided as well as significant synovitis around the biceps.   Synovectomy of the biceps was carried out arthroscopically as well as debridement of the glenohumeral joint as well as debridement of the labrum.  Somewhat more of an interstitial tear was noted and described on the MRI with a large stump of tendon on  the supraspinatus and infraspinatus.  She actually had a complete two tendon tear.  We elected  then to open the shoulder with an incision, bisecting the acromial AC interval.  We did an excision of the distal clavicle.  A small amount of acromion was  removed with a release of the CA ligament, revealing the tear, we were able to repair the supraspinatus side-to-side with an Arthrex SutureTape.  The infraspinatus tendon was not repairable.  The deltoid had been split.  It was reapproximated with a #1  Ti-Cron, subcutaneous tissues with 2-0 Vicryl and the skin with Monocryl.  Taken to recovery room in stable condition.     SUJ D: 04/23/2021 12:07:46 pm T: 04/24/2021 2:43:00 am  JOB: [id number]/ [id number]

## 2021-04-24 NOTE — Progress Notes (Signed)
PROGRESS NOTE    Jillian Cooley   CBS:[id number]  DOB: March 11, 1932  PCP: [name], MD    DOA: 04/23/2021 LOS: 0   Brief Narrative   87 year old female patient with past medical history of moderate aortic/mitral/tricuspid regurgitation, HTN, CKD stage III, right shoulder pain awaiting surgery x 3 months with pre-op evaluation revealing for valvular heart disease and a pleural effusion.  She presented on 04/23/21 for elective right shoulder surgery, and post-operatively developed acute respiratory failure with hypoxia requiring 4-6 L/min supplemental oxygen.    Workup for etiology of hypoxia appears to be volume overload with CHF/valvular heart disease, fluids given in surgery, nerve block for upper extremity ?may affect inspirations as well.  No evidence for infection including fever/chills, cough, leukocytosis.  Chest x-ray showed pulmonary congestion and worsening of left-sided pleural effusion.  She is undergoing diuresis.  Thoracentesis tentatively planned if IR feels adequate fluid to do safely.     Assessment & Plan   Active Problems:   S/P right rotator cuff repair   S/P arthroscopy of shoulder   CHF (congestive heart failure) (HCC)   Acute respiratory failure with hypoxia due to Volume overload in setting of Valvular heart disease and Severe Pulmonary HTN and Acute on Chronic HFpEF with  Recurrent Left Pleural Effusion -   BNP elevated, Procal normal - consistent with our assessment, makes infection more unlikely.  CHF, valve dz and pleural effusion just recently diagnosed in pre-op assessment for shoulder surgery which had originally been planned for February, was delayed until now.  Echo obtained shows normal EF 65-70%, grade II diastolic dysfunction, severely elevated PASP, small pericardial effusion without signs of tamponade, moderate pleural effusion, mod-severe MR, mild-mod AR.  PLAN -  --daily weights --continue IV Lasix --Left thoracentesis  under U/S if adequate fluid --Likely d/c on PRN Lasix for wt gain > 2 lbs in one day or 5 lbs in 3 days or increased edema --O2 as needed to keep sats 90-95%, wean as tolerated --Low suspicion for PE at this time, but will evaluate further if not improving with above  CKD Stage IIIb - unclear baseline renal function, expect near baseline.  Monitor renal function closely with diuresis.  Hypertension - takes amlodipine and losartan, continued. Monitor BP with diuresis and scale these back if needed.  S/p Right Rotator Cuff Repair Surgery - on 5/4.  Management per primary, Orthopedic service.   Patient BMI: Body mass index is 24.68 kg/m.   DVT prophylaxis: heparin injection 5,000 Units Start: 04/24/21 2200   Diet:  Diet Orders (From admission, onward)    Start     Ordered   04/23/21 1532  Diet regular Room service appropriate? Yes; Fluid consistency: Thin  Diet effective now       Question Answer Comment  Room service appropriate? Yes   Fluid consistency: Thin      04/23/21 1531            Code Status: Full Code    Subjective 04/24/21    Pt up in recliner, husband and daughter at bedside.  She reports feeling better today.  Had a thoracentesis when her pleural effusion was first discovered a few months ago, agrees with another here if it will help.  No shoulder pain due to nerve block still in effect.  She denies recently feeling ill including fever/chills, cough, congestion.   Disposition Plan & Communication   Status is: Inpatient  Inpatient appropriate due to ongoing new hypoxia.   Dispo: The  patient is from: home              Anticipated d/c is to: home              Patient currently not medically stable for d/c.   Difficult to place patient No  Family Communication: husband and daughter at bedside on rounds   Consults, Procedures, Significant Events   Consultants:   [facility] / Hospitalists  Ortho is primary  Procedures:   5/4 - Rotator Cuff repair  surgery  Antimicrobials:  Anti-infectives (From admission, onward)   Start     Dose/Rate Route Frequency Ordered Stop   04/23/21 0830  ceFAZolin (ANCEF) IVPB 2g/100 mL premix        2 g 200 mL/hr over 30 Minutes Intravenous On call to O.R. 04/23/21 0825 04/23/21 1104        Micro    Objective   Vitals:   04/23/21 1759 04/23/21 2116 04/24/21 0429 04/24/21 0737  BP: 138/74 (!) 145/70 (!) 141/65 132/62  Pulse: 89 91 80 81  Resp:  18 19 17   Temp:  98.1 F (36.7 C) 98.4 F (36.9 C) 98.5 F (36.9 C)  TempSrc:  Oral Oral Oral  SpO2: 93% 99% 92% 95%  Weight:      Height:       No intake or output data in the 24 hours ending 04/24/21 1746 Filed Weights   04/17/21 1232 04/23/21 0845  Weight: 63 kg 63.2 kg    Physical Exam:  General exam: awake, alert, no acute distress HEENT: moist mucus membranes, hearing grossly normal  Respiratory system: CTAB, diminished left base, no wheezes, rales or rhonchi, normal respiratory effort, on 4 L/min nasal cannula oxygen. Cardiovascular system: normal S1/S2, RRR, L>R lower extremity edema (chronically asymmetric per pt and family).   Gastrointestinal system: soft, non-tender Central nervous system: A&O x4. no gross focal neurologic deficits, normal speech Psychiatry: normal mood, congruent affect, judgement and insight appear normal  Labs   Data Reviewed: I have personally reviewed following labs and imaging studies  CBC: No results for input(s): WBC, NEUTROABS, HGB, HCT, MCV, PLT in the last 168 hours. Basic Metabolic Panel: Recent Labs  Lab 04/23/21 1859 04/24/21 0331  NA 136 137  K 4.0 4.1  CL 110 103  CO2 20* 24  GLUCOSE 205* 125*  BUN 48* 45*  CREATININE 1.84* 1.77*  CALCIUM 8.8* 9.2   GFR: Estimated Creatinine Clearance: 20.4 mL/min (A) (by C-G formula based on SCr of 1.77 mg/dL (H)). Liver Function Tests: No results for input(s): AST, ALT, ALKPHOS, BILITOT, PROT, ALBUMIN in the last 168 hours. No results for  input(s): LIPASE, AMYLASE in the last 168 hours. No results for input(s): AMMONIA in the last 168 hours. Coagulation Profile: No results for input(s): INR, PROTIME in the last 168 hours. Cardiac Enzymes: No results for input(s): CKTOTAL, CKMB, CKMBINDEX, TROPONINI in the last 168 hours. BNP (last 3 results) Recent Labs    02/28/21 1109  PROBNP 1,545*   HbA1C: No results for input(s): HGBA1C in the last 72 hours. CBG: No results for input(s): GLUCAP in the last 168 hours. Lipid Profile: No results for input(s): CHOL, HDL, LDLCALC, TRIG, CHOLHDL, LDLDIRECT in the last 72 hours. Thyroid Function Tests: No results for input(s): TSH, T4TOTAL, FREET4, T3FREE, THYROIDAB in the last 72 hours. Anemia Panel: No results for input(s): VITAMINB12, FOLATE, FERRITIN, TIBC, IRON, RETICCTPCT in the last 72 hours. Sepsis Labs: Recent Labs  Lab 04/24/21 0920  PROCALCITON <0.10  Recent Results (from the past 240 hour(s))  SARS CORONAVIRUS 2 (TAT 6-24 HRS) Nasopharyngeal Nasopharyngeal Swab     Status: None   Collection Time: 04/21/21  8:36 AM   Specimen: Nasopharyngeal Swab  Result Value Ref Range Status   SARS Coronavirus 2 NEGATIVE NEGATIVE Final    Comment: (NOTE) SARS-CoV-2 target nucleic acids are NOT DETECTED.  The SARS-CoV-2 RNA is generally detectable in upper and lower respiratory specimens during the acute phase of infection. Negative results do not preclude SARS-CoV-2 infection, do not rule out co-infections with other pathogens, and should not be used as the sole basis for treatment or other patient management decisions. Negative results must be combined with clinical observations, patient history, and epidemiological information. The expected result is Negative.  Fact Sheet for Patients: SugarRoll.be  Fact Sheet for Healthcare Providers: https://www.woods-mathews.com/  This test is not yet approved or cleared by the [place]  FDA and  has been authorized for detection and/or diagnosis of SARS-CoV-2 by FDA under an Emergency Use Authorization (EUA). This EUA will remain  in effect (meaning this test can be used) for the duration of the COVID-19 declaration under Se ction 564(b)(1) of the Act, 21 U.S.C. section 360bbb-3(b)(1), unless the authorization is terminated or revoked sooner.  Performed at [facility], [place] [address], [place],  [address]       Imaging Studies   DG Chest 1 View  Result Date: 04/24/2021 CLINICAL DATA:  Shortness of breath. EXAM: CHEST  1 VIEW COMPARISON:  04/23/2021. FINDINGS: Heart size normal. Normal pulmonary vascularity. Left mid and lower lung infiltrates/edema with associated mild to moderate left pleural effusion again noted. Mild right basilar atelectasis. No pneumothorax. No acute bony abnormality. Prior cervical spine fusion. IMPRESSION: Left mid and lower lung infiltrates/edema associated mild to moderate left pleural effusion again noted. Mild right basilar atelectasis. Electronically Signed   By: Marcello Moores  Register   On: 04/24/2021 05:52   ECHOCARDIOGRAM COMPLETE  Result Date: 04/24/2021    ECHOCARDIOGRAM REPORT   Patient Name:   Jillian Cooley Date of Exam: 04/24/2021 Medical Rec #:  [id number]             Height:       63.0 in Accession #:    [id number]            Weight:       139.3 lb Date of Birth:  02-Aug-1932             BSA:          1.658 m Patient Age:    84 years              BP:           132/62 mmHg Patient Gender: F                     HR:           93 bpm. Exam Location:  Inpatient Procedure: 2D Echo Indications:    Mitral Valve Disorder; Hypoxia post shoulder surgery ? Volume                 overload. Hx of valvular heart disease  History:        Patient has prior history of Echocardiogram examinations, most                 recent 01/23/2021. Risk Factors:Hypertension.  Sonographer:    Mikki Santee RDCS (AE) Referring Phys: [id number] [facility]  A [place] Vanden[place]uvel  IMPRESSIONS  1. Right ventricular systolic function is normal. The right ventricular size is normal. There is severely elevated pulmonary artery systolic pressure. The estimated right ventricular systolic pressure is 62.9 mmHg.  2. Left ventricular ejection fraction, by estimation, is 65 to 70%. The left ventricle has normal function. The left ventricle has no regional wall motion abnormalities. Left ventricular diastolic parameters are consistent with Grade II diastolic dysfunction (pseudonormalization). Elevated left atrial pressure.  3. Left atrial size was moderately dilated.  4. A small pericardial effusion is present. There is no evidence of cardiac tamponade. Moderate pleural effusion.  5. The mitral valve is grossly normal. Moderate mitral valve regurgitation; may be underestimated in this study.  6. The aortic valve is tricuspid. Aortic valve regurgitation is mild to moderate and eccentric. Mild to moderate aortic valve sclerosis/calcification is present, without any evidence of aortic stenosis.  7. The inferior vena cava is dilated in size with <50% respiratory variability, suggesting right atrial pressure of 15 mmHg. Comparison(s): No prior Echocardiogram. Compared to chart report- new pericardial effusion and pleural effusion. FINDINGS  Left Ventricle: Left ventricular ejection fraction, by estimation, is 65 to 70%. The left ventricle has normal function. The left ventricle has no regional wall motion abnormalities. The left ventricular internal cavity size was normal in size. There is  no left ventricular hypertrophy. Left ventricular diastolic parameters are consistent with Grade II diastolic dysfunction (pseudonormalization). Elevated left atrial pressure. Right Ventricle: The right ventricular size is normal. No increase in right ventricular wall thickness. Right ventricular systolic function is normal. There is severely elevated pulmonary artery systolic pressure. The tricuspid regurgitant velocity  is 3.76 m/s, and with an assumed right atrial pressure of 15 mmHg, the estimated right ventricular systolic pressure is 52.8 mmHg. Left Atrium: Left atrial size was moderately dilated. Right Atrium: Right atrial size was normal in size. Pericardium: A small pericardial effusion is present. There is no evidence of cardiac tamponade. Mitral Valve: The mitral valve is grossly normal. Moderate mitral valve regurgitation. MV peak gradient, 16.8 mmHg. The mean mitral valve gradient is 9.0 mmHg. Tricuspid Valve: The tricuspid valve is normal in structure. Tricuspid valve regurgitation is mild . No evidence of tricuspid stenosis. Aortic Valve: The aortic valve is tricuspid. Aortic valve regurgitation is mild to moderate. Aortic regurgitation PHT measures 214 msec. Mild to moderate aortic valve sclerosis/calcification is present, without any evidence of aortic stenosis. Aortic valve mean gradient measures 10.0 mmHg. Aortic valve peak gradient measures 18.3 mmHg. Aortic valve area, by VTI measures 1.60 cm. Pulmonic Valve: The pulmonic valve was not well visualized. Pulmonic valve regurgitation is not visualized. No evidence of pulmonic stenosis. Aorta: The aortic root is normal in size and structure. Venous: The inferior vena cava is dilated in size with less than 50% respiratory variability, suggesting right atrial pressure of 15 mmHg. IAS/Shunts: The atrial septum is grossly normal. Additional Comments: There is a moderate pleural effusion.  LEFT VENTRICLE PLAX 2D LVIDd:         3.90 cm  Diastology LVIDs:         2.20 cm  LV e' medial:    4.24 cm/s LV PW:         1.10 cm  LV E/e' medial:  42.0 LV IVS:        0.90 cm  LV e' lateral:   4.68 cm/s LVOT diam:     2.00 cm  LV E/e' lateral: 38.0 LV SV:  68 LV SV Index:   41 LVOT Area:     3.14 cm  RIGHT VENTRICLE RV S prime:     10.70 cm/s TAPSE (M-mode): 1.8 cm LEFT ATRIUM             Index       RIGHT ATRIUM           Index LA diam:        3.70 cm 2.23 cm/m  RA Area:      15.90 cm LA Vol (A2C):   70.1 ml 42.27 ml/m RA Volume:   39.60 ml  23.88 ml/m LA Vol (A4C):   62.0 ml 37.39 ml/m LA Biplane Vol: 67.1 ml 40.46 ml/m  AORTIC VALVE AV Area (Vmax):    1.95 cm AV Area (Vmean):   1.75 cm AV Area (VTI):     1.60 cm AV Vmax:           214.00 cm/s AV Vmean:          147.000 cm/s AV VTI:            0.425 m AV Peak Grad:      18.3 mmHg AV Mean Grad:      10.0 mmHg LVOT Vmax:         133.00 cm/s LVOT Vmean:        82.000 cm/s LVOT VTI:          0.216 m LVOT/AV VTI ratio: 0.51 AI PHT:            214 msec  AORTA Ao Root diam: 2.70 cm MITRAL VALVE                TRICUSPID VALVE MV Area (PHT): 2.63 cm     TR Peak grad:   56.6 mmHg MV Area VTI:   1.18 cm     TR Vmax:        376.00 cm/s MV Peak grad:  16.8 mmHg MV Mean grad:  9.0 mmHg     SHUNTS MV Vmax:       2.05 m/s     Systemic VTI:  0.22 m MV Vmean:      143.0 cm/s   Systemic Diam: 2.00 cm MV Decel Time: 288 msec MR Peak grad: 132.2 mmHg MR Mean grad: 87.0 mmHg MR Vmax:      575.00 cm/s MR Vmean:     450.0 cm/s MV E velocity: 178.00 cm/s MV A velocity: 148.00 cm/s MV E/A ratio:  1.20 Rudean Haskell MD Electronically signed by Rudean Haskell MD Signature Date/Time: 04/24/2021/5:29:18 PM    Final      Medications   Scheduled Meds: . amLODipine  5 mg Oral Daily  . docusate sodium  100 mg Oral BID  . furosemide  40 mg Intravenous BID  . heparin  5,000 Units Subcutaneous Q8H  . losartan  25 mg Oral Daily  . simvastatin  20 mg Oral Daily   Continuous Infusions:     LOS: 0 days    Time spent: 30 minutes    Ezekiel Slocumb, DO [facility]  04/24/2021, 5:46 PM      If 7PM-7AM, please contact night-coverage. How to contact the [facility] Attending or Consulting provider [place] or covering provider during after hours [place], for this patient?    1. Check the care team in [facility] and look for a) attending/consulting [facility] provider listed and b) the [facility] team listed 2. Log into [email or web address] and use [facility]'s  universal password to access. If you do not have the password, please contact the hospital operator. 3. Locate the [facility] provider you are looking for under [facility] and page to a number that you can be directly reached. 4. If you still have difficulty reaching the provider, please page the [facility] [address] (Director on Call) for the Hospitalists listed on amion for assistance.

## 2021-04-24 NOTE — Hospital Course (Signed)
87 year old female patient with past medical history of moderate aortic/mitral/tricuspid regurgitation, HTN, CKD stage III, right shoulder pain awaiting surgery x 3 months with pre-op evaluation revealing for valvular heart disease and a pleural effusion.  She presented on 04/23/21 for elective right shoulder surgery, and post-operatively developed acute respiratory failure with hypoxia requiring 4-6 L/min supplemental oxygen.    Workup for etiology of hypoxia appears to be volume overload with CHF/valvular heart disease, fluids given in surgery, nerve block for upper extremity ?may affect inspirations as well.  No evidence for infection including fever/chills, cough, leukocytosis.  Chest x-ray showed pulmonary congestion and worsening of left-sided pleural effusion.  She is undergoing diuresis.  Thoracentesis tentatively planned if IR feels adequate fluid to do safely.

## 2021-04-24 NOTE — Progress Notes (Signed)
Subjective: 1 Day Post-Op Procedure(s) (LRB): SHOULDER ARTHROSCOPY WITH ROTATOR CUFF REPAIR AND SUBACROMIAL DECOMPRESSION (Right) Patient reports pain as mild.  On 4L [place] sitting up in chair.  Objective: Vital signs in last 24 hours: Temp:  [97.4 F (36.3 C)-98.5 F (36.9 C)] 98.5 F (36.9 C) (05/05 0737) Pulse Rate:  [74-91] 81 (05/05 0737) Resp:  [17-19] 17 (05/05 0737) BP: (132-145)/(62-74) 132/62 (05/05 0737) SpO2:  [92 %-99 %] 95 % (05/05 0737)  Intake/Output from previous day: 05/04 0701 - 05/05 0700 In: 800 [I.V.:700; IV Piggyback:100] Out: 50 [Blood:50] Intake/Output this shift: No intake/output data recorded.  No results for input(s): HGB in the last 72 hours. No results for input(s): WBC, RBC, HCT, PLT in the last 72 hours. Recent Labs    04/23/21 1859 04/24/21 0331  NA 136 137  K 4.0 4.1  CL 110 103  CO2 20* 24  BUN 48* 45*  CREATININE 1.84* 1.77*  GLUCOSE 205* 125*  CALCIUM 8.8* 9.2   No results for input(s): LABPT, INR in the last 72 hours.  Neurovascular intact Incision: dressing C/D/I   Assessment/Plan: 1 Day Post-Op Procedure(s) (LRB): SHOULDER ARTHROSCOPY WITH ROTATOR CUFF REPAIR AND SUBACROMIAL DECOMPRESSION (Right) Acute respiratory failure recurrent pleural effusion  Regarding the shoulder pt will be ready for d/c home once medically cleared Pleural effusion with acute hypoxia recommendations per medicine team.  Chest xray this AM still showing left sided pleural effusion.     Vonna Kotyk Timica Marcom 04/24/2021, 2:07 PM

## 2021-04-25 ENCOUNTER — Inpatient Hospital Stay (HOSPITAL_COMMUNITY): Payer: Medicare PPO

## 2021-04-25 DIAGNOSIS — Z9889 Other specified postprocedural states: Secondary | ICD-10-CM

## 2021-04-25 HISTORY — PX: IR THORACENTESIS ASP PLEURAL SPACE W/IMG GUIDE: IMG5380

## 2021-04-25 LAB — BASIC METABOLIC PANEL
Anion gap: 8 (ref 5–15)
BUN: 47 mg/dL — ABNORMAL HIGH (ref 8–23)
CO2: 25 mmol/L (ref 22–32)
Calcium: 9 mg/dL (ref 8.9–10.3)
Chloride: 104 mmol/L (ref 98–111)
Creatinine, Ser: 1.85 mg/dL — ABNORMAL HIGH (ref 0.44–1.00)
GFR, Estimated: 26 mL/min — ABNORMAL LOW (ref >60)
Glucose, Bld: 106 mg/dL — ABNORMAL HIGH (ref 70–99)
Potassium: 3.6 mmol/L (ref 3.5–5.1)
Sodium: 137 mmol/L (ref 135–145)

## 2021-04-25 LAB — D-DIMER, QUANTITATIVE: D-Dimer, Quant: 2.76 ug/mL-FEU — ABNORMAL HIGH (ref 0.00–0.50)

## 2021-04-25 MED ORDER — FUROSEMIDE 10 MG/ML IJ SOLN: 40.0000 mg | Freq: Every day | INTRAMUSCULAR | Status: DC

## 2021-04-25 MED ORDER — LIDOCAINE HCL 1 % IJ SOLN
INTRAMUSCULAR | Status: AC
  Filled 2021-04-25: qty 20

## 2021-04-25 MED ORDER — LIDOCAINE HCL (PF) 1 % IJ SOLN
INTRAMUSCULAR | Status: DC | PRN
  Administered 2021-04-25: 10 mL

## 2021-04-25 MED ORDER — TECHNETIUM TO 99M ALBUMIN AGGREGATED
4.2000 | Freq: Once | INTRAVENOUS | Status: AC | PRN
  Administered 2021-04-25: 4.2 via INTRAVENOUS

## 2021-04-25 NOTE — Progress Notes (Signed)
Bilateral lower extremity venous duplex complete.  Critical findings reported to Dr. Nicole Kindred @ 15:00.  Please see CV proc tab for preliminary results. Lita Mains- RDMS, RVT 3:03 PM  04/25/2021

## 2021-04-25 NOTE — Procedures (Signed)
PROCEDURE SUMMARY:  Successful image-guided left thoracentesis. Yielded 800 cc of clear amber  fluid. Pt tolerated procedure well. No immediate complications. EBL = trace   Specimen was not sent for labs. CXR ordered.  Please see imaging section of Epic for full dictation.  Armando Gang Zygmund Passero PA-C 04/25/2021 3:19 PM

## 2021-04-25 NOTE — Progress Notes (Signed)
PROGRESS NOTE    Jillian Cooley   BHA:[id number]  DOB: 31-Dec-1931  PCP: [name], MD    DOA: 04/23/2021 LOS: 1   Brief Narrative   87 year old female patient with past medical history of moderate aortic/mitral/tricuspid regurgitation, HTN, CKD stage III, right shoulder pain awaiting surgery x 3 months with pre-op evaluation revealing for valvular heart disease and a pleural effusion.  She presented on 04/23/21 for elective right shoulder surgery, and post-operatively developed acute respiratory failure with hypoxia requiring 4-6 L/min supplemental oxygen.    Workup for etiology of hypoxia appears to be volume overload with CHF/valvular heart disease, fluids given in surgery, nerve block for upper extremity ?may affect inspirations as well.  No evidence for infection including fever/chills, cough, leukocytosis.  Chest x-ray showed pulmonary congestion and worsening of left-sided pleural effusion.  She is undergoing diuresis.  Thoracentesis tentatively planned if IR feels adequate fluid to do safely.     Assessment & Plan   Active Problems:   S/P right rotator cuff repair   S/P arthroscopy of shoulder   CHF (congestive heart failure) (HCC)   Acute respiratory failure with hypoxia due to Volume overload in setting of Valvular heart disease and Severe Pulmonary HTN and Acute on Chronic HFpEF with  Recurrent Left Pleural Effusion -   BNP elevated, Procal normal - consistent with our assessment, makes infection more unlikely.  D-dimer is elevated but could be secondary to her renal insufficiency and just undergoing surgery.    CHF, valve dz and pleural effusion just recently diagnosed in pre-op assessment for shoulder surgery which had originally been planned for February, was delayed until now.  Echo obtained shows normal EF 65-70%, grade II diastolic dysfunction, severely elevated PASP, small pericardial effusion without signs of tamponade, moderate pleural effusion,  mod-severe MR, mild-mod AR.  PLAN -  --daily weights --continue IV Lasix -reduce back to daily --Left thoracentesis pending for today --D-dimer elevated, will get lower extremity duplex ultrasound to evaluate for DVT --Unable to get CTA chest given renal function, still fairly low suspicion for PE, consider VQ scan --Likely d/c on PRN Lasix for wt gain > 2 lbs in one day or 5 lbs in 3 days or increased edema --O2 as needed to keep sats 90-95%, wean as tolerated   CKD Stage IIIb - unclear baseline renal function, expect near baseline.  Monitor renal function closely with diuresis.  Hypertension - takes amlodipine and losartan, continued. Monitor BP with diuresis and scale these back if needed.  S/p Right Rotator Cuff Repair Surgery - on 5/4.  Management per primary, Orthopedic service.   Patient BMI: Body mass index is 23.86 kg/m.   DVT prophylaxis: heparin injection 5,000 Units Start: 04/24/21 2200   Diet:  Diet Orders (From admission, onward)    Start     Ordered   04/23/21 1532  Diet regular Room service appropriate? Yes; Fluid consistency: Thin  Diet effective now       Question Answer Comment  Room service appropriate? Yes   Fluid consistency: Thin      04/23/21 1531            Code Status: Full Code    Subjective 04/25/21    Pt up in chair when seen, husband and daughter at bedside.  Patient continues to feel well.  Denying much pain in the shoulder yet is having some itching sensation however.  Felt a little short of breath on exertion getting to echo yesterday, denies today with transfer  from bed to chair.  Discussed plan for evaluation today patient family in agreement.   Disposition Plan & Communication   Status is: Inpatient  Inpatient appropriate due to ongoing new hypoxia under evaluation.   Dispo: The patient is from: home              Anticipated d/c is to: home              Patient currently not medically stable for d/c.   Difficult to place  patient No  Family Communication: husband and daughter at bedside on rounds   Consults, Procedures, Significant Events   Consultants:   TRH / Hospitalists  Ortho is primary  Procedures:   5/4 - Rotator Cuff repair surgery  Antimicrobials:  Anti-infectives (From admission, onward)   Start     Dose/Rate Route Frequency Ordered Stop   04/23/21 0830  ceFAZolin (ANCEF) IVPB 2g/100 mL premix        2 g 200 mL/hr over 30 Minutes Intravenous On call to O.R. 04/23/21 0825 04/23/21 1104        Micro    Objective   Vitals:   04/24/21 2003 04/25/21 0439 04/25/21 0800 04/25/21 1133  BP: 134/60 139/64 126/60   Pulse: 95 79 77   Resp: 18 18 16    Temp: 99.9 F (37.7 C) 99.2 F (37.3 C) 97.9 F (36.6 C)   TempSrc: Oral Oral Oral   SpO2: 95% 94% 93%   Weight:    61.1 kg  Height:       No intake or output data in the 24 hours ending 04/25/21 1439 Filed Weights   04/17/21 1232 04/23/21 0845 04/25/21 1133  Weight: 63 kg 63.2 kg 61.1 kg    Physical Exam:  General exam: awake, alert, no acute distress Respiratory system: CTAB, bases diminished L>R, no wheezes or rhonchi, normal respiratory effort, on 4 L/min nasal cannula oxygen with sats 94 to 98%. Cardiovascular system: normal S1/S2, RRR, lower extremity edema resolved (skin wrinkling now) Central nervous system: A&O x4. no gross focal neurologic deficits, normal speech  Labs   Data Reviewed: I have personally reviewed following labs and imaging studies  CBC: No results for input(s): WBC, NEUTROABS, HGB, HCT, MCV, PLT in the last 168 hours. Basic Metabolic Panel: Recent Labs  Lab 04/23/21 1859 04/24/21 0331 04/25/21 0124  NA 136 137 137  K 4.0 4.1 3.6  CL 110 103 104  CO2 20* 24 25  GLUCOSE 205* 125* 106*  BUN 48* 45* 47*  CREATININE 1.84* 1.77* 1.85*  CALCIUM 8.8* 9.2 9.0   GFR: Estimated Creatinine Clearance: 18.1 mL/min (A) (by C-G formula based on SCr of 1.85 mg/dL (H)). Liver Function Tests: No  results for input(s): AST, ALT, ALKPHOS, BILITOT, PROT, ALBUMIN in the last 168 hours. No results for input(s): LIPASE, AMYLASE in the last 168 hours. No results for input(s): AMMONIA in the last 168 hours. Coagulation Profile: No results for input(s): INR, PROTIME in the last 168 hours. Cardiac Enzymes: No results for input(s): CKTOTAL, CKMB, CKMBINDEX, TROPONINI in the last 168 hours. BNP (last 3 results) Recent Labs    02/28/21 1109  PROBNP 1,545*   HbA1C: No results for input(s): HGBA1C in the last 72 hours. CBG: No results for input(s): GLUCAP in the last 168 hours. Lipid Profile: No results for input(s): CHOL, HDL, LDLCALC, TRIG, CHOLHDL, LDLDIRECT in the last 72 hours. Thyroid Function Tests: No results for input(s): TSH, T4TOTAL, FREET4, T3FREE, THYROIDAB in the last 72 hours.  Anemia Panel: No results for input(s): VITAMINB12, FOLATE, FERRITIN, TIBC, IRON, RETICCTPCT in the last 72 hours. Sepsis Labs: Recent Labs  Lab 04/24/21 0920  PROCALCITON <0.10    Recent Results (from the past 240 hour(s))  SARS CORONAVIRUS 2 (TAT 6-24 HRS) Nasopharyngeal Nasopharyngeal Swab     Status: None   Collection Time: 04/21/21  8:36 AM   Specimen: Nasopharyngeal Swab  Result Value Ref Range Status   SARS Coronavirus 2 NEGATIVE NEGATIVE Final    Comment: (NOTE) SARS-CoV-2 target nucleic acids are NOT DETECTED.  The SARS-CoV-2 RNA is generally detectable in upper and lower respiratory specimens during the acute phase of infection. Negative results do not preclude SARS-CoV-2 infection, do not rule out co-infections with other pathogens, and should not be used as the sole basis for treatment or other patient management decisions. Negative results must be combined with clinical observations, patient history, and epidemiological information. The expected result is Negative.  Fact Sheet for Patients: SugarRoll.be  Fact Sheet for Healthcare  Providers: https://www.woods-mathews.com/  This test is not yet approved or cleared by the [place] FDA and  has been authorized for detection and/or diagnosis of SARS-CoV-2 by FDA under an Emergency Use Authorization (EUA). This EUA will remain  in effect (meaning this test can be used) for the duration of the COVID-19 declaration under Se ction 564(b)(1) of the Act, 21 U.S.C. section 360bbb-3(b)(1), unless the authorization is terminated or revoked sooner.  Performed at [facility], [place] [address], [place], [place] [address]       Imaging Studies   DG Chest 1 View  Result Date: 04/24/2021 CLINICAL DATA:  Shortness of breath. EXAM: CHEST  1 VIEW COMPARISON:  04/23/2021. FINDINGS: Heart size normal. Normal pulmonary vascularity. Left mid and lower lung infiltrates/edema with associated mild to moderate left pleural effusion again noted. Mild right basilar atelectasis. No pneumothorax. No acute bony abnormality. Prior cervical spine fusion. IMPRESSION: Left mid and lower lung infiltrates/edema associated mild to moderate left pleural effusion again noted. Mild right basilar atelectasis. Electronically Signed   By: Marcello Moores  Register   On: 04/24/2021 05:52   ECHOCARDIOGRAM COMPLETE  Result Date: 04/24/2021    ECHOCARDIOGRAM REPORT   Patient Name:   AALEYAH WITHEROW Date of Exam: 04/24/2021 Medical Rec #:  [id number]             Height:       63.0 in Accession #:    [id number]            Weight:       139.3 lb Date of Birth:  05-21-1932             BSA:          1.658 m Patient Age:    59 years              BP:           132/62 mmHg Patient Gender: F                     HR:           93 bpm. Exam Location:  Inpatient Procedure: 2D Echo Indications:    Mitral Valve Disorder; Hypoxia post shoulder surgery ? Volume                 overload. Hx of valvular heart disease  History:        Patient has prior history of Echocardiogram examinations, most  recent  01/23/2021. Risk Factors:Hypertension.  Sonographer:    Mikki Santee RDCS (AE) Referring Phys: [id number] Kay  1. Right ventricular systolic function is normal. The right ventricular size is normal. There is severely elevated pulmonary artery systolic pressure. The estimated right ventricular systolic pressure is 84.5 mmHg.  2. Left ventricular ejection fraction, by estimation, is 65 to 70%. The left ventricle has normal function. The left ventricle has no regional wall motion abnormalities. Left ventricular diastolic parameters are consistent with Grade II diastolic dysfunction (pseudonormalization). Elevated left atrial pressure.  3. Left atrial size was moderately dilated.  4. A small pericardial effusion is present. There is no evidence of cardiac tamponade. Moderate pleural effusion.  5. The mitral valve is grossly normal. Moderate mitral valve regurgitation; may be underestimated in this study.  6. The aortic valve is tricuspid. Aortic valve regurgitation is mild to moderate and eccentric. Mild to moderate aortic valve sclerosis/calcification is present, without any evidence of aortic stenosis.  7. The inferior vena cava is dilated in size with <50% respiratory variability, suggesting right atrial pressure of 15 mmHg. Comparison(s): No prior Echocardiogram. Compared to chart report- new pericardial effusion and pleural effusion. FINDINGS  Left Ventricle: Left ventricular ejection fraction, by estimation, is 65 to 70%. The left ventricle has normal function. The left ventricle has no regional wall motion abnormalities. The left ventricular internal cavity size was normal in size. There is  no left ventricular hypertrophy. Left ventricular diastolic parameters are consistent with Grade II diastolic dysfunction (pseudonormalization). Elevated left atrial pressure. Right Ventricle: The right ventricular size is normal. No increase in right ventricular wall thickness. Right ventricular  systolic function is normal. There is severely elevated pulmonary artery systolic pressure. The tricuspid regurgitant velocity is 3.76 m/s, and with an assumed right atrial pressure of 15 mmHg, the estimated right ventricular systolic pressure is 36.4 mmHg. Left Atrium: Left atrial size was moderately dilated. Right Atrium: Right atrial size was normal in size. Pericardium: A small pericardial effusion is present. There is no evidence of cardiac tamponade. Mitral Valve: The mitral valve is grossly normal. Moderate mitral valve regurgitation. MV peak gradient, 16.8 mmHg. The mean mitral valve gradient is 9.0 mmHg. Tricuspid Valve: The tricuspid valve is normal in structure. Tricuspid valve regurgitation is mild . No evidence of tricuspid stenosis. Aortic Valve: The aortic valve is tricuspid. Aortic valve regurgitation is mild to moderate. Aortic regurgitation PHT measures 214 msec. Mild to moderate aortic valve sclerosis/calcification is present, without any evidence of aortic stenosis. Aortic valve mean gradient measures 10.0 mmHg. Aortic valve peak gradient measures 18.3 mmHg. Aortic valve area, by VTI measures 1.60 cm. Pulmonic Valve: The pulmonic valve was not well visualized. Pulmonic valve regurgitation is not visualized. No evidence of pulmonic stenosis. Aorta: The aortic root is normal in size and structure. Venous: The inferior vena cava is dilated in size with less than 50% respiratory variability, suggesting right atrial pressure of 15 mmHg. IAS/Shunts: The atrial septum is grossly normal. Additional Comments: There is a moderate pleural effusion.  LEFT VENTRICLE PLAX 2D LVIDd:         3.90 cm  Diastology LVIDs:         2.20 cm  LV e' medial:    4.24 cm/s LV PW:         1.10 cm  LV E/e' medial:  42.0 LV IVS:        0.90 cm  LV e' lateral:   4.68 cm/s LVOT diam:  2.00 cm  LV E/e' lateral: 38.0 LV SV:         68 LV SV Index:   41 LVOT Area:     3.14 cm  RIGHT VENTRICLE RV S prime:     10.70 cm/s TAPSE  (M-mode): 1.8 cm LEFT ATRIUM             Index       RIGHT ATRIUM           Index LA diam:        3.70 cm 2.23 cm/m  RA Area:     15.90 cm LA Vol (A2C):   70.1 ml 42.27 ml/m RA Volume:   39.60 ml  23.88 ml/m LA Vol (A4C):   62.0 ml 37.39 ml/m LA Biplane Vol: 67.1 ml 40.46 ml/m  AORTIC VALVE AV Area (Vmax):    1.95 cm AV Area (Vmean):   1.75 cm AV Area (VTI):     1.60 cm AV Vmax:           214.00 cm/s AV Vmean:          147.000 cm/s AV VTI:            0.425 m AV Peak Grad:      18.3 mmHg AV Mean Grad:      10.0 mmHg LVOT Vmax:         133.00 cm/s LVOT Vmean:        82.000 cm/s LVOT VTI:          0.216 m LVOT/AV VTI ratio: 0.51 AI PHT:            214 msec  AORTA Ao Root diam: 2.70 cm MITRAL VALVE                TRICUSPID VALVE MV Area (PHT): 2.63 cm     TR Peak grad:   56.6 mmHg MV Area VTI:   1.18 cm     TR Vmax:        376.00 cm/s MV Peak grad:  16.8 mmHg MV Mean grad:  9.0 mmHg     SHUNTS MV Vmax:       2.05 m/s     Systemic VTI:  0.22 m MV Vmean:      143.0 cm/s   Systemic Diam: 2.00 cm MV Decel Time: 288 msec MR Peak grad: 132.2 mmHg MR Mean grad: 87.0 mmHg MR Vmax:      575.00 cm/s MR Vmean:     450.0 cm/s MV E velocity: 178.00 cm/s MV A velocity: 148.00 cm/s MV E/A ratio:  1.20 Rudean Haskell MD Electronically signed by Rudean Haskell MD Signature Date/Time: 04/24/2021/5:29:18 PM    Final      Medications   Scheduled Meds: . amLODipine  5 mg Oral Daily  . docusate sodium  100 mg Oral BID  . [START ON 04/26/2021] furosemide  40 mg Intravenous Daily  . heparin  5,000 Units Subcutaneous Q8H  . losartan  25 mg Oral Daily  . polyethylene glycol  17 g Oral Daily  . simvastatin  20 mg Oral Daily   Continuous Infusions:     LOS: 1 day    Time spent: 30 minutes with greater than 50% spent at bedside and in coordination of care.    Ezekiel Slocumb, DO [facility]  04/25/2021, 2:39 PM      If 7PM-7AM, please contact night-coverage. How to contact the [facility]  Attending or Consulting provider [place] or covering  provider during after hours [place], for this patient?    1. Check the care team in [facility] and look for a) attending/consulting [facility] provider listed and b) the [facility] team listed 2. Log into [email or web address] and use [place]'s universal password to access. If you do not have the password, please contact the hospital operator. 3. Locate the [facility] provider you are looking for under [facility] and page to a number that you can be directly reached. 4. If you still have difficulty reaching the provider, please page the [facility] (Director on Call) for the Hospitalists listed on amion for assistance.

## 2021-04-25 NOTE — Plan of Care (Signed)
  Problem: Education: Goal: Knowledge of General Education information will improve Description: Including pain rating scale, medication(s)/side effects and non-pharmacologic comfort measures 04/25/2021 1049 by Bethann Punches, RN Outcome: Progressing 04/25/2021 1048 by Bethann Punches, RN Outcome: Progressing   Problem: Health Behavior/Discharge Planning: Goal: Ability to manage health-related needs will improve 04/25/2021 1049 by Bethann Punches, RN Outcome: Progressing 04/25/2021 1048 by Bethann Punches, RN Outcome: Progressing   Problem: Clinical Measurements: Goal: Ability to maintain clinical measurements within normal limits will improve 04/25/2021 1049 by Bethann Punches, RN Outcome: Progressing 04/25/2021 1048 by Bethann Punches, RN Outcome: Progressing Goal: Will remain free from infection 04/25/2021 1049 by Bethann Punches, RN Outcome: Progressing 04/25/2021 1048 by Bethann Punches, RN Outcome: Progressing Goal: Diagnostic test results will improve 04/25/2021 1049 by Bethann Punches, RN Outcome: Progressing 04/25/2021 1048 by Bethann Punches, RN Outcome: Progressing Goal: Respiratory complications will improve 04/25/2021 1049 by Bethann Punches, RN Outcome: Progressing 04/25/2021 1048 by Bethann Punches, RN Outcome: Progressing Goal: Cardiovascular complication will be avoided 04/25/2021 1049 by Bethann Punches, RN Outcome: Progressing 04/25/2021 1048 by Bethann Punches, RN Outcome: Progressing   Problem: Activity: Goal: Risk for activity intolerance will decrease 04/25/2021 1049 by Bethann Punches, RN Outcome: Progressing 04/25/2021 1048 by Bethann Punches, RN Outcome: Progressing   Problem: Nutrition: Goal: Adequate nutrition will be maintained 04/25/2021 1049 by Bethann Punches, RN Outcome: Progressing 04/25/2021 1048 by Bethann Punches, RN Outcome: Progressing   Problem: Coping: Goal: Level of anxiety will decrease 04/25/2021 1049 by Bethann Punches, RN Outcome:  Progressing 04/25/2021 1048 by Bethann Punches, RN Outcome: Progressing   Problem: Elimination: Goal: Will not experience complications related to bowel motility 04/25/2021 1049 by Bethann Punches, RN Outcome: Progressing 04/25/2021 1048 by Bethann Punches, RN Outcome: Progressing Goal: Will not experience complications related to urinary retention 04/25/2021 1049 by Bethann Punches, RN Outcome: Progressing 04/25/2021 1048 by Bethann Punches, RN Outcome: Progressing   Problem: Pain Managment: Goal: General experience of comfort will improve 04/25/2021 1049 by Bethann Punches, RN Outcome: Progressing 04/25/2021 1048 by Bethann Punches, RN Outcome: Progressing   Problem: Safety: Goal: Ability to remain free from injury will improve 04/25/2021 1049 by Bethann Punches, RN Outcome: Progressing 04/25/2021 1048 by Bethann Punches, RN Outcome: Progressing   Problem: Skin Integrity: Goal: Risk for impaired skin integrity will decrease 04/25/2021 1049 by Bethann Punches, RN Outcome: Progressing 04/25/2021 1048 by Bethann Punches, RN Outcome: Progressing   Problem: Education: Goal: Required Educational Video(s) Outcome: Progressing   Problem: Clinical Measurements: Goal: Ability to maintain clinical measurements within normal limits will improve Outcome: Progressing Goal: Postoperative complications will be avoided or minimized Outcome: Progressing   Problem: Skin Integrity: Goal: Demonstration of wound healing without infection will improve Outcome: Progressing

## 2021-04-26 LAB — BASIC METABOLIC PANEL
Anion gap: 9 (ref 5–15)
BUN: 46 mg/dL — ABNORMAL HIGH (ref 8–23)
CO2: 28 mmol/L (ref 22–32)
Calcium: 8.8 mg/dL — ABNORMAL LOW (ref 8.9–10.3)
Chloride: 101 mmol/L (ref 98–111)
Creatinine, Ser: 1.79 mg/dL — ABNORMAL HIGH (ref 0.44–1.00)
GFR, Estimated: 27 mL/min — ABNORMAL LOW (ref >60)
Glucose, Bld: 101 mg/dL — ABNORMAL HIGH (ref 70–99)
Potassium: 3.5 mmol/L (ref 3.5–5.1)
Sodium: 138 mmol/L (ref 135–145)

## 2021-04-26 MED ORDER — ELIQUIS 5 MG PO TABS: ORAL_TABLET | ORAL | 1 refills | Status: DC

## 2021-04-26 MED ORDER — APIXABAN 5 MG PO TABS
10.0000 mg | ORAL_TABLET | Freq: Two times a day (BID) | ORAL | Status: DC
  Administered 2021-04-26: 10 mg via ORAL
  Filled 2021-04-26: qty 2

## 2021-04-26 MED ORDER — APIXABAN (ELIQUIS) EDUCATION KIT FOR DVT/PE PATIENTS
PACK | Freq: Once | Status: AC
  Filled 2021-04-26: qty 1

## 2021-04-26 MED ORDER — APIXABAN 5 MG PO TABS: 5.0000 mg | ORAL_TABLET | Freq: Two times a day (BID) | ORAL | Status: DC

## 2021-04-26 NOTE — Progress Notes (Signed)
-   O2 sat on RA at rest = 91% - O2 sat on RA while ambulating/during exertion = 87% - O2 sat on oxygen while ambulating/during exertion = 94%

## 2021-04-26 NOTE — Plan of Care (Signed)

## 2021-04-26 NOTE — Plan of Care (Signed)
  Problem: Education: Goal: Knowledge of General Education information will improve Description: Including pain rating scale, medication(s)/side effects and non-pharmacologic comfort measures Outcome: Adequate for Discharge   Problem: Health Behavior/Discharge Planning: Goal: Ability to manage health-related needs will improve Outcome: Adequate for Discharge   Problem: Clinical Measurements: Goal: Ability to maintain clinical measurements within normal limits will improve Outcome: Adequate for Discharge Goal: Will remain free from infection Outcome: Adequate for Discharge Goal: Diagnostic test results will improve Outcome: Adequate for Discharge Goal: Respiratory complications will improve Outcome: Adequate for Discharge Goal: Cardiovascular complication will be avoided Outcome: Adequate for Discharge   Problem: Activity: Goal: Risk for activity intolerance will decrease Outcome: Adequate for Discharge   Problem: Nutrition: Goal: Adequate nutrition will be maintained Outcome: Adequate for Discharge   Problem: Coping: Goal: Level of anxiety will decrease Outcome: Adequate for Discharge   Problem: Elimination: Goal: Will not experience complications related to bowel motility Outcome: Adequate for Discharge Goal: Will not experience complications related to urinary retention Outcome: Adequate for Discharge   Problem: Pain Managment: Goal: General experience of comfort will improve Outcome: Adequate for Discharge   Problem: Safety: Goal: Ability to remain free from injury will improve Outcome: Adequate for Discharge   Problem: Skin Integrity: Goal: Risk for impaired skin integrity will decrease Outcome: Adequate for Discharge   Problem: Education: Goal: Required Educational Video(s) Outcome: Adequate for Discharge   Problem: Clinical Measurements: Goal: Ability to maintain clinical measurements within normal limits will improve Outcome: Adequate for  Discharge Goal: Postoperative complications will be avoided or minimized Outcome: Adequate for Discharge   Problem: Skin Integrity: Goal: Demonstration of wound healing without infection will improve Outcome: Adequate for Discharge   

## 2021-04-26 NOTE — Discharge Summary (Signed)
PATIENT ID: Jillian Cooley        MRN:  [id number]          DOB/AGE: [birth year] / 87 y.o.    DISCHARGE SUMMARY  ADMISSION DATE:    04/23/2021 DISCHARGE DATE:   04/26/2021   ADMISSION DIAGNOSIS: S/P right rotator cuff repair [Z98.890] S/P arthroscopy of shoulder [Z98.890]    Acute hypoxia s/p above surgery believed recurrent pleural effusion  DISCHARGE DIAGNOSIS:  RIGHT SHOULDER ROTATOR CUFF TEAR    Bilat DVT/PE, recurrent pleural effusion from fluid overload   ADDITIONAL DIAGNOSIS: Active Problems:   S/P right rotator cuff repair   S/P arthroscopy of shoulder   CHF (congestive heart failure) (HCC)  Past Medical History:  Diagnosis Date  . Abnormal EKG   . Carpal tunnel syndrome   . CKD (chronic kidney disease), stage III ([place])   . DDD (degenerative disc disease), lumbar   . Hypertension   . Hypertension with renal disease   . Menopause   . Mixed hyperlipidemia   . Muscular degeneration   . Nonrheumatic aortic (valve) insufficiency   . Nonrheumatic mitral valve regurgitation   . Osteoarthritis   . Pedal edema   . Pleural effusion   . Skin cancer   . Vitamin D deficiency     PROCEDURE: Procedure(s): SHOULDER ARTHROSCOPY WITH ROTATOR CUFF REPAIR AND SUBACROMIAL DECOMPRESSION Right on 04/23/2021  CONSULTS: Medicine Treatment Team:  Lequita Halt, MD   HISTORY:  See H&P in chart  HOSPITAL COURSE:  Jillian Cooley is a 87 y.o. admitted on 04/23/2021 and found to have a diagnosis of [place].  After appropriate laboratory studies were obtained  they were taken to the operating room on 04/23/2021 and underwent  Procedure(s): SHOULDER ARTHROSCOPY WITH ROTATOR CUFF REPAIR AND SUBACROMIAL DECOMPRESSION  Right.   They were given perioperative antibiotics:  Anti-infectives (From admission, onward)   Start     Dose/Rate Route Frequency Ordered Stop   04/23/21 0830  ceFAZolin (ANCEF) IVPB 2g/100 mL premix        2 g 200 mL/hr over 30  Minutes Intravenous On call to O.R. 04/23/21 0825 04/23/21 1104    .  Tolerated the procedure well. Acute hypoxia following procedure believed to be recurrent pleural effusion admitted and medicine team consulted.  Chest xrays showing recurrent left pleural effusion pod2 underwent thoracentesis removed almost 1L still hypoxic, LE dopplers and CT angio confirm bilat dvt/PE.  Started on anticoag treatment    The patient was discharged on 3 Days Post-Op in  Stable condition.  Blood products given:none  DIAGNOSTIC STUDIES: Recent vital signs:  Patient Vitals for the past 24 hrs:  BP Temp Temp src Pulse Resp SpO2  04/26/21 0404 121/61 (!) 97.4 F (36.3 C) Oral 82 -- 99 %  04/25/21 2130 (!) 144/72 98.9 F (37.2 C) Oral (!) 101 16 97 %  04/25/21 1544 130/70 98.1 F (36.7 C) Oral 90 16 100 %       Recent laboratory studies: No results for input(s): WBC, HGB, HCT, PLT in the last 168 hours. Recent Labs    04/23/21 1859 04/24/21 0331 04/25/21 0124 04/26/21 0140  NA 136 137 137 138  K 4.0 4.1 3.6 3.5  CL 110 103 104 101  CO2 20* 24 25 28   BUN 48* 45* 47* 46*  CREATININE 1.84* 1.77* 1.85* 1.79*  GLUCOSE 205* 125* 106* 101*  CALCIUM 8.8* 9.2 9.0 8.8*   No results found for: INR, PROTIME  Recent Radiographic Studies :  DG Chest 1 View  Result Date: 04/25/2021 CLINICAL DATA:  Post left-sided thoracentesis. EXAM: CHEST  1 VIEW COMPARISON:  Radiographs 04/24/2021 and 04/23/2021. FINDINGS: 1525 hours. The left pleural effusion has significantly decreased in volume. There are small bilateral pleural effusions and mild bibasilar atelectasis. No pneumothorax. The heart size and mediastinal contours are stable with mild cardiomegaly and aortic atherosclerosis. Curvilinear tubing overlies the lower right chest. There are postsurgical changes at the distal right clavicle. IMPRESSION: Interval decreased left pleural effusion following thoracentesis. No pneumothorax. Electronically Signed   By:  Richardean Sale M.D.   On: 04/25/2021 16:00   DG Chest 1 View  Result Date: 04/24/2021 CLINICAL DATA:  Shortness of breath. EXAM: CHEST  1 VIEW COMPARISON:  04/23/2021. FINDINGS: Heart size normal. Normal pulmonary vascularity. Left mid and lower lung infiltrates/edema with associated mild to moderate left pleural effusion again noted. Mild right basilar atelectasis. No pneumothorax. No acute bony abnormality. Prior cervical spine fusion. IMPRESSION: Left mid and lower lung infiltrates/edema associated mild to moderate left pleural effusion again noted. Mild right basilar atelectasis. Electronically Signed   By: Marcello Moores  Register   On: 04/24/2021 05:52   NM Pulmonary Perfusion  Result Date: 04/25/2021 CLINICAL DATA:  Difficulty breathing EXAM: NUCLEAR MEDICINE PERFUSION LUNG SCAN TECHNIQUE: Perfusion images were obtained in multiple projections after intravenous injection of radiopharmaceutical. Ventilation scans intentionally deferred if perfusion scan and chest x-ray adequate for interpretation during COVID 19 epidemic. RADIOPHARMACEUTICALS:  4.18 mCi Tc-46m MAA IV COMPARISON:  Chest x-ray from earlier in the same day. FINDINGS: Uptake is noted throughout both lungs. Apical wedge-shaped defects are noted bilaterally consistent with pulmonary emboli. Mild blunting of the lung bases is noted due to pleural effusions. IMPRESSION: Changes consistent with bilateral pulmonary emboli. Electronically Signed   By: Inez Catalina M.D.   On: 04/25/2021 22:23   DG Chest Port 1 View  Result Date: 04/25/2021 CLINICAL DATA:  Pleural effusion, cough EXAM: PORTABLE CHEST 1 VIEW COMPARISON:  01/07/2021 FINDINGS: Small left pleural effusion with associated atelectasis unchanged from 01/07/2021. Trace right pleural effusion and right basilar airspace disease. No pneumothorax. Stable cardiomediastinal silhouette. No aggressive osseous lesion. IMPRESSION: Small left pleural effusion with associated atelectasis unchanged from  01/07/2021. Trace right pleural effusion and right basilar airspace disease. Electronically Signed   By: Kathreen Devoid   On: 04/25/2021 11:01   ECHOCARDIOGRAM COMPLETE  Result Date: 04/24/2021    ECHOCARDIOGRAM REPORT   Patient Name:   Jillian Cooley Date of Exam: 04/24/2021 Medical Rec #:  [id number]             Height:       63.0 in Accession #:    [id number]            Weight:       139.3 lb Date of Birth:  25-Mar-1932             BSA:          1.658 m Patient Age:    88 years              BP:           132/62 mmHg Patient Gender: F                     HR:           93 bpm. Exam Location:  Inpatient Procedure: 2D Echo Indications:    Mitral Valve Disorder; Hypoxia post  shoulder surgery ? Volume                 overload. Hx of valvular heart disease  History:        Patient has prior history of Echocardiogram examinations, most                 recent 01/23/2021. Risk Factors:Hypertension.  Sonographer:    Mikki Santee RDCS (AE) Referring Phys: [id number] [place]  1. Right ventricular systolic function is normal. The right ventricular size is normal. There is severely elevated pulmonary artery systolic pressure. The estimated right ventricular systolic pressure is 10.2 mmHg.  2. Left ventricular ejection fraction, by estimation, is 65 to 70%. The left ventricle has normal function. The left ventricle has no regional wall motion abnormalities. Left ventricular diastolic parameters are consistent with Grade II diastolic dysfunction (pseudonormalization). Elevated left atrial pressure.  3. Left atrial size was moderately dilated.  4. A small pericardial effusion is present. There is no evidence of cardiac tamponade. Moderate pleural effusion.  5. The mitral valve is grossly normal. Moderate mitral valve regurgitation; may be underestimated in this study.  6. The aortic valve is tricuspid. Aortic valve regurgitation is mild to moderate and eccentric. Mild to moderate aortic valve  sclerosis/calcification is present, without any evidence of aortic stenosis.  7. The inferior vena cava is dilated in size with <50% respiratory variability, suggesting right atrial pressure of 15 mmHg. Comparison(s): No prior Echocardiogram. Compared to chart report- new pericardial effusion and pleural effusion. FINDINGS  Left Ventricle: Left ventricular ejection fraction, by estimation, is 65 to 70%. The left ventricle has normal function. The left ventricle has no regional wall motion abnormalities. The left ventricular internal cavity size was normal in size. There is  no left ventricular hypertrophy. Left ventricular diastolic parameters are consistent with Grade II diastolic dysfunction (pseudonormalization). Elevated left atrial pressure. Right Ventricle: The right ventricular size is normal. No increase in right ventricular wall thickness. Right ventricular systolic function is normal. There is severely elevated pulmonary artery systolic pressure. The tricuspid regurgitant velocity is 3.76 m/s, and with an assumed right atrial pressure of 15 mmHg, the estimated right ventricular systolic pressure is 72.5 mmHg. Left Atrium: Left atrial size was moderately dilated. Right Atrium: Right atrial size was normal in size. Pericardium: A small pericardial effusion is present. There is no evidence of cardiac tamponade. Mitral Valve: The mitral valve is grossly normal. Moderate mitral valve regurgitation. MV peak gradient, 16.8 mmHg. The mean mitral valve gradient is 9.0 mmHg. Tricuspid Valve: The tricuspid valve is normal in structure. Tricuspid valve regurgitation is mild . No evidence of tricuspid stenosis. Aortic Valve: The aortic valve is tricuspid. Aortic valve regurgitation is mild to moderate. Aortic regurgitation PHT measures 214 msec. Mild to moderate aortic valve sclerosis/calcification is present, without any evidence of aortic stenosis. Aortic valve mean gradient measures 10.0 mmHg. Aortic valve peak  gradient measures 18.3 mmHg. Aortic valve area, by VTI measures 1.60 cm. Pulmonic Valve: The pulmonic valve was not well visualized. Pulmonic valve regurgitation is not visualized. No evidence of pulmonic stenosis. Aorta: The aortic root is normal in size and structure. Venous: The inferior vena cava is dilated in size with less than 50% respiratory variability, suggesting right atrial pressure of 15 mmHg. IAS/Shunts: The atrial septum is grossly normal. Additional Comments: There is a moderate pleural effusion.  LEFT VENTRICLE PLAX 2D LVIDd:         3.90 cm  Diastology LVIDs:  2.20 cm  LV e' medial:    4.24 cm/s LV PW:         1.10 cm  LV E/e' medial:  42.0 LV IVS:        0.90 cm  LV e' lateral:   4.68 cm/s LVOT diam:     2.00 cm  LV E/e' lateral: 38.0 LV SV:         68 LV SV Index:   41 LVOT Area:     3.14 cm  RIGHT VENTRICLE RV S prime:     10.70 cm/s TAPSE (M-mode): 1.8 cm LEFT ATRIUM             Index       RIGHT ATRIUM           Index LA diam:        3.70 cm 2.23 cm/m  RA Area:     15.90 cm LA Vol (A2C):   70.1 ml 42.27 ml/m RA Volume:   39.60 ml  23.88 ml/m LA Vol (A4C):   62.0 ml 37.39 ml/m LA Biplane Vol: 67.1 ml 40.46 ml/m  AORTIC VALVE AV Area (Vmax):    1.95 cm AV Area (Vmean):   1.75 cm AV Area (VTI):     1.60 cm AV Vmax:           214.00 cm/s AV Vmean:          147.000 cm/s AV VTI:            0.425 m AV Peak Grad:      18.3 mmHg AV Mean Grad:      10.0 mmHg LVOT Vmax:         133.00 cm/s LVOT Vmean:        82.000 cm/s LVOT VTI:          0.216 m LVOT/AV VTI ratio: 0.51 AI PHT:            214 msec  AORTA Ao Root diam: 2.70 cm MITRAL VALVE                TRICUSPID VALVE MV Area (PHT): 2.63 cm     TR Peak grad:   56.6 mmHg MV Area VTI:   1.18 cm     TR Vmax:        376.00 cm/s MV Peak grad:  16.8 mmHg MV Mean grad:  9.0 mmHg     SHUNTS MV Vmax:       2.05 m/s     Systemic VTI:  0.22 m MV Vmean:      143.0 cm/s   Systemic Diam: 2.00 cm MV Decel Time: 288 msec MR Peak grad: 132.2 mmHg MR  Mean grad: 87.0 mmHg MR Vmax:      575.00 cm/s MR Vmean:     450.0 cm/s MV E velocity: 178.00 cm/s MV A velocity: 148.00 cm/s MV E/A ratio:  1.20 Rudean Haskell MD Electronically signed by Rudean Haskell MD Signature Date/Time: 04/24/2021/5:29:18 PM    Final    VAS [place] LOWER EXTREMITY VENOUS (DVT)  Result Date: 04/25/2021  Lower Venous DVT Study Patient Name:  Jillian Cooley  Date of Exam:   04/25/2021 Medical Rec #: [id number]              Accession #:    [id number] Date of Birth: [birth year]              Patient Gender: F Patient Age:   88Y Exam Location:  [facility] Procedure:      VAS [place] LOWER EXTREMITY VENOUS (DVT) Referring Phys: [id number] KELLY A GRIFFITH --------------------------------------------------------------------------------  Indications: Recent surgery.  Performing Technologist: Antonieta Pert RDMS, RVT  Examination Guidelines: A complete evaluation includes B-mode imaging, spectral Doppler, color Doppler, and power Doppler as needed of all accessible portions of each vessel. Bilateral testing is considered an integral part of a complete examination. Limited examinations for reoccurring indications may be performed as noted. The reflux portion of the exam is performed with the patient in reverse Trendelenburg.  +---------+---------------+---------+-----------+----------+--------------+ RIGHT    CompressibilityPhasicitySpontaneityPropertiesThrombus Aging +---------+---------------+---------+-----------+----------+--------------+ CFV      Full           Yes      Yes                                 +---------+---------------+---------+-----------+----------+--------------+ SFJ      Full                                                        +---------+---------------+---------+-----------+----------+--------------+ FV Prox  Full                                                         +---------+---------------+---------+-----------+----------+--------------+ FV Mid   Full                                                        +---------+---------------+---------+-----------+----------+--------------+ FV DistalFull                                                        +---------+---------------+---------+-----------+----------+--------------+ PFV      Full                                                        +---------+---------------+---------+-----------+----------+--------------+ POP      Full           Yes      Yes                                 +---------+---------------+---------+-----------+----------+--------------+ PTV      Full                                                        +---------+---------------+---------+-----------+----------+--------------+ PERO     Full                                                        +---------+---------------+---------+-----------+----------+--------------+  Soleal   Partial                            dilated   Acute          +---------+---------------+---------+-----------+----------+--------------+ GSV      Full                                                        +---------+---------------+---------+-----------+----------+--------------+   +---------+---------------+---------+-----------+----------+--------------+ LEFT     CompressibilityPhasicitySpontaneityPropertiesThrombus Aging +---------+---------------+---------+-----------+----------+--------------+ CFV      Full           Yes      Yes                                 +---------+---------------+---------+-----------+----------+--------------+ SFJ      Full                                                        +---------+---------------+---------+-----------+----------+--------------+ FV Prox  Full                                                         +---------+---------------+---------+-----------+----------+--------------+ FV Mid   Full                                                        +---------+---------------+---------+-----------+----------+--------------+ FV DistalFull                                                        +---------+---------------+---------+-----------+----------+--------------+ PFV      Full                                                        +---------+---------------+---------+-----------+----------+--------------+ POP      Full           Yes      Yes                                 +---------+---------------+---------+-----------+----------+--------------+ PTV      Partial                            dilated   Acute          +---------+---------------+---------+-----------+----------+--------------+ PERO     Full                                                        +---------+---------------+---------+-----------+----------+--------------+  GSV      Full                                                        +---------+---------------+---------+-----------+----------+--------------+     Summary: RIGHT: - Findings consistent with acute deep vein thrombosis involving the right soleal veins. - No cystic structure found in the popliteal fossa. - All other veins visualized appear fully compressible and demonstrate appropriate Doppler characteristics.  LEFT: - Findings consistent with acute deep vein thrombosis involving the left posterior tibial veins. - No cystic structure found in the popliteal fossa. - All other veins visualized appear fully compressible and demonstrate appropriate Doppler characteristics.  *See table(s) above for measurements and observations. Electronically signed by Ruta Hinds MD on 04/25/2021 at 5:29:48 PM.    Final    IR THORACENTESIS ASP PLEURAL SPACE W/IMG GUIDE  Result Date: 04/25/2021 INDICATION: Acute respiratory failure with hypoxia. left pleural  effusion. Request for therapeutic thoracentesis. EXAM: ULTRASOUND GUIDED LEFT THORACENTESIS MEDICATIONS: 10 mL 1% lidocaine COMPLICATIONS: None immediate. PROCEDURE: An ultrasound guided thoracentesis was thoroughly discussed with the patient and questions answered. The benefits, risks, alternatives and complications were also discussed. The patient understands and wishes to proceed with the procedure. Written consent was obtained. Ultrasound was performed to localize and mark an adequate pocket of fluid in the left chest. The area was then prepped and draped in the normal sterile fashion. 1% Lidocaine was used for local anesthesia. Under ultrasound guidance a 6 Fr Safe-T-Centesis catheter was introduced. Thoracentesis was performed. The catheter was removed and a dressing applied. FINDINGS: A total of approximately 800 cc of clear amber fluid was removed. Post procedure CXR ordered. IMPRESSION: Successful ultrasound guided left thoracentesis yielding 800 cc of pleural fluid. Read by: Durenda Guthrie, PA-C Electronically Signed   By: Ruthann Cancer MD   On: 04/25/2021 15:23    DISCHARGE INSTRUCTIONS:   DISCHARGE MEDICATIONS:     FOLLOW UP VISIT:    Follow-up Information    Earlie Server, MD. Schedule an appointment as soon as possible for a visit in 1 week.   Specialty: Orthopedic Surgery Contact information: [place] [address] [phone]        [name], MD. Schedule an appointment as soon as possible for a visit in 1 week(s).   Specialty: Internal Medicine Why: hospital f/u recurrent left pleural effusion and bilat dvt/PE now on eliquis Contact information: [address] [place] [address] [phone]               DISPOSITION:   Home  CONDITION:  Stable   Chriss Czar, PA-C  04/26/2021 12:04 PM

## 2021-04-26 NOTE — Progress Notes (Signed)
PROGRESS NOTE    Jillian Cooley   HQR:[id number]  DOB: [birth year]  PCP: [name], MD    DOA: 04/23/2021 LOS: 2   Brief Narrative   87 year old female patient with past medical history of moderate aortic/mitral/tricuspid regurgitation, HTN, CKD stage III, right shoulder pain awaiting surgery x 3 months with pre-op evaluation revealing for valvular heart disease and a pleural effusion.  She presented on 04/23/21 for elective right shoulder surgery, and post-operatively developed acute respiratory failure with hypoxia requiring 4-6 L/min supplemental oxygen.    Workup for etiology of hypoxia appears to be volume overload with CHF/valvular heart disease, fluids given in surgery, nerve block for upper extremity ?may affect inspirations as well.  No evidence for infection including fever/chills, cough, leukocytosis.  Chest x-ray showed pulmonary congestion and worsening of left-sided pleural effusion.  She is undergoing diuresis.  Thoracentesis tentatively planned if IR feels adequate fluid to do safely.     Assessment & Plan   Active Problems:   S/P right rotator cuff repair   S/P arthroscopy of shoulder   CHF (congestive heart failure) (HCC)   Acute respiratory failure with hypoxia due to  Acute Pulmonary Emboli /  Left Lower Extremity DVT Initially felt this was due to volume overload, but hypoxia was persistent despite adequate diuresis and resolution of edema. Found D-dimer elevated.   Lower extremity duplex US showed small LLE DVT  VQ showed small bilateral PE's (no CTA due to renal dz) PLAN -  --Start Eliquis 10 mg PO BID,10 days, then 5 mg BID --Qualifies for home oxygen - ordered (2 L/min on exertion and at night) --O2 as needed to keep sats 90-95%, wean as tolerated   Valvular heart disease and Severe Pulmonary HTN and Acute on Chronic HFpEF with  Recurrent Left Pleural Effusion -  CHF, valve dz and pleural effusion just recently diagnosed in pre-op  assessment for shoulder surgery which had originally been planned for February, was delayed until now. Echo obtained shows normal EF 65-70%, grade II diastolic dysfunction, severely elevated PASP, small pericardial effusion without signs of tamponade, moderate pleural effusion, mod-severe MR, mild-mod AR. Thoracentesis 5/6 with 800 cc removed. --daily weights --stop IV Lasix   CKD Stage IIIb - unclear baseline renal function, expect near baseline.  Monitor renal function closely with diuresis.  Hypertension - takes amlodipine and losartan, continued. Monitor BP with diuresis and scale these back if needed.  S/p Right Rotator Cuff Repair Surgery - on 5/4.  Management per primary, Orthopedic service.   Patient BMI: Body mass index is 23.86 kg/m.   DVT prophylaxis:    Diet:  Diet Orders (From admission, onward)    Start     Ordered   04/23/21 1532  Diet regular Room service appropriate? Yes; Fluid consistency: Thin  Diet effective now       Question Answer Comment  Room service appropriate? Yes   Fluid consistency: Thin      04/23/21 1531            Code Status: Full Code    Subjective 04/26/21    Pt seen with husband and daughter, pt up in recliner.  Reports feeling okay.  No significant shoulder pain so far. Denies SOB at rest or when getting up from bed to chair today.  Currently on room air with sat 90-94% during encounter.  RN to ambulate and check sats for home O2 qualification.  She will need with exertion and probably at night.  Discussed this and  starting Eliquis for small DVT and PE.     Disposition Plan & Communication   Status is: Inpatient  Inpatient appropriate due to new hypoxia under evaluation.Stable for d/c today with home oxygen.   Dispo: The patient is from: home              Anticipated d/c is to: home              Patient currently IS medically stable for d/c.   Difficult to place patient No  Family Communication: husband and daughter at  bedside on rounds   Consults, Procedures, Significant Events   Consultants:   TRH / Hospitalists  Ortho is primary  Procedures:   5/4 - Rotator Cuff repair surgery  Antimicrobials:  Anti-infectives (From admission, onward)   Start     Dose/Rate Route Frequency Ordered Stop   04/23/21 0830  ceFAZolin (ANCEF) IVPB 2g/100 mL premix        2 g 200 mL/hr over 30 Minutes Intravenous On call to O.R. 04/23/21 0825 04/23/21 1104        Micro    Objective   Vitals:   04/25/21 1544 04/25/21 2130 04/26/21 0404 04/26/21 1435  BP: 130/70 (!) 144/72 121/61 (!) 120/54  Pulse: 90 (!) 101 82 91  Resp: 16 16  18   Temp: 98.1 F (36.7 C) 98.9 F (37.2 C) (!) 97.4 F (36.3 C) 98.2 F (36.8 C)  TempSrc: Oral Oral Oral Oral  SpO2: 100% 97% 99% 98%  Weight:      Height:        Intake/Output Summary (Last 24 hours) at 04/26/2021 1814 Last data filed at 04/26/2021 0500 Gross per 24 hour  Intake 120 ml  Output --  Net 120 ml   Filed Weights   04/17/21 1232 04/23/21 0845 04/25/21 1133  Weight: 63 kg 63.2 kg 61.1 kg    Physical Exam:  General exam: awake, alert, no acute distress Respiratory system: CTAB, bases diminished L>R, no wheezes or rhonchi, normal respiratory effort, on room air at rest with sats 90-94%. Cardiovascular system: RRR, no extremity edema  Central nervous system: A&O x4. no gross focal neurologic deficits, normal speech  Labs   Data Reviewed: I have personally reviewed following labs and imaging studies  CBC: No results for input(s): WBC, NEUTROABS, HGB, HCT, MCV, PLT in the last 168 hours. Basic Metabolic Panel: Recent Labs  Lab 04/23/21 1859 04/24/21 0331 04/25/21 0124 04/26/21 0140  NA 136 137 137 138  K 4.0 4.1 3.6 3.5  CL 110 103 104 101  CO2 20* 24 25 28   GLUCOSE 205* 125* 106* 101*  BUN 48* 45* 47* 46*  CREATININE 1.84* 1.77* 1.85* 1.79*  CALCIUM 8.8* 9.2 9.0 8.8*   GFR: Estimated Creatinine Clearance: 18.7 mL/min (A) (by C-G formula  based on SCr of 1.79 mg/dL (H)). Liver Function Tests: No results for input(s): AST, ALT, ALKPHOS, BILITOT, PROT, ALBUMIN in the last 168 hours. No results for input(s): LIPASE, AMYLASE in the last 168 hours. No results for input(s): AMMONIA in the last 168 hours. Coagulation Profile: No results for input(s): INR, PROTIME in the last 168 hours. Cardiac Enzymes: No results for input(s): CKTOTAL, CKMB, CKMBINDEX, TROPONINI in the last 168 hours. BNP (last 3 results) Recent Labs    02/28/21 1109  PROBNP 1,545*   HbA1C: No results for input(s): HGBA1C in the last 72 hours. CBG: No results for input(s): GLUCAP in the last 168 hours. Lipid Profile: No results for input(s):  CHOL, HDL, LDLCALC, TRIG, CHOLHDL, LDLDIRECT in the last 72 hours. Thyroid Function Tests: No results for input(s): TSH, T4TOTAL, FREET4, T3FREE, THYROIDAB in the last 72 hours. Anemia Panel: No results for input(s): VITAMINB12, FOLATE, FERRITIN, TIBC, IRON, RETICCTPCT in the last 72 hours. Sepsis Labs: Recent Labs  Lab 04/24/21 0920  PROCALCITON <0.10    Recent Results (from the past 240 hour(s))  SARS CORONAVIRUS 2 (TAT 6-24 HRS) Nasopharyngeal Nasopharyngeal Swab     Status: None   Collection Time: 04/21/21  8:36 AM   Specimen: Nasopharyngeal Swab  Result Value Ref Range Status   SARS Coronavirus 2 NEGATIVE NEGATIVE Final    Comment: (NOTE) SARS-CoV-2 target nucleic acids are NOT DETECTED.  The SARS-CoV-2 RNA is generally detectable in upper and lower respiratory specimens during the acute phase of infection. Negative results do not preclude SARS-CoV-2 infection, do not rule out co-infections with other pathogens, and should not be used as the sole basis for treatment or other patient management decisions. Negative results must be combined with clinical observations, patient history, and epidemiological information. The expected result is Negative.  Fact Sheet for  Patients: SugarRoll.be  Fact Sheet for Healthcare Providers: https://www.woods-mathews.com/  This test is not yet approved or cleared by the [place] FDA and  has been authorized for detection and/or diagnosis of SARS-CoV-2 by FDA under an Emergency Use Authorization (EUA). This EUA will remain  in effect (meaning this test can be used) for the duration of the COVID-19 declaration under Se ction 564(b)(1) of the Act, 21 U.S.C. section 360bbb-3(b)(1), unless the authorization is terminated or revoked sooner.  Performed at [facility], [place] [address], [place], [place] [address]       Imaging Studies   DG Chest 1 View  Result Date: 04/25/2021 CLINICAL DATA:  Post left-sided thoracentesis. EXAM: CHEST  1 VIEW COMPARISON:  Radiographs 04/24/2021 and 04/23/2021. FINDINGS: 1525 hours. The left pleural effusion has significantly decreased in volume. There are small bilateral pleural effusions and mild bibasilar atelectasis. No pneumothorax. The heart size and mediastinal contours are stable with mild cardiomegaly and aortic atherosclerosis. Curvilinear tubing overlies the lower right chest. There are postsurgical changes at the distal right clavicle. IMPRESSION: Interval decreased left pleural effusion following thoracentesis. No pneumothorax. Electronically Signed   By: Richardean Sale M.D.   On: 04/25/2021 16:00   NM Pulmonary Perfusion  Result Date: 04/25/2021 CLINICAL DATA:  Difficulty breathing EXAM: NUCLEAR MEDICINE PERFUSION LUNG SCAN TECHNIQUE: Perfusion images were obtained in multiple projections after intravenous injection of radiopharmaceutical. Ventilation scans intentionally deferred if perfusion scan and chest x-ray adequate for interpretation during COVID 19 epidemic. RADIOPHARMACEUTICALS:  4.18 mCi Tc-57m MAA IV COMPARISON:  Chest x-ray from earlier in the same day. FINDINGS: Uptake is noted throughout both lungs. Apical  wedge-shaped defects are noted bilaterally consistent with pulmonary emboli. Mild blunting of the lung bases is noted due to pleural effusions. IMPRESSION: Changes consistent with bilateral pulmonary emboli. Electronically Signed   By: Inez Catalina M.D.   On: 04/25/2021 22:23   VAS [place] LOWER EXTREMITY VENOUS (DVT)  Result Date: 04/25/2021  Lower Venous DVT Study Patient Name:  Jillian Cooley  Date of Exam:   04/25/2021 Medical Rec #: [id number]              Accession #:    [id number] Date of Birth: January 07, 1932              Patient Gender: F Patient Age:   55Y Exam Location:  [facility] Procedure:      VAS [place] LOWER EXTREMITY VENOUS (DVT) Referring Phys: [id number] Deja Kaigler A Merlene Dante --------------------------------------------------------------------------------  Indications: Recent surgery.  Performing Technologist: Antonieta Pert RDMS, RVT  Examination Guidelines: A complete evaluation includes B-mode imaging, spectral Doppler, color Doppler, and power Doppler as needed of all accessible portions of each vessel. Bilateral testing is considered an integral part of a complete examination. Limited examinations for reoccurring indications may be performed as noted. The reflux portion of the exam is performed with the patient in reverse Trendelenburg.  +---------+---------------+---------+-----------+----------+--------------+ RIGHT    CompressibilityPhasicitySpontaneityPropertiesThrombus Aging +---------+---------------+---------+-----------+----------+--------------+ CFV      Full           Yes      Yes                                 +---------+---------------+---------+-----------+----------+--------------+ SFJ      Full                                                        +---------+---------------+---------+-----------+----------+--------------+ FV Prox  Full                                                         +---------+---------------+---------+-----------+----------+--------------+ FV Mid   Full                                                        +---------+---------------+---------+-----------+----------+--------------+ FV DistalFull                                                        +---------+---------------+---------+-----------+----------+--------------+ PFV      Full                                                        +---------+---------------+---------+-----------+----------+--------------+ POP      Full           Yes      Yes                                 +---------+---------------+---------+-----------+----------+--------------+ PTV      Full                                                        +---------+---------------+---------+-----------+----------+--------------+ PERO     Full                                                        +---------+---------------+---------+-----------+----------+--------------+  Soleal   Partial                            dilated   Acute          +---------+---------------+---------+-----------+----------+--------------+ GSV      Full                                                        +---------+---------------+---------+-----------+----------+--------------+   +---------+---------------+---------+-----------+----------+--------------+ LEFT     CompressibilityPhasicitySpontaneityPropertiesThrombus Aging +---------+---------------+---------+-----------+----------+--------------+ CFV      Full           Yes      Yes                                 +---------+---------------+---------+-----------+----------+--------------+ SFJ      Full                                                        +---------+---------------+---------+-----------+----------+--------------+ FV Prox  Full                                                         +---------+---------------+---------+-----------+----------+--------------+ FV Mid   Full                                                        +---------+---------------+---------+-----------+----------+--------------+ FV DistalFull                                                        +---------+---------------+---------+-----------+----------+--------------+ PFV      Full                                                        +---------+---------------+---------+-----------+----------+--------------+ POP      Full           Yes      Yes                                 +---------+---------------+---------+-----------+----------+--------------+ PTV      Partial                            dilated   Acute          +---------+---------------+---------+-----------+----------+--------------+ PERO     Full                                                        +---------+---------------+---------+-----------+----------+--------------+  GSV      Full                                                        +---------+---------------+---------+-----------+----------+--------------+     Summary: RIGHT: - Findings consistent with acute deep vein thrombosis involving the right soleal veins. - No cystic structure found in the popliteal fossa. - All other veins visualized appear fully compressible and demonstrate appropriate Doppler characteristics.  LEFT: - Findings consistent with acute deep vein thrombosis involving the left posterior tibial veins. - No cystic structure found in the popliteal fossa. - All other veins visualized appear fully compressible and demonstrate appropriate Doppler characteristics.  *See table(s) above for measurements and observations. Electronically signed by Ruta Hinds MD on 04/25/2021 at 5:29:48 PM.    Final    IR THORACENTESIS ASP PLEURAL SPACE W/IMG GUIDE  Result Date: 04/25/2021 INDICATION: Acute respiratory failure with hypoxia. left pleural  effusion. Request for therapeutic thoracentesis. EXAM: ULTRASOUND GUIDED LEFT THORACENTESIS MEDICATIONS: 10 mL 1% lidocaine COMPLICATIONS: None immediate. PROCEDURE: An ultrasound guided thoracentesis was thoroughly discussed with the patient and questions answered. The benefits, risks, alternatives and complications were also discussed. The patient understands and wishes to proceed with the procedure. Written consent was obtained. Ultrasound was performed to localize and mark an adequate pocket of fluid in the left chest. The area was then prepped and draped in the normal sterile fashion. 1% Lidocaine was used for local anesthesia. Under ultrasound guidance a 6 Fr Safe-T-Centesis catheter was introduced. Thoracentesis was performed. The catheter was removed and a dressing applied. FINDINGS: A total of approximately 800 cc of clear amber fluid was removed. Post procedure CXR ordered. IMPRESSION: Successful ultrasound guided left thoracentesis yielding 800 cc of pleural fluid. Read by: Durenda Guthrie, PA-C Electronically Signed   By: Ruthann Cancer MD   On: 04/25/2021 15:23     Medications   Scheduled Meds: . amLODipine  5 mg Oral Daily  . apixaban  10 mg Oral BID   Followed by  . [START ON 05/03/2021] apixaban  5 mg Oral BID  . docusate sodium  100 mg Oral BID  . losartan  25 mg Oral Daily  . polyethylene glycol  17 g Oral Daily  . simvastatin  20 mg Oral Daily   Continuous Infusions:     LOS: 2 days    Time spent: 30 minutes with greater than 50% spent at bedside and in coordination of care.    Ezekiel Slocumb, DO [facility]  04/26/2021, 6:14 PM      If 7PM-7AM, please contact night-coverage. How to contact the [facility] Attending or Consulting provider [place] or covering provider during after hours [place], for this patient?    1. Check the care team in [facility] and look for a) attending/consulting [facility] provider listed and b) the [facility] team listed 2. Log into [email or web address] and use [facility]'s universal password to access. If you do not have the password, please contact the hospital operator. 3. Locate the [facility] provider you are looking for under [facility] and page to a number that you can be directly reached. 4. If you still have difficulty reaching the provider, please page the [facility] (Director on Call) for the Hospitalists listed on amion for assistance.

## 2021-04-26 NOTE — Discharge Instructions (Signed)
Information on my medicine - ELIQUIS (apixaban)  This medication education was reviewed with me or my healthcare representative as part of my discharge preparation.   Why was Eliquis prescribed for you? Eliquis was prescribed for you to reduce the risk of forming blood clots that can cause a stroke if you have a medical condition called atrial fibrillation (a type of irregular heartbeat) OR to reduce the risk of a blood clots forming after orthopedic surgery.  What do You need to know about Eliquis ? Take your Eliquis TWICE DAILY - one tablet in the morning and one tablet in the evening with or without food.  It would be best to take the doses about the same time each day.  If you have difficulty swallowing the tablet whole please discuss with your pharmacist how to take the medication safely.  Take Eliquis exactly as prescribed by your doctor and DO NOT stop taking Eliquis without talking to the doctor who prescribed the medication.  Stopping may increase your risk of developing a new clot or stroke.  Refill your prescription before you run out.  After discharge, you should have regular check-up appointments with your healthcare provider that is prescribing your Eliquis.  In the future your dose may need to be changed if your kidney function or weight changes by a significant amount or as you get older.  What do you do if you miss a dose? If you miss a dose, take it as soon as you remember on the same day and resume taking twice daily.  Do not take more than one dose of ELIQUIS at the same time.  Important Safety Information A possible side effect of Eliquis is bleeding. You should call your healthcare provider right away if you experience any of the following: ? Bleeding from an injury or your nose that does not stop. ? Unusual colored urine (red or dark brown) or unusual colored stools (red or black). ? Unusual bruising for unknown reasons. ? A serious fall or if you hit your head  (even if there is no bleeding).  Some medicines may interact with Eliquis and might increase your risk of bleeding or clotting while on Eliquis. To help avoid this, consult your healthcare provider or pharmacist prior to using any new prescription or non-prescription medications, including herbals, vitamins, non-steroidal anti-inflammatory drugs (NSAIDs) and supplements.  This website has more information on Eliquis (apixaban): [email or web address].   Diet: As you were doing prior to hospitalization   Activity: Increase activity slowly as tolerated  No lifting or driving for 6 weeks   Shower: May shower without a dressing on post op day #3, NO SOAKING in tub   Dressing: You may change your dressing on post op day #3.  Then change the dressing daily with sterile 4"x4"s gauze dressing    Weight Bearing: nonweight bearing operative arm  To prevent constipation: you may use a stool softener such as -  Colace ( over the counter) 100 mg by mouth twice a day  Drink plenty of fluids ( prune juice may be helpful) and high fiber foods  Miralax ( over the counter) for constipation as needed.   Precautions: If you experience chest pain or shortness of breath - call 911 immediately For transfer to the hospital emergency department!!  If you develop a fever greater that 101 F, purulent drainage from wound, increased redness or drainage from wound, or calf pain -- Call the office   Follow- Up Appointment: Please call for an appointment  to be seen in 1 week or as previously scheduled  [place] - [phone]

## 2021-04-26 NOTE — TOC Transition Note (Addendum)
Transition of Care [facility]) - CM/SW Discharge Note   Patient Details  Name: Jillian Cooley MRN: [id number] Date of Birth: September 19, 1932  [facility]) CM/SW Contact:  Maebelle Munroe, RN Phone Number: 04/26/2021, 5:12 PM   Clinical Narrative: [facility] team for discharge planning. Spoke to daughter who is at pt's bedside regarding discharge plan for home O2. She is in agreement with plan for home O2. Shared O2 will be delivered to the bedside and home delivery. She expressed understanding. Call to [employer] to order O2. Spoke to [phone]. Daughter denies any other needs at present.      Final next level of care: Home/Self Care Barriers to Discharge: No Barriers Identified   Patient Goals and CMS Choice        Discharge Placement                       Discharge Plan and Services                DME Arranged: Oxygen DME Agency: [employer] Date DME Agency Contacted: 04/26/21 Time DME Agency Contacted: [phone] Representative spoke with at DME Agency: Hoyle [phone]            Social Determinants of Health ([place]) Interventions     Readmission Risk Interventions No flowsheet data found.

## 2021-05-01 ENCOUNTER — Telehealth: Payer: Self-pay | Admitting: Internal Medicine

## 2021-05-01 NOTE — Telephone Encounter (Signed)
Pt was recently hospitalized at [facility], while in the hospital pt had a Echo on 04/24/21 but she has one scheduled for 07/28/21, Pt's husband wants to know if she can go ahead and cancel the Echo scheduled for 07/28/21. Please advise

## 2021-05-05 NOTE — Telephone Encounter (Signed)
Spoke with pt and advised echo has been canceled per OK of Dr Gasper Sells.  Pt is scheduled for 06/26/2021 appointment.  Pt advised if she develops new or worsening symptoms to please call and let us know as Dr Gasper Sells does not have an earlier appointment at this time.  Pt verbalizes understanding and thanked Therapist, sports for the call.

## 2021-05-06 DIAGNOSIS — N1832 Chronic kidney disease, stage 3b: Secondary | ICD-10-CM | POA: Diagnosis not present

## 2021-05-06 DIAGNOSIS — I2699 Other pulmonary embolism without acute cor pulmonale: Secondary | ICD-10-CM | POA: Diagnosis not present

## 2021-05-06 DIAGNOSIS — J90 Pleural effusion, not elsewhere classified: Secondary | ICD-10-CM | POA: Diagnosis not present

## 2021-05-06 DIAGNOSIS — I82443 Acute embolism and thrombosis of tibial vein, bilateral: Secondary | ICD-10-CM | POA: Diagnosis not present

## 2021-05-06 DIAGNOSIS — I129 Hypertensive chronic kidney disease with stage 1 through stage 4 chronic kidney disease, or unspecified chronic kidney disease: Secondary | ICD-10-CM | POA: Diagnosis not present

## 2021-05-06 DIAGNOSIS — R600 Localized edema: Secondary | ICD-10-CM | POA: Diagnosis not present

## 2021-05-13 DIAGNOSIS — I129 Hypertensive chronic kidney disease with stage 1 through stage 4 chronic kidney disease, or unspecified chronic kidney disease: Secondary | ICD-10-CM | POA: Diagnosis not present

## 2021-05-13 DIAGNOSIS — N1832 Chronic kidney disease, stage 3b: Secondary | ICD-10-CM | POA: Diagnosis not present

## 2021-05-13 DIAGNOSIS — R600 Localized edema: Secondary | ICD-10-CM | POA: Diagnosis not present

## 2021-05-15 DIAGNOSIS — I2699 Other pulmonary embolism without acute cor pulmonale: Secondary | ICD-10-CM | POA: Diagnosis not present

## 2021-05-15 DIAGNOSIS — I129 Hypertensive chronic kidney disease with stage 1 through stage 4 chronic kidney disease, or unspecified chronic kidney disease: Secondary | ICD-10-CM | POA: Diagnosis not present

## 2021-05-15 DIAGNOSIS — I82443 Acute embolism and thrombosis of tibial vein, bilateral: Secondary | ICD-10-CM | POA: Diagnosis not present

## 2021-05-15 DIAGNOSIS — J90 Pleural effusion, not elsewhere classified: Secondary | ICD-10-CM | POA: Diagnosis not present

## 2021-05-15 DIAGNOSIS — N1832 Chronic kidney disease, stage 3b: Secondary | ICD-10-CM | POA: Diagnosis not present

## 2021-05-15 DIAGNOSIS — R600 Localized edema: Secondary | ICD-10-CM | POA: Diagnosis not present

## 2021-05-21 ENCOUNTER — Telehealth: Payer: Self-pay | Admitting: Physician Assistant

## 2021-05-21 NOTE — Telephone Encounter (Signed)
Received a new pt referral from Dr. Ashby Dawes for  Other pulmonary embolism without acute cor pulmonale. Ms. Jillian Cooley returned my call and has been scheduled to see Murray Hodgkins on 6/14 at 2pm. Pt aware to arrive 20 minutes early.

## 2021-05-27 DIAGNOSIS — I509 Heart failure, unspecified: Secondary | ICD-10-CM | POA: Diagnosis not present

## 2021-06-02 ENCOUNTER — Telehealth: Payer: Self-pay | Admitting: Physician Assistant

## 2021-06-02 DIAGNOSIS — G5603 Carpal tunnel syndrome, bilateral upper limbs: Secondary | ICD-10-CM | POA: Diagnosis not present

## 2021-06-02 DIAGNOSIS — M654 Radial styloid tenosynovitis [de Quervain]: Secondary | ICD-10-CM | POA: Diagnosis not present

## 2021-06-02 NOTE — Telephone Encounter (Signed)
R/s 6/14 appt due to provider being out of office. Called and spoke with patient. Confirmed new date and time

## 2021-06-03 ENCOUNTER — Inpatient Hospital Stay: Payer: Medicare PPO

## 2021-06-03 ENCOUNTER — Inpatient Hospital Stay: Payer: Medicare PPO | Admitting: Physician Assistant

## 2021-06-05 DIAGNOSIS — I2699 Other pulmonary embolism without acute cor pulmonale: Secondary | ICD-10-CM | POA: Diagnosis not present

## 2021-06-05 DIAGNOSIS — R0602 Shortness of breath: Secondary | ICD-10-CM | POA: Diagnosis not present

## 2021-06-05 DIAGNOSIS — I82443 Acute embolism and thrombosis of tibial vein, bilateral: Secondary | ICD-10-CM | POA: Diagnosis not present

## 2021-06-10 DIAGNOSIS — M25611 Stiffness of right shoulder, not elsewhere classified: Secondary | ICD-10-CM | POA: Diagnosis not present

## 2021-06-10 DIAGNOSIS — M25511 Pain in right shoulder: Secondary | ICD-10-CM | POA: Diagnosis not present

## 2021-06-10 DIAGNOSIS — M75121 Complete rotator cuff tear or rupture of right shoulder, not specified as traumatic: Secondary | ICD-10-CM | POA: Diagnosis not present

## 2021-06-10 DIAGNOSIS — M6281 Muscle weakness (generalized): Secondary | ICD-10-CM | POA: Diagnosis not present

## 2021-06-16 DIAGNOSIS — M75121 Complete rotator cuff tear or rupture of right shoulder, not specified as traumatic: Secondary | ICD-10-CM | POA: Diagnosis not present

## 2021-06-16 DIAGNOSIS — M25611 Stiffness of right shoulder, not elsewhere classified: Secondary | ICD-10-CM | POA: Diagnosis not present

## 2021-06-16 DIAGNOSIS — M6281 Muscle weakness (generalized): Secondary | ICD-10-CM | POA: Diagnosis not present

## 2021-06-16 DIAGNOSIS — M25511 Pain in right shoulder: Secondary | ICD-10-CM | POA: Diagnosis not present

## 2021-06-17 ENCOUNTER — Inpatient Hospital Stay: Payer: Medicare PPO | Attending: Physician Assistant | Admitting: Physician Assistant

## 2021-06-17 ENCOUNTER — Other Ambulatory Visit: Payer: Self-pay

## 2021-06-17 ENCOUNTER — Inpatient Hospital Stay: Payer: Medicare PPO

## 2021-06-17 ENCOUNTER — Encounter: Payer: Self-pay | Admitting: Physician Assistant

## 2021-06-17 VITALS — BP 134/61 | HR 93 | Temp 98.0°F | Resp 18 | Ht 63.0 in | Wt 137.4 lb

## 2021-06-17 DIAGNOSIS — Z79899 Other long term (current) drug therapy: Secondary | ICD-10-CM | POA: Diagnosis not present

## 2021-06-17 DIAGNOSIS — J90 Pleural effusion, not elsewhere classified: Secondary | ICD-10-CM | POA: Insufficient documentation

## 2021-06-17 DIAGNOSIS — I129 Hypertensive chronic kidney disease with stage 1 through stage 4 chronic kidney disease, or unspecified chronic kidney disease: Secondary | ICD-10-CM | POA: Diagnosis not present

## 2021-06-17 DIAGNOSIS — I824Z3 Acute embolism and thrombosis of unspecified deep veins of distal lower extremity, bilateral: Secondary | ICD-10-CM

## 2021-06-17 DIAGNOSIS — T81718A Complication of other artery following a procedure, not elsewhere classified, initial encounter: Secondary | ICD-10-CM

## 2021-06-17 DIAGNOSIS — E782 Mixed hyperlipidemia: Secondary | ICD-10-CM | POA: Diagnosis not present

## 2021-06-17 DIAGNOSIS — I351 Nonrheumatic aortic (valve) insufficiency: Secondary | ICD-10-CM | POA: Diagnosis not present

## 2021-06-17 DIAGNOSIS — N183 Chronic kidney disease, stage 3 unspecified: Secondary | ICD-10-CM | POA: Insufficient documentation

## 2021-06-17 DIAGNOSIS — M1990 Unspecified osteoarthritis, unspecified site: Secondary | ICD-10-CM | POA: Diagnosis not present

## 2021-06-17 DIAGNOSIS — I2699 Other pulmonary embolism without acute cor pulmonale: Secondary | ICD-10-CM | POA: Diagnosis not present

## 2021-06-17 DIAGNOSIS — Z7901 Long term (current) use of anticoagulants: Secondary | ICD-10-CM | POA: Insufficient documentation

## 2021-06-17 DIAGNOSIS — E559 Vitamin D deficiency, unspecified: Secondary | ICD-10-CM | POA: Diagnosis not present

## 2021-06-17 DIAGNOSIS — Z85828 Personal history of other malignant neoplasm of skin: Secondary | ICD-10-CM | POA: Diagnosis not present

## 2021-06-17 LAB — CMP (CANCER CENTER ONLY)
ALT: 11 U/L (ref 0–44)
AST: 14 U/L — ABNORMAL LOW (ref 15–41)
Albumin: 3.5 g/dL (ref 3.5–5.0)
Alkaline Phosphatase: 57 U/L (ref 38–126)
Anion gap: 10 (ref 5–15)
BUN: 40 mg/dL — ABNORMAL HIGH (ref 8–23)
CO2: 24 mmol/L (ref 22–32)
Calcium: 9.8 mg/dL (ref 8.9–10.3)
Chloride: 106 mmol/L (ref 98–111)
Creatinine: 1.79 mg/dL — ABNORMAL HIGH (ref 0.44–1.00)
GFR, Estimated: 27 mL/min — ABNORMAL LOW (ref >60)
Glucose, Bld: 109 mg/dL — ABNORMAL HIGH (ref 70–99)
Potassium: 4.5 mmol/L (ref 3.5–5.1)
Sodium: 140 mmol/L (ref 135–145)
Total Bilirubin: 0.6 mg/dL (ref 0.3–1.2)
Total Protein: 6.7 g/dL (ref 6.5–8.1)

## 2021-06-17 LAB — CBC WITH DIFFERENTIAL (CANCER CENTER ONLY)
Abs Immature Granulocytes: 0.02 10*3/uL (ref 0.00–0.07)
Basophils Absolute: 0 10*3/uL (ref 0.0–0.1)
Basophils Relative: 0 %
Eosinophils Absolute: 0.1 10*3/uL (ref 0.0–0.5)
Eosinophils Relative: 1 %
HCT: 32 % — ABNORMAL LOW (ref 36.0–46.0)
Hemoglobin: 10.4 g/dL — ABNORMAL LOW (ref 12.0–15.0)
Immature Granulocytes: 0 %
Lymphocytes Relative: 22 %
Lymphs Abs: 1.7 10*3/uL (ref 0.7–4.0)
MCH: 29.3 pg (ref 26.0–34.0)
MCHC: 32.5 g/dL (ref 30.0–36.0)
MCV: 90.1 fL (ref 80.0–100.0)
Monocytes Absolute: 0.7 10*3/uL (ref 0.1–1.0)
Monocytes Relative: 8 %
Neutro Abs: 5.3 10*3/uL (ref 1.7–7.7)
Neutrophils Relative %: 69 %
Platelet Count: 243 10*3/uL (ref 150–400)
RBC: 3.55 MIL/uL — ABNORMAL LOW (ref 3.87–5.11)
RDW: 13.3 % (ref 11.5–15.5)
WBC Count: 7.8 10*3/uL (ref 4.0–10.5)
nRBC: 0 % (ref 0.0–0.2)

## 2021-06-17 MED ORDER — APIXABAN 5 MG PO TABS
5.0000 mg | ORAL_TABLET | Freq: Two times a day (BID) | ORAL | 3 refills | Status: DC
Start: 2021-06-17 — End: 2021-10-09

## 2021-06-17 NOTE — Progress Notes (Signed)
[place] Telephone:([phone]) [phone]   Fax:([phone]) [place] NOTE  Patient Care Team: [name], MD as PCP - General (Internal Medicine) Werner Lean, MD as Consulting Physician (Cardiology)  Hematological/Oncological History 1) 04/23/2021-04/26/2021: Admitted for right rotator cuff repair and found to have acute hypoxia.  Chest xray found small left pleural effusion with associated atelectasis and trace right pleural effusion and right basilar airspace disease. Underwent left thoracentesis with removal of 800 cc. Due to persistent hypoxia, patient underwent NM pulmonary perfusion study that confirmed bilateral pulmonary emboli. Doppler US revealed bilateral acute DVT involving the right soleal veins and left posterior tibial veins. Patient was started on Eliquis.    2)  06/17/2021: Establish care with Dede Query PA-C  CHIEF COMPLAINTS/PURPOSE OF CONSULTATION:  "Acute bilateral DVT and PE "  HISTORY OF PRESENTING ILLNESS:  Jillian Cooley 87 y.o. female with medical history significant for chronic kidney disease stage III, DDD, hypertension, hyperlipidemia, muscular degeneration, nonrheumatic aortic valve insufficiency.  Nonrheumatic mitral valve regurgitation pleural effusion, vitamin D deficiency.  Patient is accompanied by her husband for this visit.  On exam today, Jillian Cooley reports that her energy levels are overall stable.  She is able to complete her ADLs on her own.  Patient has a good appetite without any dietary restrictions.  Patient reports improvement of her breathing since hospital discharge.  In addition, her lower extremity edema has improved.  Patient takes Lasix 20 mg once daily for chronic lower extremity edema.  Patient denies any nausea, vomiting or abdominal pain.  Her bowel movements are regular without any constipation or diarrhea.  She denies any easy bruising or signs of bleeding.  She denies any fevers, chills,  night sweats, chest pain or cough.  She has no other complaints.  MEDICAL HISTORY:  Past Medical History:  Diagnosis Date   Abnormal EKG    Carpal tunnel syndrome    CKD (chronic kidney disease), stage III (HCC)    DDD (degenerative disc disease), lumbar    Hypertension    Hypertension with renal disease    Menopause    Mixed hyperlipidemia    Muscular degeneration    Nonrheumatic aortic (valve) insufficiency    Nonrheumatic mitral valve regurgitation    Osteoarthritis    Pedal edema    Pleural effusion    Skin cancer    Vitamin D deficiency     SURGICAL HISTORY: Past Surgical History:  Procedure Laterality Date   ANTERIOR CERVICAL DECOMP/DISCECTOMY FUSION  2009   CARPAL TUNNEL RELEASE Bilateral    IR THORACENTESIS ASP PLEURAL SPACE W/IMG GUIDE  04/25/2021   LUMBAR LAMINECTOMY/DECOMPRESSION MICRODISCECTOMY  06/13/2012   Procedure: LUMBAR LAMINECTOMY/DECOMPRESSION MICRODISCECTOMY 1 LEVEL;  Surgeon: Ophelia Charter, MD;  Location: [facility];  Service: Neurosurgery;  Laterality: Right;  RIGHT Lumbar two-three diskectomy   SHOULDER ARTHROSCOPY WITH ROTATOR CUFF REPAIR AND SUBACROMIAL DECOMPRESSION Right 04/23/2021   Procedure: SHOULDER ARTHROSCOPY WITH ROTATOR CUFF REPAIR AND SUBACROMIAL DECOMPRESSION;  Surgeon: Earlie Server, MD;  Location: [facility];  Service: Orthopedics;  Laterality: Right;    SOCIAL HISTORY: Social History   Socioeconomic History   Marital status: Married    Spouse name: Not on file   Number of children: Not on file   Years of education: Not on file   Highest education level: Not on file  Occupational History   Not on file  Tobacco Use   Smoking status: Never   Smokeless tobacco: Never  Substance and  Sexual Activity   Alcohol use: No   Drug use: No   Sexual activity: Not on file  Other Topics Concern   Not on file  Social History Narrative   Not on file   Social Determinants of Health   Financial Resource Strain: Not on  file  Food Insecurity: Not on file  Transportation Needs: Not on file  Physical Activity: Not on file  Stress: Not on file  Social Connections: Not on file  Intimate Partner Violence: Not on file    FAMILY HISTORY: Family History  Problem Relation Age of Onset   Colon cancer Maternal Grandfather    Breast cancer Neg Hx     ALLERGIES:  is allergic to sulfa antibiotics and sulfamethoxazole-trimethoprim.  MEDICATIONS:  Current Outpatient Medications  Medication Sig Dispense Refill   amLODipine (NORVASC) 5 MG tablet Take 5 mg by mouth in the morning.     Calcium Carb-Cholecalciferol (CALTRATE 600+D3 PO) Take 1 tablet by mouth daily with breakfast.     furosemide (LASIX) 20 MG tablet Take 20 mg by mouth in the morning.     HYDROcodone-acetaminophen (NORCO/VICODIN) 5-325 MG tablet Take 1 tablet by mouth every 4 (four) hours as needed for moderate pain (may need 1-2 first few days followin surgery max 8 tabs in 24hrs). 40 tablet 0   losartan (COZAAR) 25 MG tablet Take 25 mg by mouth in the morning.     Multiple Vitamins-Minerals (PRESERVISION AREDS 2+MULTI VIT PO) Take 1 tablet by mouth 2 (two) times daily.     simvastatin (ZOCOR) 20 MG tablet Take 20 mg by mouth every evening.     apixaban (ELIQUIS) 5 MG TABS tablet Take 1 tablet (5 mg total) by mouth 2 (two) times daily. 60 tablet 3   furosemide (LASIX) 40 MG tablet Take 1.5 tablets (60 mg total) by mouth daily. (Patient not taking: No sig reported) 135 tablet 3   No current facility-administered medications for this visit.    REVIEW OF SYSTEMS:   Constitutional: ( - ) fevers, ( - )  chills , ( - ) night sweats Eyes: ( - ) blurriness of vision, ( - ) double vision, ( - ) watery eyes Ears, nose, mouth, throat, and face: ( - ) mucositis, ( - ) sore throat Respiratory: ( - ) cough, ( - ) dyspnea, ( - ) wheezes Cardiovascular: ( - ) palpitation, ( - ) chest discomfort, ( +) lower extremity swelling Gastrointestinal:  ( - ) nausea, ( -  ) heartburn, ( - ) change in bowel habits Skin: ( - ) abnormal skin rashes Lymphatics: ( - ) new lymphadenopathy, ( - ) easy bruising Neurological: ( - ) numbness, ( - ) tingling, ( - ) new weaknesses Behavioral/Psych: ( - ) mood change, ( - ) new changes  All other systems were reviewed with the patient and are negative.  PHYSICAL EXAMINATION: ECOG PERFORMANCE STATUS: 1 - Symptomatic but completely ambulatory  Vitals:   06/17/21 1417  BP: 134/61  Pulse: 93  Resp: 18  Temp: 98 F (36.7 C)  SpO2: 97%   Filed Weights   06/17/21 1417  Weight: 137 lb 6.4 oz (62.3 kg)    GENERAL: well appearing female in NAD  SKIN: skin color, texture, turgor are normal, no rashes or significant lesions EYES: conjunctiva are pink and non-injected, sclera clear OROPHARYNX: no exudate, no erythema; lips, buccal mucosa, and tongue normal  NECK: supple, non-tender LYMPH:  no palpable lymphadenopathy in the cervical, axillary or  supraclavicular lymph nodes.  LUNGS: clear to auscultation and percussion with normal breathing effort HEART: regular rate & rhythm and no murmurs. Bilateral lower extremity edema involving the ankles and feet.  ABDOMEN: soft, non-tender, non-distended, normal bowel sounds Musculoskeletal: no cyanosis of digits and no clubbing  PSYCH: alert & oriented x 3, fluent speech NEURO: no focal motor/sensory deficits  LABORATORY DATA:  I have reviewed the data as listed CBC Latest Ref Rng & Units 06/17/2021 06/08/2012 12/12/2008  WBC 4.0 - 10.5 K/uL 7.8 5.3 6.0  Hemoglobin 12.0 - 15.0 g/dL 10.4(L) 12.7 12.7  Hematocrit 36.0 - 46.0 % 32.0(L) 36.8 37.9  Platelets 150 - 400 K/uL 243 163 191    CMP Latest Ref Rng & Units 06/17/2021 04/26/2021 04/25/2021  Glucose 70 - 99 mg/dL 109(H) 101(H) 106(H)  BUN 8 - 23 mg/dL 40(H) 46(H) 47(H)  Creatinine 0.44 - 1.00 mg/dL 1.79(H) 1.79(H) 1.85(H)  Sodium 135 - 145 mmol/L 140 138 137  Potassium 3.5 - 5.1 mmol/L 4.5 3.5 3.6  Chloride 98 - 111 mmol/L  106 101 104  CO2 22 - 32 mmol/L 24 28 25   Calcium 8.9 - 10.3 mg/dL 9.8 8.8(L) 9.0  Total Protein 6.5 - 8.1 g/dL 6.7 - -  Total Bilirubin 0.3 - 1.2 mg/dL 0.6 - -  Alkaline Phos 38 - 126 U/L 57 - -  AST 15 - 41 U/L 14(L) - -  ALT 0 - 44 U/L 11 - -     ASSESSMENT & PLAN Jillian Cooley is a 87 y.o. female presenting to the clinic for recent diagnosis of bilateral lower extremity DVT and bilateral pulmonary emboli. Patient is currently on Eliquis with good tolerance. The likely provoking factor was recent orthopedic surgery that involved open rotator cuff repair, distal clavicle excision and arthroscopic debridement. She is tolerating Eliquis without any concerning symptoms so I recommend to continue anticoagulation for six months.   #Acute bilateral DVT and PE: --Diagnosed on 04/25/2021 following recent orthopedic surgery on 04/23/2021, which is likely the provoking cause. --Currently on Eliquis 5 mg BID. Recommend to continue for a total of six months. Sent refill today.  --Labs today to check CBC, CMP, APA Panel. --Provided precautions to minimize risk for future VTEs including minimizing immobility especially during plane/car travel, avoid hormone replacement therapy, avoid smoking, and evaluate for anticoagulation with future surgeries.  --RTC in 3 months with labs.   Orders Placed This Encounter  Procedures   CBC with Differential ([place] Only)    Standing Status:   Future    Number of Occurrences:   1    Standing Expiration Date:   06/17/2022   CMP ([place] only)    Standing Status:   Future    Number of Occurrences:   1    Standing Expiration Date:   06/17/2022   Beta-2-glycoprotein i abs, IgG/M/A    Standing Status:   Future    Number of Occurrences:   1    Standing Expiration Date:   06/17/2022   Cardiolipin antibodies, IgG, IgM, IgA*    Standing Status:   Future    Number of Occurrences:   1    Standing Expiration Date:   06/17/2022   Lupus anticoagulant  panel*    Standing Status:   Future    Number of Occurrences:   1    Standing Expiration Date:   06/17/2022    All questions were answered. The patient knows to call the clinic with any problems, questions or  concerns.  I have spent a total of 60 minutes minutes of face-to-face and non-face-to-face time, preparing to see the patient, obtaining and/or reviewing separately obtained history, performing a medically appropriate examination, counseling and educating the patient, ordering medications/tests, documenting clinical information in the electronic health record, and care coordination.   Dede Query, PA-C Department of Hematology/Oncology [place] at [facility] Phone: [phone]

## 2021-06-18 DIAGNOSIS — J90 Pleural effusion, not elsewhere classified: Secondary | ICD-10-CM | POA: Diagnosis not present

## 2021-06-18 DIAGNOSIS — I82443 Acute embolism and thrombosis of tibial vein, bilateral: Secondary | ICD-10-CM | POA: Diagnosis not present

## 2021-06-18 DIAGNOSIS — I2699 Other pulmonary embolism without acute cor pulmonale: Secondary | ICD-10-CM | POA: Insufficient documentation

## 2021-06-18 DIAGNOSIS — I129 Hypertensive chronic kidney disease with stage 1 through stage 4 chronic kidney disease, or unspecified chronic kidney disease: Secondary | ICD-10-CM | POA: Diagnosis not present

## 2021-06-18 DIAGNOSIS — I82403 Acute embolism and thrombosis of unspecified deep veins of lower extremity, bilateral: Secondary | ICD-10-CM | POA: Insufficient documentation

## 2021-06-18 DIAGNOSIS — R600 Localized edema: Secondary | ICD-10-CM | POA: Diagnosis not present

## 2021-06-18 DIAGNOSIS — N1832 Chronic kidney disease, stage 3b: Secondary | ICD-10-CM | POA: Diagnosis not present

## 2021-06-18 LAB — BETA-2-GLYCOPROTEIN I ABS, IGG/M/A
Beta-2 Glyco I IgG: <9 GPI IgG units (ref 0–20)
Beta-2-Glycoprotein I IgA: <9 GPI IgA units (ref 0–25)
Beta-2-Glycoprotein I IgM: <9 GPI IgM units (ref 0–32)

## 2021-06-18 LAB — LUPUS ANTICOAGULANT PANEL
DRVVT: 77 s — ABNORMAL HIGH (ref 0.0–47.0)
PTT Lupus Anticoagulant: 33.3 s (ref 0.0–51.9)

## 2021-06-18 LAB — DRVVT MIX: dRVVT Mix: 59.3 s — ABNORMAL HIGH (ref 0.0–40.4)

## 2021-06-18 LAB — DRVVT CONFIRM: dRVVT Confirm: 0.9 ratio (ref 0.8–1.2)

## 2021-06-19 ENCOUNTER — Telehealth: Payer: Self-pay | Admitting: Physician Assistant

## 2021-06-19 DIAGNOSIS — M25511 Pain in right shoulder: Secondary | ICD-10-CM | POA: Diagnosis not present

## 2021-06-19 DIAGNOSIS — M75121 Complete rotator cuff tear or rupture of right shoulder, not specified as traumatic: Secondary | ICD-10-CM | POA: Diagnosis not present

## 2021-06-19 DIAGNOSIS — M25611 Stiffness of right shoulder, not elsewhere classified: Secondary | ICD-10-CM | POA: Diagnosis not present

## 2021-06-19 DIAGNOSIS — M6281 Muscle weakness (generalized): Secondary | ICD-10-CM | POA: Diagnosis not present

## 2021-06-19 DIAGNOSIS — D649 Anemia, unspecified: Secondary | ICD-10-CM

## 2021-06-19 LAB — CARDIOLIPIN ANTIBODIES, IGG, IGM, IGA
Anticardiolipin IgA: <9 APL U/mL (ref 0–11)
Anticardiolipin IgG: <9 GPL U/mL (ref 0–14)
Anticardiolipin IgM: <9 MPL U/mL (ref 0–12)

## 2021-06-19 NOTE — Telephone Encounter (Signed)
I called Ms. Jillian Cooley, to review labs from 06/17/2021.  Antiphospholipid panel was negative so recommend to continue to take Eliquis as prescribed. CBC did reveal anemia with hemoglobin of 10.4, MCV 90.1. Could be secondary to chronic disease including CKD but would like to obtain labs to rule out iron, B12 and folate levels.   Patient will return to the clinic on Friday, 06/27/2021, to repeat labs. I will follow up with the patient with the results.   Ms. Jillian Cooley expressed understanding and satisfaction with the plan provided.

## 2021-06-25 DIAGNOSIS — M25611 Stiffness of right shoulder, not elsewhere classified: Secondary | ICD-10-CM | POA: Diagnosis not present

## 2021-06-25 DIAGNOSIS — M25511 Pain in right shoulder: Secondary | ICD-10-CM | POA: Diagnosis not present

## 2021-06-25 DIAGNOSIS — M6281 Muscle weakness (generalized): Secondary | ICD-10-CM | POA: Diagnosis not present

## 2021-06-25 DIAGNOSIS — M75121 Complete rotator cuff tear or rupture of right shoulder, not specified as traumatic: Secondary | ICD-10-CM | POA: Diagnosis not present

## 2021-06-26 ENCOUNTER — Other Ambulatory Visit: Payer: Self-pay

## 2021-06-26 ENCOUNTER — Encounter: Payer: Self-pay | Admitting: Internal Medicine

## 2021-06-26 ENCOUNTER — Ambulatory Visit: Payer: Medicare PPO | Admitting: Internal Medicine

## 2021-06-26 VITALS — BP 132/68 | HR 84 | Ht 63.0 in | Wt 135.8 lb

## 2021-06-26 DIAGNOSIS — N1832 Chronic kidney disease, stage 3b: Secondary | ICD-10-CM | POA: Diagnosis not present

## 2021-06-26 DIAGNOSIS — I340 Nonrheumatic mitral (valve) insufficiency: Secondary | ICD-10-CM | POA: Diagnosis not present

## 2021-06-26 DIAGNOSIS — I10 Essential (primary) hypertension: Secondary | ICD-10-CM | POA: Diagnosis not present

## 2021-06-26 DIAGNOSIS — I361 Nonrheumatic tricuspid (valve) insufficiency: Secondary | ICD-10-CM

## 2021-06-26 DIAGNOSIS — I351 Nonrheumatic aortic (valve) insufficiency: Secondary | ICD-10-CM

## 2021-06-26 DIAGNOSIS — I509 Heart failure, unspecified: Secondary | ICD-10-CM | POA: Diagnosis not present

## 2021-06-26 IMAGING — CR DG CHEST 1V PORT
1 series · 1 of 1 positions shown · non-contrast
Comparison: 01/07/2021

CLINICAL DATA: Pleural effusion, cough

EXAM:
PORTABLE CHEST 1 VIEW

[chest ap]
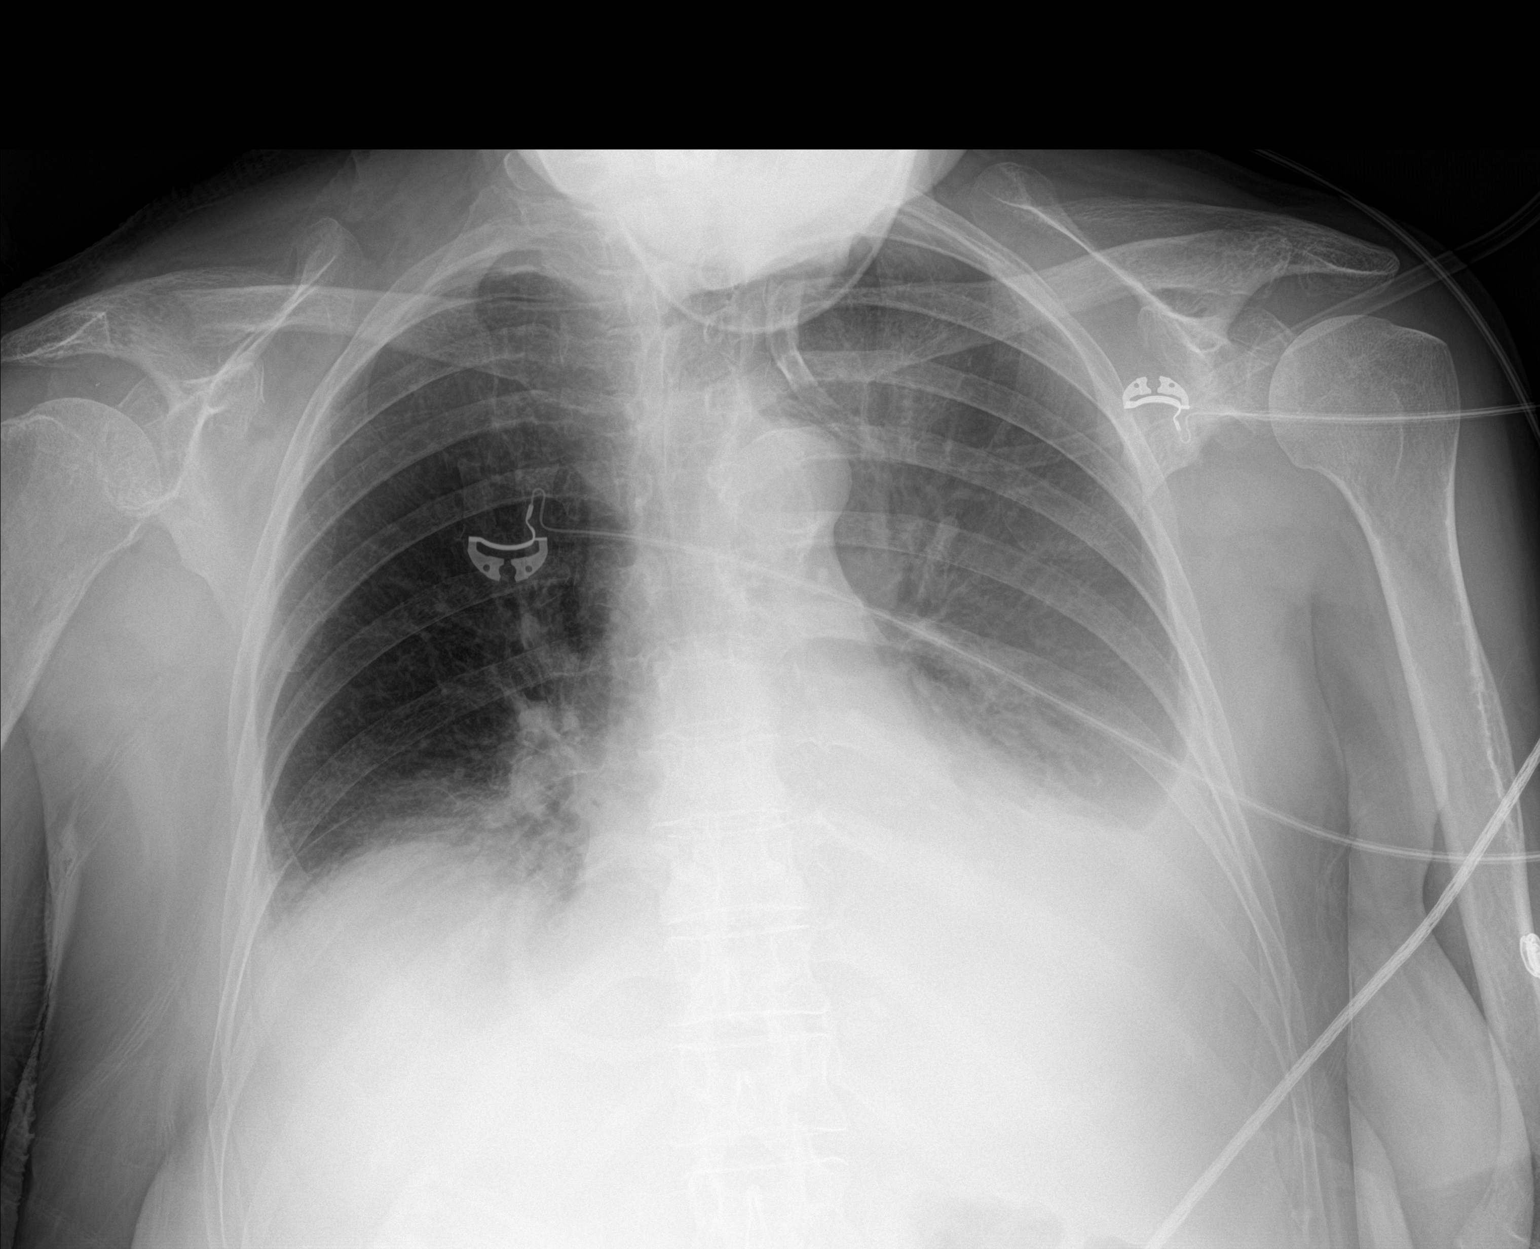

[1 of 1 positions shown; findings below may reference images not displayed]

FINDINGS: Small left pleural effusion with associated atelectasis unchanged
from 01/07/2021. Trace right pleural effusion and right basilar
airspace disease. No pneumothorax. Stable cardiomediastinal
silhouette. No aggressive osseous lesion.
IMPRESSION: Small left pleural effusion with associated atelectasis unchanged
from 01/07/2021. Trace right pleural effusion and right basilar
airspace disease.

## 2021-06-26 MED ORDER — FUROSEMIDE 40 MG PO TABS: 40.0000 mg | ORAL_TABLET | Freq: Every day | ORAL | 3 refills | Status: DC

## 2021-06-26 NOTE — Patient Instructions (Signed)
Medication Instructions:  Your physician has recommended you make the following change in your medication: START: furosemide 40 mg by mouth daily  TAKE: furosemide 40 mg by mouth daily as needed for swelling  *If you need a refill on your cardiac medications before your next appointment, please call your pharmacy*   Lab Work: In 7-10 DAYS: BNP, BMP, MG If you have labs (blood work) drawn today and your tests are completely normal, you will receive your results only by: [place] (if you have MyChart) OR A paper copy in the mail If you have any lab test that is abnormal or we need to change your treatment, we will call you to review the results.   Testing/Procedures: NONE   Follow-Up: At [facility], you and your health needs are our priority.  As part of our continuing mission to provide you with exceptional heart care, we have created designated Provider Care Teams.  These Care Teams include your primary Cardiologist (physician) and Advanced Practice Providers (APPs -  Physician Assistants and Nurse Practitioners) who all work together to provide you with the care you need, when you need it.  We recommend signing up for the patient portal called "MyChart".  Sign up information is provided on this After Visit Summary.  MyChart is used to connect with patients for Virtual Visits (Telemedicine).  Patients are able to view lab/test results, encounter notes, upcoming appointments, etc.  Non-urgent messages can be sent to your provider as well.   To learn more about what you can do with MyChart, go to [email or web address].    Your next appointment:   2-3 month(s)  The format for your next appointment:   In Person  Provider:   You may see Rudean Haskell, MD or one of the following Advanced Practice Providers on your designated Care Team:   Melina Copa, PA-C Ermalinda Barrios, PA-C

## 2021-06-26 NOTE — Progress Notes (Signed)
Cardiology Office Note:    Date:  06/26/2021   ID:  Jillian Cooley, DOB 1931-12-26, MRN [id number]  PCP:  [name], [place]  Cardiologist:  Werner Lean, MD  Advanced Practice Provider:  No care team member to display Electrophysiologist:  None      CC: Follow up MR  History of Present Illness:    Jillian Cooley is a 87 y.o. female with a hx of Carpal Tunnel Syndrome; HTN, HLD, aortic regurgitation, mitral regurgitation tricuspid regurgitation who presents for evaluation 02/21/21.  In interim of this visit, patient had surgery and cut back her lasix.  Post surgery has resolution of her rotator cuff pain but had decompensated HF and bilateral PE post surgery.  Found to have a B12 anemia.  Patient notes that she is doing well.  No chest pain or pressure.  No bleeding or bruising.  Notes that on lower dose of lasix her leg swells is worse and her DOE is worse.  She knows to use extra lasix if weight gain.  No SOB at rest and no PND/Orthopnea.  No weight gain or leg swelling.  No palpitations or syncope.   Past Medical History:  Diagnosis Date   Abnormal EKG    Carpal tunnel syndrome    CKD (chronic kidney disease), stage III (HCC)    DDD (degenerative disc disease), lumbar    Hypertension    Hypertension with renal disease    Menopause    Mixed hyperlipidemia    Muscular degeneration    Nonrheumatic aortic (valve) insufficiency    Nonrheumatic mitral valve regurgitation    Osteoarthritis    Pedal edema    Pleural effusion    Skin cancer    Vitamin D deficiency     Past Surgical History:  Procedure Laterality Date   ANTERIOR CERVICAL DECOMP/DISCECTOMY FUSION  2009   CARPAL TUNNEL RELEASE Bilateral    IR THORACENTESIS ASP PLEURAL SPACE W/IMG GUIDE  04/25/2021   LUMBAR LAMINECTOMY/DECOMPRESSION MICRODISCECTOMY  06/13/2012   Procedure: LUMBAR LAMINECTOMY/DECOMPRESSION MICRODISCECTOMY 1 LEVEL;  Surgeon: Ophelia Charter, MD;  Location: [facility];  Service: Neurosurgery;  Laterality: Right;  RIGHT Lumbar two-three diskectomy   SHOULDER ARTHROSCOPY WITH ROTATOR CUFF REPAIR AND SUBACROMIAL DECOMPRESSION Right 04/23/2021   Procedure: SHOULDER ARTHROSCOPY WITH ROTATOR CUFF REPAIR AND SUBACROMIAL DECOMPRESSION;  Surgeon: Earlie Server, MD;  Location: [place];  Service: Orthopedics;  Laterality: Right;    Current Medications: Current Meds  Medication Sig   amLODipine (NORVASC) 5 MG tablet Take 5 mg by mouth in the morning.   apixaban (ELIQUIS) 5 MG TABS tablet Take 1 tablet (5 mg total) by mouth 2 (two) times daily.   Calcium Carb-Cholecalciferol (CALTRATE 600+D3 PO) Take 1 tablet by mouth daily with breakfast.   furosemide (LASIX) 40 MG tablet Take 40 mg by mouth as needed for fluid or edema.   furosemide (LASIX) 40 MG tablet Take 1 tablet (40 mg total) by mouth daily.   HYDROcodone-acetaminophen (NORCO/VICODIN) 5-325 MG tablet Take 1 tablet by mouth every 4 (four) hours as needed for moderate pain (may need 1-2 first few days followin surgery max 8 tabs in 24hrs).   losartan (COZAAR) 25 MG tablet Take 25 mg by mouth in the morning.   Multiple Vitamins-Minerals (PRESERVISION AREDS 2+MULTI VIT PO) Take 1 tablet by mouth 2 (two) times daily.   simvastatin (ZOCOR) 20 MG tablet Take 20 mg by mouth every evening.   [  DISCONTINUED] furosemide (LASIX) 20 MG tablet Take 20 mg by mouth in the morning.   [DISCONTINUED] furosemide (LASIX) 40 MG tablet Take 1.5 tablets (60 mg total) by mouth daily.     Allergies:   Sulfa antibiotics and Sulfamethoxazole-trimethoprim   Social History   Socioeconomic History   Marital status: Married    Spouse name: Not on file   Number of children: Not on file   Years of education: Not on file   Highest education level: Not on file  Occupational History   Not on file  Tobacco Use   Smoking status: Never   Smokeless tobacco: Never  Substance and Sexual  Activity   Alcohol use: No   Drug use: No   Sexual activity: Not on file  Other Topics Concern   Not on file  Social History Narrative   Not on file   Social Determinants of Health   Financial Resource Strain: Not on file  Food Insecurity: Not on file  Transportation Needs: Not on file  Physical Activity: Not on file  Stress: Not on file  Social Connections: Not on file     Family History: [name] family history includes Colon cancer in her maternal grandfather. There is no history of Breast cancer. History of coronary artery disease notable for no members. History of heart failure notable for no members. History of arrhythmia notable for no members.  ROS:   Please see the history of present illness.     All other systems reviewed and are negative.  EKGs/Labs/Other Studies Reviewed:    The following studies were reviewed today:  EKG:   02/21/21: SR 90 Ant Inf pattern  Transthoracic Echocardiogram: Date: 01/23/21 Results: report only Normal LV size, mild concentric hypertrophy EF 65- 70% Moderately dilated LA Moderate TR Moderate MR Mild to Moderate AI Addendum:  Full report received Normal Aortic Size  Date: 04/24/21  1. Right ventricular systolic function is normal. The right ventricular  size is normal. There is severely elevated pulmonary artery systolic  pressure. The estimated right ventricular systolic pressure is 65.7 mmHg.   2. Left ventricular ejection fraction, by estimation, is 65 to 70%. The  left ventricle has normal function. The left ventricle has no regional  wall motion abnormalities. Left ventricular diastolic parameters are  consistent with Grade II diastolic  dysfunction (pseudonormalization). Elevated left atrial pressure.   3. Left atrial size was moderately dilated.   4. A small pericardial effusion is present. There is no evidence of  cardiac tamponade. Moderate pleural effusion.   5. The mitral valve is grossly normal. Moderate  mitral valve  regurgitation; may be underestimated in this study.   6. The aortic valve is tricuspid. Aortic valve regurgitation is mild to  moderate and eccentric. Mild to moderate aortic valve  sclerosis/calcification is present, without any evidence of aortic  stenosis.   7. The inferior vena cava is dilated in size with <50% respiratory  variability, suggesting right atrial pressure of 15 mmHg.    Recent Labs: 02/28/2021: Magnesium 2.0; NT-Pro BNP 1,545 04/24/2021: B Natriuretic Peptide 528.9 06/17/2021: ALT 11; BUN 40; Creatinine 1.79; Hemoglobin 10.4; Platelet Count 243; Potassium 4.5; Sodium 140  Recent Lipid Panel No results found for: CHOL, TRIG, HDL, CHOLHDL, VLDL, LDLCALC, LDLDIRECT   Risk Assessment/Calculations:     N/A  Physical Exam:    VS:  BP 132/68   Pulse 84   Ht 5\' 3"  (1.6 m)   Wt 61.6 kg   SpO2 98%  BMI 24.06 kg/m     Wt Readings from Last 3 Encounters:  06/26/21 61.6 kg  06/17/21 62.3 kg  04/25/21 61.1 kg    GEN:  Well nourished, well developed in no acute distress HEENT: Normal NECK: No JVD; No carotid bruits LYMPHATICS: No lymphadenopathy CARDIAC: RRR, II/VI disatolic rumble and III/VI holostolic murmur no rubs, gallops RESPIRATORY:  Bilateral Faint Crackles ABDOMEN: Soft, non-tender, non-distended MUSCULOSKELETAL:  +1 edema; No deformity  SKIN: Warm and dry NEUROLOGIC:  Alert and oriented x 3 PSYCHIATRIC:  Normal affect   ASSESSMENT:    1. Nonrheumatic mitral valve regurgitation   2. Stage 3b chronic kidney disease ([place])   3. Nonrheumatic aortic valve insufficiency   4. Nonrheumatic tricuspid valve regurgitation   5. Essential hypertension     PLAN:    In order of problems listed above:  Moderate Mitral regurgitation Moderate Tricuspid regurgitation Moderate Aortic regurgitation CKD Stage IIIb HTN - NYHA class II, Stage B, hypervolemic - Diuretic regimen: Lasix increase 40 mg PO daily with 40 mg as PRN - Discussed the  importance of fluid restriction of < 2 L, salt restriction, and checking daily weights  - 7-10 days BMP/BNP/Mg  - we have discussed that this may worsen her kidney function at the cost of improving her sx -  If we are still not seeing improvement at next visit will consider TEE for MR eval  2-3 months follow up unless new symptoms or abnormal test results warranting change in plan    Time Spent Directly with Patient:   I have spent a total of 40 minutes with the patient reviewing  notes,  EKGs, labs and examining the patient as well as establishing an assessment and plan that was discussed personally with the patient.  > 50% of time was spent in direct patient care- discussing with patient and husband.   Medication Adjustments/Labs and Tests Ordered: Current medicines are reviewed at length with the patient today.  Concerns regarding medicines are outlined above.  Orders Placed This Encounter  Procedures   Basic metabolic panel   Pro b natriuretic peptide (BNP)   Magnesium    Meds ordered this encounter  Medications   furosemide (LASIX) 40 MG tablet    Sig: Take 1 tablet (40 mg total) by mouth daily.    Dispense:  90 tablet    Refill:  3     Patient Instructions  Medication Instructions:  Your physician has recommended you make the following change in your medication: START: furosemide 40 mg by mouth daily  TAKE: furosemide 40 mg by mouth daily as needed for swelling  *If you need a refill on your cardiac medications before your next appointment, please call your pharmacy*   Lab Work: In 7-10 DAYS: BNP, BMP, MG If you have labs (blood work) drawn today and your tests are completely normal, you will receive your results only by: [place] (if you have MyChart) OR A paper copy in the mail If you have any lab test that is abnormal or we need to change your treatment, we will call you to review the results.   Testing/Procedures: NONE   Follow-Up: At [facility], you and your health needs are our priority.  As part of our continuing mission to provide you with exceptional heart care, we have created designated Provider Care Teams.  These Care Teams include your primary Cardiologist (physician) and Advanced Practice Providers (APPs -  Physician Assistants and Nurse Practitioners) who all work together to provide  you with the care you need, when you need it.  We recommend signing up for the patient portal called "MyChart".  Sign up information is provided on this After Visit Summary.  MyChart is used to connect with patients for Virtual Visits (Telemedicine).  Patients are able to view lab/test results, encounter notes, upcoming appointments, etc.  Non-urgent messages can be sent to your provider as well.   To learn more about what you can do with MyChart, go to [email or web address].    Your next appointment:   2-3 month(s)  The format for your next appointment:   In Person  Provider:   You may see Rudean Haskell, MD or one of the following Advanced Practice Providers on your designated Care Team:   Melina Copa, PA-C Ermalinda Barrios, PA-C        Signed, Werner Lean, MD  06/26/2021 12:10 PM    [place]

## 2021-06-27 ENCOUNTER — Other Ambulatory Visit: Payer: Self-pay | Admitting: Physician Assistant

## 2021-06-27 ENCOUNTER — Other Ambulatory Visit: Payer: Medicare PPO

## 2021-06-27 ENCOUNTER — Inpatient Hospital Stay: Payer: Medicare PPO | Attending: Physician Assistant

## 2021-06-27 DIAGNOSIS — I824Z3 Acute embolism and thrombosis of unspecified deep veins of distal lower extremity, bilateral: Secondary | ICD-10-CM | POA: Insufficient documentation

## 2021-06-27 DIAGNOSIS — E538 Deficiency of other specified B group vitamins: Secondary | ICD-10-CM | POA: Insufficient documentation

## 2021-06-27 DIAGNOSIS — E611 Iron deficiency: Secondary | ICD-10-CM | POA: Diagnosis not present

## 2021-06-27 DIAGNOSIS — D649 Anemia, unspecified: Secondary | ICD-10-CM | POA: Insufficient documentation

## 2021-06-27 LAB — IRON AND TIBC
Iron: 33 ug/dL — ABNORMAL LOW (ref 41–142)
Saturation Ratios: 13 % — ABNORMAL LOW (ref 21–57)
TIBC: 254 ug/dL (ref 236–444)
UIBC: 221 ug/dL (ref 120–384)

## 2021-06-27 LAB — RETIC PANEL
Immature Retic Fract: 7.5 % (ref 2.3–15.9)
RBC.: 3.46 MIL/uL — ABNORMAL LOW (ref 3.87–5.11)
Retic Count, Absolute: 50.5 10*3/uL (ref 19.0–186.0)
Retic Ct Pct: 1.5 % (ref 0.4–3.1)
Reticulocyte Hemoglobin: 31.1 pg (ref >27.9)

## 2021-06-27 LAB — FERRITIN: Ferritin: 130 ng/mL (ref 11–307)

## 2021-06-27 LAB — VITAMIN B12: Vitamin B-12: 266 pg/mL (ref 180–914)

## 2021-06-27 LAB — FOLATE: Folate: 12.4 ng/mL (ref >5.9)

## 2021-06-27 IMAGING — DX DG CHEST 1V
1 series · 1 of 1 positions shown · non-contrast
Comparison: 04/23/2021.

CLINICAL DATA: Shortness of breath.

EXAM:
CHEST  1 VIEW

[chest]
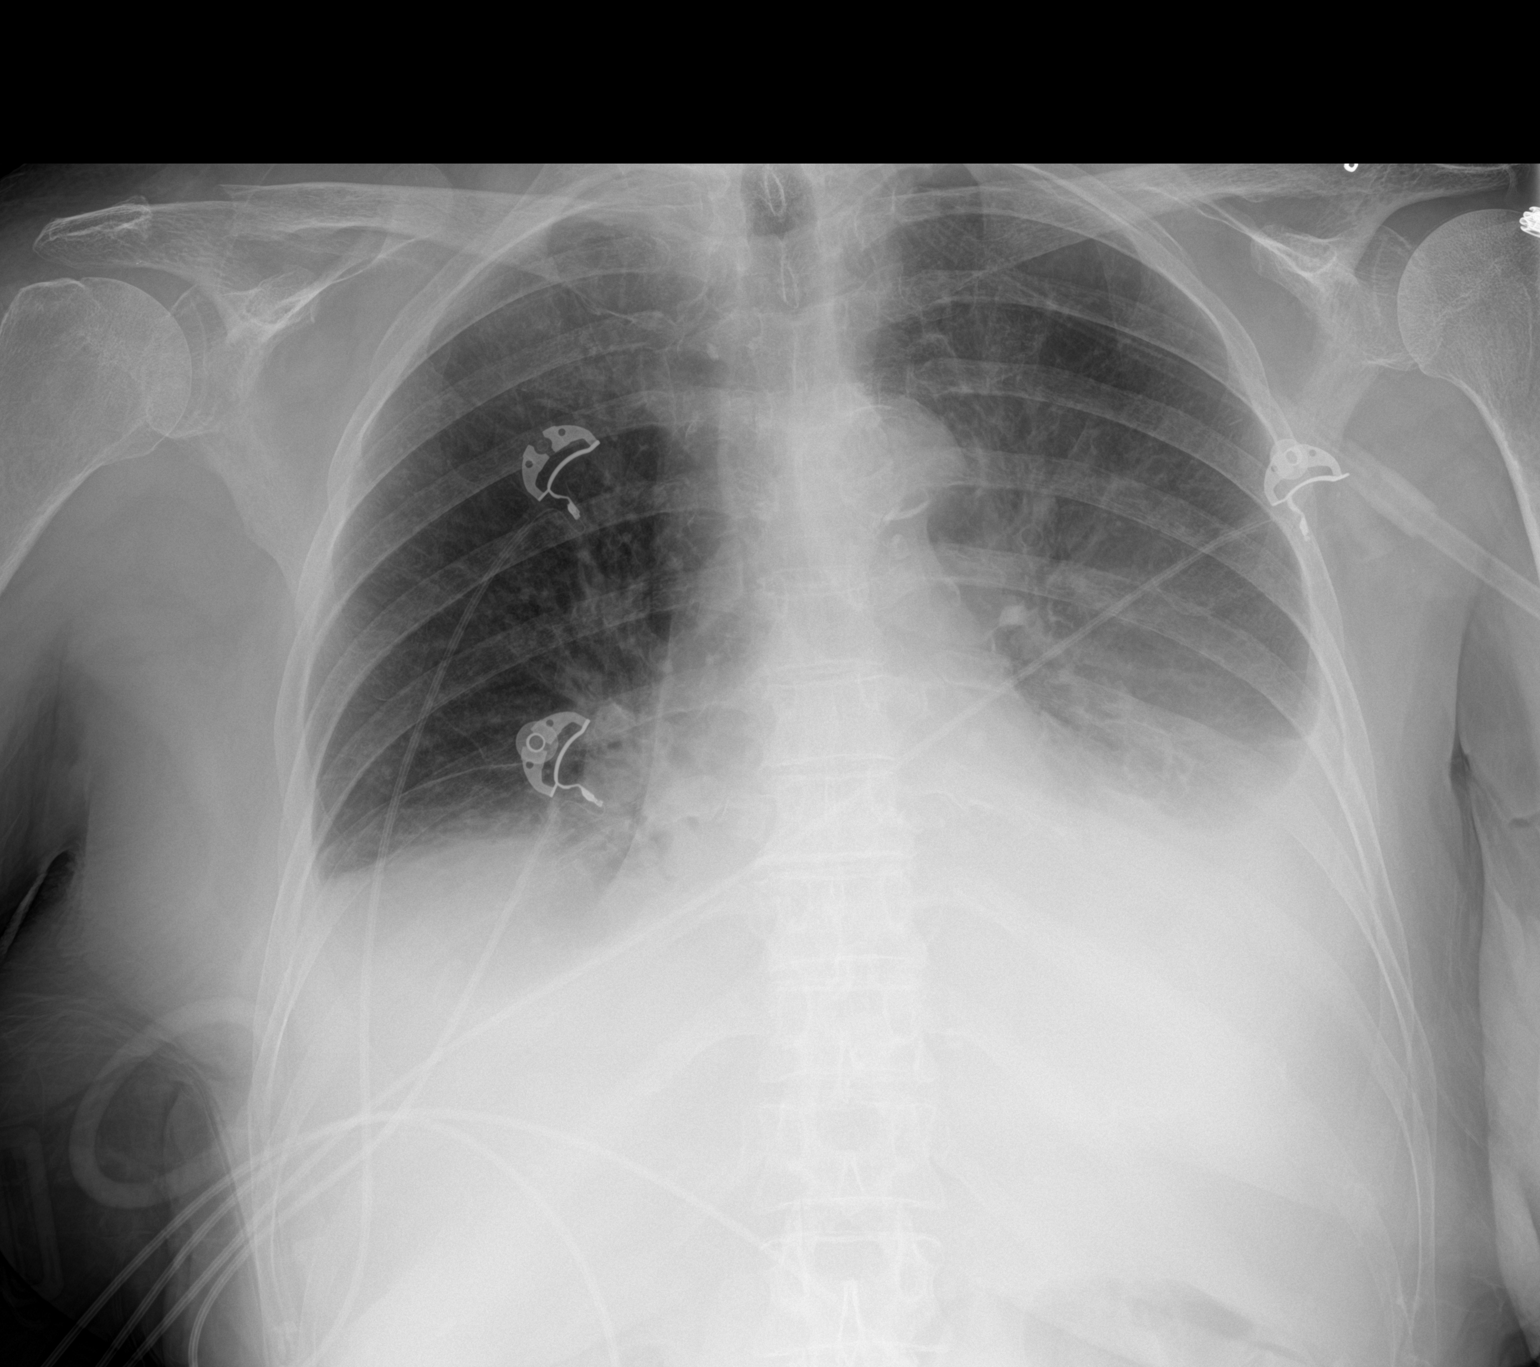

[1 of 1 positions shown; findings below may reference images not displayed]

FINDINGS: Heart size normal. Normal pulmonary vascularity. Left mid and lower
lung infiltrates/edema with associated mild to moderate left pleural
effusion again noted. Mild right basilar atelectasis. No
pneumothorax. No acute bony abnormality. Prior cervical spine
fusion.
IMPRESSION: Left mid and lower lung infiltrates/edema associated mild to
moderate left pleural effusion again noted. Mild right basilar
atelectasis.

## 2021-07-02 ENCOUNTER — Telehealth: Payer: Self-pay | Admitting: Physician Assistant

## 2021-07-02 DIAGNOSIS — D508 Other iron deficiency anemias: Secondary | ICD-10-CM

## 2021-07-02 LAB — METHYLMALONIC ACID, SERUM: Methylmalonic Acid, Quantitative: 410 nmol/L — ABNORMAL HIGH (ref 0–378)

## 2021-07-02 MED ORDER — VITAMIN B-12 1000 MCG PO TABS: 1000.0000 ug | ORAL_TABLET | Freq: Every day | ORAL | 3 refills | Status: AC

## 2021-07-02 MED ORDER — FERROUS SULFATE 325 (65 FE) MG PO TBEC: 325.0000 mg | DELAYED_RELEASE_TABLET | Freq: Every day | ORAL | 3 refills | Status: AC

## 2021-07-02 NOTE — Telephone Encounter (Signed)
Reviewed lab results from 06/27/2021 as there was evidence of normocytic anemia from labs on 06/17/2021.  We checked folate, iron and B12 levels.  There is evidence of B12 and iron deficiency.  I recommend for the patient to start vitamin B12 1000 mcg once daily and ferrous sulfate 325 mg once daily.  I reviewed potential side effects including GI intolerance and constipation with oral iron.  I advised patient to take ferrous sulfate with a source of vitamin C and avoid taking it at the same time as antacids or with dairy products.  Patient denies any signs of bleeding.  Patient notes that her last colonoscopy was over 10 years ago.  I recommend a referral to GI to rule out GI bleed.  I will place a referral to [facility] today.  Patient will return to clinic in 3 months as planned with repeat labs.

## 2021-07-03 DIAGNOSIS — M6281 Muscle weakness (generalized): Secondary | ICD-10-CM | POA: Diagnosis not present

## 2021-07-03 DIAGNOSIS — M25611 Stiffness of right shoulder, not elsewhere classified: Secondary | ICD-10-CM | POA: Diagnosis not present

## 2021-07-03 DIAGNOSIS — M75121 Complete rotator cuff tear or rupture of right shoulder, not specified as traumatic: Secondary | ICD-10-CM | POA: Diagnosis not present

## 2021-07-03 DIAGNOSIS — M25511 Pain in right shoulder: Secondary | ICD-10-CM | POA: Diagnosis not present

## 2021-07-07 ENCOUNTER — Other Ambulatory Visit: Payer: Self-pay

## 2021-07-07 ENCOUNTER — Other Ambulatory Visit: Payer: Medicare PPO | Admitting: *Deleted

## 2021-07-07 DIAGNOSIS — N1832 Chronic kidney disease, stage 3b: Secondary | ICD-10-CM | POA: Diagnosis not present

## 2021-07-07 DIAGNOSIS — R0609 Other forms of dyspnea: Secondary | ICD-10-CM | POA: Diagnosis not present

## 2021-07-07 DIAGNOSIS — I340 Nonrheumatic mitral (valve) insufficiency: Secondary | ICD-10-CM

## 2021-07-08 ENCOUNTER — Telehealth: Payer: Self-pay

## 2021-07-08 DIAGNOSIS — R7989 Other specified abnormal findings of blood chemistry: Secondary | ICD-10-CM

## 2021-07-08 DIAGNOSIS — M654 Radial styloid tenosynovitis [de Quervain]: Secondary | ICD-10-CM | POA: Diagnosis not present

## 2021-07-08 DIAGNOSIS — G5603 Carpal tunnel syndrome, bilateral upper limbs: Secondary | ICD-10-CM | POA: Diagnosis not present

## 2021-07-08 DIAGNOSIS — N1832 Chronic kidney disease, stage 3b: Secondary | ICD-10-CM

## 2021-07-08 LAB — BASIC METABOLIC PANEL
BUN/Creatinine Ratio: 23 (ref 12–28)
BUN: 43 mg/dL — ABNORMAL HIGH (ref 8–27)
CO2: 20 mmol/L (ref 20–29)
Calcium: 9.6 mg/dL (ref 8.7–10.3)
Chloride: 106 mmol/L (ref 96–106)
Creatinine, Ser: 1.84 mg/dL — ABNORMAL HIGH (ref 0.57–1.00)
Glucose: 139 mg/dL — ABNORMAL HIGH (ref 65–99)
Potassium: 4.3 mmol/L (ref 3.5–5.2)
Sodium: 143 mmol/L (ref 134–144)
eGFR: 26 mL/min/{1.73_m2} — ABNORMAL LOW (ref >59)

## 2021-07-08 LAB — MAGNESIUM: Magnesium: 1.9 mg/dL (ref 1.6–2.3)

## 2021-07-08 LAB — PRO B NATRIURETIC PEPTIDE: NT-Pro BNP: 3761 pg/mL — ABNORMAL HIGH (ref 0–738)

## 2021-07-08 MED ORDER — FUROSEMIDE 40 MG PO TABS: 40.0000 mg | ORAL_TABLET | Freq: Two times a day (BID) | ORAL | 3 refills | Status: DC

## 2021-07-08 NOTE — Telephone Encounter (Signed)
-----   Message from Werner Lean, MD sent at 07/08/2021 11:04 AM EDT ----- Results: Stable kidney function and electrolytes Elevated BNP Plan: Increase to lasix 40 mg BID Repeat bmp mg in 10-14 days If no nephrologist, we will refer  Werner Lean, MD

## 2021-07-08 NOTE — Telephone Encounter (Signed)
Reviewed results and MD recommendations with pt.  She is agreeable to plan.  She would like to know if she needs to take potassium d/t increased dose of furosemide. Will route to MD to review.

## 2021-07-09 NOTE — Telephone Encounter (Signed)
Called pt informed her of MD recommendations.  Lab appointment switched to 07/15/21. Pt verbalizes understanding.

## 2021-07-10 ENCOUNTER — Encounter: Payer: Self-pay | Admitting: Gastroenterology

## 2021-07-10 DIAGNOSIS — M25511 Pain in right shoulder: Secondary | ICD-10-CM | POA: Diagnosis not present

## 2021-07-10 DIAGNOSIS — M75121 Complete rotator cuff tear or rupture of right shoulder, not specified as traumatic: Secondary | ICD-10-CM | POA: Diagnosis not present

## 2021-07-10 DIAGNOSIS — M25611 Stiffness of right shoulder, not elsewhere classified: Secondary | ICD-10-CM | POA: Diagnosis not present

## 2021-07-10 DIAGNOSIS — M6281 Muscle weakness (generalized): Secondary | ICD-10-CM | POA: Diagnosis not present

## 2021-07-15 ENCOUNTER — Other Ambulatory Visit: Payer: Medicare PPO | Admitting: *Deleted

## 2021-07-15 ENCOUNTER — Other Ambulatory Visit: Payer: Self-pay

## 2021-07-15 DIAGNOSIS — N1832 Chronic kidney disease, stage 3b: Secondary | ICD-10-CM | POA: Diagnosis not present

## 2021-07-15 DIAGNOSIS — R7989 Other specified abnormal findings of blood chemistry: Secondary | ICD-10-CM | POA: Diagnosis not present

## 2021-07-15 LAB — BASIC METABOLIC PANEL
BUN/Creatinine Ratio: 23 (ref 12–28)
BUN: 49 mg/dL — ABNORMAL HIGH (ref 8–27)
CO2: 22 mmol/L (ref 20–29)
Calcium: 10 mg/dL (ref 8.7–10.3)
Chloride: 102 mmol/L (ref 96–106)
Creatinine, Ser: 2.14 mg/dL — ABNORMAL HIGH (ref 0.57–1.00)
Glucose: 107 mg/dL — ABNORMAL HIGH (ref 65–99)
Potassium: 4.1 mmol/L (ref 3.5–5.2)
Sodium: 141 mmol/L (ref 134–144)
eGFR: 22 mL/min/{1.73_m2} — ABNORMAL LOW (ref >59)

## 2021-07-16 ENCOUNTER — Telehealth: Payer: Self-pay | Admitting: Internal Medicine

## 2021-07-16 DIAGNOSIS — I10 Essential (primary) hypertension: Secondary | ICD-10-CM

## 2021-07-16 DIAGNOSIS — N1832 Chronic kidney disease, stage 3b: Secondary | ICD-10-CM

## 2021-07-16 DIAGNOSIS — R7989 Other specified abnormal findings of blood chemistry: Secondary | ICD-10-CM

## 2021-07-16 NOTE — Telephone Encounter (Signed)
Follow Up:    Patient is returning Shamea's call, concerning her lab results.

## 2021-07-16 NOTE — Telephone Encounter (Signed)
Pt notified of results and MD comments.  Nephrology referral placed on 07/08/21 will f/u on appointment.  Appointment scheduled for f/u lab work 07/31/21.

## 2021-07-16 NOTE — Telephone Encounter (Signed)
-----   Message from Werner Lean, MD sent at 07/15/2021  4:56 PM EDT ----- Results: Increase in creatinine Potassium WNL Plan: Nephrology referral If none Bmp in 2-3 weeks  Werner Lean, MD

## 2021-07-21 ENCOUNTER — Other Ambulatory Visit: Payer: Medicare PPO

## 2021-07-25 ENCOUNTER — Telehealth: Payer: Self-pay | Admitting: Internal Medicine

## 2021-07-25 ENCOUNTER — Other Ambulatory Visit: Payer: Self-pay | Admitting: *Deleted

## 2021-07-25 DIAGNOSIS — R7989 Other specified abnormal findings of blood chemistry: Secondary | ICD-10-CM

## 2021-07-25 DIAGNOSIS — N1832 Chronic kidney disease, stage 3b: Secondary | ICD-10-CM

## 2021-07-25 NOTE — Telephone Encounter (Signed)
pt would like to speak w/ a nurse in regards to her upcoming visit.Marland Kitchen pt states that she was to be referred to a kidney doctor and has yet to hear anything further... please advise.

## 2021-07-25 NOTE — Telephone Encounter (Signed)
Spoke with pt and was wanting to know why keeps getting text to make an appt Reviewed pt's records and pt does have f/u with Dr Gasper Sells in November disregard text and also pt has f/u appt for labs next week and in addition to was asking about nephrology referral Referral reentered and call placed to [facility] per [facility] referral never received for future referrals order and demographics need to be faxed to office in order to process Pt aware of above and phone number given for nephrology office to call in the next couple of weeks if does not hear anything .Adonis Housekeeper

## 2021-07-25 NOTE — Telephone Encounter (Signed)
Lm to call back ./cy 

## 2021-07-27 DIAGNOSIS — I509 Heart failure, unspecified: Secondary | ICD-10-CM | POA: Diagnosis not present

## 2021-07-28 ENCOUNTER — Other Ambulatory Visit (HOSPITAL_COMMUNITY): Payer: Medicare PPO

## 2021-07-31 ENCOUNTER — Other Ambulatory Visit: Payer: Medicare PPO | Admitting: *Deleted

## 2021-07-31 ENCOUNTER — Other Ambulatory Visit: Payer: Self-pay

## 2021-07-31 DIAGNOSIS — I10 Essential (primary) hypertension: Secondary | ICD-10-CM

## 2021-07-31 DIAGNOSIS — R7989 Other specified abnormal findings of blood chemistry: Secondary | ICD-10-CM

## 2021-07-31 DIAGNOSIS — N1832 Chronic kidney disease, stage 3b: Secondary | ICD-10-CM

## 2021-07-31 LAB — BASIC METABOLIC PANEL
BUN/Creatinine Ratio: 27 (ref 12–28)
BUN: 61 mg/dL — ABNORMAL HIGH (ref 8–27)
CO2: 19 mmol/L — ABNORMAL LOW (ref 20–29)
Calcium: 9.4 mg/dL (ref 8.7–10.3)
Chloride: 104 mmol/L (ref 96–106)
Creatinine, Ser: 2.26 mg/dL — ABNORMAL HIGH (ref 0.57–1.00)
Glucose: 103 mg/dL — ABNORMAL HIGH (ref 65–99)
Potassium: 4.4 mmol/L (ref 3.5–5.2)
Sodium: 141 mmol/L (ref 134–144)
eGFR: 21 mL/min/{1.73_m2} — ABNORMAL LOW (ref >59)

## 2021-08-01 ENCOUNTER — Telehealth: Payer: Self-pay | Admitting: Internal Medicine

## 2021-08-01 NOTE — Telephone Encounter (Signed)
Pt c/o medication issue:  1. Name of Medication:  furosemide (LASIX) 40 MG tablet  2. How are you currently taking this medication (dosage and times per day)? Twice daily   3. Are you having a reaction (difficulty breathing--STAT)? No   4. What is your medication issue? Wanting to know if she should still continue to take twice a day

## 2021-08-01 NOTE — Telephone Encounter (Signed)
Called pt reviewed results and MD recommendations.  Pt is aware that she should continue furosemide until visit with nephrology.  Per pt appointment is 09/04/21.  I will route results to PCP and Dr. Joylene Grapes with [facility].

## 2021-08-04 DIAGNOSIS — E782 Mixed hyperlipidemia: Secondary | ICD-10-CM | POA: Diagnosis not present

## 2021-08-05 DIAGNOSIS — N19 Unspecified kidney failure: Secondary | ICD-10-CM | POA: Diagnosis not present

## 2021-08-05 DIAGNOSIS — E860 Dehydration: Secondary | ICD-10-CM | POA: Diagnosis not present

## 2021-08-05 DIAGNOSIS — D692 Other nonthrombocytopenic purpura: Secondary | ICD-10-CM | POA: Diagnosis not present

## 2021-08-05 DIAGNOSIS — N184 Chronic kidney disease, stage 4 (severe): Secondary | ICD-10-CM | POA: Diagnosis not present

## 2021-08-05 DIAGNOSIS — J90 Pleural effusion, not elsewhere classified: Secondary | ICD-10-CM | POA: Diagnosis not present

## 2021-08-11 DIAGNOSIS — J90 Pleural effusion, not elsewhere classified: Secondary | ICD-10-CM | POA: Diagnosis not present

## 2021-08-11 DIAGNOSIS — N19 Unspecified kidney failure: Secondary | ICD-10-CM | POA: Diagnosis not present

## 2021-08-11 DIAGNOSIS — N184 Chronic kidney disease, stage 4 (severe): Secondary | ICD-10-CM | POA: Diagnosis not present

## 2021-08-11 DIAGNOSIS — E860 Dehydration: Secondary | ICD-10-CM | POA: Diagnosis not present

## 2021-08-12 ENCOUNTER — Encounter: Payer: Self-pay | Admitting: Gastroenterology

## 2021-08-12 ENCOUNTER — Ambulatory Visit: Payer: Medicare PPO | Admitting: Gastroenterology

## 2021-08-12 VITALS — BP 112/60 | HR 65 | Ht 63.0 in | Wt 130.0 lb

## 2021-08-12 DIAGNOSIS — D508 Other iron deficiency anemias: Secondary | ICD-10-CM | POA: Diagnosis not present

## 2021-08-12 DIAGNOSIS — Z7901 Long term (current) use of anticoagulants: Secondary | ICD-10-CM | POA: Diagnosis not present

## 2021-08-12 DIAGNOSIS — N1831 Chronic kidney disease, stage 3a: Secondary | ICD-10-CM | POA: Diagnosis not present

## 2021-08-12 DIAGNOSIS — D649 Anemia, unspecified: Secondary | ICD-10-CM | POA: Insufficient documentation

## 2021-08-12 NOTE — Progress Notes (Signed)
08/12/2021 Jillian Cooley [id number] 05/29/1932   HISTORY OF PRESENT ILLNESS: This is an 87 year old female who is new to our office.  She has been referred here by Dede Query, PA-C, for evaluation regarding iron deficiency anemia.  Patient's recent hemoglobin was found to be 10.4 g back in June.  I do not have anything else for comparison, but she tells me that she was never told that she was anemic in the past.  Ferritin and TIBC are normal, but serum iron and iron saturation are low at 33 and 13% respectively.  She has been started on ferrous sulfate 325 mg daily.  She says that she has never seen blood in her stools and her stools have never been black.  She says that now they are darker because of the iron supplement, but no issues prior to starting that.  Stools have not been checked for microscopic blood.  She had shoulder surgery in May and that was followed by DVT/pulmonary embolism so she is now on Eliquis.  She has also had issues with pleural effusions and some ankle edema/swelling.  They are trying to get all of that under control.  She also recently had some worsening of her chronic kidney disease and is now in stage III chronic kidney disease.  Her last colonoscopy was over 10 years ago with Dr. Cristina Gong and it was normal at that time per their report.    Past Medical History:  Diagnosis Date   Abnormal EKG    Carpal tunnel syndrome    CKD (chronic kidney disease), stage III (HCC)    DDD (degenerative disc disease), lumbar    Hypertension    Hypertension with renal disease    Menopause    Mixed hyperlipidemia    Muscular degeneration    Nonrheumatic aortic (valve) insufficiency    Nonrheumatic mitral valve regurgitation    Osteoarthritis    Pedal edema    Pleural effusion    Skin cancer    Vitamin D deficiency    Past Surgical History:  Procedure Laterality Date   ANTERIOR CERVICAL DECOMP/DISCECTOMY FUSION  2009   CARPAL TUNNEL RELEASE Bilateral    IR  THORACENTESIS ASP PLEURAL SPACE W/IMG GUIDE  04/25/2021   LUMBAR LAMINECTOMY/DECOMPRESSION MICRODISCECTOMY  06/13/2012   Procedure: LUMBAR LAMINECTOMY/DECOMPRESSION MICRODISCECTOMY 1 LEVEL;  Surgeon: Ophelia Charter, MD;  Location: [facility];  Service: Neurosurgery;  Laterality: Right;  RIGHT Lumbar two-three diskectomy   SHOULDER ARTHROSCOPY WITH ROTATOR CUFF REPAIR AND SUBACROMIAL DECOMPRESSION Right 04/23/2021   Procedure: SHOULDER ARTHROSCOPY WITH ROTATOR CUFF REPAIR AND SUBACROMIAL DECOMPRESSION;  Surgeon: Earlie Server, MD;  Location: [place];  Service: Orthopedics;  Laterality: Right;    reports that she has never smoked. She has never used smokeless tobacco. She reports that she does not drink alcohol and does not use drugs. family history includes Colon cancer in her maternal grandfather. Allergies  Allergen Reactions   Sulfa Antibiotics Nausea Only   Sulfamethoxazole-Trimethoprim Nausea Only      Outpatient Encounter Medications as of 08/12/2021  Medication Sig   amLODipine (NORVASC) 5 MG tablet Take 5 mg by mouth in the morning.   apixaban (ELIQUIS) 5 MG TABS tablet Take 1 tablet (5 mg total) by mouth 2 (two) times daily.   Calcium Carb-Cholecalciferol (CALTRATE 600+D3 PO) Take 1 tablet by mouth daily with breakfast.   ferrous sulfate 325 (65 FE) MG EC tablet Take 1 tablet (325 mg total) by mouth daily with breakfast.  furosemide (LASIX) 40 MG tablet Take 20 mg by mouth. Twice a day 2 days a week   furosemide (LASIX) 40 MG tablet Take 1 tablet (40 mg total) by mouth 2 (two) times daily. (Patient taking differently: Take 40 mg by mouth 2 (two) times daily. Twice a day five days a week)   HYDROcodone-acetaminophen (NORCO/VICODIN) 5-325 MG tablet Take 1 tablet by mouth every 4 (four) hours as needed for moderate pain (may need 1-2 first few days followin surgery max 8 tabs in 24hrs).   losartan (COZAAR) 25 MG tablet Take 25 mg by mouth in the morning.   Multiple  Vitamins-Minerals (PRESERVISION AREDS 2+MULTI VIT PO) Take 1 tablet by mouth 2 (two) times daily.   simvastatin (ZOCOR) 20 MG tablet Take 20 mg by mouth every evening.   vitamin B-12 (CYANOCOBALAMIN) 1000 MCG tablet Take 1 tablet (1,000 mcg total) by mouth daily.   No facility-administered encounter medications on file as of 08/12/2021.     REVIEW OF SYSTEMS  : All other systems reviewed and negative except where noted in the History of Present Illness.   PHYSICAL EXAM: BP 112/60   Pulse 65   Ht 5\' 3"  (1.6 m)   Wt 130 lb (59 kg)   BMI 23.03 kg/m  General: Well developed white female in no acute distress Head: Normocephalic and atraumatic Eyes:  Sclerae anicteric, conjunctiva pink. Ears: Normal auditory acuity Lungs: Clear throughout to auscultation; no W/R/R. Heart: Regular rate and rhythm; no M/R/G. Abdomen: Soft, non-distended.  BS present.  Non-tender. Musculoskeletal: Symmetrical with no gross deformities  Skin: No lesions on visible extremities Extremities:  Swelling noted in both ankles L>R. Neurological: Alert oriented x 4, grossly non-focal Psychological:  Alert and cooperative. Normal mood and affect  ASSESSMENT AND PLAN: *Iron deficiency anemia: Hemoglobin 10.4 g in June.  I do not have anything else for comparison, but she reports she was never told that she was anemic in the past.  Ferritin was normal and TIBC was normal, but serum iron and iron saturation levels were low at 33 and 13% respectively.  She has been started on ferrous sulfate 325 mg daily.  She denies any evidence of GI bleeding with dark or bloody stools.  Colonoscopy was greater than 10 years ago and was normal per her report.  She is currently on Eliquis for pulmonary embolisms that were discovered in May after shoulder surgery.  She has been dealing with some issues of ankle swelling and pleural effusions.  She has also had some worsening of her chronic kidney disease recently and is now at stage III  chronic kidney disease.  Stools have not been checked for microscopic blood.  I think at this point with her advanced age and all of her other recent medical problems that it would be wise to proceed with observation for now.  I think that her anemia could certainly be from her worsening chronic kidney disease.  We will plan to see her back in November at which time they may plan to take her off of the Eliquis and that will give [place] time to assess how well she responds to the iron supplements and also to try to better control her other recent medical problems.  At that point we can determine if we think any procedures are necessary.  CC:  [name], MD

## 2021-08-12 NOTE — Patient Instructions (Addendum)
It was my pleasure to provide care to you today. Based on our discussion, I am providing you with my recommendations below:  RECOMMENDATION(S):   I would like for you to follow up with our office in 3 months. Please refer to your appointments included within this After Visit Summary.  BMI:  If you are age 85 or older, your body mass index should be between 23-30. Your There is no height or weight on file to calculate BMI. If this is out of the aforementioned range listed, please consider follow up with your Primary Care Provider.  MY CHART:  The [facility] providers would like to encourage you to use [facility] to communicate with providers for non-urgent requests or questions.  Due to long hold times on the telephone, sending your provider a message by [facility] may be a faster and more efficient way to get a response.  Please allow 48 business hours for a response.  Please remember that this is for non-urgent requests.   Thank you for trusting me with your gastrointestinal care!    Alonza Bogus PA-C

## 2021-08-14 NOTE — Progress Notes (Signed)
Agree with the assessment and plan as outlined by Alonza Bogus, PA-C.  Given patient's recent PE and Eliquis requirement in setting of worsening renal disease, observation seems reasonable in this elderly patient with multiple comorbidities.  Should the anemia worsen, then endoscopic examination will need to be reconsidered.  Bryna Razavi E. Candis Schatz, MD  [facility]

## 2021-08-27 DIAGNOSIS — I509 Heart failure, unspecified: Secondary | ICD-10-CM | POA: Diagnosis not present

## 2021-09-02 ENCOUNTER — Encounter: Payer: Self-pay | Admitting: Pulmonary Disease

## 2021-09-02 ENCOUNTER — Other Ambulatory Visit: Payer: Self-pay

## 2021-09-02 ENCOUNTER — Ambulatory Visit: Payer: Medicare PPO | Admitting: Pulmonary Disease

## 2021-09-02 ENCOUNTER — Ambulatory Visit (INDEPENDENT_AMBULATORY_CARE_PROVIDER_SITE_OTHER): Payer: Medicare PPO

## 2021-09-02 VITALS — BP 136/92 | HR 105 | Temp 97.8°F | Ht 63.0 in | Wt 125.2 lb

## 2021-09-02 DIAGNOSIS — R0600 Dyspnea, unspecified: Secondary | ICD-10-CM

## 2021-09-02 DIAGNOSIS — J90 Pleural effusion, not elsewhere classified: Secondary | ICD-10-CM | POA: Diagnosis not present

## 2021-09-02 DIAGNOSIS — R0609 Other forms of dyspnea: Secondary | ICD-10-CM

## 2021-09-02 NOTE — Progress Notes (Signed)
Patient ID: Jillian Cooley, female    DOB: July 04, 1932, 87 y.o.   MRN: [id number]  Chief Complaint  Patient presents with   Follow-up    Pt states SOB    Referring provider: [name], MD  HPI:   87 year old woman whom we are seeing in follow up for evaluation of pleural effusion and dyspnea on exertion.  Most recent cardiology note x2 reviewed.  Breathing has been stable but worsened over the last few weeks.  More dyspnea on exertion.  A bit more fatigue.  Feels similar to when pleural effusion has enlarged in the past.  Seen by PCP recently with concern for pleural effusion based on exam.  Recommended see me for further evaluation.  In the interim, she was in the hospital 04/2021 for rotator cuff repair.  She developed pulmonary embolus and continues on Eliquis since discovery.  Diagnosed 04/25/2021.  Chest x-ray at that time reviewed and reveals reaccumulation of left pleural effusion on my interpretation.  Breathing had improved prior.  She is on intermittent doses of Lasix.  Was at 1 point, 60 mg daily but sounds like creatinine was unstable on this.  Currently taking varying doses of 40 mg twice daily versus 20 mg twice daily.  She will like a lower extremity swelling is still pretty bad.  Worse today than usual.  HPI at initial visit:  Patient noted gradual onset of dyspnea on exertion over the last several weeks. This is associated with lower extremity swelling. She endorses mainly dyspnea going up inclines or stairs but can occur on flat surfaces. Also notes difficulty lying flat, sleeping more upright. Severity is described as severe. No alleviating or exacerbating factors otherwise. No timing during the day when things are better or worse. No seasonal variation that she can tell. No environmental factors that she can identify the mechanics better or worse. No medicines have been changed, no real intervention prior to seeing me in terms of trying to improve her  symptoms.  Work-up included chest x-ray December 30, 2020 that personally reviewed and on my interpretation demonstrates bilateral pleural effusions with left larger than right. Additional work-up including echocardiogram. This was reviewed. This demonstrates aortic and mitral valve insufficiency with dilated LA.  PMH: Hypertension Surgical history: Cervical and lumbar the surgery Family history: She denies any significant history of respiratory or cardiac issues in first-degree relatives Social history: Lives in [place], married, never smoker  Licensed conveyancer / Pulmonary Flowsheets:   ACT:  No flowsheet data found.  MMRC: mMRC Dyspnea Scale mMRC Score  09/02/2021 3  01/30/2021 1    Epworth:  No flowsheet data found.  Tests:   FENO:  No results found for: NITRICOXIDE  PFT: No flowsheet data found.  WALK:  No flowsheet data found.  Imaging: Personally reviewed and as per EMR discussion of this note  Lab Results: Personally reviewed CBC    Component Value Date/Time   WBC 7.8 06/17/2021 1518   WBC 5.3 06/08/2012 1001   RBC 3.46 (L) 06/27/2021 1249   RBC 3.55 (L) 06/17/2021 1518   HGB 10.4 (L) 06/17/2021 1518   HCT 32.0 (L) 06/17/2021 1518   PLT 243 06/17/2021 1518   MCV 90.1 06/17/2021 1518   MCH 29.3 06/17/2021 1518   MCHC 32.5 06/17/2021 1518   RDW 13.3 06/17/2021 1518   LYMPHSABS 1.7 06/17/2021 1518   MONOABS 0.7 06/17/2021 1518   EOSABS 0.1 06/17/2021 1518   BASOSABS 0.0 06/17/2021 1518    BMET  Component Value Date/Time   NA 141 07/31/2021 1104   K 4.4 07/31/2021 1104   CL 104 07/31/2021 1104   CO2 19 (L) 07/31/2021 1104   GLUCOSE 103 (H) 07/31/2021 1104   GLUCOSE 109 (H) 06/17/2021 1518   BUN 61 (H) 07/31/2021 1104   CREATININE 2.26 (H) 07/31/2021 1104   CREATININE 1.79 (H) 06/17/2021 1518   CALCIUM 9.4 07/31/2021 1104   GFRNONAA 27 (L) 06/17/2021 1518   GFRAA 46 (L) 06/08/2012 1001    BNP    Component Value Date/Time   BNP 528.9 (H)  04/24/2021 0920    ProBNP    Component Value Date/Time   PROBNP 3,761 (H) 07/07/2021 1500    Specialty Problems   None  Allergies  Allergen Reactions   Sulfa Antibiotics Nausea Only   Sulfamethoxazole-Trimethoprim Nausea Only    Immunization History  Administered Date(s) Administered   Influenza-Unspecified 10/21/2018   PFIZER(Purple Top)SARS-COV-2 Vaccination 01/09/2020, 01/29/2020, 10/01/2020    Past Medical History:  Diagnosis Date   Abnormal EKG    Carpal tunnel syndrome    CKD (chronic kidney disease), stage III (HCC)    DDD (degenerative disc disease), lumbar    Hypertension    Hypertension with renal disease    Menopause    Mixed hyperlipidemia    Muscular degeneration    Nonrheumatic aortic (valve) insufficiency    Nonrheumatic mitral valve regurgitation    Osteoarthritis    Pedal edema    Pleural effusion    Skin cancer    Vitamin D deficiency     Tobacco History: Social History   Tobacco Use  Smoking Status Never  Smokeless Tobacco Never   Counseling given: Not Answered   Continue to not smoke  Outpatient Encounter Medications as of 09/02/2021  Medication Sig   amLODipine (NORVASC) 5 MG tablet Take 5 mg by mouth in the morning.   apixaban (ELIQUIS) 5 MG TABS tablet Take 1 tablet (5 mg total) by mouth 2 (two) times daily.   Calcium Carb-Cholecalciferol (CALTRATE 600+D3 PO) Take 1 tablet by mouth daily with breakfast.   ferrous sulfate 325 (65 FE) MG EC tablet Take 1 tablet (325 mg total) by mouth daily with breakfast.   furosemide (LASIX) 40 MG tablet Take 20 mg by mouth. Twice a day 2 days a week   furosemide (LASIX) 40 MG tablet Take 1 tablet (40 mg total) by mouth 2 (two) times daily. (Patient taking differently: Take 40 mg by mouth 2 (two) times daily. Twice a day five days a week)   HYDROcodone-acetaminophen (NORCO/VICODIN) 5-325 MG tablet Take 1 tablet by mouth every 4 (four) hours as needed for moderate pain (may need 1-2 first few days  followin surgery max 8 tabs in 24hrs).   losartan (COZAAR) 25 MG tablet Take 25 mg by mouth in the morning.   Multiple Vitamins-Minerals (PRESERVISION AREDS 2+MULTI VIT PO) Take 1 tablet by mouth 2 (two) times daily.   simvastatin (ZOCOR) 20 MG tablet Take 20 mg by mouth every evening.   vitamin B-12 (CYANOCOBALAMIN) 1000 MCG tablet Take 1 tablet (1,000 mcg total) by mouth daily.   No facility-administered encounter medications on file as of 09/02/2021.     Review of Systems  Review of Systems  N/a Physical Exam  BP (!) 136/92 (BP Location: Right Arm, Patient Position: Sitting, Cuff Size: Normal)   Pulse (!) 105   Temp 97.8 F (36.6 C) (Oral)   Ht 5\' 3"  (1.6 m)   Wt 125 lb 3.2 oz (  56.8 kg)   SpO2 96%   BMI 22.18 kg/m   Wt Readings from Last 5 Encounters:  09/02/21 125 lb 3.2 oz (56.8 kg)  08/12/21 130 lb (59 kg)  06/26/21 135 lb 12.8 oz (61.6 kg)  06/17/21 137 lb 6.4 oz (62.3 kg)  04/25/21 134 lb 11.2 oz (61.1 kg)    BMI Readings from Last 5 Encounters:  09/02/21 22.18 kg/m  08/12/21 23.03 kg/m  06/26/21 24.06 kg/m  06/17/21 24.34 kg/m  04/25/21 23.86 kg/m     Physical Exam General: Well-appearing, no acute distress Eyes: EOMI, icterus Respiratory: Diminished right base, absent to midlung field on the left Cardiovascular: Regular rhythm, no murmurs noted Abdomen: Nondistended, bowel sounds present MSK: No synovitis, no joint effusion Neuro: Normal gait, no weakness Psych: No mood, full affect   Assessment & Plan:   Dyspnea on exertion: Related to pleural effusion likely with recent decompensation.  Chronic contributors include valvular insufficiency and likely chronic volume overload.  Pleural effusions: Left greater than right.  Transudative in past and likely related to cardiogenic volume overload.  Lasix dosing has been challenging given her CKD.  Repeat chest x-ray today.  Tentative plan for thoracentesis next week pending chest x-ray  findings.   Return if symptoms worsen or fail to improve.   Lanier Clam, MD 09/02/2021

## 2021-09-02 NOTE — Patient Instructions (Signed)
Nice to see you again  We will do a chest x-ray today.  I agree I think the fluid is returned on the left.  As long as his chest x-ray is consistent with fluid, we will plan to do a thoracentesis on Monday at [facility].  We will be in contact in the coming days with further details.

## 2021-09-02 NOTE — H&P (View-Only) (Signed)
Patient ID: Jillian Cooley, female    DOB: April 15, 1932, 87 y.o.   MRN: [id number]  Chief Complaint  Patient presents with   Follow-up    Pt states SOB    Referring provider: [name], MD  HPI:   87 year old woman whom we are seeing in follow up for evaluation of pleural effusion and dyspnea on exertion.  Most recent cardiology note x2 reviewed.  Breathing has been stable but worsened over the last few weeks.  More dyspnea on exertion.  A bit more fatigue.  Feels similar to when pleural effusion has enlarged in the past.  Seen by PCP recently with concern for pleural effusion based on exam.  Recommended see me for further evaluation.  In the interim, she was in the hospital 04/2021 for rotator cuff repair.  She developed pulmonary embolus and continues on Eliquis since discovery.  Diagnosed 04/25/2021.  Chest x-ray at that time reviewed and reveals reaccumulation of left pleural effusion on my interpretation.  Breathing had improved prior.  She is on intermittent doses of Lasix.  Was at 1 point, 60 mg daily but sounds like creatinine was unstable on this.  Currently taking varying doses of 40 mg twice daily versus 20 mg twice daily.  She will like a lower extremity swelling is still pretty bad.  Worse today than usual.  HPI at initial visit:  Patient noted gradual onset of dyspnea on exertion over the last several weeks. This is associated with lower extremity swelling. She endorses mainly dyspnea going up inclines or stairs but can occur on flat surfaces. Also notes difficulty lying flat, sleeping more upright. Severity is described as severe. No alleviating or exacerbating factors otherwise. No timing during the day when things are better or worse. No seasonal variation that she can tell. No environmental factors that she can identify the mechanics better or worse. No medicines have been changed, no real intervention prior to seeing me in terms of trying to improve her  symptoms.  Work-up included chest x-ray December 30, 2020 that personally reviewed and on my interpretation demonstrates bilateral pleural effusions with left larger than right. Additional work-up including echocardiogram. This was reviewed. This demonstrates aortic and mitral valve insufficiency with dilated LA.  PMH: Hypertension Surgical history: Cervical and lumbar the surgery Family history: She denies any significant history of respiratory or cardiac issues in first-degree relatives Social history: Lives in [place], married, never smoker  Licensed conveyancer / Pulmonary Flowsheets:   ACT:  No flowsheet data found.  MMRC: mMRC Dyspnea Scale mMRC Score  09/02/2021 3  01/30/2021 1    Epworth:  No flowsheet data found.  Tests:   FENO:  No results found for: NITRICOXIDE  PFT: No flowsheet data found.  WALK:  No flowsheet data found.  Imaging: Personally reviewed and as per EMR discussion of this note  Lab Results: Personally reviewed CBC    Component Value Date/Time   WBC 7.8 06/17/2021 1518   WBC 5.3 06/08/2012 1001   RBC 3.46 (L) 06/27/2021 1249   RBC 3.55 (L) 06/17/2021 1518   HGB 10.4 (L) 06/17/2021 1518   HCT 32.0 (L) 06/17/2021 1518   PLT 243 06/17/2021 1518   MCV 90.1 06/17/2021 1518   MCH 29.3 06/17/2021 1518   MCHC 32.5 06/17/2021 1518   RDW 13.3 06/17/2021 1518   LYMPHSABS 1.7 06/17/2021 1518   MONOABS 0.7 06/17/2021 1518   EOSABS 0.1 06/17/2021 1518   BASOSABS 0.0 06/17/2021 1518    BMET  Component Value Date/Time   NA 141 07/31/2021 1104   K 4.4 07/31/2021 1104   CL 104 07/31/2021 1104   CO2 19 (L) 07/31/2021 1104   GLUCOSE 103 (H) 07/31/2021 1104   GLUCOSE 109 (H) 06/17/2021 1518   BUN 61 (H) 07/31/2021 1104   CREATININE 2.26 (H) 07/31/2021 1104   CREATININE 1.79 (H) 06/17/2021 1518   CALCIUM 9.4 07/31/2021 1104   GFRNONAA 27 (L) 06/17/2021 1518   GFRAA 46 (L) 06/08/2012 1001    BNP    Component Value Date/Time   BNP 528.9 (H)  04/24/2021 0920    ProBNP    Component Value Date/Time   PROBNP 3,761 (H) 07/07/2021 1500    Specialty Problems   None  Allergies  Allergen Reactions   Sulfa Antibiotics Nausea Only   Sulfamethoxazole-Trimethoprim Nausea Only    Immunization History  Administered Date(s) Administered   Influenza-Unspecified 10/21/2018   PFIZER(Purple Top)SARS-COV-2 Vaccination 01/09/2020, 01/29/2020, 10/01/2020    Past Medical History:  Diagnosis Date   Abnormal EKG    Carpal tunnel syndrome    CKD (chronic kidney disease), stage III (HCC)    DDD (degenerative disc disease), lumbar    Hypertension    Hypertension with renal disease    Menopause    Mixed hyperlipidemia    Muscular degeneration    Nonrheumatic aortic (valve) insufficiency    Nonrheumatic mitral valve regurgitation    Osteoarthritis    Pedal edema    Pleural effusion    Skin cancer    Vitamin D deficiency     Tobacco History: Social History   Tobacco Use  Smoking Status Never  Smokeless Tobacco Never   Counseling given: Not Answered   Continue to not smoke  Outpatient Encounter Medications as of 09/02/2021  Medication Sig   amLODipine (NORVASC) 5 MG tablet Take 5 mg by mouth in the morning.   apixaban (ELIQUIS) 5 MG TABS tablet Take 1 tablet (5 mg total) by mouth 2 (two) times daily.   Calcium Carb-Cholecalciferol (CALTRATE 600+D3 PO) Take 1 tablet by mouth daily with breakfast.   ferrous sulfate 325 (65 FE) MG EC tablet Take 1 tablet (325 mg total) by mouth daily with breakfast.   furosemide (LASIX) 40 MG tablet Take 20 mg by mouth. Twice a day 2 days a week   furosemide (LASIX) 40 MG tablet Take 1 tablet (40 mg total) by mouth 2 (two) times daily. (Patient taking differently: Take 40 mg by mouth 2 (two) times daily. Twice a day five days a week)   HYDROcodone-acetaminophen (NORCO/VICODIN) 5-325 MG tablet Take 1 tablet by mouth every 4 (four) hours as needed for moderate pain (may need 1-2 first few days  followin surgery max 8 tabs in 24hrs).   losartan (COZAAR) 25 MG tablet Take 25 mg by mouth in the morning.   Multiple Vitamins-Minerals (PRESERVISION AREDS 2+MULTI VIT PO) Take 1 tablet by mouth 2 (two) times daily.   simvastatin (ZOCOR) 20 MG tablet Take 20 mg by mouth every evening.   vitamin B-12 (CYANOCOBALAMIN) 1000 MCG tablet Take 1 tablet (1,000 mcg total) by mouth daily.   No facility-administered encounter medications on file as of 09/02/2021.     Review of Systems  Review of Systems  N/a Physical Exam  BP (!) 136/92 (BP Location: Right Arm, Patient Position: Sitting, Cuff Size: Normal)   Pulse (!) 105   Temp 97.8 F (36.6 C) (Oral)   Ht 5\' 3"  (1.6 m)   Wt 125 lb 3.2 oz (  56.8 kg)   SpO2 96%   BMI 22.18 kg/m   Wt Readings from Last 5 Encounters:  09/02/21 125 lb 3.2 oz (56.8 kg)  08/12/21 130 lb (59 kg)  06/26/21 135 lb 12.8 oz (61.6 kg)  06/17/21 137 lb 6.4 oz (62.3 kg)  04/25/21 134 lb 11.2 oz (61.1 kg)    BMI Readings from Last 5 Encounters:  09/02/21 22.18 kg/m  08/12/21 23.03 kg/m  06/26/21 24.06 kg/m  06/17/21 24.34 kg/m  04/25/21 23.86 kg/m     Physical Exam General: Well-appearing, no acute distress Eyes: EOMI, icterus Respiratory: Diminished right base, absent to midlung field on the left Cardiovascular: Regular rhythm, no murmurs noted Abdomen: Nondistended, bowel sounds present MSK: No synovitis, no joint effusion Neuro: Normal gait, no weakness Psych: No mood, full affect   Assessment & Plan:   Dyspnea on exertion: Related to pleural effusion likely with recent decompensation.  Chronic contributors include valvular insufficiency and likely chronic volume overload.  Pleural effusions: Left greater than right.  Transudative in past and likely related to cardiogenic volume overload.  Lasix dosing has been challenging given her CKD.  Repeat chest x-ray today.  Tentative plan for thoracentesis next week pending chest x-ray  findings.   Return if symptoms worsen or fail to improve.   Lanier Clam, MD 09/02/2021

## 2021-09-04 DIAGNOSIS — R8271 Bacteriuria: Secondary | ICD-10-CM | POA: Diagnosis not present

## 2021-09-04 DIAGNOSIS — R600 Localized edema: Secondary | ICD-10-CM | POA: Diagnosis not present

## 2021-09-04 DIAGNOSIS — I129 Hypertensive chronic kidney disease with stage 1 through stage 4 chronic kidney disease, or unspecified chronic kidney disease: Secondary | ICD-10-CM | POA: Diagnosis not present

## 2021-09-04 DIAGNOSIS — N184 Chronic kidney disease, stage 4 (severe): Secondary | ICD-10-CM | POA: Diagnosis not present

## 2021-09-04 DIAGNOSIS — N1832 Chronic kidney disease, stage 3b: Secondary | ICD-10-CM | POA: Diagnosis not present

## 2021-09-04 DIAGNOSIS — N390 Urinary tract infection, site not specified: Secondary | ICD-10-CM | POA: Diagnosis not present

## 2021-09-04 DIAGNOSIS — I351 Nonrheumatic aortic (valve) insufficiency: Secondary | ICD-10-CM | POA: Diagnosis not present

## 2021-09-04 NOTE — H&P (View-Only) (Signed)
Please schedule thoracentesis for Wednesday 9/21 in the afternoon at [facility] endoscopy. When confirmed, please instruct her to take last dose of blood thinner Sunday night and to hold all doses until after the procedure.

## 2021-09-04 NOTE — Progress Notes (Signed)
Please schedule thoracentesis for Wednesday 9/21 in the afternoon at [facility] endoscopy. When confirmed, please instruct her to take last dose of blood thinner Sunday night and to hold all doses until after the procedure.

## 2021-09-05 ENCOUNTER — Telehealth: Payer: Self-pay | Admitting: Pulmonary Disease

## 2021-09-05 NOTE — Telephone Encounter (Signed)
Call made to patient, confirmed DOB. Made aware of CXR results per MH and made aware we are trying to get her Thoracentesis scheduled for next week. Voiced understanding.   Will route message to procedure pool to schedule Thoracentesis.

## 2021-09-05 NOTE — Telephone Encounter (Signed)
Please arrange thoracentesis at [facility] Wednesday 9/21 in the afternoon. Last dose of blood thinner Sunday night and to hold until after procedure.

## 2021-09-08 ENCOUNTER — Other Ambulatory Visit: Payer: Self-pay | Admitting: Internal Medicine

## 2021-09-08 DIAGNOSIS — N1832 Chronic kidney disease, stage 3b: Secondary | ICD-10-CM

## 2021-09-08 NOTE — Telephone Encounter (Signed)
Called and spoke with patient. Let them know their thoracentesis is scheduled for 09/10/2021 with Dr. Silas Flood at 2:00 pm.  Patient was instructed to arrive at hospital at 1:30 pm. They were instructed to bring someone with them as they will not be able to drive home from procedure. Patient instructed not to have anything to eat or drink after midnight. Patient needs to hold their blood thinner Eliquis , 2 days prior to procedure. Last dose of blood thinner Sunday night and to hold until after procedure.    Patient voiced understanding, nothing further needed  Routing to Dr. Silas Flood as Juluis Rainier

## 2021-09-09 DIAGNOSIS — G5603 Carpal tunnel syndrome, bilateral upper limbs: Secondary | ICD-10-CM | POA: Diagnosis not present

## 2021-09-09 DIAGNOSIS — N184 Chronic kidney disease, stage 4 (severe): Secondary | ICD-10-CM | POA: Diagnosis not present

## 2021-09-09 DIAGNOSIS — J90 Pleural effusion, not elsewhere classified: Secondary | ICD-10-CM | POA: Diagnosis not present

## 2021-09-09 DIAGNOSIS — M654 Radial styloid tenosynovitis [de Quervain]: Secondary | ICD-10-CM | POA: Diagnosis not present

## 2021-09-10 ENCOUNTER — Ambulatory Visit (HOSPITAL_COMMUNITY)
Admission: RE | Admit: 2021-09-10 | Discharge: 2021-09-10 | Disposition: A | Discharge status: Home / Self Care | Payer: Medicare PPO | Attending: Pulmonary Disease | Admitting: Pulmonary Disease

## 2021-09-10 ENCOUNTER — Ambulatory Visit (HOSPITAL_COMMUNITY): Payer: Medicare PPO

## 2021-09-10 ENCOUNTER — Encounter (HOSPITAL_COMMUNITY): Payer: Self-pay | Admitting: Pulmonary Disease

## 2021-09-10 ENCOUNTER — Encounter (HOSPITAL_COMMUNITY)
Admission: RE | Disposition: A | Discharge status: Home / Self Care | Payer: Self-pay | Source: Home / Self Care | Attending: Pulmonary Disease

## 2021-09-10 DIAGNOSIS — Z888 Allergy status to other drugs, medicaments and biological substances status: Secondary | ICD-10-CM | POA: Diagnosis not present

## 2021-09-10 DIAGNOSIS — I129 Hypertensive chronic kidney disease with stage 1 through stage 4 chronic kidney disease, or unspecified chronic kidney disease: Secondary | ICD-10-CM | POA: Insufficient documentation

## 2021-09-10 DIAGNOSIS — Z79899 Other long term (current) drug therapy: Secondary | ICD-10-CM | POA: Diagnosis not present

## 2021-09-10 DIAGNOSIS — J9811 Atelectasis: Secondary | ICD-10-CM | POA: Diagnosis not present

## 2021-09-10 DIAGNOSIS — J90 Pleural effusion, not elsewhere classified: Secondary | ICD-10-CM | POA: Insufficient documentation

## 2021-09-10 DIAGNOSIS — N183 Chronic kidney disease, stage 3 unspecified: Secondary | ICD-10-CM | POA: Diagnosis not present

## 2021-09-10 DIAGNOSIS — Z882 Allergy status to sulfonamides status: Secondary | ICD-10-CM | POA: Insufficient documentation

## 2021-09-10 DIAGNOSIS — I080 Rheumatic disorders of both mitral and aortic valves: Secondary | ICD-10-CM | POA: Insufficient documentation

## 2021-09-10 DIAGNOSIS — Z86711 Personal history of pulmonary embolism: Secondary | ICD-10-CM | POA: Diagnosis not present

## 2021-09-10 DIAGNOSIS — Z7901 Long term (current) use of anticoagulants: Secondary | ICD-10-CM | POA: Diagnosis not present

## 2021-09-10 HISTORY — PX: THORACENTESIS: SHX235

## 2021-09-10 LAB — BODY FLUID CELL COUNT WITH DIFFERENTIAL
Eos, Fluid: 0 %
Lymphs, Fluid: 92 %
Monocyte-Macrophage-Serous Fluid: 5 % — ABNORMAL LOW (ref 50–90)
Neutrophil Count, Fluid: 3 % (ref 0–25)
Total Nucleated Cell Count, Fluid: 640 cu mm (ref 0–1000)

## 2021-09-10 LAB — ALBUMIN, PLEURAL OR PERITONEAL FLUID: Albumin, Fluid: 2.1 g/dL

## 2021-09-10 SURGERY — THORACENTESIS
Anesthesia: LOCAL

## 2021-09-10 NOTE — Interval H&P Note (Deleted)
History and Physical Interval Note:  09/10/2021 1:22 PM  Jillian Cooley  has presented today for surgery, with the diagnosis of pleural effusion.  The various methods of treatment have been discussed with the patient and family. After consideration of risks, benefits and other options for treatment, the patient has consented to  Procedure(s): THORACENTESIS (N/A) as a surgical intervention.  [name] history has been reviewed, patient examined, no change in status, stable for surgery.  I have reviewed the patient's chart and labs.  Questions were answered to the patient's satisfaction.     Bonna Gains Rober Skeels

## 2021-09-10 NOTE — Procedures (Addendum)
Thoracentesis  Procedure Note  Jillian Cooley  [id number]  1932/07/09  Date:09/10/21  Time:2:45 PM   Provider Performing:Leiani Enright Cleon Dew NP and Dr. Silas Flood, MD  Procedure: Thoracentesis with imaging guidance [phone])  Indication(s) Pleural Effusion  Consent Risks of the procedure as well as the alternatives and risks of each were explained to the patient and/or caregiver.  Consent for the procedure was obtained and is signed in the bedside chart  Anesthesia Topical only with 1% lidocaine    Time Out Verified patient identification, verified procedure, site/side was marked, verified correct patient position, special equipment/implants available, medications/allergies/relevant history reviewed, required imaging and test results available.   Sterile Technique Maximal sterile technique including full sterile barrier drape, hand hygiene, sterile gown, sterile gloves, mask, hair covering, sterile ultrasound probe cover (if used).  Procedure Description Ultrasound was used to identify appropriate pleural anatomy for placement and overlying skin marked.  Area of drainage cleaned and draped in sterile fashion. Lidocaine was used to anesthetize the skin and subcutaneous tissue.  1400 cc's of straw appearing fluid was drained from the left pleural space. Catheter then removed and bandaid applied to site.     Complications/Tolerance None; patient tolerated the procedure well. Chest X-ray is ordered to confirm no post-procedural complication.   EBL Minimal   Specimen(s) Pleural fluid  [employer]., MSN, APRN, AGACNP-BC [facility]  09/10/2021 , 2:46 PM  Please see [email or web address] for pager details  If no response, please call [phone] After hours, please call Elink at [phone]

## 2021-09-11 LAB — CYTOLOGY - NON PAP

## 2021-09-12 ENCOUNTER — Other Ambulatory Visit: Payer: Self-pay | Admitting: Physician Assistant

## 2021-09-12 ENCOUNTER — Ambulatory Visit
Admission: RE | Admit: 2021-09-12 | Discharge: 2021-09-12 | Disposition: A | Discharge status: Home / Self Care | Payer: Medicare PPO | Source: Ambulatory Visit | Attending: Internal Medicine | Admitting: Internal Medicine

## 2021-09-12 DIAGNOSIS — N1832 Chronic kidney disease, stage 3b: Secondary | ICD-10-CM

## 2021-09-12 DIAGNOSIS — D472 Monoclonal gammopathy: Secondary | ICD-10-CM

## 2021-09-12 LAB — PROTEIN, BODY FLUID (OTHER): Total Protein, Body Fluid Other: 3.3 g/dL

## 2021-09-12 NOTE — Progress Notes (Unsigned)
Received records from [facility]. 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 Additional immunofixation revealed free kappa ligh chain specificity. Serum free light chain was elevated at 4504.5 and kapp/lambda ratio was 228.65. Creatinine was 1.95, Calcium 10.0, Hgb 11.2.   Patient is scheduled for a follow up with me on 09/16/2021. Reviewed records with [name]. Recommendation is bone marrow biopsy and complete monoclonal gammopathy workup including  repeat CBC, CMP. Additionally, we will order beta-2 glycoprotein, LDH, UPEP, DG bone met survey.   I have left voicemail for patient to call me back to review plan.

## 2021-09-15 NOTE — Interval H&P Note (Signed)
History and Physical Interval Note:  09/15/2021 1:04 PM  Jillian Cooley  has presented today for surgery, with the diagnosis of pleural effusion.  The various methods of treatment have been discussed with the patient and family. After consideration of risks, benefits and other options for treatment, the patient has consented to  Procedure(s): THORACENTESIS (N/A) as a surgical intervention.  [name] history has been reviewed, patient examined, no change in status, stable for surgery.  I have reviewed the patient's chart and labs.  Questions were answered to the patient's satisfaction.     Bonna Gains Doxie Augenstein

## 2021-09-16 ENCOUNTER — Inpatient Hospital Stay: Payer: Medicare PPO | Attending: Physician Assistant

## 2021-09-16 ENCOUNTER — Other Ambulatory Visit: Payer: Self-pay | Admitting: Physician Assistant

## 2021-09-16 ENCOUNTER — Inpatient Hospital Stay: Payer: Medicare PPO | Admitting: Physician Assistant

## 2021-09-16 ENCOUNTER — Other Ambulatory Visit: Payer: Self-pay

## 2021-09-16 DIAGNOSIS — Z86711 Personal history of pulmonary embolism: Secondary | ICD-10-CM | POA: Insufficient documentation

## 2021-09-16 DIAGNOSIS — Z79899 Other long term (current) drug therapy: Secondary | ICD-10-CM | POA: Insufficient documentation

## 2021-09-16 DIAGNOSIS — D649 Anemia, unspecified: Secondary | ICD-10-CM | POA: Diagnosis not present

## 2021-09-16 DIAGNOSIS — Z85828 Personal history of other malignant neoplasm of skin: Secondary | ICD-10-CM | POA: Diagnosis not present

## 2021-09-16 DIAGNOSIS — Z7901 Long term (current) use of anticoagulants: Secondary | ICD-10-CM | POA: Insufficient documentation

## 2021-09-16 DIAGNOSIS — R768 Other specified abnormal immunological findings in serum: Secondary | ICD-10-CM | POA: Insufficient documentation

## 2021-09-16 DIAGNOSIS — M7989 Other specified soft tissue disorders: Secondary | ICD-10-CM | POA: Diagnosis not present

## 2021-09-16 DIAGNOSIS — Z800 Family history of malignant neoplasm of digestive organs: Secondary | ICD-10-CM | POA: Diagnosis not present

## 2021-09-16 DIAGNOSIS — R5383 Other fatigue: Secondary | ICD-10-CM | POA: Diagnosis not present

## 2021-09-16 DIAGNOSIS — J90 Pleural effusion, not elsewhere classified: Secondary | ICD-10-CM | POA: Insufficient documentation

## 2021-09-16 DIAGNOSIS — Z86718 Personal history of other venous thrombosis and embolism: Secondary | ICD-10-CM | POA: Diagnosis not present

## 2021-09-16 DIAGNOSIS — I351 Nonrheumatic aortic (valve) insufficiency: Secondary | ICD-10-CM | POA: Insufficient documentation

## 2021-09-16 DIAGNOSIS — N1832 Chronic kidney disease, stage 3b: Secondary | ICD-10-CM | POA: Diagnosis not present

## 2021-09-16 DIAGNOSIS — K5900 Constipation, unspecified: Secondary | ICD-10-CM | POA: Insufficient documentation

## 2021-09-16 DIAGNOSIS — Z882 Allergy status to sulfonamides status: Secondary | ICD-10-CM | POA: Insufficient documentation

## 2021-09-16 DIAGNOSIS — D509 Iron deficiency anemia, unspecified: Secondary | ICD-10-CM | POA: Insufficient documentation

## 2021-09-16 DIAGNOSIS — M545 Low back pain, unspecified: Secondary | ICD-10-CM | POA: Insufficient documentation

## 2021-09-16 DIAGNOSIS — E538 Deficiency of other specified B group vitamins: Secondary | ICD-10-CM | POA: Insufficient documentation

## 2021-09-16 DIAGNOSIS — I129 Hypertensive chronic kidney disease with stage 1 through stage 4 chronic kidney disease, or unspecified chronic kidney disease: Secondary | ICD-10-CM | POA: Insufficient documentation

## 2021-09-16 DIAGNOSIS — G8929 Other chronic pain: Secondary | ICD-10-CM | POA: Insufficient documentation

## 2021-09-16 DIAGNOSIS — D472 Monoclonal gammopathy: Secondary | ICD-10-CM

## 2021-09-16 DIAGNOSIS — I824Z3 Acute embolism and thrombosis of unspecified deep veins of distal lower extremity, bilateral: Secondary | ICD-10-CM | POA: Insufficient documentation

## 2021-09-16 LAB — CBC WITH DIFFERENTIAL (CANCER CENTER ONLY)
Abs Immature Granulocytes: 0.02 10*3/uL (ref 0.00–0.07)
Basophils Absolute: 0 10*3/uL (ref 0.0–0.1)
Basophils Relative: 0 %
Eosinophils Absolute: 0.1 10*3/uL (ref 0.0–0.5)
Eosinophils Relative: 2 %
HCT: 32 % — ABNORMAL LOW (ref 36.0–46.0)
Hemoglobin: 10.4 g/dL — ABNORMAL LOW (ref 12.0–15.0)
Immature Granulocytes: 0 %
Lymphocytes Relative: 22 %
Lymphs Abs: 1.4 10*3/uL (ref 0.7–4.0)
MCH: 29.1 pg (ref 26.0–34.0)
MCHC: 32.5 g/dL (ref 30.0–36.0)
MCV: 89.4 fL (ref 80.0–100.0)
Monocytes Absolute: 0.5 10*3/uL (ref 0.1–1.0)
Monocytes Relative: 8 %
Neutro Abs: 4.3 10*3/uL (ref 1.7–7.7)
Neutrophils Relative %: 68 %
Platelet Count: 220 10*3/uL (ref 150–400)
RBC: 3.58 MIL/uL — ABNORMAL LOW (ref 3.87–5.11)
RDW: 13.8 % (ref 11.5–15.5)
WBC Count: 6.3 10*3/uL (ref 4.0–10.5)
nRBC: 0 % (ref 0.0–0.2)

## 2021-09-16 LAB — CMP (CANCER CENTER ONLY)
ALT: 13 U/L (ref 0–44)
AST: 17 U/L (ref 15–41)
Albumin: 3.5 g/dL (ref 3.5–5.0)
Alkaline Phosphatase: 53 U/L (ref 38–126)
Anion gap: 14 (ref 5–15)
BUN: 70 mg/dL — ABNORMAL HIGH (ref 8–23)
CO2: 21 mmol/L — ABNORMAL LOW (ref 22–32)
Calcium: 10.2 mg/dL (ref 8.9–10.3)
Chloride: 103 mmol/L (ref 98–111)
Creatinine: 2.29 mg/dL — ABNORMAL HIGH (ref 0.44–1.00)
GFR, Estimated: 20 mL/min — ABNORMAL LOW (ref >60)
Glucose, Bld: 123 mg/dL — ABNORMAL HIGH (ref 70–99)
Potassium: 4.2 mmol/L (ref 3.5–5.1)
Sodium: 138 mmol/L (ref 135–145)
Total Bilirubin: 0.8 mg/dL (ref 0.3–1.2)
Total Protein: 6.5 g/dL (ref 6.5–8.1)

## 2021-09-16 LAB — LACTATE DEHYDROGENASE: LDH: 177 U/L (ref 98–192)

## 2021-09-17 DIAGNOSIS — D692 Other nonthrombocytopenic purpura: Secondary | ICD-10-CM | POA: Diagnosis not present

## 2021-09-17 DIAGNOSIS — Z23 Encounter for immunization: Secondary | ICD-10-CM | POA: Diagnosis not present

## 2021-09-17 DIAGNOSIS — I129 Hypertensive chronic kidney disease with stage 1 through stage 4 chronic kidney disease, or unspecified chronic kidney disease: Secondary | ICD-10-CM | POA: Diagnosis not present

## 2021-09-17 DIAGNOSIS — N184 Chronic kidney disease, stage 4 (severe): Secondary | ICD-10-CM | POA: Diagnosis not present

## 2021-09-17 DIAGNOSIS — D696 Thrombocytopenia, unspecified: Secondary | ICD-10-CM | POA: Diagnosis not present

## 2021-09-17 DIAGNOSIS — E782 Mixed hyperlipidemia: Secondary | ICD-10-CM | POA: Diagnosis not present

## 2021-09-17 LAB — BETA 2 MICROGLOBULIN, SERUM: Beta-2 Microglobulin: 7.8 mg/L — ABNORMAL HIGH (ref 0.6–2.4)

## 2021-09-17 NOTE — Progress Notes (Addendum)
[place] [phone]) [phone]   Fax:([phone]) [place] NOTE  Patient Care Team: [name], MD as PCP - General (Internal Medicine) Werner Lean, MD as PCP - Cardiology (Cardiology) Werner Lean, MD as Consulting Physician (Cardiology)  Hematological/Oncological History #Bilateral DVT and PE: 1) 04/23/2021-04/26/2021: Admitted for right rotator cuff repair and found to have acute hypoxia.  Chest xray found small left pleural effusion with associated atelectasis and trace right pleural effusion and right basilar airspace disease. Underwent left thoracentesis with removal of 800 cc. Due to persistent hypoxia, patient underwent NM pulmonary perfusion study that confirmed bilateral pulmonary emboli. Doppler US revealed bilateral acute DVT involving the right soleal veins and left posterior tibial veins. Patient was started on Eliquis.  2)  06/17/2021: Establish care with Dede Query PA-C and recommend Eliquis x 6 months.   #Normocytic Anemia: 1) 06/17/2021: Labs show anemia with Hgb 10.4, MCV 90.1 2) 06/27/2021: Labs show iron deficiency with serum iron 33, iron saturation 13%; vitamin B12 deficiency with vitamin B12 at 266 and elevated MMA at 410. Folate level normal. Recommended oral ferrous sulfate 325 mg once daily and vitamin b12 1000 mcg once daily.   #MGUS: 1) 09/04/2021: Labs from [facility] -TIBC 228 (L), Iron 64, Iron saturation 28%, Ferritin 216 (H), Creatinine 1.96 (H), BUN 51 (H), Calcium 10.0, -CBC: Hgb 11.2,  -SPEP revealed M-Spike 0.3 with immunofixation that showed monoclonal free kappa light chain.  -sFLC: Free Kappa Lt Chain: 4504.5 (H), Free Lambda Lt Chain: 19.7, Kappa/Lambda Ratio: 228.65 (H)   HISTORY OF PRESENTING ILLNESS:  Jillian Cooley 87 y.o. female returns for a follow up for history of DVT/PE, iron deficiency anemia, vitamin B12 deficiency. Her last clinic visit was on 06/17/2021.  In the interim, patient was evaluated by [facility] for progressive kidney disease. Workup from 09/04/2021 revealed monoclonal protein  with elevated kappa/lambda ratio  On exam today, Ms. [name] reports that her energy levels are overall stable. She continues to have ongoing fatigue but continues to complete her ADLs on her own.  Her appetite is fair and she notes loosing approximately 5lbs over the last 3 months. She denies nausea, vomiting or abdominal pain. She has intermittent episodes of constipation due to iron supplementation. She denies easy bruising or signs of bleeding. She has chronic back low back pain. She denies any new palpable masses. She denies fevers, chills, night sweats, shortness of breath, chest pain, cough. She has no other complaints. Full 10 point ROS is below.   MEDICAL HISTORY:  Past Medical History:  Diagnosis Date   Abnormal EKG    Carpal tunnel syndrome    CKD (chronic kidney disease), stage III (HCC)    DDD (degenerative disc disease), lumbar    Hypertension    Hypertension with renal disease    Menopause    Mixed hyperlipidemia    Muscular degeneration    Nonrheumatic aortic (valve) insufficiency    Nonrheumatic mitral valve regurgitation    Osteoarthritis    Pedal edema    Pleural effusion    Skin cancer    Vitamin D deficiency     SURGICAL HISTORY: Past Surgical History:  Procedure Laterality Date   ANTERIOR CERVICAL DECOMP/DISCECTOMY FUSION  2009   CARPAL TUNNEL RELEASE Bilateral    IR THORACENTESIS ASP PLEURAL SPACE W/IMG GUIDE  04/25/2021   LUMBAR LAMINECTOMY/DECOMPRESSION MICRODISCECTOMY  06/13/2012   Procedure: LUMBAR LAMINECTOMY/DECOMPRESSION MICRODISCECTOMY 1 LEVEL;  Surgeon: Ophelia Charter, MD;  Location[facility]  NEURO ORS;  Service: Neurosurgery;  Laterality: Right;  RIGHT Lumbar two-three diskectomy   SHOULDER ARTHROSCOPY WITH ROTATOR CUFF REPAIR AND SUBACROMIAL DECOMPRESSION Right 04/23/2021   Procedure: SHOULDER ARTHROSCOPY  WITH ROTATOR CUFF REPAIR AND SUBACROMIAL DECOMPRESSION;  Surgeon: Earlie Server, MD;  Location: [place];  Service: Orthopedics;  Laterality: Right;   THORACENTESIS N/A 09/10/2021   Procedure: Mathews Robinsons;  Surgeon: Lanier Clam, MD;  Location: [facility] ENDOSCOPY;  Service: Pulmonary;  Laterality: N/A;    SOCIAL HISTORY: Social History   Socioeconomic History   Marital status: Married    Spouse name: Not on file   Number of children: Not on file   Years of education: Not on file   Highest education level: Not on file  Occupational History   Not on file  Tobacco Use   Smoking status: Never   Smokeless tobacco: Never  Substance and Sexual Activity   Alcohol use: No   Drug use: No   Sexual activity: Not on file  Other Topics Concern   Not on file  Social History Narrative   Not on file   Social Determinants of Health   Financial Resource Strain: Not on file  Food Insecurity: Not on file  Transportation Needs: Not on file  Physical Activity: Not on file  Stress: Not on file  Social Connections: Not on file  Intimate Partner Violence: Not on file    FAMILY HISTORY: Family History  Problem Relation Age of Onset   Colon cancer Maternal Grandfather    Breast cancer Neg Hx     ALLERGIES:  is allergic to sulfa antibiotics.  MEDICATIONS:  Current Outpatient Medications  Medication Sig Dispense Refill   amLODipine (NORVASC) 5 MG tablet Take 5 mg by mouth in the morning.     apixaban (ELIQUIS) 5 MG TABS tablet Take 1 tablet (5 mg total) by mouth 2 (two) times daily. 60 tablet 3   Calcium Carb-Cholecalciferol (CALTRATE 600+D3 PO) Take 1 tablet by mouth daily with breakfast.     ferrous sulfate 325 (65 FE) MG EC tablet Take 1 tablet (325 mg total) by mouth daily with breakfast. 30 tablet 3   furosemide (LASIX) 40 MG tablet Take 1 tablet (40 mg total) by mouth 2 (two) times daily. (Patient taking differently: Take 20-40 mg by mouth See admin instructions.  Take 20 mg twice daily on Tues and Thurs, Take 40 mg twice daily on Sun, Mon, Wed, Fri, and Sat) 90 tablet 3   HYDROcodone-acetaminophen (NORCO/VICODIN) 5-325 MG tablet Take 1 tablet by mouth every 4 (four) hours as needed for moderate pain (may need 1-2 first few days followin surgery max 8 tabs in 24hrs). 40 tablet 0   losartan (COZAAR) 25 MG tablet Take 25 mg by mouth in the morning.     Multiple Vitamins-Minerals (PRESERVISION AREDS 2+MULTI VIT PO) Take 1 tablet by mouth 2 (two) times daily.     simvastatin (ZOCOR) 20 MG tablet Take 20 mg by mouth every evening.     vitamin B-12 (CYANOCOBALAMIN) 1000 MCG tablet Take 1 tablet (1,000 mcg total) by mouth daily. 30 tablet 3   No current facility-administered medications for this visit.    REVIEW OF SYSTEMS:   Constitutional: ( - ) fevers, ( - )  chills , ( - ) night sweats Eyes: ( - ) blurriness of vision, ( - ) double vision, ( - ) watery eyes Ears, nose, mouth, throat, and face: ( - ) mucositis, ( - ) sore throat Respiratory: ( - )  cough, ( - ) dyspnea, ( - ) wheezes Cardiovascular: ( - ) palpitation, ( - ) chest discomfort, ( +) lower extremity swelling Gastrointestinal:  ( - ) nausea, ( - ) heartburn, ( - ) change in bowel habits Skin: ( - ) abnormal skin rashes Lymphatics: ( - ) new lymphadenopathy, ( - ) easy bruising Neurological: ( - ) numbness, ( - ) tingling, ( - ) new weaknesses Behavioral/Psych: ( - ) mood change, ( - ) new changes  All other systems were reviewed with the patient and are negative.  PHYSICAL EXAMINATION: ECOG PERFORMANCE STATUS: 1 - Symptomatic but completely ambulatory  Vitals:   09/16/21 1438  BP: 114/63  Pulse: (!) 110  Resp: 17  Temp: 98.5 F (36.9 C)  SpO2: 98%   Filed Weights   09/16/21 1438  Weight: 126 lb 8 oz (57.4 kg)    GENERAL: well appearing female in NAD  SKIN: skin color, texture, turgor are normal, no rashes or significant lesions EYES: conjunctiva are pink and non-injected,  sclera clear OROPHARYNX: no exudate, no erythema; lips, buccal mucosa, and tongue normal  LYMPH:  no palpable lymphadenopathy in the cervical, axillary or supraclavicular lymph nodes.  LUNGS: clear to auscultation and percussion with normal breathing effort HEART: regular rate & rhythm and no murmurs. Bilateral lower extremity edema involving the ankles and feet.  ABDOMEN: soft, non-tender, non-distended, normal bowel sounds Musculoskeletal: no cyanosis of digits and no clubbing  PSYCH: alert & oriented x 3, fluent speech NEURO: no focal motor/sensory deficits  LABORATORY DATA:  I have reviewed the data as listed CBC Latest Ref Rng & Units 09/16/2021 06/17/2021 06/08/2012  WBC 4.0 - 10.5 K/uL 6.3 7.8 5.3  Hemoglobin 12.0 - 15.0 g/dL 10.4(L) 10.4(L) 12.7  Hematocrit 36.0 - 46.0 % 32.0(L) 32.0(L) 36.8  Platelets 150 - 400 K/uL 220 243 163    CMP Latest Ref Rng & Units 09/16/2021 07/31/2021 07/15/2021  Glucose 70 - 99 mg/dL 123(H) 103(H) 107(H)  BUN 8 - 23 mg/dL 70(H) 61(H) 49(H)  Creatinine 0.44 - 1.00 mg/dL 2.29(H) 2.26(H) 2.14(H)  Sodium 135 - 145 mmol/L 138 141 141  Potassium 3.5 - 5.1 mmol/L 4.2 4.4 4.1  Chloride 98 - 111 mmol/L 103 104 102  CO2 22 - 32 mmol/L 21(L) 19(L) 22  Calcium 8.9 - 10.3 mg/dL 10.2 9.4 10.0  Total Protein 6.5 - 8.1 g/dL 6.5 - -  Total Bilirubin 0.3 - 1.2 mg/dL 0.8 - -  Alkaline Phos 38 - 126 U/L 53 - -  AST 15 - 41 U/L 17 - -  ALT 0 - 44 U/L 13 - -     ASSESSMENT & PLAN Arizbeth Cawthorn Killmer is a 87 y.o. female presenting to the clinic for normocytic anemia and MGUS.   #Hx of bilateral DVT and PE: --Diagnosed on 04/25/2021 following recent orthopedic surgery on 04/23/2021, which is likely the provoking cause. --Currently on Eliquis 5 mg BID. Recommend to continue for a total of six months (around November 2022) --Labs from 06/17/2021 ruled out antiphospholipid syndrome.   #Normocytic anemia: --Multifactorial with CKD, iron deficiency and vitamin B12  deficiency.  --Labs from 06/27/2021 showed vitamin B12 level of 266, methylmalonic acid elevated 410, iron saturation 13%, serum iron 33, ferritin 130. --Recommended to start ferrous sulfate 325 mg once daily and vitamin B12 1000 mcg once daily. Recommend to continue.  --Repeat labs form 09/04/2021 (outside labs) show improvement of iron panel with iron 64, Iron saturation 28%, Ferritin 216.  #MGUS: --  Outside SPEP from 09/04/2021  revealed M-Spike 0.3 with immunofixation that showed monoclonal free kappa light chain. Serum FLC: Free Kappa Lt Chain: 4504.5 (H), Free Lambda Lt Chain: 19.7, Kappa/Lambda Ratio: 228.65 (H) --Need aditional laboratory evaluation today with beta-2 microglobulin, LDH, UPEP. Repeat CBC and CMP.  --Need DG bone met survey to evaluate for lytic lesions.  --Elevated serum free light chain ratio with worsening renal function meets criteria for bone marrow biopsy, currently schedule next week for 09/24/2021.   Follow up: --RTC in 2 weeks with labs.   No orders of the defined types were placed in this encounter.   All questions were answered. The patient knows to call the clinic with any problems, questions or concerns.  I have spent a total of 35 minutes minutes of face-to-face and non-face-to-face time, preparing to see the patient, obtaining and/or reviewing separately obtained history, performing a medically appropriate examination, counseling and educating the patient, ordering tests/procedures, documenting clinical information in the electronic health record, and care coordination.   Dede Query, PA-C Department of Hematology/Oncology [place] [facility] Home Phone: [phone]

## 2021-09-18 ENCOUNTER — Other Ambulatory Visit: Payer: Self-pay | Admitting: *Deleted

## 2021-09-18 DIAGNOSIS — J90 Pleural effusion, not elsewhere classified: Secondary | ICD-10-CM | POA: Diagnosis not present

## 2021-09-18 DIAGNOSIS — R5383 Other fatigue: Secondary | ICD-10-CM | POA: Diagnosis not present

## 2021-09-18 DIAGNOSIS — M545 Low back pain, unspecified: Secondary | ICD-10-CM | POA: Diagnosis not present

## 2021-09-18 DIAGNOSIS — D472 Monoclonal gammopathy: Secondary | ICD-10-CM | POA: Insufficient documentation

## 2021-09-18 DIAGNOSIS — R768 Other specified abnormal immunological findings in serum: Secondary | ICD-10-CM | POA: Diagnosis not present

## 2021-09-18 DIAGNOSIS — E538 Deficiency of other specified B group vitamins: Secondary | ICD-10-CM | POA: Diagnosis not present

## 2021-09-18 DIAGNOSIS — D509 Iron deficiency anemia, unspecified: Secondary | ICD-10-CM | POA: Diagnosis not present

## 2021-09-18 DIAGNOSIS — I824Z3 Acute embolism and thrombosis of unspecified deep veins of distal lower extremity, bilateral: Secondary | ICD-10-CM | POA: Diagnosis not present

## 2021-09-18 DIAGNOSIS — D649 Anemia, unspecified: Secondary | ICD-10-CM | POA: Insufficient documentation

## 2021-09-18 DIAGNOSIS — K5900 Constipation, unspecified: Secondary | ICD-10-CM | POA: Diagnosis not present

## 2021-09-22 LAB — UPEP/UIFE/LIGHT CHAINS/TP, 24-HR UR
% BETA, Urine: 10.4 %
ALPHA 1 URINE: 0.4 %
Albumin, U: 13.3 %
Alpha 2, Urine: 3.4 %
Free Kappa Lt Chains,Ur: 1853.71 mg/L — ABNORMAL HIGH (ref 1.17–86.46)
Free Kappa/Lambda Ratio: 534.21 — ABNORMAL HIGH (ref 1.83–14.26)
Free Lambda Lt Chains,Ur: 3.47 mg/L (ref 0.27–15.21)
GAMMA GLOBULIN URINE: 72.5 %
M-SPIKE %, Urine: 38.2 % — ABNORMAL HIGH
M-Spike, Mg/24 Hr: 118 mg/24 hr — ABNORMAL HIGH
Total Protein, Urine-Ur/day: 309 mg/24 hr — ABNORMAL HIGH (ref 30–150)
Total Protein, Urine: 28.1 mg/dL
Total Volume: 1100

## 2021-09-24 ENCOUNTER — Inpatient Hospital Stay (HOSPITAL_BASED_OUTPATIENT_CLINIC_OR_DEPARTMENT_OTHER): Payer: Medicare PPO | Admitting: Adult Health

## 2021-09-24 ENCOUNTER — Inpatient Hospital Stay: Payer: Medicare PPO | Attending: Physician Assistant

## 2021-09-24 ENCOUNTER — Other Ambulatory Visit: Payer: Self-pay

## 2021-09-24 VITALS — BP 120/73 | HR 102 | Resp 17

## 2021-09-24 DIAGNOSIS — N1832 Chronic kidney disease, stage 3b: Secondary | ICD-10-CM | POA: Diagnosis not present

## 2021-09-24 DIAGNOSIS — Z7901 Long term (current) use of anticoagulants: Secondary | ICD-10-CM | POA: Diagnosis not present

## 2021-09-24 DIAGNOSIS — Z86718 Personal history of other venous thrombosis and embolism: Secondary | ICD-10-CM | POA: Diagnosis not present

## 2021-09-24 DIAGNOSIS — H353114 Nonexudative age-related macular degeneration, right eye, advanced atrophic with subfoveal involvement: Secondary | ICD-10-CM | POA: Diagnosis not present

## 2021-09-24 DIAGNOSIS — H35363 Drusen (degenerative) of macula, bilateral: Secondary | ICD-10-CM | POA: Diagnosis not present

## 2021-09-24 DIAGNOSIS — H26491 Other secondary cataract, right eye: Secondary | ICD-10-CM | POA: Diagnosis not present

## 2021-09-24 DIAGNOSIS — D472 Monoclonal gammopathy: Secondary | ICD-10-CM

## 2021-09-24 DIAGNOSIS — H353122 Nonexudative age-related macular degeneration, left eye, intermediate dry stage: Secondary | ICD-10-CM | POA: Diagnosis not present

## 2021-09-24 DIAGNOSIS — C9000 Multiple myeloma not having achieved remission: Secondary | ICD-10-CM | POA: Insufficient documentation

## 2021-09-24 DIAGNOSIS — Z79899 Other long term (current) drug therapy: Secondary | ICD-10-CM | POA: Insufficient documentation

## 2021-09-24 DIAGNOSIS — D649 Anemia, unspecified: Secondary | ICD-10-CM

## 2021-09-24 DIAGNOSIS — H35453 Secondary pigmentary degeneration, bilateral: Secondary | ICD-10-CM | POA: Diagnosis not present

## 2021-09-24 DIAGNOSIS — H53411 Scotoma involving central area, right eye: Secondary | ICD-10-CM | POA: Diagnosis not present

## 2021-09-24 DIAGNOSIS — I129 Hypertensive chronic kidney disease with stage 1 through stage 4 chronic kidney disease, or unspecified chronic kidney disease: Secondary | ICD-10-CM | POA: Diagnosis not present

## 2021-09-24 LAB — CBC WITH DIFFERENTIAL (CANCER CENTER ONLY)
Abs Immature Granulocytes: 0.03 10*3/uL (ref 0.00–0.07)
Basophils Absolute: 0 10*3/uL (ref 0.0–0.1)
Basophils Relative: 0 %
Eosinophils Absolute: 0.1 10*3/uL (ref 0.0–0.5)
Eosinophils Relative: 1 %
HCT: 30.9 % — ABNORMAL LOW (ref 36.0–46.0)
Hemoglobin: 10.2 g/dL — ABNORMAL LOW (ref 12.0–15.0)
Immature Granulocytes: 0 %
Lymphocytes Relative: 12 %
Lymphs Abs: 0.8 10*3/uL (ref 0.7–4.0)
MCH: 29.5 pg (ref 26.0–34.0)
MCHC: 33 g/dL (ref 30.0–36.0)
MCV: 89.3 fL (ref 80.0–100.0)
Monocytes Absolute: 0.6 10*3/uL (ref 0.1–1.0)
Monocytes Relative: 8 %
Neutro Abs: 5.4 10*3/uL (ref 1.7–7.7)
Neutrophils Relative %: 79 %
Platelet Count: 230 10*3/uL (ref 150–400)
RBC: 3.46 MIL/uL — ABNORMAL LOW (ref 3.87–5.11)
RDW: 14.1 % (ref 11.5–15.5)
WBC Count: 7 10*3/uL (ref 4.0–10.5)
nRBC: 0 % (ref 0.0–0.2)

## 2021-09-24 NOTE — Progress Notes (Signed)
INDICATION:MGUS, r/o myeloma  Brief examination was performed. ENT: adequate airway clearance Heart: regular rate and rhythm.No Murmurs Lungs: clear to auscultation, no wheezes, normal respiratory effort  Bone Marrow Biopsy and Aspiration Procedure Note   Informed consent was obtained and potential risks including bleeding, infection and pain were reviewed with the patient.  [name] name, date of birth, identification, consent and allergies were verified prior to the start of procedure and time out was performed.  The right posterior iliac crest was chosen as the site of biopsy.  The skin was prepped with ChloraPrep.   8 cc of 2% lidocaine was used to provide local anaesthesia.   10 cc of bone marrow aspirate was obtained followed by 1cm biopsy.  Pressure was applied to the biopsy site and bandage was placed over the biopsy site. Patient was made to lie on the back for 30 mins prior to discharge.  The procedure was tolerated well. COMPLICATIONS: None BLOOD LOSS: none The patient was discharged home in stable condition with a 1 week follow up to review results.  Patient was provided with post bone marrow biopsy instructions and instructed to call if there was any bleeding or worsening pain.  Specimens sent for flow cytometry, cytogenetics and additional studies.  Signed Scot Dock, NP

## 2021-09-24 NOTE — Progress Notes (Signed)
Assessed dressing C,D,I, VSS, reviewed DC paperwork. Patient had no other questions or concerns. Ambulated with staff to the lobby.

## 2021-09-24 NOTE — Patient Instructions (Signed)
Bone Marrow Aspiration and Bone Marrow Biopsy, Adult, Care After This sheet gives you information about how to care for yourself after your procedure. Your health care provider may also give you more specific instructions. If you have problems or questions, contact your health care provider. What can I expect after the procedure? After the procedure, it is common to have: Mild pain and tenderness. Swelling. Bruising. Follow these instructions at home: Puncture site care  Follow instructions from your health care provider about how to take care of the puncture site. Make sure you: Wash your hands with soap and water before and after you change your bandage (dressing). If soap and water are not available, use hand sanitizer. Change your dressing as told by your health care provider. Check your puncture site every day for signs of infection. Check for: More redness, swelling, or pain. Fluid or blood. Warmth. Pus or a bad smell. Activity Return to your normal activities as told by your health care provider. Ask your health care provider what activities are safe for you. Do not lift anything that is heavier than 10 lb (4.5 kg), or the limit that you are told, until your health care provider says that it is safe. Do not drive for 24 hours if you were given a sedative during your procedure. General instructions  Take over-the-counter and prescription medicines only as told by your health care provider. Do not take baths, swim, or use a hot tub until your health care provider approves. Ask your health care provider if you may take showers. You may only be allowed to take sponge baths. If directed, put ice on the affected area. To do this: Put ice in a plastic bag. Place a towel between your skin and the bag. Leave the ice on for 20 minutes, 2-3 times a day. Keep all follow-up visits as told by your health care provider. This is important. Contact a health care provider if: Your pain is not  controlled with medicine. You have a fever. You have more redness, swelling, or pain around the puncture site. You have fluid or blood coming from the puncture site. Your puncture site feels warm to the touch. You have pus or a bad smell coming from the puncture site. Summary After the procedure, it is common to have mild pain, tenderness, swelling, and bruising. Follow instructions from your health care provider about how to take care of the puncture site and what activities are safe for you. Take over-the-counter and prescription medicines only as told by your health care provider. Contact a health care provider if you have any signs of infection, such as fluid or blood coming from the puncture site. This information is not intended to replace advice given to you by your health care provider. Make sure you discuss any questions you have with your health care provider. Document Revised: 04/25/2019 Document Reviewed: 04/25/2019 Elsevier Patient Education  2022 [employer]  

## 2021-09-26 ENCOUNTER — Telehealth: Payer: Self-pay | Admitting: Hematology and Oncology

## 2021-09-26 LAB — SURGICAL PATHOLOGY

## 2021-09-26 NOTE — Telephone Encounter (Signed)
Sch per 9/23 los, pt aware

## 2021-09-30 ENCOUNTER — Other Ambulatory Visit: Payer: Self-pay | Admitting: Hematology and Oncology

## 2021-09-30 ENCOUNTER — Inpatient Hospital Stay: Payer: Medicare PPO | Admitting: Hematology and Oncology

## 2021-09-30 ENCOUNTER — Other Ambulatory Visit: Payer: Self-pay

## 2021-09-30 ENCOUNTER — Inpatient Hospital Stay: Payer: Medicare PPO

## 2021-09-30 VITALS — BP 116/69 | HR 115 | Temp 97.5°F | Resp 18 | Wt 125.1 lb

## 2021-09-30 DIAGNOSIS — Z79899 Other long term (current) drug therapy: Secondary | ICD-10-CM | POA: Diagnosis not present

## 2021-09-30 DIAGNOSIS — C9000 Multiple myeloma not having achieved remission: Secondary | ICD-10-CM

## 2021-09-30 DIAGNOSIS — D649 Anemia, unspecified: Secondary | ICD-10-CM | POA: Diagnosis not present

## 2021-09-30 DIAGNOSIS — N1832 Chronic kidney disease, stage 3b: Secondary | ICD-10-CM | POA: Diagnosis not present

## 2021-09-30 DIAGNOSIS — I129 Hypertensive chronic kidney disease with stage 1 through stage 4 chronic kidney disease, or unspecified chronic kidney disease: Secondary | ICD-10-CM | POA: Diagnosis not present

## 2021-09-30 DIAGNOSIS — Z86718 Personal history of other venous thrombosis and embolism: Secondary | ICD-10-CM | POA: Diagnosis not present

## 2021-09-30 DIAGNOSIS — Z7901 Long term (current) use of anticoagulants: Secondary | ICD-10-CM | POA: Diagnosis not present

## 2021-09-30 LAB — CBC WITH DIFFERENTIAL (CANCER CENTER ONLY)
Abs Immature Granulocytes: 0.02 10*3/uL (ref 0.00–0.07)
Basophils Absolute: 0 10*3/uL (ref 0.0–0.1)
Basophils Relative: 1 %
Eosinophils Absolute: 0.2 10*3/uL (ref 0.0–0.5)
Eosinophils Relative: 3 %
HCT: 32.9 % — ABNORMAL LOW (ref 36.0–46.0)
Hemoglobin: 10.8 g/dL — ABNORMAL LOW (ref 12.0–15.0)
Immature Granulocytes: 0 %
Lymphocytes Relative: 16 %
Lymphs Abs: 1.1 10*3/uL (ref 0.7–4.0)
MCH: 29.4 pg (ref 26.0–34.0)
MCHC: 32.8 g/dL (ref 30.0–36.0)
MCV: 89.6 fL (ref 80.0–100.0)
Monocytes Absolute: 0.6 10*3/uL (ref 0.1–1.0)
Monocytes Relative: 9 %
Neutro Abs: 4.8 10*3/uL (ref 1.7–7.7)
Neutrophils Relative %: 71 %
Platelet Count: 249 10*3/uL (ref 150–400)
RBC: 3.67 MIL/uL — ABNORMAL LOW (ref 3.87–5.11)
RDW: 14.6 % (ref 11.5–15.5)
WBC Count: 6.8 10*3/uL (ref 4.0–10.5)
nRBC: 0 % (ref 0.0–0.2)

## 2021-09-30 LAB — CMP (CANCER CENTER ONLY)
ALT: 16 U/L (ref 0–44)
AST: 15 U/L (ref 15–41)
Albumin: 3.4 g/dL — ABNORMAL LOW (ref 3.5–5.0)
Alkaline Phosphatase: 57 U/L (ref 38–126)
Anion gap: 12 (ref 5–15)
BUN: 63 mg/dL — ABNORMAL HIGH (ref 8–23)
CO2: 22 mmol/L (ref 22–32)
Calcium: 9.9 mg/dL (ref 8.9–10.3)
Chloride: 107 mmol/L (ref 98–111)
Creatinine: 2.38 mg/dL — ABNORMAL HIGH (ref 0.44–1.00)
GFR, Estimated: 19 mL/min — ABNORMAL LOW (ref >60)
Glucose, Bld: 101 mg/dL — ABNORMAL HIGH (ref 70–99)
Potassium: 4 mmol/L (ref 3.5–5.1)
Sodium: 141 mmol/L (ref 135–145)
Total Bilirubin: 0.7 mg/dL (ref 0.3–1.2)
Total Protein: 6.5 g/dL (ref 6.5–8.1)

## 2021-09-30 MED ORDER — ACYCLOVIR 400 MG PO TABS: 400.0000 mg | ORAL_TABLET | Freq: Two times a day (BID) | ORAL | 3 refills | Status: AC

## 2021-09-30 MED ORDER — PROCHLORPERAZINE MALEATE 10 MG PO TABS
10.0000 mg | ORAL_TABLET | Freq: Four times a day (QID) | ORAL | 0 refills | Status: AC | PRN
Start: 2021-09-30 — End: ?

## 2021-09-30 MED ORDER — ONDANSETRON HCL 8 MG PO TABS: 8.0000 mg | ORAL_TABLET | Freq: Three times a day (TID) | ORAL | 0 refills | Status: AC | PRN

## 2021-09-30 MED ORDER — DEXAMETHASONE 4 MG PO TABS: 20.0000 mg | ORAL_TABLET | ORAL | 3 refills | Status: AC

## 2021-09-30 NOTE — Progress Notes (Signed)
[place] [phone]   [phone]  PROGRESS NOTE  Patient Care Team: [name], MD as PCP - General (Internal Medicine) Werner Lean, MD as PCP - Cardiology (Cardiology) Werner Lean, MD as Consulting Physician (Cardiology)  Hematological/Oncological History #Bilateral DVT and PE: 1) 04/23/2021-04/26/2021: Admitted for right rotator cuff repair and found to have acute hypoxia.  Chest xray found small left pleural effusion with associated atelectasis and trace right pleural effusion and right basilar airspace disease. Underwent left thoracentesis with removal of 800 cc. Due to persistent hypoxia, patient underwent NM pulmonary perfusion study that confirmed bilateral pulmonary emboli. Doppler US revealed bilateral acute DVT involving the right soleal veins and left posterior tibial veins. Patient was started on Eliquis.  2)  06/17/2021: Establish care with Dede Query PA-C and recommend Eliquis x 6 months.    #Normocytic Anemia: 1) 06/17/2021: Labs show anemia with Hgb 10.4, MCV 90.1 2) 06/27/2021: Labs show iron deficiency with serum iron 33, iron saturation 13%; vitamin B12 deficiency with vitamin B12 at 266 and elevated MMA at 410. Folate level normal. Recommended oral ferrous sulfate 325 mg once daily and vitamin b12 1000 mcg once daily.    #Multiple Myeloma: 1) 09/04/2021: Labs from [place] -TIBC 228 (L), Iron 64, Iron saturation 28%, Ferritin 216 (H), Creatinine 1.96 (H), BUN 51 (H), Calcium 10.0, -CBC: Hgb 11.2,  -SPEP revealed M-Spike 0.3 with immunofixation that showed monoclonal free kappa light chain.  -sFLC: Free Kappa Lt Chain: 4504.5 (H), Free Lambda Lt Chain: 19.7, Kappa/Lambda Ratio: 228.65 (H) 09/24/2021: bone marrow biopsy showed hypercellular bone marrow for age with plasma cell neoplasm with plasma cells representing 25% of for cells in the aspirate  Interval History:  Jillian Cooley 87 y.o. female with medical history significant for Kappa light chain multiple myeloma who presents for a follow up visit. [name] last visit was on 09/16/2021. In the interim since the last visit she underwent a bone marrow biopsy which showed 25% plasma cells. This, combined with her SFLC ratio and poor kidney function meet the criteria for multiple myeloma.    On exam today Jillian Cooley is accompanied by her husband and daughter.  She notes that she continues to have issues with shortness of breath and low energy.  She was prescribed oxygen which she does occasionally use at home.  She notes that she has not been having any issues with fevers, chills, sweats, nausea, vomiting or diarrhea.  Her health is otherwise been stable.  Full 10 point ROS is listed below.  The bulk of our discussion focused on the diagnosis of multiple myeloma and treatment options moving forward.  This conversation is detailed below.  MEDICAL HISTORY:  Past Medical History:  Diagnosis Date   Abnormal EKG    Carpal tunnel syndrome    CKD (chronic kidney disease), stage III (HCC)    DDD (degenerative disc disease), lumbar    Hypertension    Hypertension with renal disease    Menopause    Mixed hyperlipidemia    Muscular degeneration    Nonrheumatic aortic (valve) insufficiency    Nonrheumatic mitral valve regurgitation    Osteoarthritis    Pedal edema    Pleural effusion    Skin cancer    Vitamin D deficiency     SURGICAL HISTORY: Past Surgical History:  Procedure Laterality Date   ANTERIOR CERVICAL DECOMP/DISCECTOMY FUSION  2009   CARPAL TUNNEL RELEASE Bilateral    IR THORACENTESIS ASP PLEURAL  SPACE W/IMG GUIDE  04/25/2021   LUMBAR LAMINECTOMY/DECOMPRESSION MICRODISCECTOMY  06/13/2012   Procedure: LUMBAR LAMINECTOMY/DECOMPRESSION MICRODISCECTOMY 1 LEVEL;  Surgeon: Ophelia Charter, MD;  Location: [facility];  Service: Neurosurgery;  Laterality: Right;  RIGHT Lumbar two-three diskectomy    SHOULDER ARTHROSCOPY WITH ROTATOR CUFF REPAIR AND SUBACROMIAL DECOMPRESSION Right 04/23/2021   Procedure: SHOULDER ARTHROSCOPY WITH ROTATOR CUFF REPAIR AND SUBACROMIAL DECOMPRESSION;  Surgeon: Earlie Server, MD;  Location: [facility];  Service: Orthopedics;  Laterality: Right;   THORACENTESIS N/A 09/10/2021   Procedure: Mathews Robinsons;  Surgeon: Lanier Clam, MD;  Location: [facility];  Service: Pulmonary;  Laterality: N/A;    SOCIAL HISTORY: Social History   Socioeconomic History   Marital status: Married    Spouse name: Not on file   Number of children: Not on file   Years of education: Not on file   Highest education level: Not on file  Occupational History   Not on file  Tobacco Use   Smoking status: Never   Smokeless tobacco: Never  Substance and Sexual Activity   Alcohol use: No   Drug use: No   Sexual activity: Not on file  Other Topics Concern   Not on file  Social History Narrative   Not on file   Social Determinants of Health   Financial Resource Strain: Not on file  Food Insecurity: Not on file  Transportation Needs: Not on file  Physical Activity: Not on file  Stress: Not on file  Social Connections: Not on file  Intimate Partner Violence: Not on file    FAMILY HISTORY: Family History  Problem Relation Age of Onset   Colon cancer Maternal Grandfather    Breast cancer Neg Hx     ALLERGIES:  is allergic to sulfa antibiotics.  MEDICATIONS:  Current Outpatient Medications  Medication Sig Dispense Refill   acyclovir (ZOVIRAX) 400 MG tablet Take 1 tablet (400 mg total) by mouth 2 (two) times daily. 60 tablet 3   dexamethasone (DECADRON) 4 MG tablet Take 5 tablets (20 mg total) by mouth once a week. 20 tablet 3   ondansetron (ZOFRAN) 8 MG tablet Take 1 tablet (8 mg total) by mouth every 8 (eight) hours as needed. 30 tablet 0   prochlorperazine (COMPAZINE) 10 MG tablet Take 1 tablet (10 mg total) by mouth every 6 (six) hours as needed  for nausea or vomiting. 30 tablet 0   amLODipine (NORVASC) 5 MG tablet Take 5 mg by mouth in the morning.     apixaban (ELIQUIS) 5 MG TABS tablet Take 1 tablet (5 mg total) by mouth 2 (two) times daily. 60 tablet 3   Calcium Carb-Cholecalciferol (CALTRATE 600+D3 PO) Take 1 tablet by mouth daily with breakfast.     ferrous sulfate 325 (65 FE) MG EC tablet Take 1 tablet (325 mg total) by mouth daily with breakfast. 30 tablet 3   furosemide (LASIX) 40 MG tablet Take 1 tablet (40 mg total) by mouth 2 (two) times daily. (Patient taking differently: Take 20-40 mg by mouth See admin instructions. Take 20 mg twice daily on Tues and Thurs, Take 40 mg twice daily on Sun, Mon, Wed, Fri, and Sat) 90 tablet 3   HYDROcodone-acetaminophen (NORCO/VICODIN) 5-325 MG tablet Take 1 tablet by mouth every 4 (four) hours as needed for moderate pain (may need 1-2 first few days followin surgery max 8 tabs in 24hrs). 40 tablet 0   losartan (COZAAR) 25 MG tablet Take 25 mg by mouth in the morning.  Multiple Vitamins-Minerals (PRESERVISION AREDS 2+MULTI VIT PO) Take 1 tablet by mouth 2 (two) times daily.     simvastatin (ZOCOR) 20 MG tablet Take 20 mg by mouth every evening.     vitamin B-12 (CYANOCOBALAMIN) 1000 MCG tablet Take 1 tablet (1,000 mcg total) by mouth daily. 30 tablet 3   No current facility-administered medications for this visit.    REVIEW OF SYSTEMS:   Constitutional: ( - ) fevers, ( - )  chills , ( - ) night sweats Eyes: ( - ) blurriness of vision, ( - ) double vision, ( - ) watery eyes Ears, nose, mouth, throat, and face: ( - ) mucositis, ( - ) sore throat Respiratory: ( - ) cough, ( - ) dyspnea, ( - ) wheezes Cardiovascular: ( - ) palpitation, ( - ) chest discomfort, ( - ) lower extremity swelling Gastrointestinal:  ( - ) nausea, ( - ) heartburn, ( - ) change in bowel habits Skin: ( - ) abnormal skin rashes Lymphatics: ( - ) new lymphadenopathy, ( - ) easy bruising Neurological: ( - ) numbness, (  - ) tingling, ( - ) new weaknesses Behavioral/Psych: ( - ) mood change, ( - ) new changes  All other systems were reviewed with the patient and are negative.  PHYSICAL EXAMINATION: ECOG PERFORMANCE STATUS: 2 - Symptomatic, <50% confined to bed  Vitals:   09/30/21 1019  BP: 116/69  Pulse: (!) 115  Resp: 18  Temp: (!) 97.5 F (36.4 C)  SpO2: 96%   Filed Weights   09/30/21 1019  Weight: 125 lb 1.6 oz (56.7 kg)    GENERAL: alert, no distress and comfortable SKIN: skin color, texture, turgor are normal, no rashes or significant lesions EYES: conjunctiva are pink and non-injected, sclera clear LUNGS: clear to auscultation and percussion with normal breathing effort HEART: regular rate & rhythm and no murmurs and no lower extremity edema Musculoskeletal: no cyanosis of digits and no clubbing  PSYCH: alert & oriented x 3, fluent speech NEURO: no focal motor/sensory deficits  LABORATORY DATA:  I have reviewed the data as listed CBC Latest Ref Rng & Units 09/30/2021 09/24/2021 09/16/2021  WBC 4.0 - 10.5 K/uL 6.8 7.0 6.3  Hemoglobin 12.0 - 15.0 g/dL 10.8(L) 10.2(L) 10.4(L)  Hematocrit 36.0 - 46.0 % 32.9(L) 30.9(L) 32.0(L)  Platelets 150 - 400 K/uL 249 230 220    CMP Latest Ref Rng & Units 09/30/2021 09/16/2021 07/31/2021  Glucose 70 - 99 mg/dL 101(H) 123(H) 103(H)  BUN 8 - 23 mg/dL 63(H) 70(H) 61(H)  Creatinine 0.44 - 1.00 mg/dL 2.38(H) 2.29(H) 2.26(H)  Sodium 135 - 145 mmol/L 141 138 141  Potassium 3.5 - 5.1 mmol/L 4.0 4.2 4.4  Chloride 98 - 111 mmol/L 107 103 104  CO2 22 - 32 mmol/L 22 21(L) 19(L)  Calcium 8.9 - 10.3 mg/dL 9.9 10.2 9.4  Total Protein 6.5 - 8.1 g/dL 6.5 6.5 -  Total Bilirubin 0.3 - 1.2 mg/dL 0.7 0.8 -  Alkaline Phos 38 - 126 U/L 57 53 -  AST 15 - 41 U/L 15 17 -  ALT 0 - 44 U/L 16 13 -    No results found for: MPROTEIN Lab Results  Component Value Date   KAPLAMBRATIO 534.21 (H) 09/18/2021   RADIOGRAPHIC STUDIES: DG Chest 2 View  Result Date:  09/02/2021 CLINICAL DATA:  87 year old female with a history of pleural effusion EXAM: CHEST - 2 VIEW COMPARISON:  01/07/2021, 04/25/2021 FINDINGS: Cardiomediastinal silhouette likely unchanged though the heart border  partially obscured by overlying lung and pleural disease. New opacity in the left lower lung obscuring the left heart border and left hemidiaphragm. Meniscus on the lateral view. Mild meniscus at the right lung base with improved aeration compared to the plain film dated May 2022. No pneumothorax. Coarsened interstitial markings bilaterally. Degenerative changes of the spine IMPRESSION: New left-sided opacity, likely combination of pleural effusion, atelectasis, and/or consolidation. Trace right-sided pleural effusion. Electronically Signed   By: Corrie Mckusick D.O.   On: 09/02/2021 16:46   US RENAL  Result Date: 09/14/2021 CLINICAL DATA:  Stage IIIB chronic kidney disease. EXAM: RENAL / URINARY TRACT ULTRASOUND COMPLETE COMPARISON:  None. FINDINGS: Right Kidney: Renal measurements: 9.5 x 3.4 x 3.8 cm = volume: 64 mL. Echogenicity within normal limits. There is an anechoic 2.5 x 2.3 x 2.6 cm right lower pole cystic lesion likely represents a simple renal cyst. No solid mass or hydronephrosis visualized. Left Kidney: Renal measurements: 0.8 x 3.9 x 4.7 cm = volume: 84 mL. Echogenicity within normal limits. No mass or hydronephrosis visualized. Bladder: Appears normal for degree of bladder distention. Other: None. IMPRESSION: Unremarkable renal ultrasound. Electronically Signed   By: Iven Finn M.D.   On: 09/14/2021 19:42   DG Chest Port 1 View  Result Date: 09/10/2021 CLINICAL DATA:  Pleural effusion EXAM: PORTABLE CHEST 1 VIEW COMPARISON:  Portable exam 1527 hours compared to 09/02/2021 FINDINGS: Normal heart size, mediastinal contours, and pulmonary vascularity. Atherosclerotic calcification aorta. Significant decrease in LEFT pleural effusion since previous exam. Edema versus  consolidation in lower LEFT lung. Minimal subsegmental atelectasis and tiny pleural effusion at RIGHT lung base. Upper lungs clear. No pneumothorax or acute osseous findings. IMPRESSION: Small bibasilar pleural effusions and atelectasis greater on LEFT. Significant decrease in LEFT pleural effusion since previous exam. Residual infiltrate versus edema in lower LEFT lung. Electronically Signed   By: Lavonia Dana M.D.   On: 09/10/2021 15:56    ASSESSMENT & PLAN Jillian Cooley 87 y.o. female with medical history significant for Kappa light chain multiple myeloma who presents for a follow up visit.   Today we discussed the nature of multiple myeloma and the treatment options moving forward.  The goal of treatment will be to get the patient into a durable remission on maintenance therapy.  Given her advanced age she is not a transplant candidate.  We discussed that this is a blood cancer there is of the bone marrow and can cause kidney dysfunction, anemia, and obstruction to the bone.  The patient currently has kidney dysfunction and anemia and we are currently awaiting the results of a metastatic bone survey.  Treatment of choice for this patient will be a chemotherapy regimen CyBorD.  This will consist of weekly treatments of cyclophosphamide 300 mg per metered squared, bortezomib 1.5 mg per metered squared, and 20 mg of dexamethasone p.o.  This will be continued until patient has completed 8 cycles were reached a VG PR.  At that time we can transition to maintenance therapy.  If the patient's kidney function were to improve markedly we could transition to a Revlimid-based treatment instead.  The patient and her family voiced understanding of this plan moving forward.  #Hx of bilateral DVT and PE: --Diagnosed on 04/25/2021 following recent orthopedic surgery on 04/23/2021, which is likely the provoking cause. --Currently on Eliquis 5 mg BID. Recommend to continue for a total of six months (around  November 2022) --Labs from 06/17/2021 ruled out antiphospholipid syndrome.    #Normocytic anemia: --  Multifactorial with CKD, iron deficiency and vitamin B12 deficiency.  --Labs from 06/27/2021 showed vitamin B12 level of 266, methylmalonic acid elevated 410, iron saturation 13%, serum iron 33, ferritin 130. --Recommended to start ferrous sulfate 325 mg once daily and vitamin B12 1000 mcg once daily. Recommend to continue.  --Repeat labs form 09/04/2021 (outside labs) show improvement of iron panel with iron 64, Iron saturation 28%, Ferritin 216.   #Multiple Myeloma: --Outside SPEP from 09/04/2021  revealed M-Spike 0.3 with immunofixation that showed monoclonal free kappa light chain. Serum FLC: Free Kappa Lt Chain: 4504.5 (H), Free Lambda Lt Chain: 19.7, Kappa/Lambda Ratio: 228.65 (H) --Need DG bone met survey to evaluate for lytic lesions. Ordered, not yet scheduled.  --Elevated serum free light chain ratio with worsening renal function meets criteria for bone marrow biopsy, performed on 09/24/2021. R-ISS stage II Plan: --findings consistent with MM. Due to renal dysfunction will start treatment with CyBorD chemotherapy --monthly SPEP, UPEP, and SFLC while on treatment. Weekly CMP, CBC, and LDH --plan to start weekly treatment as early as next week with q 2 week clinic visits  #Supportive Care -- chemotherapy education to be scheduled  -- port placement not required --IV zometa q 3 months to start after dental clearance.  -- zofran 2m q8H PRN and compazine 167mPO q6H for nausea -- acyclovir 40034mO BID for VCZ prophylaxis -- no pain medication required at this time.    Orders Placed This Encounter  Procedures   CMP ([facility])    Standing Status:   Standing    Number of Occurrences:   52    Standing Expiration Date:   09/30/2022   CBC with Differential ([facility])    Standing Status:   Standing    Number of Occurrences:   52 77 Standing Expiration Date:    09/30/2022   Lactate dehydrogenase (LDH)    Standing Status:   Standing    Number of Occurrences:   52    Standing Expiration Date:   09/30/2022   Multiple Myeloma Panel (SPEP&IFE w/QIG)    Standing Status:   Standing    Number of Occurrences:   12    Standing Expiration Date:   09/30/2022   Kappa/lambda light chains    Standing Status:   Standing    Number of Occurrences:   12    Standing Expiration Date:   09/30/2022   24-Hr Ur UPEP/UIFE/Light Chains/TP    Standing Status:   Standing    Number of Occurrences:   12    Standing Expiration Date:   09/30/2022    All questions were answered. The patient knows to call the clinic with any problems, questions or concerns.  A total of more than 30 minutes were spent on this encounter with face-to-face time and non-face-to-face time, including preparing to see the patient, ordering tests and/or medications, counseling the patient and coordination of care as outlined above.   JohLedell PeoplesD Department of Hematology/Oncology [facility]one: [phone]: [phone]: [email or web address]  09/30/2021 11:22 AM

## 2021-09-30 NOTE — Progress Notes (Signed)
START ON PATHWAY REGIMEN - Multiple Myeloma and Other Plasma Cell Dyscrasias     A cycle is every 28 days:     Dexamethasone      Bortezomib      Cyclophosphamide   **Always confirm dose/schedule in your pharmacy ordering system**  Patient Characteristics: Multiple Myeloma, Newly Diagnosed, Transplant Ineligible or Refused, Unknown or Awaiting Test Results Disease Classification: Multiple Myeloma R-ISS Staging: II Therapeutic Status: Newly Diagnosed Is Patient Eligible for Transplant<= Transplant Ineligible or Refused Risk Status: Awaiting Test Results Intent of Therapy: Non-Curative / Palliative Intent, Discussed with Patient

## 2021-10-02 ENCOUNTER — Encounter (HOSPITAL_COMMUNITY): Payer: Self-pay | Admitting: Physician Assistant

## 2021-10-03 NOTE — Progress Notes (Signed)
Pharmacist Chemotherapy Monitoring - Initial Assessment    Anticipated start date: 10/10/21   The following has been reviewed per standard work regarding the patient's treatment regimen: [name] diagnosis, treatment plan and drug doses, and organ/hematologic function Lab orders and baseline tests specific to treatment regimen  The treatment plan start date, drug sequencing, and pre-medications Prior authorization status  Patient's documented medication list, including drug-drug interaction screen and prescriptions for anti-emetics and supportive care specific to the treatment regimen The drug concentrations, fluid compatibility, administration routes, and timing of the medications to be used [name] access for treatment and lifetime cumulative dose history, if applicable  [name] medication allergies and previous infusion related reactions, if applicable   Changes made to treatment plan:  N/A  Follow up needed:  N/A   Larene Beach, RPH, 10/03/2021  11:24 AM

## 2021-10-06 ENCOUNTER — Encounter (HOSPITAL_COMMUNITY): Payer: Self-pay | Admitting: Physician Assistant

## 2021-10-08 ENCOUNTER — Other Ambulatory Visit: Payer: Self-pay | Admitting: Physician Assistant

## 2021-10-09 ENCOUNTER — Ambulatory Visit (HOSPITAL_COMMUNITY): Payer: Medicare PPO

## 2021-10-09 ENCOUNTER — Other Ambulatory Visit: Payer: Self-pay | Admitting: Hematology and Oncology

## 2021-10-09 ENCOUNTER — Other Ambulatory Visit (HOSPITAL_COMMUNITY): Payer: Medicare PPO

## 2021-10-09 ENCOUNTER — Encounter: Payer: Self-pay | Admitting: Hematology and Oncology

## 2021-10-10 ENCOUNTER — Emergency Department (HOSPITAL_COMMUNITY): Payer: Medicare PPO

## 2021-10-10 ENCOUNTER — Telehealth: Payer: Self-pay | Admitting: Physician Assistant

## 2021-10-10 ENCOUNTER — Inpatient Hospital Stay: Payer: Medicare PPO

## 2021-10-10 ENCOUNTER — Inpatient Hospital Stay: Payer: Medicare PPO | Admitting: Physician Assistant

## 2021-10-10 ENCOUNTER — Other Ambulatory Visit: Payer: Self-pay

## 2021-10-10 ENCOUNTER — Inpatient Hospital Stay (HOSPITAL_COMMUNITY)
Admission: EM | Admit: 2021-10-10 | Discharge: 2021-10-25 | DRG: 166 | Disposition: A | Discharge status: Home Health | Payer: Medicare PPO | Attending: Internal Medicine | Admitting: Internal Medicine

## 2021-10-10 ENCOUNTER — Encounter (HOSPITAL_COMMUNITY): Payer: Self-pay

## 2021-10-10 DIAGNOSIS — I455 Other specified heart block: Secondary | ICD-10-CM | POA: Diagnosis not present

## 2021-10-10 DIAGNOSIS — R918 Other nonspecific abnormal finding of lung field: Secondary | ICD-10-CM | POA: Diagnosis not present

## 2021-10-10 DIAGNOSIS — R0689 Other abnormalities of breathing: Secondary | ICD-10-CM | POA: Diagnosis not present

## 2021-10-10 DIAGNOSIS — I130 Hypertensive heart and chronic kidney disease with heart failure and stage 1 through stage 4 chronic kidney disease, or unspecified chronic kidney disease: Secondary | ICD-10-CM | POA: Diagnosis not present

## 2021-10-10 DIAGNOSIS — J90 Pleural effusion, not elsewhere classified: Secondary | ICD-10-CM | POA: Diagnosis not present

## 2021-10-10 DIAGNOSIS — I441 Atrioventricular block, second degree: Secondary | ICD-10-CM | POA: Diagnosis not present

## 2021-10-10 DIAGNOSIS — R778 Other specified abnormalities of plasma proteins: Secondary | ICD-10-CM | POA: Diagnosis not present

## 2021-10-10 DIAGNOSIS — R627 Adult failure to thrive: Secondary | ICD-10-CM | POA: Diagnosis present

## 2021-10-10 DIAGNOSIS — I371 Nonrheumatic pulmonary valve insufficiency: Secondary | ICD-10-CM | POA: Diagnosis not present

## 2021-10-10 DIAGNOSIS — R609 Edema, unspecified: Secondary | ICD-10-CM | POA: Diagnosis not present

## 2021-10-10 DIAGNOSIS — R0600 Dyspnea, unspecified: Secondary | ICD-10-CM

## 2021-10-10 DIAGNOSIS — I2699 Other pulmonary embolism without acute cor pulmonale: Secondary | ICD-10-CM | POA: Diagnosis present

## 2021-10-10 DIAGNOSIS — I129 Hypertensive chronic kidney disease with stage 1 through stage 4 chronic kidney disease, or unspecified chronic kidney disease: Secondary | ICD-10-CM | POA: Diagnosis present

## 2021-10-10 DIAGNOSIS — Z79899 Other long term (current) drug therapy: Secondary | ICD-10-CM

## 2021-10-10 DIAGNOSIS — J9600 Acute respiratory failure, unspecified whether with hypoxia or hypercapnia: Secondary | ICD-10-CM | POA: Diagnosis not present

## 2021-10-10 DIAGNOSIS — J918 Pleural effusion in other conditions classified elsewhere: Secondary | ICD-10-CM | POA: Diagnosis present

## 2021-10-10 DIAGNOSIS — Z7901 Long term (current) use of anticoagulants: Secondary | ICD-10-CM

## 2021-10-10 DIAGNOSIS — I951 Orthostatic hypotension: Secondary | ICD-10-CM | POA: Diagnosis not present

## 2021-10-10 DIAGNOSIS — I2510 Atherosclerotic heart disease of native coronary artery without angina pectoris: Secondary | ICD-10-CM | POA: Diagnosis present

## 2021-10-10 DIAGNOSIS — R0602 Shortness of breath: Secondary | ICD-10-CM

## 2021-10-10 DIAGNOSIS — Z86718 Personal history of other venous thrombosis and embolism: Secondary | ICD-10-CM

## 2021-10-10 DIAGNOSIS — Z85828 Personal history of other malignant neoplasm of skin: Secondary | ICD-10-CM

## 2021-10-10 DIAGNOSIS — N184 Chronic kidney disease, stage 4 (severe): Secondary | ICD-10-CM | POA: Diagnosis not present

## 2021-10-10 DIAGNOSIS — J80 Acute respiratory distress syndrome: Secondary | ICD-10-CM | POA: Diagnosis not present

## 2021-10-10 DIAGNOSIS — Z800 Family history of malignant neoplasm of digestive organs: Secondary | ICD-10-CM

## 2021-10-10 DIAGNOSIS — N179 Acute kidney failure, unspecified: Secondary | ICD-10-CM | POA: Diagnosis not present

## 2021-10-10 DIAGNOSIS — R64 Cachexia: Secondary | ICD-10-CM | POA: Diagnosis not present

## 2021-10-10 DIAGNOSIS — I21A1 Myocardial infarction type 2: Secondary | ICD-10-CM | POA: Diagnosis not present

## 2021-10-10 DIAGNOSIS — R000 Tachycardia, unspecified: Secondary | ICD-10-CM | POA: Diagnosis not present

## 2021-10-10 DIAGNOSIS — R54 Age-related physical debility: Secondary | ICD-10-CM | POA: Diagnosis present

## 2021-10-10 DIAGNOSIS — E871 Hypo-osmolality and hyponatremia: Secondary | ICD-10-CM | POA: Diagnosis not present

## 2021-10-10 DIAGNOSIS — Z20822 Contact with and (suspected) exposure to covid-19: Secondary | ICD-10-CM | POA: Diagnosis present

## 2021-10-10 DIAGNOSIS — D631 Anemia in chronic kidney disease: Secondary | ICD-10-CM | POA: Diagnosis present

## 2021-10-10 DIAGNOSIS — I472 Ventricular tachycardia, unspecified: Secondary | ICD-10-CM | POA: Diagnosis not present

## 2021-10-10 DIAGNOSIS — J811 Chronic pulmonary edema: Secondary | ICD-10-CM | POA: Diagnosis not present

## 2021-10-10 DIAGNOSIS — D649 Anemia, unspecified: Secondary | ICD-10-CM | POA: Diagnosis present

## 2021-10-10 DIAGNOSIS — R739 Hyperglycemia, unspecified: Secondary | ICD-10-CM | POA: Diagnosis not present

## 2021-10-10 DIAGNOSIS — I2729 Other secondary pulmonary hypertension: Secondary | ICD-10-CM | POA: Diagnosis not present

## 2021-10-10 DIAGNOSIS — Z9981 Dependence on supplemental oxygen: Secondary | ICD-10-CM

## 2021-10-10 DIAGNOSIS — R0609 Other forms of dyspnea: Secondary | ICD-10-CM | POA: Diagnosis not present

## 2021-10-10 DIAGNOSIS — Z9689 Presence of other specified functional implants: Secondary | ICD-10-CM

## 2021-10-10 DIAGNOSIS — M1990 Unspecified osteoarthritis, unspecified site: Secondary | ICD-10-CM | POA: Diagnosis present

## 2021-10-10 DIAGNOSIS — L89152 Pressure ulcer of sacral region, stage 2: Secondary | ICD-10-CM | POA: Diagnosis not present

## 2021-10-10 DIAGNOSIS — I214 Non-ST elevation (NSTEMI) myocardial infarction: Secondary | ICD-10-CM | POA: Diagnosis not present

## 2021-10-10 DIAGNOSIS — Z681 Body mass index (BMI) 19 or less, adult: Secondary | ICD-10-CM

## 2021-10-10 DIAGNOSIS — I959 Hypotension, unspecified: Secondary | ICD-10-CM | POA: Diagnosis not present

## 2021-10-10 DIAGNOSIS — R079 Chest pain, unspecified: Secondary | ICD-10-CM

## 2021-10-10 DIAGNOSIS — J9811 Atelectasis: Secondary | ICD-10-CM | POA: Diagnosis not present

## 2021-10-10 DIAGNOSIS — N189 Chronic kidney disease, unspecified: Secondary | ICD-10-CM | POA: Diagnosis not present

## 2021-10-10 DIAGNOSIS — I5033 Acute on chronic diastolic (congestive) heart failure: Secondary | ICD-10-CM | POA: Diagnosis not present

## 2021-10-10 DIAGNOSIS — Z981 Arthrodesis status: Secondary | ICD-10-CM

## 2021-10-10 DIAGNOSIS — I340 Nonrheumatic mitral (valve) insufficiency: Secondary | ICD-10-CM

## 2021-10-10 DIAGNOSIS — I517 Cardiomegaly: Secondary | ICD-10-CM | POA: Diagnosis not present

## 2021-10-10 DIAGNOSIS — E782 Mixed hyperlipidemia: Secondary | ICD-10-CM | POA: Diagnosis present

## 2021-10-10 DIAGNOSIS — I2722 Pulmonary hypertension due to left heart disease: Secondary | ICD-10-CM | POA: Diagnosis present

## 2021-10-10 DIAGNOSIS — Z882 Allergy status to sulfonamides status: Secondary | ICD-10-CM

## 2021-10-10 DIAGNOSIS — E876 Hypokalemia: Secondary | ICD-10-CM | POA: Diagnosis not present

## 2021-10-10 DIAGNOSIS — M5136 Other intervertebral disc degeneration, lumbar region: Secondary | ICD-10-CM | POA: Diagnosis present

## 2021-10-10 DIAGNOSIS — J939 Pneumothorax, unspecified: Secondary | ICD-10-CM | POA: Diagnosis not present

## 2021-10-10 DIAGNOSIS — I10 Essential (primary) hypertension: Secondary | ICD-10-CM | POA: Diagnosis present

## 2021-10-10 DIAGNOSIS — R7989 Other specified abnormal findings of blood chemistry: Secondary | ICD-10-CM | POA: Diagnosis not present

## 2021-10-10 DIAGNOSIS — E43 Unspecified severe protein-calorie malnutrition: Secondary | ICD-10-CM | POA: Diagnosis present

## 2021-10-10 DIAGNOSIS — I2721 Secondary pulmonary arterial hypertension: Secondary | ICD-10-CM | POA: Diagnosis present

## 2021-10-10 DIAGNOSIS — I083 Combined rheumatic disorders of mitral, aortic and tricuspid valves: Secondary | ICD-10-CM | POA: Diagnosis not present

## 2021-10-10 DIAGNOSIS — Z86711 Personal history of pulmonary embolism: Secondary | ICD-10-CM

## 2021-10-10 DIAGNOSIS — Z48813 Encounter for surgical aftercare following surgery on the respiratory system: Secondary | ICD-10-CM | POA: Diagnosis not present

## 2021-10-10 DIAGNOSIS — K7460 Unspecified cirrhosis of liver: Secondary | ICD-10-CM

## 2021-10-10 DIAGNOSIS — J9621 Acute and chronic respiratory failure with hypoxia: Secondary | ICD-10-CM | POA: Diagnosis not present

## 2021-10-10 DIAGNOSIS — E559 Vitamin D deficiency, unspecified: Secondary | ICD-10-CM | POA: Diagnosis present

## 2021-10-10 DIAGNOSIS — Z9889 Other specified postprocedural states: Secondary | ICD-10-CM

## 2021-10-10 DIAGNOSIS — I378 Other nonrheumatic pulmonary valve disorders: Secondary | ICD-10-CM

## 2021-10-10 DIAGNOSIS — R0902 Hypoxemia: Secondary | ICD-10-CM | POA: Diagnosis not present

## 2021-10-10 DIAGNOSIS — E872 Acidosis, unspecified: Secondary | ICD-10-CM | POA: Diagnosis present

## 2021-10-10 DIAGNOSIS — I361 Nonrheumatic tricuspid (valve) insufficiency: Secondary | ICD-10-CM | POA: Diagnosis not present

## 2021-10-10 DIAGNOSIS — N281 Cyst of kidney, acquired: Secondary | ICD-10-CM | POA: Diagnosis not present

## 2021-10-10 DIAGNOSIS — K7689 Other specified diseases of liver: Secondary | ICD-10-CM | POA: Diagnosis not present

## 2021-10-10 DIAGNOSIS — N178 Other acute kidney failure: Secondary | ICD-10-CM | POA: Diagnosis not present

## 2021-10-10 DIAGNOSIS — L8990 Pressure ulcer of unspecified site, unspecified stage: Secondary | ICD-10-CM | POA: Insufficient documentation

## 2021-10-10 DIAGNOSIS — C9000 Multiple myeloma not having achieved remission: Secondary | ICD-10-CM | POA: Diagnosis not present

## 2021-10-10 DIAGNOSIS — I3139 Other pericardial effusion (noninflammatory): Secondary | ICD-10-CM | POA: Diagnosis not present

## 2021-10-10 DIAGNOSIS — Z66 Do not resuscitate: Secondary | ICD-10-CM | POA: Diagnosis not present

## 2021-10-10 DIAGNOSIS — J9601 Acute respiratory failure with hypoxia: Secondary | ICD-10-CM | POA: Diagnosis not present

## 2021-10-10 DIAGNOSIS — J948 Other specified pleural conditions: Secondary | ICD-10-CM | POA: Diagnosis not present

## 2021-10-10 DIAGNOSIS — R188 Other ascites: Secondary | ICD-10-CM

## 2021-10-10 DIAGNOSIS — R062 Wheezing: Secondary | ICD-10-CM | POA: Diagnosis not present

## 2021-10-10 LAB — COMPREHENSIVE METABOLIC PANEL
ALT: 34 U/L (ref 0–44)
ALT: 30 U/L (ref 0–44)
AST: 33 U/L (ref 15–41)
AST: 32 U/L (ref 15–41)
Albumin: 3.8 g/dL (ref 3.5–5.0)
Albumin: 3.4 g/dL — ABNORMAL LOW (ref 3.5–5.0)
Alkaline Phosphatase: 56 U/L (ref 38–126)
Alkaline Phosphatase: 45 U/L (ref 38–126)
Anion gap: 14 (ref 5–15)
Anion gap: 13 (ref 5–15)
BUN: 74 mg/dL — ABNORMAL HIGH (ref 8–23)
BUN: 73 mg/dL — ABNORMAL HIGH (ref 8–23)
CO2: 19 mmol/L — ABNORMAL LOW (ref 22–32)
CO2: 18 mmol/L — ABNORMAL LOW (ref 22–32)
Calcium: 9.6 mg/dL (ref 8.9–10.3)
Calcium: 9 mg/dL (ref 8.9–10.3)
Chloride: 103 mmol/L (ref 98–111)
Chloride: 107 mmol/L (ref 98–111)
Creatinine, Ser: 2.43 mg/dL — ABNORMAL HIGH (ref 0.44–1.00)
Creatinine, Ser: 1.93 mg/dL — ABNORMAL HIGH (ref 0.44–1.00)
GFR, Estimated: 19 mL/min — ABNORMAL LOW (ref >60)
GFR, Estimated: 25 mL/min — ABNORMAL LOW (ref >60)
Glucose, Bld: 322 mg/dL — ABNORMAL HIGH (ref 70–99)
Glucose, Bld: 105 mg/dL — ABNORMAL HIGH (ref 70–99)
Potassium: 4.6 mmol/L (ref 3.5–5.1)
Potassium: 4.2 mmol/L (ref 3.5–5.1)
Sodium: 136 mmol/L (ref 135–145)
Sodium: 138 mmol/L (ref 135–145)
Total Bilirubin: 0.7 mg/dL (ref 0.3–1.2)
Total Bilirubin: 0.8 mg/dL (ref 0.3–1.2)
Total Protein: 6.9 g/dL (ref 6.5–8.1)
Total Protein: 6 g/dL — ABNORMAL LOW (ref 6.5–8.1)

## 2021-10-10 LAB — CBC WITH DIFFERENTIAL/PLATELET
Abs Immature Granulocytes: 0.06 10*3/uL (ref 0.00–0.07)
Abs Immature Granulocytes: 0.08 10*3/uL — ABNORMAL HIGH (ref 0.00–0.07)
Basophils Absolute: 0 10*3/uL (ref 0.0–0.1)
Basophils Absolute: 0 10*3/uL (ref 0.0–0.1)
Basophils Relative: 0 %
Basophils Relative: 0 %
Eosinophils Absolute: 0 10*3/uL (ref 0.0–0.5)
Eosinophils Absolute: 0 10*3/uL (ref 0.0–0.5)
Eosinophils Relative: 0 %
Eosinophils Relative: 0 %
HCT: 37.4 % (ref 36.0–46.0)
HCT: 34.2 % — ABNORMAL LOW (ref 36.0–46.0)
Hemoglobin: 11.5 g/dL — ABNORMAL LOW (ref 12.0–15.0)
Hemoglobin: 10.6 g/dL — ABNORMAL LOW (ref 12.0–15.0)
Immature Granulocytes: 1 %
Immature Granulocytes: 1 %
Lymphocytes Relative: 16 %
Lymphocytes Relative: 7 %
Lymphs Abs: 1.6 10*3/uL (ref 0.7–4.0)
Lymphs Abs: 0.8 10*3/uL (ref 0.7–4.0)
MCH: 29.6 pg (ref 26.0–34.0)
MCH: 29.3 pg (ref 26.0–34.0)
MCHC: 30.7 g/dL (ref 30.0–36.0)
MCHC: 31 g/dL (ref 30.0–36.0)
MCV: 96.1 fL (ref 80.0–100.0)
MCV: 94.5 fL (ref 80.0–100.0)
Monocytes Absolute: 0.1 10*3/uL (ref 0.1–1.0)
Monocytes Absolute: 0.5 10*3/uL (ref 0.1–1.0)
Monocytes Relative: 1 %
Monocytes Relative: 4 %
Neutro Abs: 8.4 10*3/uL — ABNORMAL HIGH (ref 1.7–7.7)
Neutro Abs: 9.5 10*3/uL — ABNORMAL HIGH (ref 1.7–7.7)
Neutrophils Relative %: 82 %
Neutrophils Relative %: 88 %
Platelets: 311 10*3/uL (ref 150–400)
Platelets: 218 10*3/uL (ref 150–400)
RBC: 3.89 MIL/uL (ref 3.87–5.11)
RBC: 3.62 MIL/uL — ABNORMAL LOW (ref 3.87–5.11)
RDW: 14.8 % (ref 11.5–15.5)
RDW: 14.8 % (ref 11.5–15.5)
WBC: 10.1 10*3/uL (ref 4.0–10.5)
WBC: 10.9 10*3/uL — ABNORMAL HIGH (ref 4.0–10.5)
nRBC: 0 % (ref 0.0–0.2)
nRBC: 0 % (ref 0.0–0.2)

## 2021-10-10 LAB — GLUCOSE, PLEURAL OR PERITONEAL FLUID: Glucose, Fluid: 162 mg/dL

## 2021-10-10 LAB — RESP PANEL BY RT-PCR (FLU A&B, COVID) ARPGX2
Influenza A by PCR: NEGATIVE
Influenza B by PCR: NEGATIVE
SARS Coronavirus 2 by RT PCR: NEGATIVE

## 2021-10-10 LAB — PROTEIN, PLEURAL OR PERITONEAL FLUID: Total protein, fluid: <3 g/dL

## 2021-10-10 LAB — URINALYSIS, ROUTINE W REFLEX MICROSCOPIC
Bilirubin Urine: NEGATIVE
Glucose, UA: NEGATIVE mg/dL
Hgb urine dipstick: NEGATIVE
Ketones, ur: NEGATIVE mg/dL
Nitrite: NEGATIVE
Protein, ur: NEGATIVE mg/dL
Specific Gravity, Urine: 1.013 (ref 1.005–1.030)
pH: 5 (ref 5.0–8.0)

## 2021-10-10 LAB — HEPARIN LEVEL (UNFRACTIONATED): Heparin Unfractionated: >1.1 IU/mL — ABNORMAL HIGH (ref 0.30–0.70)

## 2021-10-10 LAB — BODY FLUID CELL COUNT WITH DIFFERENTIAL
Eos, Fluid: 0 %
Lymphs, Fluid: 70 %
Monocyte-Macrophage-Serous Fluid: 23 % — ABNORMAL LOW (ref 50–90)
Neutrophil Count, Fluid: 7 % (ref 0–25)
Total Nucleated Cell Count, Fluid: 522 cu mm (ref 0–1000)

## 2021-10-10 LAB — PROCALCITONIN: Procalcitonin: <0.1 ng/mL

## 2021-10-10 LAB — TROPONIN I (HIGH SENSITIVITY)
Troponin I (High Sensitivity): 523 ng/L (ref <18)
Troponin I (High Sensitivity): 836 ng/L (ref <18)
Troponin I (High Sensitivity): 3307 ng/L (ref <18)
Troponin I (High Sensitivity): 5378 ng/L (ref <18)

## 2021-10-10 LAB — BLOOD GAS, ARTERIAL
Acid-base deficit: 13.9 mmol/L — ABNORMAL HIGH (ref 0.0–2.0)
Acid-base deficit: 5.5 mmol/L — ABNORMAL HIGH (ref 0.0–2.0)
Bicarbonate: 15.5 mmol/L — ABNORMAL LOW (ref 20.0–28.0)
Bicarbonate: 18.5 mmol/L — ABNORMAL LOW (ref 20.0–28.0)
FIO2: 100
O2 Saturation: 91.5 %
O2 Saturation: 96.7 %
Patient temperature: 98.7
Patient temperature: 98.7
pCO2 arterial: 51.8 mmHg — ABNORMAL HIGH (ref 32.0–48.0)
pCO2 arterial: 32.7 mmHg (ref 32.0–48.0)
pH, Arterial: 7.103 — CL (ref 7.350–7.450)
pH, Arterial: 7.372 (ref 7.350–7.450)
pO2, Arterial: 89 mmHg (ref 83.0–108.0)
pO2, Arterial: 89.5 mmHg (ref 83.0–108.0)

## 2021-10-10 LAB — MAGNESIUM: Magnesium: 2.3 mg/dL (ref 1.7–2.4)

## 2021-10-10 LAB — ECHOCARDIOGRAM COMPLETE
Calc EF: 38.4 %
MV M vel: 4 m/s
MV Peak grad: 63.8 mmHg
P 1/2 time: 242 msec
Radius: 0.6 cm
S' Lateral: 2.7 cm
Single Plane A2C EF: 46.2 %
Single Plane A4C EF: 35.8 %

## 2021-10-10 LAB — PHOSPHORUS: Phosphorus: 5.8 mg/dL — ABNORMAL HIGH (ref 2.5–4.6)

## 2021-10-10 LAB — ALBUMIN, PLEURAL OR PERITONEAL FLUID: Albumin, Fluid: 1.9 g/dL

## 2021-10-10 LAB — LACTIC ACID, PLASMA: Lactic Acid, Venous: 4.3 mmol/L (ref 0.5–1.9)

## 2021-10-10 LAB — APTT
aPTT: 29 seconds (ref 24–36)
aPTT: 58 seconds — ABNORMAL HIGH (ref 24–36)

## 2021-10-10 LAB — LACTATE DEHYDROGENASE, PLEURAL OR PERITONEAL FLUID: LD, Fluid: 64 U/L — ABNORMAL HIGH (ref 3–23)

## 2021-10-10 LAB — BRAIN NATRIURETIC PEPTIDE: B Natriuretic Peptide: 1070.9 pg/mL — ABNORMAL HIGH (ref 0.0–100.0)

## 2021-10-10 LAB — PROTIME-INR
INR: 1.7 — ABNORMAL HIGH (ref 0.8–1.2)
Prothrombin Time: 19.7 seconds — ABNORMAL HIGH (ref 11.4–15.2)

## 2021-10-10 LAB — LIPASE, BLOOD: Lipase: 34 U/L (ref 11–51)

## 2021-10-10 LAB — MRSA NEXT GEN BY PCR, NASAL: MRSA by PCR Next Gen: NOT DETECTED

## 2021-10-10 LAB — STREP PNEUMONIAE URINARY ANTIGEN: Strep Pneumo Urinary Antigen: NEGATIVE

## 2021-10-10 LAB — AMYLASE: Amylase: 29 U/L (ref 28–100)

## 2021-10-10 MED ORDER — PHENYLEPHRINE HCL-NACL 20-0.9 MG/250ML-% IV SOLN: 25.0000 ug/min | INTRAVENOUS | Status: DC

## 2021-10-10 MED ORDER — SODIUM CHLORIDE 0.9 % IV SOLN
500.0000 mg | INTRAVENOUS | Status: DC
  Administered 2021-10-10: 500 mg via INTRAVENOUS
  Filled 2021-10-10: qty 500

## 2021-10-10 MED ORDER — ASPIRIN 81 MG PO CHEW
324.0000 mg | CHEWABLE_TABLET | Freq: Once | ORAL | Status: AC
  Administered 2021-10-10: 324 mg via ORAL
  Filled 2021-10-10: qty 4

## 2021-10-10 MED ORDER — POLYETHYLENE GLYCOL 3350 17 G PO PACK
17.0000 g | PACK | Freq: Every day | ORAL | Status: DC | PRN
  Administered 2021-10-21: 17 g via ORAL
  Filled 2021-10-10: qty 1

## 2021-10-10 MED ORDER — SODIUM CHLORIDE 0.9 % IV SOLN
2.0000 g | INTRAVENOUS | Status: DC
  Administered 2021-10-10: 2 g via INTRAVENOUS
  Filled 2021-10-10: qty 20

## 2021-10-10 MED ORDER — HYDROCORTISONE SOD SUC (PF) 100 MG IJ SOLR
50.0000 mg | Freq: Four times a day (QID) | INTRAMUSCULAR | Status: DC
  Administered 2021-10-10 – 2021-10-11 (×5): 50 mg via INTRAVENOUS
  Filled 2021-10-10 (×5): qty 2

## 2021-10-10 MED ORDER — SODIUM CHLORIDE 0.9 % IV SOLN: INTRAVENOUS | Status: DC | PRN

## 2021-10-10 MED ORDER — SODIUM BICARBONATE 8.4 % IV SOLN
INTRAVENOUS | Status: DC
  Filled 2021-10-10: qty 150

## 2021-10-10 MED ORDER — PIPERACILLIN-TAZOBACTAM 3.375 G IVPB 30 MIN
3.3750 g | Freq: Once | INTRAVENOUS | Status: AC
  Administered 2021-10-10: 3.375 g via INTRAVENOUS
  Filled 2021-10-10: qty 50

## 2021-10-10 MED ORDER — FAMOTIDINE 20 MG PO TABS
20.0000 mg | ORAL_TABLET | Freq: Every day | ORAL | Status: DC
  Administered 2021-10-10 – 2021-10-16 (×7): 20 mg via ORAL
  Filled 2021-10-10 (×7): qty 1

## 2021-10-10 MED ORDER — HEPARIN (PORCINE) 25000 UT/250ML-% IV SOLN
850.0000 [IU]/h | INTRAVENOUS | Status: DC
  Administered 2021-10-10: 650 [IU]/h via INTRAVENOUS
  Administered 2021-10-11: 750 [IU]/h via INTRAVENOUS
  Administered 2021-10-13: 850 [IU]/h via INTRAVENOUS
  Filled 2021-10-10 (×4): qty 250

## 2021-10-10 MED ORDER — CHLORHEXIDINE GLUCONATE CLOTH 2 % EX PADS
6.0000 | MEDICATED_PAD | Freq: Every day | CUTANEOUS | Status: DC
  Administered 2021-10-10 – 2021-10-19 (×9): 6 via TOPICAL

## 2021-10-10 MED ORDER — SODIUM CHLORIDE 0.9 % IV SOLN
250.0000 mL | INTRAVENOUS | Status: DC
  Administered 2021-10-13: 250 mL via INTRAVENOUS

## 2021-10-10 MED ORDER — PIPERACILLIN-TAZOBACTAM IN DEX 2-0.25 GM/50ML IV SOLN
2.2500 g | Freq: Once | INTRAVENOUS | Status: DC
  Filled 2021-10-10: qty 50

## 2021-10-10 MED ORDER — SODIUM CHLORIDE 0.9 % IV BOLUS
500.0000 mL | Freq: Once | INTRAVENOUS | Status: AC
  Administered 2021-10-10: 500 mL via INTRAVENOUS

## 2021-10-10 MED ORDER — ORAL CARE MOUTH RINSE
15.0000 mL | Freq: Two times a day (BID) | OROMUCOSAL | Status: DC
  Administered 2021-10-10 – 2021-10-24 (×20): 15 mL via OROMUCOSAL

## 2021-10-10 MED ORDER — LIDOCAINE HCL 1 % IJ SOLN
INTRAMUSCULAR | Status: AC
  Administered 2021-10-10: 5 mL
  Filled 2021-10-10: qty 20

## 2021-10-10 MED ORDER — DOCUSATE SODIUM 100 MG PO CAPS: 100.0000 mg | ORAL_CAPSULE | Freq: Two times a day (BID) | ORAL | Status: DC | PRN

## 2021-10-10 MED ORDER — HEPARIN BOLUS VIA INFUSION
3300.0000 [IU] | Freq: Once | INTRAVENOUS | Status: AC
  Administered 2021-10-10: 3300 [IU] via INTRAVENOUS
  Filled 2021-10-10: qty 3300

## 2021-10-10 NOTE — Progress Notes (Signed)
Notified Lab that ABG being sent for analysis. 

## 2021-10-10 NOTE — Progress Notes (Signed)
Bilateral lower extremity venous duplex completed. Refer to "CV Proc" under chart review to view preliminary results.  10/10/2021 2:37 PM Kelby Aline., MHA, RVT, RDCS, RDMS

## 2021-10-10 NOTE — Progress Notes (Signed)
Noted that patient's blood sugar on admission was 322 mg/dl. Past lab glucose from other admissions were within normal limits.   Recommend checking blood sugars every 4 hours while NPO and if blood sugars continue to be greater than 180 mg/dl, recommend starting Novolog SENSITIVE correction scale every 4 hours while NPO and then TID when eating. Recommend checking hgbA1C.   Harvel Ricks RN BSN CDE Diabetes Coordinator Pager: [phone]  8am-5pm

## 2021-10-10 NOTE — ED Provider Notes (Signed)
[facility]-EMERGENCY DEPT Provider Note   CSN: [id number] Arrival date & time: 10/10/21  1851     History Chief Complaint  Patient presents with   Shortness of Breath    Jillian Cooley is a 87 y.o. female.  Patient presents to the emergency department for evaluation of shortness of breath.  Patient reportedly woke up this morning to go to a doctor's appointment and noted that she was short of breath.  Husband reports that he found her bent over and gasping, having difficulty breathing.  EMS report that she was on 4 L of nasal cannula when they arrived on the scene and still was 85%.  Patient was placed on a nonrebreather and has gotten as high as 93%.  She has been tachypneic and in distress during transport.  Patient denies any pain.      Past Medical History:  Diagnosis Date   Abnormal EKG    Carpal tunnel syndrome    CKD (chronic kidney disease), stage III (HCC)    DDD (degenerative disc disease), lumbar    Hypertension    Hypertension with renal disease    Menopause    Mixed hyperlipidemia    Muscular degeneration    Nonrheumatic aortic (valve) insufficiency    Nonrheumatic mitral valve regurgitation    Osteoarthritis    Pedal edema    Pleural effusion    Skin cancer    Vitamin D deficiency     Patient Active Problem List   Diagnosis Date Noted   Multiple myeloma not having achieved remission (HCC) 09/30/2021   Normocytic anemia 09/18/2021   Monoclonal gammopathy 09/18/2021   Pleural effusion    Absolute anemia 08/12/2021   Stage 3a chronic kidney disease (HCC) 08/12/2021   Chronic anticoagulation 08/12/2021   Stage 3b chronic kidney disease (HCC) 06/26/2021   Bilateral pulmonary embolism (HCC) 06/18/2021   DVT, bilateral lower limbs (HCC) 06/18/2021   S/P right rotator cuff repair 04/23/2021   S/P arthroscopy of shoulder 04/23/2021   CHF (congestive heart failure) (HCC)    Nonrheumatic mitral valve regurgitation 02/21/2021    Nonrheumatic tricuspid valve regurgitation 02/21/2021   Nonrheumatic aortic valve insufficiency 02/21/2021   Preoperative cardiovascular examination 02/21/2021   Essential hypertension 02/21/2021   Herniated nucleus pulposus, lumbar 06/13/2012    Past Surgical History:  Procedure Laterality Date   ANTERIOR CERVICAL DECOMP/DISCECTOMY FUSION  2009   CARPAL TUNNEL RELEASE Bilateral    IR THORACENTESIS ASP PLEURAL SPACE W/IMG GUIDE  04/25/2021   LUMBAR LAMINECTOMY/DECOMPRESSION MICRODISCECTOMY  06/13/2012   Procedure: LUMBAR LAMINECTOMY/DECOMPRESSION MICRODISCECTOMY 1 LEVEL;  Surgeon: Cristi Loron, MD;  Location: [facility];  Service: Neurosurgery;  Laterality: Right;  RIGHT Lumbar two-three diskectomy   SHOULDER ARTHROSCOPY WITH ROTATOR CUFF REPAIR AND SUBACROMIAL DECOMPRESSION Right 04/23/2021   Procedure: SHOULDER ARTHROSCOPY WITH ROTATOR CUFF REPAIR AND SUBACROMIAL DECOMPRESSION;  Surgeon: Frederico Hamman, MD;  Location: [facility];  Service: Orthopedics;  Laterality: Right;   THORACENTESIS N/A 09/10/2021   Procedure: Alanson Puls;  Surgeon: Karren Burly, MD;  Location: [facility];  Service: Pulmonary;  Laterality: N/A;     OB History   No obstetric history on file.     Family History  Problem Relation Age of Onset   Colon cancer Maternal Grandfather    Breast cancer Neg Hx     Social History   Tobacco Use   Smoking status: Never   Smokeless tobacco: Never  Substance Use Topics   Alcohol use: No  Drug use: No    Home Medications Prior to Admission medications   Medication Sig Start Date End Date Taking? Authorizing Provider  acyclovir (ZOVIRAX) 400 MG tablet Take 1 tablet (400 mg total) by mouth 2 (two) times daily. 09/30/21   Orson Slick, MD  amLODipine (NORVASC) 5 MG tablet Take 5 mg by mouth in the morning. 11/08/19   [provider]  Calcium Carb-Cholecalciferol (CALTRATE 600+D3 PO) Take 1 tablet by mouth daily with  breakfast.    [provider]  dexamethasone (DECADRON) 4 MG tablet Take 5 tablets (20 mg total) by mouth once a week. 09/30/21   Orson Slick, MD  ELIQUIS 5 MG TABS tablet TAKE 1 TABLET BY MOUTH TWICE A DAY 10/09/21   Dede Query T, PA-C  ferrous sulfate 325 (65 FE) MG EC tablet Take 1 tablet (325 mg total) by mouth daily with breakfast. 07/02/21   Lincoln Brigham, PA-C  furosemide (LASIX) 40 MG tablet Take 1 tablet (40 mg total) by mouth 2 (two) times daily. Patient taking differently: Take 20-40 mg by mouth See admin instructions. Take 20 mg twice daily on Tues and Thurs, Take 40 mg twice daily on Sun, Mon, Wed, Fri, and Sat 07/08/21   Werner Lean, MD  HYDROcodone-acetaminophen (NORCO/VICODIN) 5-325 MG tablet Take 1 tablet by mouth every 4 (four) hours as needed for moderate pain (may need 1-2 first few days followin surgery max 8 tabs in 24hrs). 04/23/21 04/23/22  Chadwell, Vonna Kotyk, PA-C  losartan (COZAAR) 25 MG tablet Take 25 mg by mouth in the morning. 12/16/19   [provider]  Multiple Vitamins-Minerals (PRESERVISION AREDS 2+MULTI VIT PO) Take 1 tablet by mouth 2 (two) times daily.    [provider]  ondansetron (ZOFRAN) 8 MG tablet Take 1 tablet (8 mg total) by mouth every 8 (eight) hours as needed. 09/30/21   Orson Slick, MD  prochlorperazine (COMPAZINE) 10 MG tablet Take 1 tablet (10 mg total) by mouth every 6 (six) hours as needed for nausea or vomiting. 09/30/21   Orson Slick, MD  simvastatin (ZOCOR) 20 MG tablet Take 20 mg by mouth every evening. 11/03/20   [provider]  vitamin B-12 (CYANOCOBALAMIN) 1000 MCG tablet Take 1 tablet (1,000 mcg total) by mouth daily. 07/02/21   Lincoln Brigham, PA-C    Allergies    Sulfa antibiotics  Review of Systems   Review of Systems  Respiratory:  Positive for shortness of breath.   All other systems reviewed and are negative.  Physical Exam Updated Vital Signs There were no  vitals taken for this visit.  Physical Exam Vitals and nursing note reviewed.  Constitutional:      General: She is not in acute distress.    Appearance: Normal appearance. She is well-developed.  HENT:     Head: Normocephalic and atraumatic.     Right Ear: Hearing normal.     Left Ear: Hearing normal.     Nose: Nose normal.  Eyes:     Conjunctiva/sclera: Conjunctivae normal.     Pupils: Pupils are equal, round, and reactive to light.  Cardiovascular:     Rate and Rhythm: Regular rhythm.     Heart sounds: S1 normal and S2 normal. No murmur heard.   No friction rub. No gallop.  Pulmonary:     Effort: Tachypnea and accessory muscle usage present.     Breath sounds: Examination of the right-upper field reveals decreased breath sounds.  Examination of the right-middle field reveals decreased breath sounds. Examination of the right-lower field reveals decreased breath sounds. Decreased breath sounds present.  Chest:     Chest wall: No tenderness.  Abdominal:     General: Bowel sounds are normal.     Palpations: Abdomen is soft.     Tenderness: There is no abdominal tenderness. There is no guarding or rebound. Negative signs include Murphy's sign and McBurney's sign.     Hernia: No hernia is present.  Musculoskeletal:        General: Normal range of motion.     Cervical back: Normal range of motion and neck supple.     Right lower leg: 3+ Edema present.     Left lower leg: 3+ Edema present.  Skin:    General: Skin is warm and dry.     Findings: No rash.  Neurological:     Mental Status: She is alert and oriented to person, place, and time.     GCS: GCS eye subscore is 4. GCS verbal subscore is 5. GCS motor subscore is 6.     Cranial Nerves: No cranial nerve deficit.     Sensory: No sensory deficit.     Coordination: Coordination normal.  Psychiatric:        Speech: Speech normal.        Behavior: Behavior normal.        Thought Content: Thought content normal.    ED Results  / Procedures / Treatments   Labs (all labs ordered are listed, but only abnormal results are displayed) Labs Reviewed  CBC WITH DIFFERENTIAL/PLATELET  COMPREHENSIVE METABOLIC PANEL  BRAIN NATRIURETIC PEPTIDE  LACTIC ACID, PLASMA  BLOOD GAS, ARTERIAL  PROTIME-INR  TROPONIN I (HIGH SENSITIVITY)    EKG EKG Interpretation  Date/Time:  Friday October 10 2021 06:54:40 EDT Ventricular Rate:  86 PR Interval:  132 QRS Duration: 114 QT Interval:  355 QTC Calculation: 425 R Axis:   -32 Text Interpretation: Sinus rhythm Atrial premature complex Incomplete left bundle branch block LVH with secondary repolarization abnormality Inferior infarct, old Anterior Q waves, possibly due to LVH Confirmed by Orpah Greek [phone]) on 10/10/2021 7:11:19 AM  Radiology No results found.  Procedures Procedures   Medications Ordered in ED Medications - No data to display  ED Course  I have reviewed the triage vital signs and the nursing notes.  Pertinent labs & imaging results that were available during my care of the patient were reviewed by me and considered in my medical decision making (see chart for details).    MDM Rules/Calculators/A&P                           Patient presents to the emergency department for evaluation of respiratory distress.  Patient reports waking up short of breath today and it rapidly worsened over the course of the morning.  Patient hypoxic.  She has somewhat compensated on nonrebreather but is still in some respiratory distress.  Portable chest x-ray at arrival reveals large left pleural effusion.  Will place on BiPAP.  Remainder of work-up initiated.  Will sign to oncoming ER physician to follow-up and disposition.  CRITICAL CARE Performed by: Orpah Greek   Total critical care time: 31 minutes  Critical care time was exclusive of separately billable procedures and treating other patients.  Critical care was necessary to treat or prevent  imminent or life-threatening deterioration.  Critical care was time spent personally  by me on the following activities: development of treatment plan with patient and/or surrogate as well as nursing, discussions with consultants, evaluation of patient's response to treatment, examination of patient, obtaining history from patient or surrogate, ordering and performing treatments and interventions, ordering and review of laboratory studies, ordering and review of radiographic studies, pulse oximetry and re-evaluation of patient's condition.   Final Clinical Impression(s) / ED Diagnoses Final diagnoses:  Acute respiratory failure, unspecified whether with hypoxia or hypercapnia (HCC)  Pleural effusion on left    Rx / DC Orders ED Discharge Orders     None        Vitaliy Eisenhour, Gwenyth Allegra, MD 10/10/21 [phone]

## 2021-10-10 NOTE — H&P (Signed)
NAME:  Jillian Cooley, MRN:  [id number], DOB:  02/03/1932, LOS: 0 ADMISSION DATE:  10/10/2021, CONSULTATION DATE:  10/10/2021  REFERRING MD:  Dr Marilynne Drivers ER, CHIEF COMPLAINT:  resp failure, large left effusion and metabolic acidosis   History of Present Illness:  Background hx as of 09/30/21  Hematological/Oncological History #Bilateral DVT and PE: 1) 04/23/2021-04/26/2021: Admitted for right rotator cuff repair and found to have acute hypoxia.  Chest xray found small left pleural effusion with associated atelectasis and trace right pleural effusion and right basilar airspace disease. Underwent left thoracentesis with removal of 800 cc. Due to persistent hypoxia, patient underwent NM pulmonary perfusion study that confirmed bilateral pulmonary emboli. Doppler US revealed bilateral acute DVT involving the right soleal veins and left posterior tibial veins. Patient was started on Eliquis.  2)  06/17/2021: Establish care with Georga Kaufmann PA-C and recommend Eliquis x 6 months.    #Normocytic Anemia: 1) 06/17/2021: Labs show anemia with Hgb 10.4, MCV 90.1 2) 06/27/2021: Labs show iron deficiency with serum iron 33, iron saturation 13%; vitamin B12 deficiency with vitamin B12 at 266 and elevated MMA at 410. Folate level normal. Recommended oral ferrous sulfate 325 mg once daily and vitamin b12 1000 mcg once daily.    #Multiple Myeloma:significant for Kappa light chain multiple myeloma  1) 09/04/2021: Labs from [facility] -TIBC 228 (L), Iron 64, Iron saturation 28%, Ferritin 216 (H), Creatinine 1.96 (H), BUN 51 (H), Calcium 10.0, -CBC: Hgb 11.2,  -SPEP revealed M-Spike 0.3 with immunofixation that showed monoclonal free kappa light chain.  -sFLC: Free Kappa Lt Chain: 4504.5 (H), Free Lambda Lt Chain: 19.7, Kappa/Lambda Ratio: 228.65 (H) 09/24/2021: bone marrow biopsy showed hypercellular bone marrow for age with plasma cell neoplasm with plasma cells representing 25% of for  cells in the aspirate    September 30, 202 seen by by pulmonary critical care service: -?  Outpatient admitted for worsening dyspnea and found to have significant left pleural effusion [given back in January 2022 she had left greater than right pleural effusion].  Echocardiogram from May 2022 showed grade 2 diastolic dysfunction with significant elevation in pulmonary artery pressures.  Mild pericardial effusion +.  And mild to moderate arctic regurgitation.  Thoracentesis showed transudate.  Cytology nondiagnostic for malignant cells [prior thoracentesis in February 2022 with similar results]   09/30/21 oncology clinic  continues to have issues with shortness of breath and low energy.  She was prescribed oxygen which she does occasionally use at home.  She notes that she has not been having any issues with fevers, chills, sweats, nausea, vomiting or diarrhea.  Her health is otherwise been stable  10/10/2021 - ER -scheduled for chemotherapy today but present with worsening hypoxemia.  Husband found her pulse ox in the 80s despite her 4 L nasal cannula.  In the ER started on BiPAP.  Blood pressures been intermittently soft.  Performed metabolic acidosis with a pH of 7.1 and a lactic acid of 4.  Blood pressure soft but responded to fluid bolus.  Echocardiogram being done in the ER with provisional technician reporting "ejection fraction down].  Chest x-ray with large left-sided pleural effusion.  Also having acute on chronic kidney injury [baseline creatinine 1.84 mg percent] current creatinine 2.14 mg percent.  She has been started on BiPAP.  Also troponin increased from 500-800 with BNP of 1000.  IV heparin has been started by the ER.  Baseline albumin appears to be 3.5-3.4 suggesting mild protein calorie malnutrition.  Also  some amount of failure to thrive   She has a living will that asked for no resuscitation of persistent vegetative state incurable and terminal illness but in talking to her CODE  STATUS currently is full code although she is reflecting on her decisions about intubation and CPR.   Results for MAN, BONNEAU (MRN [id number]) as of 10/10/2021 13:31  Ref. Range 02/28/2021 11:09 04/23/2021 18:59 04/23/2021 21:08 04/24/2021 09:20 07/07/2021 15:00 09/16/2021 14:32 10/10/2021 07:31 10/10/2021 09:35  B Natriuretic Peptide Latest Ref Range: 0.0 - 100.0 pg/mL    528.9 (H)   1,070.9 (H)   LDH Latest Ref Range: 98 - 192 U/L      177    NT-Pro BNP Latest Ref Range: 0 - 738 pg/mL 1,545 (H)    3,761 (H)     Troponin I (High Sensitivity) Latest Ref Range: <18 ng/L  28 (H) 29 (H)    523 (HH) 836 (HH)   Pertinent  Medical History    has a past medical history of Abnormal EKG, Carpal tunnel syndrome, CKD (chronic kidney disease), stage III (HCC), DDD (degenerative disc disease), lumbar, Hypertension, Hypertension with renal disease, Menopause, Mixed hyperlipidemia, Muscular degeneration, Nonrheumatic aortic (valve) insufficiency, Nonrheumatic mitral valve regurgitation, Osteoarthritis, Pedal edema, Pleural effusion, Skin cancer, and Vitamin D deficiency.   reports that she has never smoked. She has never used smokeless tobacco.  Past Surgical History:  Procedure Laterality Date   ANTERIOR CERVICAL DECOMP/DISCECTOMY FUSION  2009   CARPAL TUNNEL RELEASE Bilateral    IR THORACENTESIS ASP PLEURAL SPACE W/IMG GUIDE  04/25/2021   LUMBAR LAMINECTOMY/DECOMPRESSION MICRODISCECTOMY  06/13/2012   Procedure: LUMBAR LAMINECTOMY/DECOMPRESSION MICRODISCECTOMY 1 LEVEL;  Surgeon: Ophelia Charter, MD;  Location: [facility];  Service: Neurosurgery;  Laterality: Right;  RIGHT Lumbar two-three diskectomy   SHOULDER ARTHROSCOPY WITH ROTATOR CUFF REPAIR AND SUBACROMIAL DECOMPRESSION Right 04/23/2021   Procedure: SHOULDER ARTHROSCOPY WITH ROTATOR CUFF REPAIR AND SUBACROMIAL DECOMPRESSION;  Surgeon: Earlie Server, MD;  Location: [facility];  Service: Orthopedics;  Laterality: Right;    THORACENTESIS N/A 09/10/2021   Procedure: Mathews Robinsons;  Surgeon: Lanier Clam, MD;  Location: [facility];  Service: Pulmonary;  Laterality: N/A;    Allergies  Allergen Reactions   Sulfa Antibiotics Nausea Only    Immunization History  Administered Date(s) Administered   Influenza-Unspecified 10/21/2018   PFIZER(Purple Top)SARS-COV-2 Vaccination 01/09/2020, 01/29/2020, 10/01/2020    Family History  Problem Relation Age of Onset   Colon cancer Maternal Grandfather    Breast cancer Neg Hx      Current Facility-Administered Medications:    heparin ADULT infusion 100 units/mL (25000 units/277mL), 650 Units/hr, Intravenous, Continuous, Shade, Christine E, RPH, Last Rate: 6.5 mL/hr at 10/10/21 1152, 650 Units/hr at 10/10/21 1152   lidocaine (XYLOCAINE) 1 % (with pres) injection, , , ,   Current Outpatient Medications:    Calcium Carb-Cholecalciferol (CALTRATE 600+D3 PO), Take 1 tablet by mouth daily with breakfast., Disp: , Rfl:    dexamethasone (DECADRON) 4 MG tablet, Take 5 tablets (20 mg total) by mouth once a week., Disp: 20 tablet, Rfl: 3   ELIQUIS 5 MG TABS tablet, TAKE 1 TABLET BY MOUTH TWICE A DAY, Disp: 60 tablet, Rfl: 3   ferrous sulfate 325 (65 FE) MG EC tablet, Take 1 tablet (325 mg total) by mouth daily with breakfast., Disp: 30 tablet, Rfl: 3   furosemide (LASIX) 40 MG tablet, Take 1 tablet (40 mg total) by mouth 2 (two) times daily. (Patient taking differently: Take  20-40 mg by mouth See admin instructions. Take 20 mg twice daily on Tues and Thurs, Take 40 mg twice daily on Sun, Mon, Wed, Fri, and Sat), Disp: 90 tablet, Rfl: 3   HYDROcodone-acetaminophen (NORCO/VICODIN) 5-325 MG tablet, Take 1 tablet by mouth every 4 (four) hours as needed for moderate pain (may need 1-2 first few days followin surgery max 8 tabs in 24hrs)., Disp: 40 tablet, Rfl: 0   losartan (COZAAR) 25 MG tablet, Take 25 mg by mouth in the morning., Disp: , Rfl:    Multiple Vitamins-Minerals  (PRESERVISION AREDS 2+MULTI VIT PO), Take 1 tablet by mouth 2 (two) times daily., Disp: , Rfl:    ondansetron (ZOFRAN) 8 MG tablet, Take 1 tablet (8 mg total) by mouth every 8 (eight) hours as needed., Disp: 30 tablet, Rfl: 0   prochlorperazine (COMPAZINE) 10 MG tablet, Take 1 tablet (10 mg total) by mouth every 6 (six) hours as needed for nausea or vomiting., Disp: 30 tablet, Rfl: 0   simvastatin (ZOCOR) 20 MG tablet, Take 20 mg by mouth every evening., Disp: , Rfl:    vitamin B-12 (CYANOCOBALAMIN) 1000 MCG tablet, Take 1 tablet (1,000 mcg total) by mouth daily., Disp: 30 tablet, Rfl: 3   acyclovir (ZOVIRAX) 400 MG tablet, Take 1 tablet (400 mg total) by mouth 2 (two) times daily., Disp: 60 tablet, Rfl: 3   amLODipine (NORVASC) 5 MG tablet, Take 5 mg by mouth in the morning., Disp: , Rfl:    Significant Hospital Events: Including procedures, antibiotic start and stop dates in addition to other pertinent events   10/10/2021 - admit  Interim History / Subjective:  10/10/2021  - seen in [facility] 12  Objective   Blood pressure 94/75, pulse 100, temperature (!) 97 F (36.1 C), temperature source Rectal, resp. rate (!) 27, SpO2 100 %.        Intake/Output Summary (Last 24 hours) at 10/10/2021 1357 Last data filed at 10/10/2021 1256 Gross per 24 hour  Intake 600 ml  Output --  Net 600 ml   There were no vitals filed for this visit.  Examination: General: Frail elderly female in the ER at bed [place].  Looks critically ill HENT: No neck nodes.  On BiPAP. Lungs: Diminished air entry on the left side Cardiovascular: Tachycardic.  Mean arterial pressure 73 systolic blood pressure in the 80s Abdomen: Soft nontender no organomegaly scaphoid Extremities: Chronic venous stasis edema present Neuro: Alert and oriented x3 GU: Not examined    LABS   Results for FERRIS, TALLY (MRN [id number]) as of 10/10/2021 13:31  Ref. Range 02/28/2021 11:09 04/23/2021 18:59 04/23/2021 21:08  04/24/2021 09:20 07/07/2021 15:00 09/16/2021 14:32 10/10/2021 07:31 10/10/2021 09:35  B Natriuretic Peptide Latest Ref Range: 0.0 - 100.0 pg/mL    528.9 (H)   1,070.9 (H)   LDH Latest Ref Range: 98 - 192 U/L      177    NT-Pro BNP Latest Ref Range: 0 - 738 pg/mL 1,545 (H)    3,761 (H)     Troponin I (High Sensitivity) Latest Ref Range: <18 ng/L  28 (H) 29 (H)    523 (HH) 836 (HH)   PULMONARY Recent Labs  Lab 10/10/21 0650  PHART 7.103*  PCO2ART 51.8*  PO2ART 89.0  HCO3 15.5*  O2SAT 91.5    CBC Recent Labs  Lab 10/10/21 0731  HGB 11.5*  HCT 37.4  WBC 10.1  PLT 311    COAGULATION Recent Labs  Lab 10/10/21 0731  INR 1.7*    CARDIAC  No results for input(s): TROPONINI in the last 168 hours. No results for input(s): PROBNP in the last 168 hours.   CHEMISTRY Recent Labs  Lab 10/10/21 0731  NA 136  K 4.6  CL 103  CO2 19*  GLUCOSE 322*  BUN 74*  CREATININE 2.43*  CALCIUM 9.6   Estimated Creatinine Clearance: 13.5 mL/min (A) (by C-G formula based on SCr of 2.43 mg/dL (H)).   LIVER Recent Labs  Lab 10/10/21 0731  AST 33  ALT 34  ALKPHOS 56  BILITOT 0.7  PROT 6.9  ALBUMIN 3.8  INR 1.7*     INFECTIOUS Recent Labs  Lab 10/10/21 0731  LATICACIDVEN 4.3*     ENDOCRINE CBG (last 3)  No results for input(s): GLUCAP in the last 72 hours.       IMAGING x48h  - image(s) personally visualized  -   highlighted in bold DG Chest Portable 1 View  Result Date: 10/10/2021 CLINICAL DATA:  Increased short of breath EXAM: PORTABLE CHEST 1 VIEW COMPARISON:  None. FINDINGS: Cardiac silhouette is obscured by a large LEFT pleural effusion. Effusion is increased significantly from comparison exam and now occupies 2/3 of the LEFT hemithorax. Small RIGHT effusion is also increased. Mild airspace disease in RIGHT lower lobe. IMPRESSION: 1. Markedly increased large LEFT pleural effusion. 2. Small RIGHT effusion and probable RIGHT lower lobe edema. Electronically Signed    By: Suzy Bouchard M.D.   On: 10/10/2021 07:36     Resolved Hospital Problem list   x  Assessment & Plan:  ASSESSMENT / PLAN:  PULMONARY  A:  History of DVT and PE in May 2022 -prior to admission:?  Currently on Eliquis Chronic recurrent left pleural effusion transudative  -February 2022, May 2022 in September 2022  Acute on chronic hypoxemic respiratory failure severe requiring BiPAP: Present on admission Large recurrent pleural effusion: Present on admission  10/10/2021 -> on BiPAP in the ER but was able to take and talk to me for a few minutes.  Large left pleural effusion contributing to respiratory distress  P:   BiPAP nightly and continuous and then reassess 10/11/2021 IR guided thoracentesis of the left side given respiratory distress and tenuous state [discussed with Dr. Pascal Lux IV heparin being given for cardiac reasons [see below] Recheck duplex lower extremity   NEUROLOGIC A:   ?  On chronic chronic opioids  Normal mental status at admission but at risk for encephalopathy  P:   Monitor    VASCULAR A:   Hypotension at admission -responded to fluid bolus ?  Cardiac etiology versus septic versus relative adrenal insufficiency [med list shows Decadron 20 mg once a week] at admission  P:  Mean artery pressure greater than 65 Hydrocortisone stress dose    CARDIAC STRUCTURAL A: Hypertension - Prior to & Present on Admit.  On amlodipine at home and Lasix  Grade 2 diastolic dysfunction with severe elevation pulmonary artery pressures -May 2022- Prior to & Present on Admit  Currently: Acute on chronic diastolic dysfunction and severe mitral regurgitation with severe elevation pulmonary artery pressures and left atrial dilatation -present on admission [similar to prior echo in May 2022]  Concern for non-STEMI present on admission  P: IV heparin Cardiology consult required Might need right heart catheterization given valvular issues and recurrent left  pleural effusion.  CARDIAC ELECTRICAL A: Sinus tach   P: Telemetry monitoring  INFECTIOUS A:   Preadmission acyclovir ?  Indication At risk for  sepsis but no clear-cut evidence of sepsis P:   Check procalcitonin Check respiratory virus panel Check urine strep Check urine Legionella Empiric CAP coverage but will have low threshold to stop Hold acyclovir till renal failure improves -we will discuss with pharmacy about restart  RENAL A:  Chronic kidney disease secondary to myeloma Acute on chronic kidney injury with significant prerenal component  P:  Avoid nephrotoxins Monitor with maintaining hemodynamics  ELECTROLYTES A:  At risk for electrolyte imbalance during the hospitalization P: Monitor and replete   Acid-base  A: Severe lactic acidosis and metabolic acidosis at admission.?  Due to cardiac versus renal injury for myeloma versus sepsis  Plan  - Start bicarbonate infusion -Recheck ABG  GASTROINTESTINAL A:   Normal liver function test at admission  P:   N.p.o. PPI  HEMATOLOGIC   - HEME A:  Mild anemia present on admission hemoglobin 11.5   P:  - PRBC for hgb </= 6.9gm%    - exceptions are   -  if ACS susepcted/confirmed then transfuse for hgb </= 8.0gm%,  or    -  active bleeding with hemodynamic instability, then transfuse regardless of hemoglobin value   At at all times try to transfuse 1 unit prbc as possible with exception of active hemorrhage   HEMATOLOGIC - Platelets A Normal platelets  P IV heparin [monitor for thrombocytopenia]  Oncology  Multiple myeloma - Prior to & Present on Admit.  Untreated.  Was due to start chemotherapy 10/10/2021.  Sees [name]   Plan  - No chemo given current critically ill state  ENDOCRINE A:   At risk for hypo and hyperglycemia P:   SSI  MSK/DERM Failure to thrive - Prior to & Present on Admit Mild protein calorie malnutrition - Prior to & Present on Admit  Plan  - Nutrition  consult   Best Practice (right click and "Reselect all SmartList Selections" daily)   Diet/type: NPO w/ oral meds DVT prophylaxis: systemic heparin GI prophylaxis: H2B Lines: N/A Foley:  N/A Code Status:  full code Last date of multidisciplinary goals of care discussion [detail multidisciplinary goals of care held in the presence of ER RN Elmyra Ricks and daughter and patient.  As she has a living will where she does not want to be resuscitated but only kept comfortable in the case of incurable or terminal illness or persistent vegetative state.  I saw this document.  However the current condition is that she is critically ill.  She is opted for full medical care and initially said not intubate but after further reflection she wants to be intubated for the short-term and also have CPR.  However she wants to reflect on all this.  Her decline is currently a little bit of an emotional shock to her]      ATTESTATION & SIGNATURE   The patient Jossette Zirbel is critically ill with multiple organ systems failure and requires high complexity decision making for assessment and support, frequent evaluation and titration of therapies, application of advanced monitoring technologies and extensive interpretation of multiple databases.   Critical Care Time devoted to patient care services described in this note is  75  Minutes. This time reflects time of care of this signee Dr Brand Males. This critical care time does not reflect procedure time, or teaching time or supervisory time of PA/NP/Med student/Med Resident etc but could involve care discussion time     Dr. Brand Males, M.D., [facility]C.P Pulmonary and Critical Care Medicine Staff Physician  [facility] Pager: [phone], If no answer or between  15:00h - 7:00h: call [phone]  10/10/2021 1:57 PM    LABS    PULMONARY Recent Labs  Lab 10/10/21 0650  PHART 7.103*  PCO2ART  51.8*  PO2ART 89.0  HCO3 15.5*  O2SAT 91.5    CBC Recent Labs  Lab 10/10/21 0731  HGB 11.5*  HCT 37.4  WBC 10.1  PLT 311    COAGULATION Recent Labs  Lab 10/10/21 0731  INR 1.7*    CARDIAC  No results for input(s): TROPONINI in the last 168 hours. No results for input(s): PROBNP in the last 168 hours.   CHEMISTRY Recent Labs  Lab 10/10/21 0731  NA 136  K 4.6  CL 103  CO2 19*  GLUCOSE 322*  BUN 74*  CREATININE 2.43*  CALCIUM 9.6   Estimated Creatinine Clearance: 13.5 mL/min (A) (by C-G formula based on SCr of 2.43 mg/dL (H)).   LIVER Recent Labs  Lab 10/10/21 0731  AST 33  ALT 34  ALKPHOS 56  BILITOT 0.7  PROT 6.9  ALBUMIN 3.8  INR 1.7*     INFECTIOUS Recent Labs  Lab 10/10/21 0731  LATICACIDVEN 4.3*     ENDOCRINE CBG (last 3)  No results for input(s): GLUCAP in the last 72 hours.       IMAGING x48h  - image(s) personally visualized  -   highlighted in bold DG Chest Portable 1 View  Result Date: 10/10/2021 CLINICAL DATA:  Increased short of breath EXAM: PORTABLE CHEST 1 VIEW COMPARISON:  None. FINDINGS: Cardiac silhouette is obscured by a large LEFT pleural effusion. Effusion is increased significantly from comparison exam and now occupies 2/3 of the LEFT hemithorax. Small RIGHT effusion is also increased. Mild airspace disease in RIGHT lower lobe. IMPRESSION: 1. Markedly increased large LEFT pleural effusion. 2. Small RIGHT effusion and probable RIGHT lower lobe edema. Electronically Signed   By: Suzy Bouchard M.D.   On: 10/10/2021 07:36

## 2021-10-10 NOTE — Progress Notes (Signed)
  Echocardiogram 2D Echocardiogram has been performed.  Fidel Levy 10/10/2021, 1:12 PM

## 2021-10-10 NOTE — Procedures (Signed)
PROCEDURE SUMMARY:  Successful US guided left thoracentesis. Yielded 1.5 L of clear yellow fluid. Pt tolerated procedure well. No immediate complications.  Specimen was sent for labs. CXR ordered.  EBL < 5 mL  Ascencion Dike PA-C 10/10/2021 2:14 PM

## 2021-10-10 NOTE — Progress Notes (Addendum)
A consult was received from an ED physician for Zosyn per pharmacy dosing.  [name] profile has been reviewed for ht/wt/allergies/indication/available labs.   A one time order has been placed for Zosyn 2.25g.  Further antibiotics/pharmacy consults should be ordered by admitting physician if indicated.                       Thank you, Gretta Arab PharmD, BCPS Clinical Pharmacist [facility] main pharmacy [phone] 10/10/2021 10:11 AM   Addendum: SCr elevated at 2.43 on 10/21 with CrCl ~ 13.5 ml/min Zosyn 2.25g ordered for reduced renal function, but all pharmacy supplies are frozen and unavailable for several hours.   For treatment of sepsis, give Zosyn 3.375g IV for one dose, then re-dose if needed on admission.    Gretta Arab PharmD, BCPS Clinical Pharmacist [facility] main pharmacy [phone] 10/10/2021 10:42 AM

## 2021-10-10 NOTE — ED Notes (Signed)
US at bedside

## 2021-10-10 NOTE — Consult Note (Addendum)
Cardiology Consultation:   Patient ID: Jillian Cooley MRN: [id number]; DOB: 17-May-1932  Admit date: 10/10/2021 Date of Consult: 10/10/2021  PCP:  [name], MD   [facility] Providers Cardiologist:  Werner Lean, MD   {   Patient Profile:   Jillian Cooley is a 87 y.o. female with a hx of hypertension, hyperlipidemia, valvular heart disease (aortic regurgitation, mitral regurgitation and tricuspid regurgitation, chronic kidney disease stage III, carpal tunnel syndrome, pulmonary embolism, multiple myeloma, and pleural effusion s/p thoracentesis who is being seen 10/10/2021 for the evaluation of elevated troponin at the request of Dr. Almyra Free.  04/2021 patient had rotator cuff surgery.  Post surgery found to have bilateral pulmonary embolism, bilateral DVT and decompensated heart failure.  She also underwent thoracentesis for left pleural effusion.   She was recently diagnosed with multiple myeloma and followed by hematology oncology.  She did not felt transplant candidate. Plan to start CyBorD chemotherapy.   History of Present Illness:   Jillian Cooley had worsening dyspnea.  Pt has oxygen at home since her thoracentesis months ago. She uses oxygen PRN.  This morning she was more dyspneic and using 2 to 4 L oxygen.  Despite using her oxygen was staying around 89%.  EMS was called and found hypoxic at 85% on 4 L oxygen.  She was placed on nonrebreather with improvement of oxygenation.  She was tachypneic. Patient is hypotensive.  Patient also reports substernal chest tightness during episode of severe shortness of breath this morning.  Serum creatinine 2.43 BNP 1070 Serum creatinine 523>> 830 Lactic acid 4.3 Respiratory panel negative for influenza and COVID Glucose 322 Pending blood culture  Chest X-ray IMPRESSION: 1. Markedly increased large LEFT pleural effusion. 2. Small RIGHT effusion and probable RIGHT lower lobe edema.  She was given heparin  bolus and now on gtt.  On broad-spectrum antibiotic. Echo done, pending reading.  She is on BiPAP.  Son at bedside.  Past Medical History:  Diagnosis Date   Abnormal EKG    Carpal tunnel syndrome    CKD (chronic kidney disease), stage III (HCC)    DDD (degenerative disc disease), lumbar    Hypertension    Hypertension with renal disease    Menopause    Mixed hyperlipidemia    Muscular degeneration    Nonrheumatic aortic (valve) insufficiency    Nonrheumatic mitral valve regurgitation    Osteoarthritis    Pedal edema    Pleural effusion    Skin cancer    Vitamin D deficiency     Past Surgical History:  Procedure Laterality Date   ANTERIOR CERVICAL DECOMP/DISCECTOMY FUSION  2009   CARPAL TUNNEL RELEASE Bilateral    IR THORACENTESIS ASP PLEURAL SPACE W/IMG GUIDE  04/25/2021   LUMBAR LAMINECTOMY/DECOMPRESSION MICRODISCECTOMY  06/13/2012   Procedure: LUMBAR LAMINECTOMY/DECOMPRESSION MICRODISCECTOMY 1 LEVEL;  Surgeon: Ophelia Charter, MD;  Location: [facility];  Service: Neurosurgery;  Laterality: Right;  RIGHT Lumbar two-three diskectomy   SHOULDER ARTHROSCOPY WITH ROTATOR CUFF REPAIR AND SUBACROMIAL DECOMPRESSION Right 04/23/2021   Procedure: SHOULDER ARTHROSCOPY WITH ROTATOR CUFF REPAIR AND SUBACROMIAL DECOMPRESSION;  Surgeon: Earlie Server, MD;  Location: [facility];  Service: Orthopedics;  Laterality: Right;   THORACENTESIS N/A 09/10/2021   Procedure: Mathews Robinsons;  Surgeon: Lanier Clam, MD;  Location: [facility] ENDOSCOPY;  Service: Pulmonary;  Laterality: N/A;    Inpatient Medications: Scheduled Meds:  lidocaine       Continuous Infusions:  heparin 650 Units/hr (10/10/21 1152)   PRN Meds:  Allergies:    Allergies  Allergen Reactions   Sulfa Antibiotics Nausea Only   Social History:   Social History   Socioeconomic History   Marital status: Married    Spouse name: Not on file   Number of children: Not on file   Years of education: Not on file    Highest education level: Not on file  Occupational History   Not on file  Tobacco Use   Smoking status: Never   Smokeless tobacco: Never  Substance and Sexual Activity   Alcohol use: No   Drug use: No   Sexual activity: Not on file  Other Topics Concern   Not on file  Social History Narrative   Not on file   Social Determinants of Health   Financial Resource Strain: Not on file  Food Insecurity: Not on file  Transportation Needs: Not on file  Physical Activity: Not on file  Stress: Not on file  Social Connections: Not on file  Intimate Partner Violence: Not on file    Family History:    Family History  Problem Relation Age of Onset   Colon cancer Maternal Grandfather    Breast cancer Neg Hx      ROS:  Please see the history of present illness.  All other ROS reviewed and negative.     Physical Exam/Data:   Vitals:   10/10/21 1230 10/10/21 1245 10/10/21 1300 10/10/21 1330  BP: (!) 86/64 (!) 87/72 91/82 103/75  Pulse: 94 99 94 100  Resp: 16 (!) 23 18 (!) 27  Temp:      TempSrc:      SpO2: 100% 100% 100% 100%    Intake/Output Summary (Last 24 hours) at 10/10/2021 1349 Last data filed at 10/10/2021 1256 Gross per 24 hour  Intake 600 ml  Output --  Net 600 ml   Last 3 Weights 09/30/2021 09/16/2021 09/02/2021  Weight (lbs) 125 lb 1.6 oz 126 lb 8 oz 125 lb 3.2 oz  Weight (kg) 56.745 kg 57.38 kg 56.79 kg     There is no height or weight on file to calculate BMI.  General: Ill-appearing female on BiPAP  HEENT: normal Neck: no JVD Vascular: No carotid bruits; Distal pulses 2+ bilaterally Cardiac:  normal S1, S2; irregular, positive murmur Lungs:  diminished Abd: soft, nontender, no hepatomegaly  Ext: +edema Musculoskeletal:  No deformities, BUE and BLE strength normal and equal Skin: warm and dry  Neuro:  CNs 2-12 intact, no focal abnormalities noted Psych:  Normal affect   EKG:  The EKG was personally reviewed and demonstrates:  SR, PAC,  ILBBB Telemetry:  Telemetry was personally reviewed and demonstrates: Sinus rhythm versus atrial fibrillation/flutter, patient has artifact  Relevant CV Studies:  Echo 04/24/2021  1. Right ventricular systolic function is normal. The right ventricular  size is normal. There is severely elevated pulmonary artery systolic  pressure. The estimated right ventricular systolic pressure is 81.8 mmHg.   2. Left ventricular ejection fraction, by estimation, is 65 to 70%. The  left ventricle has normal function. The left ventricle has no regional  wall motion abnormalities. Left ventricular diastolic parameters are  consistent with Grade II diastolic  dysfunction (pseudonormalization). Elevated left atrial pressure.   3. Left atrial size was moderately dilated.   4. A small pericardial effusion is present. There is no evidence of  cardiac tamponade. Moderate pleural effusion.   5. The mitral valve is grossly normal. Moderate mitral valve  regurgitation; may be underestimated in this study.  6. The aortic valve is tricuspid. Aortic valve regurgitation is mild to  moderate and eccentric. Mild to moderate aortic valve  sclerosis/calcification is present, without any evidence of aortic  stenosis.   7. The inferior vena cava is dilated in size with <50% respiratory  variability, suggesting right atrial pressure of 15 mmHg.   Laboratory Data:  High Sensitivity Troponin:   Recent Labs  Lab 10/10/21 0731 10/10/21 0935  TROPONINIHS 523* 836*     Chemistry Recent Labs  Lab 10/10/21 0731  NA 136  K 4.6  CL 103  CO2 19*  GLUCOSE 322*  BUN 74*  CREATININE 2.43*  CALCIUM 9.6  GFRNONAA 19*  ANIONGAP 14    Recent Labs  Lab 10/10/21 0731  PROT 6.9  ALBUMIN 3.8  AST 33  ALT 34  ALKPHOS 56  BILITOT 0.7   Lipids No results for input(s): CHOL, TRIG, HDL, LABVLDL, LDLCALC, CHOLHDL in the last 168 hours.  Hematology Recent Labs  Lab 10/10/21 0731  WBC 10.1  RBC 3.89  HGB 11.5*  HCT  37.4  MCV 96.1  MCH 29.6  MCHC 30.7  RDW 14.8  PLT 311   Thyroid No results for input(s): TSH, FREET4 in the last 168 hours.  BNP Recent Labs  Lab 10/10/21 0731  BNP 1,070.9*    DDimer No results for input(s): DDIMER in the last 168 hours.   Radiology/Studies:  DG Chest Portable 1 View  Result Date: 10/10/2021 CLINICAL DATA:  Increased short of breath EXAM: PORTABLE CHEST 1 VIEW COMPARISON:  None. FINDINGS: Cardiac silhouette is obscured by a large LEFT pleural effusion. Effusion is increased significantly from comparison exam and now occupies 2/3 of the LEFT hemithorax. Small RIGHT effusion is also increased. Mild airspace disease in RIGHT lower lobe. IMPRESSION: 1. Markedly increased large LEFT pleural effusion. 2. Small RIGHT effusion and probable RIGHT lower lobe edema. Electronically Signed   By: Suzy Bouchard M.D.   On: 10/10/2021 07:36     Assessment and Plan:   Elevated troponin -Patient had shortness of breath overnight, worsened this morning with associated chest tightness.  High-sensitivity troponin 523>>836.  Currently chest pain-free.  This could be demand in setting of acute hypoxic respiratory failure.  EKG without acute ischemic changes. -Continue IV heparin for now.  She may need thoracentesis and further close evaluation based on echocardiogram result. Pending blood culture.   2.  Acute hypoxic respiratory failure 3.  Large left pleural effusion -Per attending team  4.  Valvular heart disease -Patient with history of moderate mitral regurgitation, mild to moderate aortic regurgitation. -Pending updated echocardiogram  5. Multiple myeloma - Plan was to start  CyBorD chemotherapy today.   6.  Abnormal EKG/telemetry -Continue with showing sinus rhythm but there is a concern for atrial fibrillation/flutter.  However, clearly visible some P waves.  Will review with MD.  7. Acute on CKD IIIb - SCr of 2.43 (baseline around 1.7-1.8).   Dr. Harrell Gave to  see.   Risk Assessment/Risk Scores:   TIMI Risk Score for Unstable Angina or Non-ST Elevation MI:   [name] TIMI risk score is 3, which indicates a 13% risk of all cause mortality, new or recurrent myocardial infarction or need for urgent revascularization in the next 14 days.{   For questions or updates, please contact [place] Please consult [email or web address] for contact info under    Jarrett Soho, PA  10/10/2021 1:49 PM

## 2021-10-10 NOTE — ED Notes (Signed)
Patient on bedpan

## 2021-10-10 NOTE — ED Provider Notes (Signed)
[facility] DEPT Provider Note   CSN: [id number] Arrival date & time: 10/10/21  7425     History Chief Complaint  Patient presents with   Shortness of Breath    Jillian Cooley is a 87 y.o. female.  Patient presents to ER chief complaint of shortness of breath, difficulty breathing starting last night.  Typically uses 4 L nasal cannula but O2 saturation was low 80s on her baseline oxygen requirements per EMS.  She describes a cough for for 5 days but denies any fevers denies any chest pain or abdominal pain.  She has a history of multiple myeloma, history of 2 prior left-sided pleural effusions recent admission to the hospital last month.      Past Medical History:  Diagnosis Date   Abnormal EKG    Carpal tunnel syndrome    CKD (chronic kidney disease), stage III (HCC)    DDD (degenerative disc disease), lumbar    Hypertension    Hypertension with renal disease    Menopause    Mixed hyperlipidemia    Muscular degeneration    Nonrheumatic aortic (valve) insufficiency    Nonrheumatic mitral valve regurgitation    Osteoarthritis    Pedal edema    Pleural effusion    Skin cancer    Vitamin D deficiency     Patient Active Problem List   Diagnosis Date Noted   Multiple myeloma not having achieved remission ([place]) 09/30/2021   Normocytic anemia 09/18/2021   Monoclonal gammopathy 09/18/2021   Pleural effusion    Absolute anemia 08/12/2021   Stage 3a chronic kidney disease ([place]) 08/12/2021   Chronic anticoagulation 08/12/2021   Stage 3b chronic kidney disease ([place]) 06/26/2021   Bilateral pulmonary embolism ([place]) 06/18/2021   DVT, bilateral lower limbs ([place]) 06/18/2021   S/P right rotator cuff repair 04/23/2021   S/P arthroscopy of shoulder 04/23/2021   CHF (congestive heart failure) ([place])    Nonrheumatic mitral valve regurgitation 02/21/2021   Nonrheumatic tricuspid valve regurgitation 02/21/2021   Nonrheumatic aortic valve  insufficiency 02/21/2021   Preoperative cardiovascular examination 02/21/2021   Essential hypertension 02/21/2021   Herniated nucleus pulposus, lumbar 06/13/2012    Past Surgical History:  Procedure Laterality Date   ANTERIOR CERVICAL DECOMP/DISCECTOMY FUSION  2009   CARPAL TUNNEL RELEASE Bilateral    IR THORACENTESIS ASP PLEURAL SPACE W/IMG GUIDE  04/25/2021   LUMBAR LAMINECTOMY/DECOMPRESSION MICRODISCECTOMY  06/13/2012   Procedure: LUMBAR LAMINECTOMY/DECOMPRESSION MICRODISCECTOMY 1 LEVEL;  Surgeon: Ophelia Charter, MD;  Location: [facility];  Service: Neurosurgery;  Laterality: Right;  RIGHT Lumbar two-three diskectomy   SHOULDER ARTHROSCOPY WITH ROTATOR CUFF REPAIR AND SUBACROMIAL DECOMPRESSION Right 04/23/2021   Procedure: SHOULDER ARTHROSCOPY WITH ROTATOR CUFF REPAIR AND SUBACROMIAL DECOMPRESSION;  Surgeon: Earlie Server, MD;  Location: [facility];  Service: Orthopedics;  Laterality: Right;   THORACENTESIS N/A 09/10/2021   Procedure: Mathews Robinsons;  Surgeon: Lanier Clam, MD;  Location: [facility] ENDOSCOPY;  Service: Pulmonary;  Laterality: N/A;     OB History   No obstetric history on file.     Family History  Problem Relation Age of Onset   Colon cancer Maternal Grandfather    Breast cancer Neg Hx     Social History   Tobacco Use   Smoking status: Never   Smokeless tobacco: Never  Substance Use Topics   Alcohol use: No   Drug use: No    Home Medications Prior to Admission medications   Medication Sig Start Date End Date Taking?  Authorizing Provider  acyclovir (ZOVIRAX) 400 MG tablet Take 1 tablet (400 mg total) by mouth 2 (two) times daily. 09/30/21   Orson Slick, MD  amLODipine (NORVASC) 5 MG tablet Take 5 mg by mouth in the morning. 11/08/19   [provider]  Calcium Carb-Cholecalciferol (CALTRATE 600+D3 PO) Take 1 tablet by mouth daily with breakfast.    [provider]  dexamethasone (DECADRON) 4 MG tablet Take 5  tablets (20 mg total) by mouth once a week. 09/30/21   Orson Slick, MD  ELIQUIS 5 MG TABS tablet TAKE 1 TABLET BY MOUTH TWICE A DAY 10/09/21   Dede Query T, PA-C  ferrous sulfate 325 (65 FE) MG EC tablet Take 1 tablet (325 mg total) by mouth daily with breakfast. 07/02/21   Lincoln Brigham, PA-C  furosemide (LASIX) 40 MG tablet Take 1 tablet (40 mg total) by mouth 2 (two) times daily. Patient taking differently: Take 20-40 mg by mouth See admin instructions. Take 20 mg twice daily on Tues and Thurs, Take 40 mg twice daily on Sun, Mon, Wed, Fri, and Sat 07/08/21   Werner Lean, MD  HYDROcodone-acetaminophen (NORCO/VICODIN) 5-325 MG tablet Take 1 tablet by mouth every 4 (four) hours as needed for moderate pain (may need 1-2 first few days followin surgery max 8 tabs in 24hrs). 04/23/21 04/23/22  Chadwell, Vonna Kotyk, PA-C  losartan (COZAAR) 25 MG tablet Take 25 mg by mouth in the morning. 12/16/19   [provider]  Multiple Vitamins-Minerals (PRESERVISION AREDS 2+MULTI VIT PO) Take 1 tablet by mouth 2 (two) times daily.    [provider]  ondansetron (ZOFRAN) 8 MG tablet Take 1 tablet (8 mg total) by mouth every 8 (eight) hours as needed. 09/30/21   Orson Slick, MD  prochlorperazine (COMPAZINE) 10 MG tablet Take 1 tablet (10 mg total) by mouth every 6 (six) hours as needed for nausea or vomiting. 09/30/21   Orson Slick, MD  simvastatin (ZOCOR) 20 MG tablet Take 20 mg by mouth every evening. 11/03/20   [provider]  vitamin B-12 (CYANOCOBALAMIN) 1000 MCG tablet Take 1 tablet (1,000 mcg total) by mouth daily. 07/02/21   Lincoln Brigham, PA-C    Allergies    Sulfa antibiotics  Review of Systems   Review of Systems  Constitutional:  Negative for fever.  HENT:  Negative for ear pain.   Eyes:  Negative for pain.  Respiratory:  Positive for shortness of breath. Negative for cough.   Cardiovascular:  Negative for chest pain.  Gastrointestinal:   Negative for abdominal pain.  Genitourinary:  Negative for flank pain.  Musculoskeletal:  Negative for back pain.  Skin:  Negative for rash.  Neurological:  Negative for headaches.   Physical Exam Updated Vital Signs BP (!) 87/66   Pulse 95   Temp (!) 97 F (36.1 C) (Rectal)   Resp 19   SpO2 98%   Physical Exam Constitutional:      Appearance: Normal appearance.     Comments: Tired appearing  HENT:     Head: Normocephalic.     Nose: Nose normal.  Eyes:     Extraocular Movements: Extraocular movements intact.  Cardiovascular:     Rate and Rhythm: Normal rate.  Pulmonary:     Effort: Respiratory distress present.     Breath sounds: Rales present.     Comments: Decreased breath sounds on left. Abdominal:     Tenderness: There is no abdominal  tenderness. There is no guarding or rebound.  Musculoskeletal:        General: Normal range of motion.     Cervical back: Normal range of motion.  Neurological:     General: No focal deficit present.     Mental Status: She is alert. Mental status is at baseline.    ED Results / Procedures / Treatments   Labs (all labs ordered are listed, but only abnormal results are displayed) Labs Reviewed  CBC WITH DIFFERENTIAL/PLATELET - Abnormal; Notable for the following components:      Result Value   Hemoglobin 11.5 (*)    Neutro Abs 8.4 (*)    All other components within normal limits  COMPREHENSIVE METABOLIC PANEL - Abnormal; Notable for the following components:   CO2 19 (*)    Glucose, Bld 322 (*)    BUN 74 (*)    Creatinine, Ser 2.43 (*)    GFR, Estimated 19 (*)    All other components within normal limits  BRAIN NATRIURETIC PEPTIDE - Abnormal; Notable for the following components:   B Natriuretic Peptide 1,070.9 (*)    All other components within normal limits  LACTIC ACID, PLASMA - Abnormal; Notable for the following components:   Lactic Acid, Venous 4.3 (*)    All other components within normal limits  BLOOD GAS, ARTERIAL  - Abnormal; Notable for the following components:   pH, Arterial 7.103 (*)    pCO2 arterial 51.8 (*)    Bicarbonate 15.5 (*)    Acid-base deficit 13.9 (*)    All other components within normal limits  PROTIME-INR - Abnormal; Notable for the following components:   Prothrombin Time 19.7 (*)    INR 1.7 (*)    All other components within normal limits  TROPONIN I (HIGH SENSITIVITY) - Abnormal; Notable for the following components:   Troponin I (High Sensitivity) 523 (*)    All other components within normal limits  TROPONIN I (HIGH SENSITIVITY) - Abnormal; Notable for the following components:   Troponin I (High Sensitivity) 836 (*)    All other components within normal limits  RESP PANEL BY RT-PCR (FLU A&B, COVID) ARPGX2  CULTURE, BLOOD (ROUTINE X 2)  CULTURE, BLOOD (ROUTINE X 2)  APTT  HEPARIN LEVEL (UNFRACTIONATED)    EKG EKG Interpretation  Date/Time:  Friday October 10 2021 06:54:40 EDT Ventricular Rate:  86 PR Interval:  132 QRS Duration: 114 QT Interval:  355 QTC Calculation: 425 R Axis:   -32 Text Interpretation: Sinus rhythm Atrial premature complex Incomplete left bundle branch block LVH with secondary repolarization abnormality Inferior infarct, old Anterior Q waves, possibly due to LVH Confirmed by Orpah Greek [phone]) on 10/10/2021 7:11:19 AM  Radiology DG Chest Portable 1 View  Result Date: 10/10/2021 CLINICAL DATA:  Increased short of breath EXAM: PORTABLE CHEST 1 VIEW COMPARISON:  None. FINDINGS: Cardiac silhouette is obscured by a large LEFT pleural effusion. Effusion is increased significantly from comparison exam and now occupies 2/3 of the LEFT hemithorax. Small RIGHT effusion is also increased. Mild airspace disease in RIGHT lower lobe. IMPRESSION: 1. Markedly increased large LEFT pleural effusion. 2. Small RIGHT effusion and probable RIGHT lower lobe edema. Electronically Signed   By: Suzy Bouchard M.D.   On: 10/10/2021 07:36     Procedures .Critical Care Performed by: Luna Fuse, MD Authorized by: Luna Fuse, MD   Critical care provider statement:    Critical care time (minutes):  40   Critical care time was  exclusive of:  Separately billable procedures and treating other patients and teaching time   Critical care was necessary to treat or prevent imminent or life-threatening deterioration of the following conditions:  Cardiac failure, respiratory failure and metabolic crisis   Medications Ordered in ED Medications  heparin ADULT infusion 100 units/mL (25000 units/267mL) (650 Units/hr Intravenous New Bag/Given 10/10/21 1152)  piperacillin-tazobactam (ZOSYN) IVPB 3.375 g (3.375 g Intravenous New Bag/Given 10/10/21 1100)  aspirin chewable tablet 324 mg (324 mg Oral Given 10/10/21 1142)  sodium chloride 0.9 % bolus 500 mL (500 mLs Intravenous New Bag/Given 10/10/21 1103)  heparin bolus via infusion 3,300 Units (3,300 Units Intravenous Bolus from Bag 10/10/21 1153)    ED Course  I have reviewed the triage vital signs and the nursing notes.  Pertinent labs & imaging results that were available during my care of the patient were reviewed by me and considered in my medical decision making (see chart for details).    MDM Rules/Calculators/A&P                            Patient signed out to me is pending ancillary tests.  Patient appears very fatigued.  But is otherwise awake and alert answering questions.  Labs show normal white count, however lactic acid elevated 4.3, troponin of 500.  EKG reviewed showing regular rhythm regular rate no ST elevations or depressions noted no T wave inversions noted.  Patient continues to deny any chest pain or chest pressure.  Repeat troponin continues to be elevated 800.  At this point she was given aspirin and heparin was ordered.  Cardiology consulted for evaluation.  Patient has been on BiPAP and clinically appearing improved appearing more  comfortable.  Blood pressure initially in the 263 systolic dropped to 85 systolic, given 5 or cc bolus of fluids.  Sepsis was considered, despite lactic acidosis, she has no fevers normal white count.  And.  Blood culture sent and antibiotic started.  She was not given sepsis IV fluid resuscitation given her hypoxemic presentation and concern for fluid overload with an elevated proBNP.  Consultation with intensivist for admission.  Final Clinical Impression(s) / ED Diagnoses Final diagnoses:  Acute respiratory failure, unspecified whether with hypoxia or hypercapnia (HCC)  Pleural effusion on left    Rx / DC Orders ED Discharge Orders     None        Luna Fuse, MD 10/10/21 1156

## 2021-10-10 NOTE — Progress Notes (Signed)
eLink Physician-Brief Progress Note Patient Name: Jillian Cooley DOB: [birth year] MRN: [id number]   Date of Service  10/10/2021  HPI/Events of Note  Multiple issues: 1. Hypotension - BP = 79/51 with MAP = 56. 2. Troponin = 523 --> 836 --> 3307 --> 5378. Already on ASA and a Heparin IV infusion. Can't B-Block d/t hypotension requiring a Phenylephrine IV infusion. 3. Patient c/o arm pain related to phlebotomy site.   eICU Interventions  Plan: Phenylephrine IV infusion via PIV. Titrate to MAP >= 65. 12 Lead EKG STAT. Continue ASA and Heparin IV infusion.     Intervention Category Major Interventions: Other:  Lysle Dingwall 10/10/2021, 8:27 PM

## 2021-10-10 NOTE — Progress Notes (Signed)
eLink Physician-Brief Progress Note Patient Name: Jillian Cooley DOB: 21-Jan-1932 MRN: [id number]   Date of Service  10/10/2021  HPI/Events of Note  12 Lead EKG reveals sinus rhythm with Premature atrial complexes. Inferior infarct , age undetermined. Anteroseptal infarct , age undetermined. Clinical picture c/w NSTEMI.  eICU Interventions  Continue present management.      Intervention Category Major Interventions: Other:  Lysle Dingwall 10/10/2021, 9:14 PM

## 2021-10-10 NOTE — Progress Notes (Signed)
BIPAP is not needed at this time. Pt is resting well, No resp distress noted.

## 2021-10-10 NOTE — ED Triage Notes (Signed)
Pt to ED by EMS from home with c/o SOB which began this morning. Pt has a Hx of "blood cancer". MD and RT at bedside at this time. EMS states pt had a SPO2 of 85% on 4LNC when they arrived. Pt arrives to ED on 15L NRB.

## 2021-10-10 NOTE — Progress Notes (Addendum)
ANTICOAGULATION CONSULT NOTE - Initial Consult  Pharmacy Consult for Heparin Indication: chest pain/ACS  Allergies  Allergen Reactions   Sulfa Antibiotics Nausea Only    Patient Measurements:   Heparin Dosing Weight: TBW  Vital Signs: Temp: 97 F (36.1 C) (10/21 0957) Temp Source: Rectal (10/21 0957) BP: 91/70 (10/21 1030) Pulse Rate: 96 (10/21 1030)  Labs: Recent Labs    10/10/21 0731 10/10/21 0935  HGB 11.5*  --   HCT 37.4  --   PLT 311  --   LABPROT 19.7*  --   INR 1.7*  --   CREATININE 2.43*  --   TROPONINIHS 523* 836*    Estimated Creatinine Clearance: 13.5 mL/min (A) (by C-G formula based on SCr of 2.43 mg/dL (H)).   Medical History: Past Medical History:  Diagnosis Date   Abnormal EKG    Carpal tunnel syndrome    CKD (chronic kidney disease), stage III (HCC)    DDD (degenerative disc disease), lumbar    Hypertension    Hypertension with renal disease    Menopause    Mixed hyperlipidemia    Muscular degeneration    Nonrheumatic aortic (valve) insufficiency    Nonrheumatic mitral valve regurgitation    Osteoarthritis    Pedal edema    Pleural effusion    Skin cancer    Vitamin D deficiency     Medications:  Scheduled:   aspirin  324 mg Oral Once   Infusions:   piperacillin-tazobactam 3.375 g (10/10/21 1100)    Assessment: 29 yoF presented to ED with ShOB.  Heparin per pharmacy for ACS.  Baseline coags pending.  PTA Eliquis held, last dose 10/20 at 18:00 Troponin: 523, 836 CBC:  Hgb 11.5, Plt WNL  Goal of Therapy:  Heparin level 0.3-0.7 units/ml Monitor platelets by anticoagulation protocol: Yes   Plan:  Give heparin 3300 units bolus IV x 1 Start heparin IV infusion at 650 units/hr Heparin level 8 hours after starting Daily heparin level and CBC Continue to monitor H&H and platelets   Gretta Arab PharmD, BCPS Clinical Pharmacist [facility] main pharmacy [phone] 10/10/2021 11:10 AM

## 2021-10-10 NOTE — Progress Notes (Signed)
ANTICOAGULATION CONSULT NOTE - Initial Consult  Pharmacy Consult for Heparin Indication: chest pain/ACS  Allergies  Allergen Reactions   Sulfa Antibiotics Nausea Only    Patient Measurements: Height: 5\' 3"  (160 cm) Weight: 53.9 kg (118 lb 13.3 oz) IBW/kg (Calculated) : 52.4 Heparin Dosing Weight: TBW  Vital Signs: Temp: 98.1 F (36.7 C) (10/21 1530) Temp Source: Oral (10/21 1530) BP: 116/70 (10/21 1900) Pulse Rate: 97 (10/21 1946)  Labs: Recent Labs    10/10/21 0731 10/10/21 0935 10/10/21 1130 10/10/21 1548 10/10/21 1822 10/10/21 1920  HGB 11.5*  --   --  10.6*  --   --   HCT 37.4  --   --  34.2*  --   --   PLT 311  --   --  218  --   --   APTT  --   --  29  --   --  58*  LABPROT 19.7*  --   --   --   --   --   INR 1.7*  --   --   --   --   --   HEPARINUNFRC  --   --   --   --   --  >1.10*  CREATININE 2.43*  --   --  1.93*  --   --   TROPONINIHS 523* 836*  --  3,307* 5,378*  --      Estimated Creatinine Clearance: 17 mL/min (A) (by C-G formula based on SCr of 1.93 mg/dL (H)).   Medical History: Past Medical History:  Diagnosis Date   Abnormal EKG    Carpal tunnel syndrome    CKD (chronic kidney disease), stage III (HCC)    DDD (degenerative disc disease), lumbar    Hypertension    Hypertension with renal disease    Menopause    Mixed hyperlipidemia    Muscular degeneration    Nonrheumatic aortic (valve) insufficiency    Nonrheumatic mitral valve regurgitation    Osteoarthritis    Pedal edema    Pleural effusion    Skin cancer    Vitamin D deficiency     Medications:  Scheduled:   Chlorhexidine Gluconate Cloth  6 each Topical Daily   famotidine  20 mg Oral Daily   hydrocortisone sod succinate (SOLU-CORTEF) inj  50 mg Intravenous Q6H   mouth rinse  15 mL Mouth Rinse BID   Infusions:   sodium chloride Stopped (10/10/21 1902)   azithromycin 500 mg (10/10/21 1902)   cefTRIAXone (ROCEPHIN)  IV     heparin 650 Units/hr (10/10/21 1902)   sodium  bicarbonate 150 mEq in D5W infusion      Assessment: 68 yoF presented to ED with ShOB.  Heparin per pharmacy for ACS.  PTA Eliquis held, last dose 10/20 at 18:00 Troponin: 523, 836 >> 5378 CBC:  Hgb 11.5, Plt WNL Baseline aPTT 29 seconds, INR 1.7  10/10/21 PM: -aPTT subtherapeutic at 58 seconds, HL elevated >1.1 as expected with recent apixaban administration -No issues with line or bleeding per RN  Goal of Therapy:  Heparin level 0.3-0.7 units/ml aPTT 66-102 seconds Monitor platelets by anticoagulation protocol: Yes   Plan:  Increase heparin drip to 750 units/hr F/u aPTT 8 hours after rate change Daily heparin level and CBC Continue to monitor H&H and platelets  Dimple Nanas, PharmD 10/10/2021 7:56 PM

## 2021-10-10 NOTE — Telephone Encounter (Signed)
Called patient regarding upcoming appointments, spoke with patient's daughter. Patient will be notified of upcoming appointments.

## 2021-10-11 ENCOUNTER — Inpatient Hospital Stay (HOSPITAL_COMMUNITY): Payer: Medicare PPO

## 2021-10-11 DIAGNOSIS — I959 Hypotension, unspecified: Secondary | ICD-10-CM

## 2021-10-11 DIAGNOSIS — R778 Other specified abnormalities of plasma proteins: Secondary | ICD-10-CM | POA: Diagnosis not present

## 2021-10-11 LAB — APTT
aPTT: 78 seconds — ABNORMAL HIGH (ref 24–36)
aPTT: 70 seconds — ABNORMAL HIGH (ref 24–36)

## 2021-10-11 LAB — RESPIRATORY PANEL BY PCR

## 2021-10-11 LAB — BLOOD GAS, ARTERIAL
Acid-base deficit: 3.2 mmol/L — ABNORMAL HIGH (ref 0.0–2.0)
Bicarbonate: 20.2 mmol/L (ref 20.0–28.0)
Drawn by: 560031
O2 Saturation: 95.4 %
Patient temperature: 98.6
pCO2 arterial: 32.6 mmHg (ref 32.0–48.0)
pH, Arterial: 7.41 (ref 7.350–7.450)
pO2, Arterial: 75.9 mmHg — ABNORMAL LOW (ref 83.0–108.0)

## 2021-10-11 LAB — CBC
HCT: 32.7 % — ABNORMAL LOW (ref 36.0–46.0)
Hemoglobin: 10.4 g/dL — ABNORMAL LOW (ref 12.0–15.0)
MCH: 29.5 pg (ref 26.0–34.0)
MCHC: 31.8 g/dL (ref 30.0–36.0)
MCV: 92.6 fL (ref 80.0–100.0)
Platelets: 213 10*3/uL (ref 150–400)
RBC: 3.53 MIL/uL — ABNORMAL LOW (ref 3.87–5.11)
RDW: 15 % (ref 11.5–15.5)
WBC: 9.9 10*3/uL (ref 4.0–10.5)
nRBC: 0 % (ref 0.0–0.2)

## 2021-10-11 LAB — BASIC METABOLIC PANEL
Anion gap: 10 (ref 5–15)
BUN: 79 mg/dL — ABNORMAL HIGH (ref 8–23)
CO2: 24 mmol/L (ref 22–32)
Calcium: 8.9 mg/dL (ref 8.9–10.3)
Chloride: 104 mmol/L (ref 98–111)
Creatinine, Ser: 2.53 mg/dL — ABNORMAL HIGH (ref 0.44–1.00)
GFR, Estimated: 18 mL/min — ABNORMAL LOW (ref >60)
Glucose, Bld: 152 mg/dL — ABNORMAL HIGH (ref 70–99)
Potassium: 4.1 mmol/L (ref 3.5–5.1)
Sodium: 138 mmol/L (ref 135–145)

## 2021-10-11 LAB — PHOSPHORUS: Phosphorus: 6.1 mg/dL — ABNORMAL HIGH (ref 2.5–4.6)

## 2021-10-11 LAB — HEPARIN LEVEL (UNFRACTIONATED): Heparin Unfractionated: >1.1 IU/mL — ABNORMAL HIGH (ref 0.30–0.70)

## 2021-10-11 LAB — AMYLASE, PLEURAL OR PERITONEAL FLUID: Amylase, Fluid: 20 U/L

## 2021-10-11 LAB — MAGNESIUM: Magnesium: 2.2 mg/dL (ref 1.7–2.4)

## 2021-10-11 MED ORDER — FUROSEMIDE 10 MG/ML IJ SOLN
20.0000 mg | Freq: Once | INTRAMUSCULAR | Status: AC
  Administered 2021-10-11: 20 mg via INTRAVENOUS
  Filled 2021-10-11: qty 2

## 2021-10-11 MED ORDER — FUROSEMIDE 10 MG/ML IJ SOLN
40.0000 mg | Freq: Two times a day (BID) | INTRAMUSCULAR | Status: DC
  Administered 2021-10-11 – 2021-10-12 (×2): 40 mg via INTRAVENOUS
  Filled 2021-10-11 (×2): qty 4

## 2021-10-11 MED ORDER — HYDROCORTISONE SOD SUC (PF) 100 MG IJ SOLR
50.0000 mg | Freq: Two times a day (BID) | INTRAMUSCULAR | Status: DC
  Administered 2021-10-11 – 2021-10-14 (×6): 50 mg via INTRAVENOUS
  Filled 2021-10-11 (×6): qty 2

## 2021-10-11 MED ORDER — ASPIRIN EC 81 MG PO TBEC
81.0000 mg | DELAYED_RELEASE_TABLET | Freq: Every day | ORAL | Status: DC
  Administered 2021-10-11 – 2021-10-23 (×13): 81 mg via ORAL
  Filled 2021-10-11 (×13): qty 1

## 2021-10-11 NOTE — Progress Notes (Signed)
Pt stated feeling better after sitting up. Pt O2 saturation 97. Pt still wheezy.

## 2021-10-11 NOTE — Progress Notes (Addendum)
NAME:  Jillian Cooley, MRN:  [id number], DOB:  [birth year], LOS: 1 ADMISSION DATE:  10/10/2021, CONSULTATION DATE:  10/10/2021  REFERRING MD:  Dr Dorann Lodge ER, CHIEF COMPLAINT:  resp failure, large left effusion and metabolic acidosis   BRIEF  Background hx Hematological/Oncological History #Bilateral DVT and PE: 1) 04/23/2021-04/26/2021: Admitted for right rotator cuff repair and found to have acute hypoxia.  Chest xray found small left pleural effusion with associated atelectasis and trace right pleural effusion and right basilar airspace disease. Underwent left thoracentesis with removal of 800 cc. Due to persistent hypoxia, patient underwent NM pulmonary perfusion study that confirmed bilateral pulmonary emboli. Doppler US revealed bilateral acute DVT involving the right soleal veins and left posterior tibial veins. Patient was started on Eliquis.  2)  06/17/2021: Establish care with Dede Query PA-C and recommend Eliquis x 6 months.    #Normocytic Anemia: 1) 06/17/2021: Labs show anemia with Hgb 10.4, MCV 90.1 2) 06/27/2021: Labs show iron deficiency with serum iron 33, iron saturation 13%; vitamin B12 deficiency with vitamin B12 at 266 and elevated MMA at 410. Folate level normal. Recommended oral ferrous sulfate 325 mg once daily and vitamin b12 1000 mcg once daily.    #Multiple Myeloma:significant for Kappa light chain multiple myeloma  1) 09/04/2021: Labs from [facility] -TIBC 228 (L), Iron 64, Iron saturation 28%, Ferritin 216 (H), Creatinine 1.96 (H), BUN 51 (H), Calcium 10.0, -CBC: Hgb 11.2,  -SPEP revealed M-Spike 0.3 with immunofixation that showed monoclonal free kappa light chain.  -sFLC: Free Kappa Lt Chain: 4504.5 (H), Free Lambda Lt Chain: 19.7, Kappa/Lambda Ratio: 228.65 (H) 09/24/2021: bone marrow biopsy showed hypercellular bone marrow for age with plasma cell neoplasm with plasma cells representing 85% of for cells in the aspirate    September 30,  202 seen by by pulmonary critical care service: -?  Outpatient admitted for worsening dyspnea and found to have significant left pleural effusion [given back in January 2022 she had left greater than right pleural effusion].  Echocardiogram from May 2022 showed grade 2 diastolic dysfunction with significant elevation in pulmonary artery pressures.  Mild pericardial effusion +.  And mild to moderate arctic regurgitation.  Thoracentesis showed transudate.  Cytology nondiagnostic for malignant cells [prior thoracentesis in February 2022 with similar results]   09/30/21 oncology clinic  continues to have issues with shortness of breath and low energy.  She was prescribed oxygen which she does occasionally use at home.  She notes that she has not been having any issues with fevers, chills, sweats, nausea, vomiting or diarrhea.  Her health is otherwise been stable  10/10/2021 - ADMIT HPI ER -scheduled for chemotherapy today but present with worsening hypoxemia.  Husband found her pulse ox in the 80s despite her 4 L nasal cannula.  In the ER started on BiPAP.  Blood pressures been intermittently soft.  Performed metabolic acidosis with a pH of 7.1 and a lactic acid of 4.  Blood pressure soft but responded to fluid bolus.  Echocardiogram being done in the ER with provisional technician reporting "ejection fraction down].  Chest x-ray with large left-sided pleural effusion.  Also having acute on chronic kidney injury [baseline creatinine 1.84 mg percent] current creatinine 2.14 mg percent.  She has been started on BiPAP.  Also troponin increased from 500-800 with BNP of 1000.  IV heparin has been started by the ER.  Baseline albumin appears to be 3.5-3.4 suggesting mild protein calorie malnutrition.  Also some amount of failure to  thrive   She has a living will that asked for no resuscitation of persistent vegetative state incurable and terminal illness but in talking to her CODE STATUS currently is full code  although she is reflecting on her decisions about intubation and CPR.  Pertinent  Medical History    has a past medical history of Abnormal EKG, Carpal tunnel syndrome, CKD (chronic kidney disease), stage III ([place]), DDD (degenerative disc disease), lumbar, Hypertension, Hypertension with renal disease, Menopause, Mixed hyperlipidemia, Muscular degeneration, Nonrheumatic aortic (valve) insufficiency, Nonrheumatic mitral valve regurgitation, Osteoarthritis, Pedal edema, Pleural effusion, Skin cancer, and Vitamin D deficiency.   has a past surgical history that includes Anterior cervical decomp/discectomy fusion (2009); Lumbar laminectomy/decompression microdiscectomy (06/13/2012); Carpal tunnel release (Bilateral); Shoulder arthroscopy with rotator cuff repair and subacromial decompression (Right, 04/23/2021); IR THORACENTESIS ASP PLEURAL SPACE W/IMG GUIDE (04/25/2021); and Thoracentesis (N/A, 09/10/2021).    Significant Hospital Events: Including procedures, antibiotic start and stop dates in addition to other pertinent events   10/10/2021 - admit. - seen in [place] ER 12 - On BiPAP. Soft BP, lactic acidosis 4 with pH 7.1, s/p 1.5 L thoracentesis transudate with LDH 64.  [Cytology pending].  Stress dose hydrocortisone and fluids for soft blood pressure.  IV heparin for non-STEMI.   Interim History / Subjective:  10/10/2021  -seen by cardiology.  Demand ischemia suspected troponin peaked at 5000.  Currently on 4 L oxygen and off BiPAP.  Feeling better after thoracentesis.  Chest x-ray with reduced but residual left pleural effusion.  Currently on bicarbonate infusion and heparin infusion.  Not on pressors. AFebirle an cultur enegative  BP improved 100-119 sbp.,. and dias 50s  Objective   Blood pressure 130/76, pulse 93, temperature 98.4 F (36.9 C), temperature source Oral, resp. rate 16, height $RemoveBe'5\' 3"'ZUxIifryY$  (1.6 m), weight 56.1 kg, SpO2 96 %.      Estimated body mass index is 21.91 kg/m as calculated from  the following:   Height as of this encounter: $RemoveBeforeD'5\' 3"'HXgWmZnnyLozFt$  (1.6 m).   Weight as of this encounter: 56.1 kg.   Intake/Output Summary (Last 24 hours) at 10/11/2021 0931 Last data filed at 10/11/2021 0900 Gross per 24 hour  Intake 1736.76 ml  Output 260 ml  Net 1476.76 ml   Filed Weights   10/10/21 1810 10/11/21 0343  Weight: 53.9 kg 56.1 kg    Examination: General Appearance:  Looks much better. Frail. Cachecitic Head:  Normocephalic, without obvious abnormality, atraumatic Eyes:  PERRL - yes, conjunctiva/corneas - clear     Ears:  Normal external ear canals, both ears Nose:  G tube - no but has Villalba 4L - 100% Throat:  ETT TUBE - no , OG tube - no Neck:  Supple,  No enlargement/tenderness/nodules Lungs: Clear to auscultation bilaterally, LEft side deminished Heart:  S1 and S2 normal, no murmur, CVP - no.  Pressors - no Abdomen:  Soft, no masses, no organomegaly Genitalia / Rectal:  Not done Extremities:  Extremities- intact Skin:  ntact in exposed areas . Sacral area - not examined Neurologic:  Sedation - none -> RASS - +1 . Moves all 4s - yes. CAM-ICU - neg . Orientation - x3+     Resolved Hospital Problem list   x  Assessment & Plan:  ASSESSMENT / PLAN:  PULMONARY  A:  History of DVT and PE in May 2022 -prior to admission: Currently on Eliquis at admit Chronic recurrent left pleural effusion transudative  -February 2022, May 2022 in September 2022 Chronic hypoxemic resp  failure -2 L [place] at home - correlating with pleural effusion left  Acute on chronic hypoxemic respiratory failure severe requiring BiPAP: Present on admission  Large recurrent pleural effusion: Present on admission 10/10/2021 - s/p 1.5L thora with dyspnea improvement (transudate again). Dupl;ex LE 10/10/21  - neg dvt  10/11/2021 ->4L  - pulse ox 100%. Off BiPAP  P:   Dc bipap O2 for poulse ox > 92% (at home was using prn) - seems hypoxemia correlates with  IV heparin being given for cardiac reasons  [see below]    NEUROLOGIC A:   ?  On chronic chronic opioids  Normal mental status at admission but at risk for encephalopathy  10/11/2021 - normal mental status  P:   Monitor    VASCULAR A:   Hypotension at admission -responded to fluid bolus ?  Cardiac etiology versus septic versus relative adrenal insufficiency [med list shows Decadron 20 mg once a week] at admission  10/11/2021 - improved with hydrocort. No evidence of sepsis  P:  SBP > 95 and MAP > 65 is goal Hydrocortisone stress dose    CARDIAC STRUCTURAL A: Hypertension - Prior to & Present on Admit.  On amlodipine at home and Lasix  Grade 2 diastolic dysfunction with severe elevation pulmonary artery pressures -May 2022- Prior to & Present on Admit  Currently: Acute on chronic diastolic dysfunction and severe mitral regurgitation with severe elevation pulmonary artery pressures and left atrial dilatation -present on admission [similar to prior echo in May 2022]  Concern for non-STEMI present on admission  - started IV hepain in ER  10/11/2021 - echo with evidence for esvere MR, increased PASP  . EKG no change in baseline and pk trop 5000  P: IV heparin to continue Cardiology consult appreciated - TEE being considered PCCM suggests Right Heart cath (duplex LE also showing pulsatile flow c/w PASP increase) LAsix x $Remov'20mg'CyGMif$  x 1 test   CARDIAC ELECTRICAL A: Sinus tach   P: Telemetry monitoring  INFECTIOUS A:   Preadmission acyclovir ?  Indication At risk for sepsis but no clear-cut evidence of sepsis  10/11/2021 - PCT < 0.1, afebrile, Urine strep ng  P:   Check respiratory virus panel Await  urine Legionella DC abx Hold acyclovir till renal failure improves -we will discuss with pharmacy about restart Ceftriaxone 10/21 - 10/22 Azithro 10/21- 1022 Zosyn 10/21- 10/21     RENAL A:  Chronic kidney disease secondary to myeloma-Baseline creatinine of 2.3 mg percent September 2022 Acute on chronic  kidney injury with significant prerenal component -admit creatinine of 2.4 mg percent  10/11/2021: Creatinine up to 2.5 mg percent  P:  Avoid nephrotoxins Monitor with maintaining hemodynamics  ELECTROLYTES A:  Hyperphostatemia at admit  10/22 - phos still up. Rest ok  P: Monitor and replete   Acid-base  A: Severe lactic acidosis and metabolic acidosis at admission.?  Due to cardiac versus renal injury for myeloma versus sepsis  10/10/21 - seems resolved but still on bicarb. Etioogy still not clear. ? Perfusion issues from circulatory failure related to worsening left effusion  Plan  - Stop  bicarbonate infusion -> ABG -> fine 1h later -> move to tele. No need for bic gtt  - check lactate 10/12/21  GASTROINTESTINAL A:   Normal liver function test at admission  10/11/2021 D - wants to ear  P:   Heart healthy diedt PPI  HEMATOLOGIC   - HEME A:  Mild anemia present on admission hemoglobin 11.5  10/11/2021 = -  hgb 10.4 and c/w ciritical illness. No bleed  P:  - PRBC for hgb </= 6.9gm%    - exceptions are   -  if ACS susepcted/confirmed then transfuse for hgb </= 8.0gm%,  or    -  active bleeding with hemodynamic instability, then transfuse regardless of hemoglobin value   At at all times try to transfuse 1 unit prbc as possible with exception of active hemorrhage   HEMATOLOGIC - Platelets A Normal platelets  P IV heparin [monitor for thrombocytopenia]  Oncology  Multiple myeloma - Prior to & Present on Admit.  Untreated.  Was due to start chemotherapy 10/10/2021.  Sees Dr. Leonides Schanz   Plan  - No chemo given current critically ill state - Suspect cardiac status and overall functional status needs to improve before chemo  ENDOCRINE A:   At risk for hypo and hyperglycemia P:   SSI  MSK/DERM Failure to thrive - Prior to & Present on Admit (admit to weight loss prior to admit and low appetite x 1 mo) Mild protein calorie malnutrition - Prior to &  Present on Admit  Plan  - Nutrition consult   Best Practice (right click and "Reselect all SmartList Selections" daily)   Diet/type: NPO w/ oral meds DVT prophylaxis: systemic heparin GI prophylaxis: H2B Lines: N/A Foley:  N/A Code Status:  full code  Last date of multidisciplinary goals of care discussion [detail multidisciplinary goals of care held in the presence of ER RN Joni Reining and daughter and patient. On 10/10/2021  As she has a living will where she does not want to be resuscitated but only kept comfortable in the case of incurable or terminal illness or persistent vegetative state.  I saw this document.  However the current condition is that she is critically ill.  She is opted for full medical care and initially said not intubate but after further reflection she wants to be intubated for the short-term and also have CPR.  However she wants to reflect on all this.  Her decline is currently a little bit of an emotional shock to her]    Dispo: move to tele depending on abg result. CCM will follow 2x/week. Needs RHC in our opinion +/- TEE. This admit in our opinion. D/w Dr Len Blalock    Dr. Kalman Shan, M.D., F.C.C.P,  Pulmonary and Critical Care Medicine Staff Physician, [facility] Center Director - Interstitial Lung Disease  Program  Pulmonary Fibrosis [facility] Network at [place], [place], [address]  NPI Number:  NPI #[id number]  Pager: [phone], If no answer  -> Check AMION or Try [phone] Telephone (clinical office): [phone] Telephone (research): [phone]  10:32 AM 10/11/2021    LABS    PULMONARY Recent Labs  Lab 10/10/21 0650 10/10/21 1453  PHART 7.103* 7.372  PCO2ART 51.8* 32.7  PO2ART 89.0 89.5  HCO3 15.5* 18.5*  O2SAT 91.5 96.7    CBC Recent Labs  Lab 10/10/21 0731 10/10/21 1548 10/11/21 0333  HGB 11.5* 10.6* 10.4*  HCT 37.4 34.2* 32.7*  WBC 10.1 10.9* 9.9  PLT 311 218 213     COAGULATION Recent Labs  Lab 10/10/21 0731  INR 1.7*    CARDIAC  No results for input(s): TROPONINI in the last 168 hours. No results for input(s): PROBNP in the last 168 hours.   CHEMISTRY Recent Labs  Lab 10/10/21 0731 10/10/21 1548 10/11/21 0333  NA 136 138 138  K 4.6 4.2 4.1  CL 103 107 104  CO2 19* 18* 24  GLUCOSE 322* 105* 152*  BUN 74* 73* 79*  CREATININE 2.43* 1.93* 2.53*  CALCIUM 9.6 9.0 8.9  MG  --  2.3 2.2  PHOS  --  5.8* 6.1*   Estimated Creatinine Clearance: 13 mL/min (A) (by C-G formula based on SCr of 2.53 mg/dL (H)).   LIVER Recent Labs  Lab 10/10/21 0731 10/10/21 1548  AST 33 32  ALT 34 30  ALKPHOS 56 45  BILITOT 0.7 0.8  PROT 6.9 6.0*  ALBUMIN 3.8 3.4*  INR 1.7*  --      INFECTIOUS Recent Labs  Lab 10/10/21 0731 10/10/21 1822  LATICACIDVEN 4.3*  --   PROCALCITON  --  <0.10     ENDOCRINE CBG (last 3)  No results for input(s): GLUCAP in the last 72 hours.       IMAGING x48h  - image(s) personally visualized  -   highlighted in bold DG Chest 1 View  Result Date: 10/10/2021 CLINICAL DATA:  Post thoracentesis EXAM: CHEST  1 VIEW COMPARISON:  Earlier same day FINDINGS: Decreased left pleural effusion with improved left lung aeration. Right pleural effusion is also decreased with improved right basilar aeration. Stable interstitial prominence. No pneumothorax. Normal heart size for technique. IMPRESSION: Significantly decreased left pleural effusion with improved lung aeration. No pneumothorax. Right pleural effusion has also decreased with improved right basilar aeration. Electronically Signed   By: Guadlupe Spanish M.D.   On: 10/10/2021 14:51   DG Chest Port 1 View  Result Date: 10/11/2021 CLINICAL DATA:  Follow-up pleural effusions. EXAM: PORTABLE CHEST 1 VIEW COMPARISON:  10/10/2021 FINDINGS: Stable cardiomediastinal contours. Bilateral pleural effusions are again noted left greater than right. Compared with the previous  exam there is been interval increase in left pleural effusion. There is mild interstitial edema. Progressive pulmonary opacities are identified within both mid and lower lung zones, left greater than right. IMPRESSION: 1. Bilateral pleural effusions, left greater than right. 2. Mild interstitial edema with progressive bilateral pulmonary opacities, left greater than right. Electronically Signed   By: Signa Kell M.D.   On: 10/11/2021 06:35   DG Chest Portable 1 View  Result Date: 10/10/2021 CLINICAL DATA:  Increased short of breath EXAM: PORTABLE CHEST 1 VIEW COMPARISON:  None. FINDINGS: Cardiac silhouette is obscured by a large LEFT pleural effusion. Effusion is increased significantly from comparison exam and now occupies 2/3 of the LEFT hemithorax. Small RIGHT effusion is also increased. Mild airspace disease in RIGHT lower lobe. IMPRESSION: 1. Markedly increased large LEFT pleural effusion. 2. Small RIGHT effusion and probable RIGHT lower lobe edema. Electronically Signed   By: Genevive Bi M.D.   On: 10/10/2021 07:36   ECHOCARDIOGRAM COMPLETE  Result Date: 10/10/2021    ECHOCARDIOGRAM REPORT   Patient Name:   DEMYA SCRUGGS Date of Exam: 10/10/2021 Medical Rec #:  [id number]             Height:       63.0 in Accession #:    [id number]            Weight:       125.1 lb Date of Birth:  October 02, 1932             BSA:          1.584 m Patient Age:    89 years              BP:  87/66 mmHg Patient Gender: F                     HR:           81 bpm. 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 Risk Factors:Hypertension and Dyslipidemia.  Sonographer:    Bernadene Person RDCS Referring Phys: [place]  1. Normal LV function; mild AI; severe MR; elevated mean gradient  across MV of 5 mmHg likely at least partially related to severe MR; moderate TR.  2. Left ventricular ejection fraction, by estimation, is 60 to 65%. The left ventricle has normal function. The left ventricle has no regional wall motion abnormalities. Left ventricular diastolic parameters are indeterminate.  3. Right ventricular systolic function is normal. The right ventricular size is normal. There is moderately elevated pulmonary artery systolic pressure.  4. Left atrial size was moderately dilated.  5. Large pleural effusion in the left lateral region.  6. The mitral valve is abnormal. Severe mitral valve regurgitation. Mild mitral stenosis.  7. Tricuspid valve regurgitation is moderate.  8. The aortic valve has an indeterminant number of cusps. Aortic valve regurgitation is mild. No aortic stenosis is present.  9. The inferior vena cava is normal in size with <50% respiratory variability, suggesting right atrial pressure of 8 mmHg. FINDINGS  Left Ventricle: Left ventricular ejection fraction, by estimation, is 60 to 65%. The left ventricle has normal function. The left ventricle has no regional wall motion abnormalities. The left ventricular internal cavity size was normal in size. There is  no left ventricular hypertrophy. Left ventricular diastolic parameters are indeterminate. Right Ventricle: The right ventricular size is normal. Right ventricular systolic function is normal. There is moderately elevated pulmonary artery systolic pressure. The tricuspid regurgitant velocity is 3.40 m/s, and with an assumed right atrial pressure of 8 mmHg, the estimated right ventricular systolic pressure is 50.9 mmHg. Left Atrium: Left atrial size was moderately dilated. Right Atrium: Right atrial size was normal in size. Pericardium: Trivial pericardial effusion is present. Mitral Valve: The mitral valve is abnormal. There is mild thickening of the mitral valve leaflet(s). Severe mitral valve regurgitation. Mild mitral  valve stenosis. Tricuspid Valve: The tricuspid valve is normal in structure. Tricuspid valve regurgitation is moderate . No evidence of tricuspid stenosis. Aortic Valve: The aortic valve has an indeterminant number of cusps. Aortic valve regurgitation is mild. Aortic regurgitation PHT measures 242 msec. No aortic stenosis is present. Pulmonic Valve: The pulmonic valve was grossly normal. Pulmonic valve regurgitation is not visualized. No evidence of pulmonic stenosis. Aorta: The aortic root is normal in size and structure. Venous: The inferior vena cava is normal in size with less than 50% respiratory variability, suggesting right atrial pressure of 8 mmHg. IAS/Shunts: No atrial level shunt detected by color flow Doppler. Additional Comments: Normal LV function; mild AI; severe MR; elevated mean gradient across MV of 5 mmHg likely at least partially related to severe MR; moderate TR. There is a large pleural effusion in the left lateral region.  LEFT VENTRICLE PLAX 2D LVIDd:         3.60 cm LVIDs:         2.70 cm LV PW:  0.90 cm LV IVS:        0.90 cm LVOT diam:     1.50 cm LV SV:         25 LV SV Index:   16 LVOT Area:     1.77 cm  LV Volumes (MOD) LV vol d, MOD A2C: 49.8 ml LV vol d, MOD A4C: 65.4 ml LV vol s, MOD A2C: 26.8 ml LV vol s, MOD A4C: 42.0 ml LV SV MOD A2C:     23.0 ml LV SV MOD A4C:     65.4 ml LV SV MOD BP:      22.0 ml RIGHT VENTRICLE TAPSE (M-mode): 1.1 cm LEFT ATRIUM             Index        RIGHT ATRIUM           Index LA diam:        2.70 cm 1.70 cm/m   RA Area:     14.50 cm LA Vol (A2C):   47.4 ml 29.92 ml/m  RA Volume:   35.80 ml  22.60 ml/m LA Vol (A4C):   44.6 ml 28.16 ml/m LA Biplane Vol: 50.9 ml 32.13 ml/m  AORTIC VALVE LVOT Vmax:   65.60 cm/s LVOT Vmean:  43.400 cm/s LVOT VTI:    0.140 m AI PHT:      242 msec  AORTA Ao Root diam: 3.00 cm Ao Asc diam:  3.00 cm MR Peak grad:    63.8 mmHg    TRICUSPID VALVE MR Mean grad:    44.0 mmHg    TR Peak grad:   46.2 mmHg MR Vmax:          399.50 cm/s  TR Vmax:        340.00 cm/s MR Vmean:        314.0 cm/s MR PISA:         2.26 cm     SHUNTS MR PISA Eff ROA: 22 mm       Systemic VTI:  0.14 m MR PISA Radius:  0.60 cm      Systemic Diam: 1.50 cm Kirk Ruths MD Electronically signed by Kirk Ruths MD Signature Date/Time: 10/10/2021/2:04:01 PM    Final    VAS [place] LOWER EXTREMITY VENOUS (DVT)  Result Date: 10/11/2021  Lower Venous DVT Study Patient Name:  JAMICA WOODYARD  Date of Exam:   10/10/2021 Medical Rec #: [id number]              Accession #:    [id number] Date of Birth: 10/27/1932              Patient Gender: F Patient Age:   87 years Exam Location:  [facility] Procedure:      VAS [place] LOWER EXTREMITY VENOUS (DVT) Referring Phys: Emmilynn Marut --------------------------------------------------------------------------------  Indications: Edema, and history of DVT.  Comparison Study: 04/25/2021 bilateral lower extremity venous duplex- acute DVT                   right soleal veins and left PTV. Performing Technologist: Maudry Mayhew MHA, RDMS, RVT, RDCS  Examination Guidelines: A complete evaluation includes B-mode imaging, spectral Doppler, color Doppler, and power Doppler as needed of all accessible portions of each vessel. Bilateral testing is considered an integral part of a complete examination. Limited examinations for reoccurring indications may be performed as noted. The reflux portion of the exam is performed with the patient in reverse Trendelenburg.  +---------+---------------+---------+-----------+----------+--------------+ RIGHT  CompressibilityPhasicitySpontaneityPropertiesThrombus Aging +---------+---------------+---------+-----------+----------+--------------+ CFV      Full           No       Yes                                 +---------+---------------+---------+-----------+----------+--------------+ SFJ      Full                                                         +---------+---------------+---------+-----------+----------+--------------+ FV Prox  Full                                                        +---------+---------------+---------+-----------+----------+--------------+ FV Mid   Full                                                        +---------+---------------+---------+-----------+----------+--------------+ FV DistalFull                                                        +---------+---------------+---------+-----------+----------+--------------+ PFV      Full                                                        +---------+---------------+---------+-----------+----------+--------------+ POP      Full           No       Yes                                 +---------+---------------+---------+-----------+----------+--------------+ PTV      Full                                                        +---------+---------------+---------+-----------+----------+--------------+ PERO     Full                                                        +---------+---------------+---------+-----------+----------+--------------+   Right Technical Findings: Not visualized segments include limited visualization PTV and peroneal veins.  +---------+---------------+---------+-----------+----------+--------------+ LEFT     CompressibilityPhasicitySpontaneityPropertiesThrombus Aging +---------+---------------+---------+-----------+----------+--------------+ CFV      Full           No       Yes                                 +---------+---------------+---------+-----------+----------+--------------+  SFJ      Full                                                        +---------+---------------+---------+-----------+----------+--------------+ FV Prox  Full                                                        +---------+---------------+---------+-----------+----------+--------------+ FV Mid   Full                                                         +---------+---------------+---------+-----------+----------+--------------+ FV DistalFull                                                        +---------+---------------+---------+-----------+----------+--------------+ PFV      Full                                                        +---------+---------------+---------+-----------+----------+--------------+ POP      Full           No       Yes                                 +---------+---------------+---------+-----------+----------+--------------+   Left Technical Findings: Not visualized segments include PTV, peroneal veins.   Summary: RIGHT: - There is no evidence of deep vein thrombosis in the lower extremity. However, portions of this examination were limited- see technologist comments above.  - No cystic structure found in the popliteal fossa.  LEFT: - There is no evidence of deep vein thrombosis in the lower extremity. However, portions of this examination were limited- see technologist comments above.  - No cystic structure found in the popliteal fossa.  Bilateral lower extremity veins exhibit pulsatile flow, suggestive of possibly elevated right heart pressure.  *See table(s) above for measurements and observations. Electronically signed by Deitra Mayo MD on 10/11/2021 at 5:14:55 AM.    Final    US THORACENTESIS ASP PLEURAL SPACE W/IMG GUIDE  Result Date: 10/10/2021 INDICATION: Shortness of breath with hypoxia. Large left pleural effusion. Request for diagnostic and therapeutic thoracentesis, some maximum of 1.5 L. EXAM: ULTRASOUND GUIDED LEFT THORACENTESIS MEDICATIONS: 1% plain lidocaine, 5 mL COMPLICATIONS: None immediate. PROCEDURE: An ultrasound guided thoracentesis was thoroughly discussed with the patient and questions answered. The benefits, risks, alternatives and complications were also discussed. The patient understands and wishes to proceed with the  procedure. Written consent was obtained. Ultrasound was performed to localize and mark an adequate pocket of fluid in the left chest. The area was then prepped and draped in the  normal sterile fashion. 1% Lidocaine was used for local anesthesia. Under ultrasound guidance a 6 Fr Safe-T-Centesis catheter was introduced. Thoracentesis was performed. The catheter was removed and a dressing applied. FINDINGS: A total of approximately 1.5 L of clear yellow fluid was removed. Samples were sent to the laboratory as requested by the clinical team. IMPRESSION: Successful ultrasound guided left thoracentesis yielding 1.5 L of pleural fluid. Read by: Ascencion Dike PA-C Electronically Signed   By: Sandi Mariscal M.D.   On: 10/10/2021 14:49

## 2021-10-11 NOTE — Progress Notes (Signed)
Progress Note  Patient Name: Jillian Cooley Date of Encounter: 10/11/2021  [facility] Cardiologist: Werner Lean, MD   Subjective   SOB improved after thoracentesis  Inpatient Medications    Scheduled Meds:  Chlorhexidine Gluconate Cloth  6 each Topical Daily   famotidine  20 mg Oral Daily   hydrocortisone sod succinate (SOLU-CORTEF) inj  50 mg Intravenous Q6H   mouth rinse  15 mL Mouth Rinse BID   Continuous Infusions:  sodium chloride Stopped (10/10/21 1902)   sodium chloride Stopped (10/10/21 2039)   azithromycin Stopped (10/10/21 2004)   cefTRIAXone (ROCEPHIN)  IV Stopped (10/10/21 2050)   heparin 750 Units/hr (10/11/21 0631)   phenylephrine (NEO-SYNEPHRINE) Adult infusion Stopped (10/10/21 2059)   sodium bicarbonate 150 mEq in D5W infusion 50 mL/hr at 10/11/21 0631   PRN Meds: sodium chloride, docusate sodium, polyethylene glycol   Vital Signs    Vitals:   10/11/21 0343 10/11/21 0400 10/11/21 0500 10/11/21 0600  BP:  102/64 (!) 82/52 (!) 89/52  Pulse:  91 86 86  Resp:  $Remo'15 15 14  'QqvlW$ Temp:      TempSrc:      SpO2:  98% 98% 99%  Weight: 56.1 kg     Height:        Intake/Output Summary (Last 24 hours) at 10/11/2021 0749 Last data filed at 10/11/2021 0631 Gross per 24 hour  Intake 1594.96 ml  Output 260 ml  Net 1334.96 ml   Last 3 Weights 10/11/2021 10/10/2021 09/30/2021  Weight (lbs) 123 lb 10.9 oz 118 lb 13.3 oz 125 lb 1.6 oz  Weight (kg) 56.1 kg 53.9 kg 56.745 kg      Telemetry    SR - Personally Reviewed  ECG    N/a - Personally Reviewed  Physical Exam   GEN: No acute distress.   Neck: No JVD Cardiac: RRR, 3/6 systolic murmur pex Respiratory: Clear to auscultation bilaterally. GI: Soft, nontender, non-distended  MS: No edema; No deformity. Neuro:  Nonfocal  Psych: Normal affect   Labs    High Sensitivity Troponin:   Recent Labs  Lab 10/10/21 0731 10/10/21 0935 10/10/21 1548 10/10/21 1822  TROPONINIHS 523*  836* 3,307* 5,378*     Chemistry Recent Labs  Lab 10/10/21 0731 10/10/21 1548 10/11/21 0333  NA 136 138 138  K 4.6 4.2 4.1  CL 103 107 104  CO2 19* 18* 24  GLUCOSE 322* 105* 152*  BUN 74* 73* 79*  CREATININE 2.43* 1.93* 2.53*  CALCIUM 9.6 9.0 8.9  MG  --  2.3 2.2  PROT 6.9 6.0*  --   ALBUMIN 3.8 3.4*  --   AST 33 32  --   ALT 34 30  --   ALKPHOS 56 45  --   BILITOT 0.7 0.8  --   GFRNONAA 19* 25* 18*  ANIONGAP $RemoveB'14 13 10    'VXzaYMjx$ Lipids No results for input(s): CHOL, TRIG, HDL, LABVLDL, LDLCALC, CHOLHDL in the last 168 hours.  Hematology Recent Labs  Lab 10/10/21 0731 10/10/21 1548 10/11/21 0333  WBC 10.1 10.9* 9.9  RBC 3.89 3.62* 3.53*  HGB 11.5* 10.6* 10.4*  HCT 37.4 34.2* 32.7*  MCV 96.1 94.5 92.6  MCH 29.6 29.3 29.5  MCHC 30.7 31.0 31.8  RDW 14.8 14.8 15.0  PLT 311 218 213   Thyroid No results for input(s): TSH, FREET4 in the last 168 hours.  BNP Recent Labs  Lab 10/10/21 0731  BNP 1,070.9*    DDimer No results for input(s):  DDIMER in the last 168 hours.   Radiology    DG Chest 1 View  Result Date: 10/10/2021 CLINICAL DATA:  Post thoracentesis EXAM: CHEST  1 VIEW COMPARISON:  Earlier same day FINDINGS: Decreased left pleural effusion with improved left lung aeration. Right pleural effusion is also decreased with improved right basilar aeration. Stable interstitial prominence. No pneumothorax. Normal heart size for technique. IMPRESSION: Significantly decreased left pleural effusion with improved lung aeration. No pneumothorax. Right pleural effusion has also decreased with improved right basilar aeration. Electronically Signed   By: Macy Mis M.D.   On: 10/10/2021 14:51   DG Chest Port 1 View  Result Date: 10/11/2021 CLINICAL DATA:  Follow-up pleural effusions. EXAM: PORTABLE CHEST 1 VIEW COMPARISON:  10/10/2021 FINDINGS: Stable cardiomediastinal contours. Bilateral pleural effusions are again noted left greater than right. Compared with the previous exam  there is been interval increase in left pleural effusion. There is mild interstitial edema. Progressive pulmonary opacities are identified within both mid and lower lung zones, left greater than right. IMPRESSION: 1. Bilateral pleural effusions, left greater than right. 2. Mild interstitial edema with progressive bilateral pulmonary opacities, left greater than right. Electronically Signed   By: Kerby Moors M.D.   On: 10/11/2021 06:35   DG Chest Portable 1 View  Result Date: 10/10/2021 CLINICAL DATA:  Increased short of breath EXAM: PORTABLE CHEST 1 VIEW COMPARISON:  None. FINDINGS: Cardiac silhouette is obscured by a large LEFT pleural effusion. Effusion is increased significantly from comparison exam and now occupies 2/3 of the LEFT hemithorax. Small RIGHT effusion is also increased. Mild airspace disease in RIGHT lower lobe. IMPRESSION: 1. Markedly increased large LEFT pleural effusion. 2. Small RIGHT effusion and probable RIGHT lower lobe edema. Electronically Signed   By: Suzy Bouchard M.D.   On: 10/10/2021 07:36   ECHOCARDIOGRAM COMPLETE  Result Date: 10/10/2021    ECHOCARDIOGRAM REPORT   Patient Name:   Jillian Cooley Date of Exam: 10/10/2021 Medical Rec #:  [id number]             Height:       63.0 in Accession #:    [id number]            Weight:       125.1 lb Date of Birth:  [birth year]             BSA:          1.584 m Patient Age:    87 years              BP:           87/66 mmHg Patient Gender: F                     HR:           81 bpm. 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 Risk Factors:Hypertension and Dyslipidemia.  Sonographer:    Bernadene Person RDCS Referring Phys: [place]  1. Normal LV function; mild AI; severe MR; elevated mean gradient across  MV of 5 mmHg likely at least partially related to severe MR; moderate TR.  2. Left ventricular ejection fraction, by estimation, is 60  to 65%. The left ventricle has normal function. The left ventricle has no regional wall motion abnormalities. Left ventricular diastolic parameters are indeterminate.  3. Right ventricular systolic function is normal. The right ventricular size is normal. There is moderately elevated pulmonary artery systolic pressure.  4. Left atrial size was moderately dilated.  5. Large pleural effusion in the left lateral region.  6. The mitral valve is abnormal. Severe mitral valve regurgitation. Mild mitral stenosis.  7. Tricuspid valve regurgitation is moderate.  8. The aortic valve has an indeterminant number of cusps. Aortic valve regurgitation is mild. No aortic stenosis is present.  9. The inferior vena cava is normal in size with <50% respiratory variability, suggesting right atrial pressure of 8 mmHg. FINDINGS  Left Ventricle: Left ventricular ejection fraction, by estimation, is 60 to 65%. The left ventricle has normal function. The left ventricle has no regional wall motion abnormalities. The left ventricular internal cavity size was normal in size. There is  no left ventricular hypertrophy. Left ventricular diastolic parameters are indeterminate. Right Ventricle: The right ventricular size is normal. Right ventricular systolic function is normal. There is moderately elevated pulmonary artery systolic pressure. The tricuspid regurgitant velocity is 3.40 m/s, and with an assumed right atrial pressure of 8 mmHg, the estimated right ventricular systolic pressure is 36.6 mmHg. Left Atrium: Left atrial size was moderately dilated. Right Atrium: Right atrial size was normal in size. Pericardium: Trivial pericardial effusion is present. Mitral Valve: The mitral valve is abnormal. There is mild thickening of the mitral valve leaflet(s). Severe mitral valve regurgitation. Mild mitral valve  stenosis. Tricuspid Valve: The tricuspid valve is normal in structure. Tricuspid valve regurgitation is moderate . No evidence of tricuspid stenosis. Aortic Valve: The aortic valve has an indeterminant number of cusps. Aortic valve regurgitation is mild. Aortic regurgitation PHT measures 242 msec. No aortic stenosis is present. Pulmonic Valve: The pulmonic valve was grossly normal. Pulmonic valve regurgitation is not visualized. No evidence of pulmonic stenosis. Aorta: The aortic root is normal in size and structure. Venous: The inferior vena cava is normal in size with less than 50% respiratory variability, suggesting right atrial pressure of 8 mmHg. IAS/Shunts: No atrial level shunt detected by color flow Doppler. Additional Comments: Normal LV function; mild AI; severe MR; elevated mean gradient across MV of 5 mmHg likely at least partially related to severe MR; moderate TR. There is a large pleural effusion in the left lateral region.  LEFT VENTRICLE PLAX 2D LVIDd:         3.60 cm LVIDs:         2.70 cm LV PW:         0.90 cm LV IVS:        0.90 cm LVOT diam:     1.50 cm LV SV:         25 LV SV Index:   16 LVOT Area:     1.77 cm  LV Volumes (MOD) LV vol d, MOD A2C: 49.8 ml LV vol d, MOD A4C: 65.4 ml LV vol s, MOD A2C: 26.8 ml LV vol s, MOD A4C: 42.0 ml LV SV MOD A2C:     23.0 ml LV SV MOD A4C:     65.4 ml LV SV MOD BP:      22.0 ml RIGHT VENTRICLE TAPSE (M-mode): 1.1 cm LEFT ATRIUM             Index        RIGHT ATRIUM  Index LA diam:        2.70 cm 1.70 cm/m   RA Area:     14.50 cm LA Vol (A2C):   47.4 ml 29.92 ml/m  RA Volume:   35.80 ml  22.60 ml/m LA Vol (A4C):   44.6 ml 28.16 ml/m LA Biplane Vol: 50.9 ml 32.13 ml/m  AORTIC VALVE LVOT Vmax:   65.60 cm/s LVOT Vmean:  43.400 cm/s LVOT VTI:    0.140 m AI PHT:      242 msec  AORTA Ao Root diam: 3.00 cm Ao Asc diam:  3.00 cm MR Peak grad:    63.8 mmHg    TRICUSPID VALVE MR Mean grad:    44.0 mmHg    TR Peak grad:   46.2 mmHg MR Vmax:          399.50 cm/s  TR Vmax:        340.00 cm/s MR Vmean:        314.0 cm/s MR PISA:         2.26 cm     SHUNTS MR PISA Eff ROA: 22 mm       Systemic VTI:  0.14 m MR PISA Radius:  0.60 cm      Systemic Diam: 1.50 cm Kirk Ruths MD Electronically signed by Kirk Ruths MD Signature Date/Time: 10/10/2021/2:04:01 PM    Final    VAS [place] LOWER EXTREMITY VENOUS (DVT)  Result Date: 10/11/2021  Lower Venous DVT Study Patient Name:  Jillian Cooley  Date of Exam:   10/10/2021 Medical Rec #: [id number]              Accession #:    [id number] Date of Birth: December 31, 1931              Patient Gender: F Patient Age:   87 years Exam Location:  [facility] Procedure:      VAS [place] LOWER EXTREMITY VENOUS (DVT) Referring Phys: MURALI RAMASWAMY --------------------------------------------------------------------------------  Indications: Edema, and history of DVT.  Comparison Study: 04/25/2021 bilateral lower extremity venous duplex- acute DVT                   right soleal veins and left PTV. Performing Technologist: Maudry Mayhew MHA, RDMS, RVT, RDCS  Examination Guidelines: A complete evaluation includes B-mode imaging, spectral Doppler, color Doppler, and power Doppler as needed of all accessible portions of each vessel. Bilateral testing is considered an integral part of a complete examination. Limited examinations for reoccurring indications may be performed as noted. The reflux portion of the exam is performed with the patient in reverse Trendelenburg.  +---------+---------------+---------+-----------+----------+--------------+ RIGHT    CompressibilityPhasicitySpontaneityPropertiesThrombus Aging +---------+---------------+---------+-----------+----------+--------------+ CFV      Full           No       Yes                                 +---------+---------------+---------+-----------+----------+--------------+ SFJ      Full                                                         +---------+---------------+---------+-----------+----------+--------------+ FV Prox  Full                                                        +---------+---------------+---------+-----------+----------+--------------+  FV Mid   Full                                                        +---------+---------------+---------+-----------+----------+--------------+ FV DistalFull                                                        +---------+---------------+---------+-----------+----------+--------------+ PFV      Full                                                        +---------+---------------+---------+-----------+----------+--------------+ POP      Full           No       Yes                                 +---------+---------------+---------+-----------+----------+--------------+ PTV      Full                                                        +---------+---------------+---------+-----------+----------+--------------+ PERO     Full                                                        +---------+---------------+---------+-----------+----------+--------------+   Right Technical Findings: Not visualized segments include limited visualization PTV and peroneal veins.  +---------+---------------+---------+-----------+----------+--------------+ LEFT     CompressibilityPhasicitySpontaneityPropertiesThrombus Aging +---------+---------------+---------+-----------+----------+--------------+ CFV      Full           No       Yes                                 +---------+---------------+---------+-----------+----------+--------------+ SFJ      Full                                                        +---------+---------------+---------+-----------+----------+--------------+ FV Prox  Full                                                        +---------+---------------+---------+-----------+----------+--------------+ FV Mid   Full                                                         +---------+---------------+---------+-----------+----------+--------------+  FV DistalFull                                                        +---------+---------------+---------+-----------+----------+--------------+ PFV      Full                                                        +---------+---------------+---------+-----------+----------+--------------+ POP      Full           No       Yes                                 +---------+---------------+---------+-----------+----------+--------------+   Left Technical Findings: Not visualized segments include PTV, peroneal veins.   Summary: RIGHT: - There is no evidence of deep vein thrombosis in the lower extremity. However, portions of this examination were limited- see technologist comments above.  - No cystic structure found in the popliteal fossa.  LEFT: - There is no evidence of deep vein thrombosis in the lower extremity. However, portions of this examination were limited- see technologist comments above.  - No cystic structure found in the popliteal fossa.  Bilateral lower extremity veins exhibit pulsatile flow, suggestive of possibly elevated right heart pressure.  *See table(s) above for measurements and observations. Electronically signed by Deitra Mayo MD on 10/11/2021 at 5:14:55 AM.    Final    US THORACENTESIS ASP PLEURAL SPACE W/IMG GUIDE  Result Date: 10/10/2021 INDICATION: Shortness of breath with hypoxia. Large left pleural effusion. Request for diagnostic and therapeutic thoracentesis, some maximum of 1.5 L. EXAM: ULTRASOUND GUIDED LEFT THORACENTESIS MEDICATIONS: 1% plain lidocaine, 5 mL COMPLICATIONS: None immediate. PROCEDURE: An ultrasound guided thoracentesis was thoroughly discussed with the patient and questions answered. The benefits, risks, alternatives and complications were also discussed. The patient understands and wishes to proceed with the  procedure. Written consent was obtained. Ultrasound was performed to localize and mark an adequate pocket of fluid in the left chest. The area was then prepped and draped in the normal sterile fashion. 1% Lidocaine was used for local anesthesia. Under ultrasound guidance a 6 Fr Safe-T-Centesis catheter was introduced. Thoracentesis was performed. The catheter was removed and a dressing applied. FINDINGS: A total of approximately 1.5 L of clear yellow fluid was removed. Samples were sent to the laboratory as requested by the clinical team. IMPRESSION: Successful ultrasound guided left thoracentesis yielding 1.5 L of pleural fluid. Read by: Ascencion Dike PA-C Electronically Signed   By: Sandi Mariscal M.D.   On: 10/10/2021 14:49    Cardiac Studies    Patient Profile     Brynlei Klausner is a 87 y.o. female with a hx of hypertension, hyperlipidemia, valvular heart disease (aortic regurgitation, mitral regurgitation and tricuspid regurgitation, chronic kidney disease stage III, carpal tunnel syndrome, pulmonary embolism, multiple myeloma, and pleural effusion s/p thoracentesis who is being seen 10/10/2021 for the evaluation of elevated troponin at the request of Dr. Almyra Free.  Assessment & Plan    1.Elevated troponin - trop up to 5300, EKG without specific ischemic changes - 09/2021 echo LVEF 60-65%, no WMAs,  indet diastolic function, normal RV, mod elevated PASP, mod severe MR, mild AI, mod TR - she is on heparin already for history of prior PE with her home eliquis on hold.   - likely large component of demand ishcemia in the setting of hypoxia and hypotension. Given degree of trop elevation may be some underlying CAD, her renal function with limit cath consideration - continue hep gtt, while off eliquis start ASA $RemoveB'81mg'jvjKwSLY$  daily. BP's would prohibit beta blocker or ACE/ARB. Send lipid panel.    2.Valvular heart disease -04/2021 echo mod MR possibly underestimated, mild to mod AI, mild TR - 09/2021 echo  mod to sevre MR, mild AI - could consider TEE as outpatient in the future depending on clinical course of her comorbidities, unclear candidacy for potential intervention.    3. Pleural effusion -issues with recurrent pleural effusions, prior thoracentesis - thoracentesis this admission with 1.5 L removed, significant improvement in SOB   4. Multiple myeloma   5. AKI   on CKD - baseline Cr 1.7-1.8 - Cr up to 2.53 this admission  6. History of prior PE  - has been on home eliquis  7.Hypotension - hypotensive on admission. Lactic acid 4.3, procalcitonin <0.10 - primarily responded to IVFs, phenylephrien written for but appears did not have to be started.  - echo shows normal LVEF, RV limited visualization but function looks fairly normal. Her MR is chronic and would not be etiology of her hypotension. She is on stress dose steroids currenty, has been on steroids at home.    For questions or updates, please contact [place] Please consult [email or web address] for contact info under        Signed, Carlyle Dolly, MD  10/11/2021, 7:49 AM

## 2021-10-11 NOTE — Plan of Care (Signed)

## 2021-10-11 NOTE — Progress Notes (Signed)
ANTICOAGULATION CONSULT NOTE - follow up  Pharmacy Consult for Heparin Indication: chest pain/ACS  Allergies  Allergen Reactions   Sulfa Antibiotics Nausea Only    Patient Measurements: Height: 5\' 3"  (160 cm) Weight: 56.1 kg (123 lb 10.9 oz) IBW/kg (Calculated) : 52.4 Heparin Dosing Weight: TBW  Vital Signs: Temp: 97.5 F (36.4 C) (10/22 0300) Temp Source: Oral (10/22 0300) BP: 102/64 (10/22 0400) Pulse Rate: 91 (10/22 0400)  Labs: Recent Labs    10/10/21 0731 10/10/21 0935 10/10/21 1130 10/10/21 1548 10/10/21 1822 10/10/21 1920 10/11/21 0333  HGB 11.5*  --   --  10.6*  --   --  10.4*  HCT 37.4  --   --  34.2*  --   --  32.7*  PLT 311  --   --  218  --   --  213  APTT  --   --  29  --   --  58* 78*  LABPROT 19.7*  --   --   --   --   --   --   INR 1.7*  --   --   --   --   --   --   HEPARINUNFRC  --   --   --   --   --  >1.10* >1.10*  CREATININE 2.43*  --   --  1.93*  --   --  2.53*  TROPONINIHS 523* 836*  --  3,307* 5,378*  --   --      Estimated Creatinine Clearance: 13 mL/min (A) (by C-G formula based on SCr of 2.53 mg/dL (H)).   Medical History: Past Medical History:  Diagnosis Date   Abnormal EKG    Carpal tunnel syndrome    CKD (chronic kidney disease), stage III (HCC)    DDD (degenerative disc disease), lumbar    Hypertension    Hypertension with renal disease    Menopause    Mixed hyperlipidemia    Muscular degeneration    Nonrheumatic aortic (valve) insufficiency    Nonrheumatic mitral valve regurgitation    Osteoarthritis    Pedal edema    Pleural effusion    Skin cancer    Vitamin D deficiency     Medications:  Scheduled:   Chlorhexidine Gluconate Cloth  6 each Topical Daily   famotidine  20 mg Oral Daily   hydrocortisone sod succinate (SOLU-CORTEF) inj  50 mg Intravenous Q6H   mouth rinse  15 mL Mouth Rinse BID   Infusions:   sodium chloride Stopped (10/10/21 1902)   sodium chloride Stopped (10/10/21 2039)   azithromycin  Stopped (10/10/21 2004)   cefTRIAXone (ROCEPHIN)  IV Stopped (10/10/21 2050)   heparin 750 Units/hr (10/11/21 0000)   phenylephrine (NEO-SYNEPHRINE) Adult infusion Stopped (10/10/21 2059)   sodium bicarbonate 150 mEq in D5W infusion 50 mL/hr at 10/11/21 0000    Assessment: 73 yoF presented to ED with ShOB.  Heparin per pharmacy for ACS.  PTA Eliquis held, last dose 10/20 at 18:00 Troponin: 523, 836 >> 5378 CBC:  Hgb 11.5, Plt WNL Baseline aPTT 29 seconds, INR 1.7  10/11/21 PM: -aPTT therapeutic at 78 seconds, HL elevated >1.1 as expected with recent apixaban administration - Hgb 10.4, Plts WNL - Scr up to 2.53 -No issues with line or bleeding per RN  Goal of Therapy:  Heparin level 0.3-0.7 units/ml aPTT 66-102 seconds Monitor platelets by anticoagulation protocol: Yes   Plan:  Continue heparin drip at 750 units/hr Confirmatory level in 8 hours Daily  heparin level and CBC Continue to monitor H&H and platelets  Dolly Rias RPh 10/11/2021, 4:54 AM

## 2021-10-11 NOTE — Progress Notes (Signed)
ANTICOAGULATION CONSULT NOTE - follow up  Pharmacy Consult for Heparin Indication: chest pain/ACS  Allergies  Allergen Reactions   Sulfa Antibiotics Nausea Only    Patient Measurements: Height: 5\' 3"  (160 cm) Weight: 56.1 kg (123 lb 10.9 oz) IBW/kg (Calculated) : 52.4 Heparin Dosing Weight: TBW  Vital Signs: Temp: 97.6 F (36.4 C) (10/22 1138) Temp Source: Oral (10/22 0814) BP: 133/75 (10/22 1200) Pulse Rate: 95 (10/22 1200)  Labs: Recent Labs    10/10/21 0731 10/10/21 0935 10/10/21 1130 10/10/21 1548 10/10/21 1822 10/10/21 1920 10/11/21 0333 10/11/21 1246  HGB 11.5*  --   --  10.6*  --   --  10.4*  --   HCT 37.4  --   --  34.2*  --   --  32.7*  --   PLT 311  --   --  218  --   --  213  --   APTT  --   --    < >  --   --  58* 78* 70*  LABPROT 19.7*  --   --   --   --   --   --   --   INR 1.7*  --   --   --   --   --   --   --   HEPARINUNFRC  --   --   --   --   --  >1.10* >1.10*  --   CREATININE 2.43*  --   --  1.93*  --   --  2.53*  --   TROPONINIHS 523* 836*  --  3,307* 5,378*  --   --   --    < > = values in this interval not displayed.     Estimated Creatinine Clearance: 13 mL/min (A) (by C-G formula based on SCr of 2.53 mg/dL (H)).   Medical History: Past Medical History:  Diagnosis Date   Abnormal EKG    Carpal tunnel syndrome    CKD (chronic kidney disease), stage III (HCC)    DDD (degenerative disc disease), lumbar    Hypertension    Hypertension with renal disease    Menopause    Mixed hyperlipidemia    Muscular degeneration    Nonrheumatic aortic (valve) insufficiency    Nonrheumatic mitral valve regurgitation    Osteoarthritis    Pedal edema    Pleural effusion    Skin cancer    Vitamin D deficiency     Medications:  Scheduled:   aspirin EC  81 mg Oral Daily   Chlorhexidine Gluconate Cloth  6 each Topical Daily   famotidine  20 mg Oral Daily   hydrocortisone sod succinate (SOLU-CORTEF) inj  50 mg Intravenous Q6H   mouth rinse  15  mL Mouth Rinse BID   Infusions:   sodium chloride Stopped (10/10/21 1902)   sodium chloride Stopped (10/10/21 2039)   heparin 750 Units/hr (10/11/21 1241)    Assessment: 30 yoF presented to ED with ShOB.  Heparin per pharmacy for ACS.  PTA Eliquis held, last dose 10/20 at 18:00 Troponin: 523, 836 >> 5378 CBC:  Hgb 11.5, Plt WNL Baseline aPTT 29 seconds, INR 1.7  aPTT remains therapeutic on current IV heparin rate of 750 units/hr No issues with line or bleeding per RN CBC stable  Goal of Therapy:  Heparin level 0.3-0.7 units/ml aPTT 66-102 seconds Monitor platelets by anticoagulation protocol: Yes   Plan:  Continue heparin drip at 750 units/hr Recheck aPTT in AM Daily heparin level  and CBC Continue to monitor H&H and platelets   Adrian Saran, PharmD, BCPS Secure Chat if ?s 10/11/2021 1:28 PM

## 2021-10-11 NOTE — Progress Notes (Signed)
MAP BP 150/95 MAP  per RN  Plan  - reduce hydrocort  - increas lasix from 20mg  first dose today to 40mg  IV Q12h    SIGNATURE    Dr. Brand Males, M.D., F.C.C.P,  Pulmonary and Critical Care Medicine Staff Physician, [facility] Director - Interstitial Lung Disease  Program  Pulmonary [facility] [place], [place], [address]  NPI Number:  NPI #[id number]  Pager: [phone], If no answer  -> Check AMION or Try [phone] Telephone (clinical office): [phone] Telephone (research): [phone]  6:11 PM 10/11/2021

## 2021-10-11 NOTE — Progress Notes (Signed)
Pt called RN into room. Pt stated feeling wheezing & hard to catch breath. RN raised O2 from 2 to 4L. RN practiced taking breaths with pt & raised the head of bed upright. RN heard wheezing in upper lungs. Pt looked labored. RN called Dr. Chase Caller to explain situation. Per verbal MD orders, order chest xray, give the 40mg  laxis, & put pt on bipap for the night if wheezing get worse. Also cancel transfer & placed pt back onto icu status

## 2021-10-12 ENCOUNTER — Inpatient Hospital Stay (HOSPITAL_COMMUNITY): Payer: Medicare PPO

## 2021-10-12 DIAGNOSIS — J9621 Acute and chronic respiratory failure with hypoxia: Secondary | ICD-10-CM | POA: Diagnosis not present

## 2021-10-12 DIAGNOSIS — L8990 Pressure ulcer of unspecified site, unspecified stage: Secondary | ICD-10-CM | POA: Insufficient documentation

## 2021-10-12 DIAGNOSIS — N189 Chronic kidney disease, unspecified: Secondary | ICD-10-CM

## 2021-10-12 DIAGNOSIS — J9601 Acute respiratory failure with hypoxia: Secondary | ICD-10-CM

## 2021-10-12 DIAGNOSIS — J90 Pleural effusion, not elsewhere classified: Secondary | ICD-10-CM

## 2021-10-12 DIAGNOSIS — R0600 Dyspnea, unspecified: Secondary | ICD-10-CM

## 2021-10-12 DIAGNOSIS — N179 Acute kidney failure, unspecified: Secondary | ICD-10-CM | POA: Diagnosis not present

## 2021-10-12 DIAGNOSIS — I5033 Acute on chronic diastolic (congestive) heart failure: Secondary | ICD-10-CM | POA: Diagnosis not present

## 2021-10-12 DIAGNOSIS — J9600 Acute respiratory failure, unspecified whether with hypoxia or hypercapnia: Secondary | ICD-10-CM | POA: Diagnosis not present

## 2021-10-12 DIAGNOSIS — R0609 Other forms of dyspnea: Secondary | ICD-10-CM

## 2021-10-12 DIAGNOSIS — L89152 Pressure ulcer of sacral region, stage 2: Secondary | ICD-10-CM | POA: Diagnosis present

## 2021-10-12 LAB — COMPREHENSIVE METABOLIC PANEL
ALT: 25 U/L (ref 0–44)
AST: 16 U/L (ref 15–41)
Albumin: 3.2 g/dL — ABNORMAL LOW (ref 3.5–5.0)
Alkaline Phosphatase: 40 U/L (ref 38–126)
Anion gap: 11 (ref 5–15)
BUN: 83 mg/dL — ABNORMAL HIGH (ref 8–23)
CO2: 23 mmol/L (ref 22–32)
Calcium: 8.9 mg/dL (ref 8.9–10.3)
Chloride: 104 mmol/L (ref 98–111)
Creatinine, Ser: 2.8 mg/dL — ABNORMAL HIGH (ref 0.44–1.00)
GFR, Estimated: 16 mL/min — ABNORMAL LOW (ref >60)
Glucose, Bld: 133 mg/dL — ABNORMAL HIGH (ref 70–99)
Potassium: 3.8 mmol/L (ref 3.5–5.1)
Sodium: 138 mmol/L (ref 135–145)
Total Bilirubin: 0.7 mg/dL (ref 0.3–1.2)
Total Protein: 5.8 g/dL — ABNORMAL LOW (ref 6.5–8.1)

## 2021-10-12 LAB — CBC
HCT: 31.6 % — ABNORMAL LOW (ref 36.0–46.0)
Hemoglobin: 10 g/dL — ABNORMAL LOW (ref 12.0–15.0)
MCH: 29.5 pg (ref 26.0–34.0)
MCHC: 31.6 g/dL (ref 30.0–36.0)
MCV: 93.2 fL (ref 80.0–100.0)
Platelets: 194 10*3/uL (ref 150–400)
RBC: 3.39 MIL/uL — ABNORMAL LOW (ref 3.87–5.11)
RDW: 15.1 % (ref 11.5–15.5)
WBC: 8.8 10*3/uL (ref 4.0–10.5)
nRBC: 0 % (ref 0.0–0.2)

## 2021-10-12 LAB — LIPID PANEL
Cholesterol: 141 mg/dL (ref 0–200)
HDL: 45 mg/dL (ref >40)
LDL Cholesterol: 84 mg/dL (ref 0–99)
Total CHOL/HDL Ratio: 3.1 RATIO
Triglycerides: 61 mg/dL (ref <150)
VLDL: 12 mg/dL (ref 0–40)

## 2021-10-12 LAB — APTT: aPTT: 69 seconds — ABNORMAL HIGH (ref 24–36)

## 2021-10-12 LAB — PHOSPHORUS: Phosphorus: 5.9 mg/dL — ABNORMAL HIGH (ref 2.5–4.6)

## 2021-10-12 LAB — LACTIC ACID, PLASMA: Lactic Acid, Venous: 1.3 mmol/L (ref 0.5–1.9)

## 2021-10-12 LAB — HEPARIN LEVEL (UNFRACTIONATED): Heparin Unfractionated: >1.1 IU/mL — ABNORMAL HIGH (ref 0.30–0.70)

## 2021-10-12 LAB — TROPONIN I (HIGH SENSITIVITY): Troponin I (High Sensitivity): 5247 ng/L (ref <18)

## 2021-10-12 LAB — MAGNESIUM: Magnesium: 2.3 mg/dL (ref 1.7–2.4)

## 2021-10-12 MED ORDER — LIDOCAINE HCL 1 % IJ SOLN
INTRAMUSCULAR | Status: AC
  Administered 2021-10-12: 10 mL
  Filled 2021-10-12: qty 20

## 2021-10-12 MED ORDER — ATORVASTATIN CALCIUM 10 MG PO TABS
20.0000 mg | ORAL_TABLET | Freq: Every day | ORAL | Status: DC
  Administered 2021-10-12 – 2021-10-25 (×14): 20 mg via ORAL
  Filled 2021-10-12 (×15): qty 2

## 2021-10-12 NOTE — H&P (View-Only) (Signed)
Progress Note  Patient Name: Jillian Cooley Date of Encounter: 10/12/2021  [facility] Cardiologist: Werner Lean, MD   Subjective   SOB in the afternoon yesterday, back to baseline for the evening.  Inpatient Medications    Scheduled Meds:  aspirin EC  81 mg Oral Daily   Chlorhexidine Gluconate Cloth  6 each Topical Daily   famotidine  20 mg Oral Daily   furosemide  40 mg Intravenous BID   hydrocortisone sod succinate (SOLU-CORTEF) inj  50 mg Intravenous Q12H   mouth rinse  15 mL Mouth Rinse BID   Continuous Infusions:  sodium chloride Stopped (10/10/21 1902)   sodium chloride Stopped (10/10/21 2039)   heparin 750 Units/hr (10/12/21 0339)   PRN Meds: sodium chloride, docusate sodium, polyethylene glycol   Vital Signs    Vitals:   10/12/21 0330 10/12/21 0400 10/12/21 0500 10/12/21 0735  BP:  (!) 145/82 136/89   Pulse:  94 91 91  Resp:  20 17 (!) 24  Temp: 98 F (36.7 C)     TempSrc: Axillary     SpO2:  99% 100% 98%  Weight:   55.6 kg   Height:        Intake/Output Summary (Last 24 hours) at 10/12/2021 0740 Last data filed at 10/12/2021 0339 Gross per 24 hour  Intake 454.26 ml  Output 400 ml  Net 54.26 ml   Last 3 Weights 10/12/2021 10/11/2021 10/10/2021  Weight (lbs) 122 lb 9.2 oz 123 lb 10.9 oz 118 lb 13.3 oz  Weight (kg) 55.6 kg 56.1 kg 53.9 kg      Telemetry    SR - Personally Reviewed  ECG    N/a - Personally Reviewed  Physical Exam   GEN: No acute distress.   Neck: No JVD Cardiac: RRR, 2/6 systolic murmur apex Respiratory: decreased breath sounds bilateral bases GI: Soft, nontender, non-distended  MS: No edema; No deformity. Neuro:  Nonfocal  Psych: Normal affect   Labs    High Sensitivity Troponin:   Recent Labs  Lab 10/10/21 0731 10/10/21 0935 10/10/21 1548 10/10/21 1822 10/12/21 0252  TROPONINIHS 523* 836* 3,307* 5,378* 5,247*     Chemistry Recent Labs  Lab 10/10/21 0731 10/10/21 1548  10/11/21 0333 10/12/21 0252  NA 136 138 138 138  K 4.6 4.2 4.1 3.8  CL 103 107 104 104  CO2 19* 18* 24 23  GLUCOSE 322* 105* 152* 133*  BUN 74* 73* 79* 83*  CREATININE 2.43* 1.93* 2.53* 2.80*  CALCIUM 9.6 9.0 8.9 8.9  MG  --  2.3 2.2 2.3  PROT 6.9 6.0*  --  5.8*  ALBUMIN 3.8 3.4*  --  3.2*  AST 33 32  --  16  ALT 34 30  --  25  ALKPHOS 56 45  --  40  BILITOT 0.7 0.8  --  0.7  GFRNONAA 19* 25* 18* 16*  ANIONGAP $RemoveB'14 13 10 11    'DyXSLYRZ$ Lipids  Recent Labs  Lab 10/12/21 0252  CHOL 141  TRIG 61  HDL 45  LDLCALC 84  CHOLHDL 3.1    Hematology Recent Labs  Lab 10/10/21 1548 10/11/21 0333 10/12/21 0252  WBC 10.9* 9.9 8.8  RBC 3.62* 3.53* 3.39*  HGB 10.6* 10.4* 10.0*  HCT 34.2* 32.7* 31.6*  MCV 94.5 92.6 93.2  MCH 29.3 29.5 29.5  MCHC 31.0 31.8 31.6  RDW 14.8 15.0 15.1  PLT 218 213 194   Thyroid No results for input(s): TSH, FREET4 in the last  168 hours.  BNP Recent Labs  Lab 10/10/21 0731  BNP 1,070.9*    DDimer No results for input(s): DDIMER in the last 168 hours.   Radiology    DG Chest 1 View  Result Date: 10/11/2021 CLINICAL DATA:  Dyspnea and wheezing. EXAM: CHEST  1 VIEW COMPARISON:  Radiograph earlier today, additional priors reviewed. FINDINGS: Bilateral pleural effusions, left greater than right, at least moderate in size. No significant change from earlier today. Associated bibasilar opacities. Interstitial edema with slight improvement. Heart is likely enlarged, but obscured by pleural effusions on the current exam. No pneumothorax. IMPRESSION: 1. Unchanged bilateral pleural effusions, left greater than right, at least moderate in size. 2. Interstitial edema with slight improvement from earlier today. Electronically Signed   By: Keith Rake M.D.   On: 10/11/2021 19:15   DG Chest 1 View  Result Date: 10/10/2021 CLINICAL DATA:  Post thoracentesis EXAM: CHEST  1 VIEW COMPARISON:  Earlier same day FINDINGS: Decreased left pleural effusion with improved  left lung aeration. Right pleural effusion is also decreased with improved right basilar aeration. Stable interstitial prominence. No pneumothorax. Normal heart size for technique. IMPRESSION: Significantly decreased left pleural effusion with improved lung aeration. No pneumothorax. Right pleural effusion has also decreased with improved right basilar aeration. Electronically Signed   By: Macy Mis M.D.   On: 10/10/2021 14:51   DG Chest Port 1 View  Result Date: 10/11/2021 CLINICAL DATA:  Follow-up pleural effusions. EXAM: PORTABLE CHEST 1 VIEW COMPARISON:  10/10/2021 FINDINGS: Stable cardiomediastinal contours. Bilateral pleural effusions are again noted left greater than right. Compared with the previous exam there is been interval increase in left pleural effusion. There is mild interstitial edema. Progressive pulmonary opacities are identified within both mid and lower lung zones, left greater than right. IMPRESSION: 1. Bilateral pleural effusions, left greater than right. 2. Mild interstitial edema with progressive bilateral pulmonary opacities, left greater than right. Electronically Signed   By: Kerby Moors M.D.   On: 10/11/2021 06:35   ECHOCARDIOGRAM COMPLETE  Result Date: 10/10/2021    ECHOCARDIOGRAM REPORT   Patient Name:   Jillian Cooley Date of Exam: 10/10/2021 Medical Rec #:  [id number]             Height:       63.0 in Accession #:    [id number]            Weight:       125.1 lb Date of Birth:  July 09, 1932             BSA:          1.584 m Patient Age:    87 years              BP:           87/66 mmHg Patient Gender: F                     HR:           81 bpm. 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 Risk Factors:Hypertension and Dyslipidemia.  Sonographer:    Bernadene Person  RDCS Referring Phys: [place]  1. Normal LV  function; mild AI; severe MR; elevated mean gradient across MV of 5 mmHg likely at least partially related to severe MR; moderate TR.  2. Left ventricular ejection fraction, by estimation, is 60 to 65%. The left ventricle has normal function. The left ventricle has no regional wall motion abnormalities. Left ventricular diastolic parameters are indeterminate.  3. Right ventricular systolic function is normal. The right ventricular size is normal. There is moderately elevated pulmonary artery systolic pressure.  4. Left atrial size was moderately dilated.  5. Large pleural effusion in the left lateral region.  6. The mitral valve is abnormal. Severe mitral valve regurgitation. Mild mitral stenosis.  7. Tricuspid valve regurgitation is moderate.  8. The aortic valve has an indeterminant number of cusps. Aortic valve regurgitation is mild. No aortic stenosis is present.  9. The inferior vena cava is normal in size with <50% respiratory variability, suggesting right atrial pressure of 8 mmHg. FINDINGS  Left Ventricle: Left ventricular ejection fraction, by estimation, is 60 to 65%. The left ventricle has normal function. The left ventricle has no regional wall motion abnormalities. The left ventricular internal cavity size was normal in size. There is  no left ventricular hypertrophy. Left ventricular diastolic parameters are indeterminate. Right Ventricle: The right ventricular size is normal. Right ventricular systolic function is normal. There is moderately elevated pulmonary artery systolic pressure. The tricuspid regurgitant velocity is 3.40 m/s, and with an assumed right atrial pressure of 8 mmHg, the estimated right ventricular systolic pressure is 03.2 mmHg. Left Atrium: Left atrial size was moderately dilated. Right Atrium: Right atrial size was normal in size. Pericardium: Trivial pericardial effusion is present. Mitral Valve: The mitral  valve is abnormal. There is mild thickening of the mitral valve leaflet(s). Severe mitral valve regurgitation. Mild mitral valve stenosis. Tricuspid Valve: The tricuspid valve is normal in structure. Tricuspid valve regurgitation is moderate . No evidence of tricuspid stenosis. Aortic Valve: The aortic valve has an indeterminant number of cusps. Aortic valve regurgitation is mild. Aortic regurgitation PHT measures 242 msec. No aortic stenosis is present. Pulmonic Valve: The pulmonic valve was grossly normal. Pulmonic valve regurgitation is not visualized. No evidence of pulmonic stenosis. Aorta: The aortic root is normal in size and structure. Venous: The inferior vena cava is normal in size with less than 50% respiratory variability, suggesting right atrial pressure of 8 mmHg. IAS/Shunts: No atrial level shunt detected by color flow Doppler. Additional Comments: Normal LV function; mild AI; severe MR; elevated mean gradient across MV of 5 mmHg likely at least partially related to severe MR; moderate TR. There is a large pleural effusion in the left lateral region.  LEFT VENTRICLE PLAX 2D LVIDd:         3.60 cm LVIDs:         2.70 cm LV PW:         0.90 cm LV IVS:        0.90 cm LVOT diam:     1.50 cm LV SV:         25 LV SV Index:   16 LVOT Area:     1.77 cm  LV Volumes (MOD) LV vol d, MOD A2C: 49.8 ml LV vol d, MOD A4C: 65.4 ml LV vol s, MOD A2C: 26.8 ml LV vol s, MOD A4C: 42.0 ml LV SV MOD A2C:     23.0 ml LV SV MOD A4C:     65.4 ml LV SV MOD BP:      22.0 ml RIGHT  VENTRICLE TAPSE (M-mode): 1.1 cm LEFT ATRIUM             Index        RIGHT ATRIUM           Index LA diam:        2.70 cm 1.70 cm/m   RA Area:     14.50 cm LA Vol (A2C):   47.4 ml 29.92 ml/m  RA Volume:   35.80 ml  22.60 ml/m LA Vol (A4C):   44.6 ml 28.16 ml/m LA Biplane Vol: 50.9 ml 32.13 ml/m  AORTIC VALVE LVOT Vmax:   65.60 cm/s LVOT Vmean:  43.400 cm/s LVOT VTI:    0.140 m AI PHT:      242 msec  AORTA Ao Root diam: 3.00 cm Ao Asc diam:   3.00 cm MR Peak grad:    63.8 mmHg    TRICUSPID VALVE MR Mean grad:    44.0 mmHg    TR Peak grad:   46.2 mmHg MR Vmax:         399.50 cm/s  TR Vmax:        340.00 cm/s MR Vmean:        314.0 cm/s MR PISA:         2.26 cm     SHUNTS MR PISA Eff ROA: 22 mm       Systemic VTI:  0.14 m MR PISA Radius:  0.60 cm      Systemic Diam: 1.50 cm Kirk Ruths MD Electronically signed by Kirk Ruths MD Signature Date/Time: 10/10/2021/2:04:01 PM    Final    VAS [place] LOWER EXTREMITY VENOUS (DVT)  Result Date: 10/11/2021  Lower Venous DVT Study Patient Name:  Jillian Cooley  Date of Exam:   10/10/2021 Medical Rec #: [id number]              Accession #:    [id number] Date of Birth: Mar 29, 1932              Patient Gender: F Patient Age:   63 years Exam Location:  [facility] Procedure:      VAS [place] LOWER EXTREMITY VENOUS (DVT) Referring Phys: MURALI RAMASWAMY --------------------------------------------------------------------------------  Indications: Edema, and history of DVT.  Comparison Study: 04/25/2021 bilateral lower extremity venous duplex- acute DVT                   right soleal veins and left PTV. Performing Technologist: Maudry Mayhew MHA, RDMS, RVT, RDCS  Examination Guidelines: A complete evaluation includes B-mode imaging, spectral Doppler, color Doppler, and power Doppler as needed of all accessible portions of each vessel. Bilateral testing is considered an integral part of a complete examination. Limited examinations for reoccurring indications may be performed as noted. The reflux portion of the exam is performed with the patient in reverse Trendelenburg.  +---------+---------------+---------+-----------+----------+--------------+ RIGHT    CompressibilityPhasicitySpontaneityPropertiesThrombus Aging +---------+---------------+---------+-----------+----------+--------------+ CFV      Full           No       Yes                                  +---------+---------------+---------+-----------+----------+--------------+ SFJ      Full                                                        +---------+---------------+---------+-----------+----------+--------------+  FV Prox  Full                                                        +---------+---------------+---------+-----------+----------+--------------+ FV Mid   Full                                                        +---------+---------------+---------+-----------+----------+--------------+ FV DistalFull                                                        +---------+---------------+---------+-----------+----------+--------------+ PFV      Full                                                        +---------+---------------+---------+-----------+----------+--------------+ POP      Full           No       Yes                                 +---------+---------------+---------+-----------+----------+--------------+ PTV      Full                                                        +---------+---------------+---------+-----------+----------+--------------+ PERO     Full                                                        +---------+---------------+---------+-----------+----------+--------------+   Right Technical Findings: Not visualized segments include limited visualization PTV and peroneal veins.  +---------+---------------+---------+-----------+----------+--------------+ LEFT     CompressibilityPhasicitySpontaneityPropertiesThrombus Aging +---------+---------------+---------+-----------+----------+--------------+ CFV      Full           No       Yes                                 +---------+---------------+---------+-----------+----------+--------------+ SFJ      Full                                                        +---------+---------------+---------+-----------+----------+--------------+ FV Prox  Full                                                         +---------+---------------+---------+-----------+----------+--------------+  FV Mid   Full                                                        +---------+---------------+---------+-----------+----------+--------------+ FV DistalFull                                                        +---------+---------------+---------+-----------+----------+--------------+ PFV      Full                                                        +---------+---------------+---------+-----------+----------+--------------+ POP      Full           No       Yes                                 +---------+---------------+---------+-----------+----------+--------------+   Left Technical Findings: Not visualized segments include PTV, peroneal veins.   Summary: RIGHT: - There is no evidence of deep vein thrombosis in the lower extremity. However, portions of this examination were limited- see technologist comments above.  - No cystic structure found in the popliteal fossa.  LEFT: - There is no evidence of deep vein thrombosis in the lower extremity. However, portions of this examination were limited- see technologist comments above.  - No cystic structure found in the popliteal fossa.  Bilateral lower extremity veins exhibit pulsatile flow, suggestive of possibly elevated right heart pressure.  *See table(s) above for measurements and observations. Electronically signed by Deitra Mayo MD on 10/11/2021 at 5:14:55 AM.    Final    US THORACENTESIS ASP PLEURAL SPACE W/IMG GUIDE  Result Date: 10/10/2021 INDICATION: Shortness of breath with hypoxia. Large left pleural effusion. Request for diagnostic and therapeutic thoracentesis, some maximum of 1.5 L. EXAM: ULTRASOUND GUIDED LEFT THORACENTESIS MEDICATIONS: 1% plain lidocaine, 5 mL COMPLICATIONS: None immediate. PROCEDURE: An ultrasound guided thoracentesis was thoroughly discussed with the patient  and questions answered. The benefits, risks, alternatives and complications were also discussed. The patient understands and wishes to proceed with the procedure. Written consent was obtained. Ultrasound was performed to localize and mark an adequate pocket of fluid in the left chest. The area was then prepped and draped in the normal sterile fashion. 1% Lidocaine was used for local anesthesia. Under ultrasound guidance a 6 Fr Safe-T-Centesis catheter was introduced. Thoracentesis was performed. The catheter was removed and a dressing applied. FINDINGS: A total of approximately 1.5 L of clear yellow fluid was removed. Samples were sent to the laboratory as requested by the clinical team. IMPRESSION: Successful ultrasound guided left thoracentesis yielding 1.5 L of pleural fluid. Read by: Ascencion Dike PA-C Electronically Signed   By: Sandi Mariscal M.D.   On: 10/10/2021 14:49    Cardiac Studies    Patient Profile     Carrigan Delafuente is a 87 y.o. female with a hx of hypertension, hyperlipidemia, valvular heart disease (aortic regurgitation, mitral regurgitation and tricuspid  regurgitation, chronic kidney disease stage III, carpal tunnel syndrome, pulmonary embolism, multiple myeloma, and pleural effusion s/p thoracentesis who is being seen 10/10/2021 for the evaluation of elevated troponin at the request of Dr. Almyra Free.  Assessment & Plan  1.Elevated troponin - trop peak up to 5300, EKG without specific ischemic changes - 09/2021 echo LVEF 60-65%, no WMAs, indet diastolic function, normal RV, mod elevated PASP, mod severe MR, mild AI, mod TR - she is on heparin already for history of prior PE with her home eliquis on hold.    - likely large component of demand ishcemia in the setting of hypoxia and hypotension. Given degree of trop elevation likely some underlying CAD, her renal function and multiple myeomloma limit cath consideration - continue hep gtt, while off eliquis start ASA 81mg  daily. When  back on eliquis can d/c aspirin. Start atorvastatin 20mg  daily.  BP's would prohibit beta blocker or ACE/ARB.      2.Valvular heart disease -04/2021 echo mod MR possibly underestimated, mild to mod AI, mild TR - 09/2021 echo mod to sevre MR, mild AI - could consider TEE as outpatient in the future depending on clinical course of her comorbidities, unclear candidacy for potential intervention.      3. Pleural effusion -issues with recurrent pleural effusions, multiple prior thoracentesis - thoracentesis this admission with 1.5 L removed, significant improvement in SOB - CXR bilateral pleural effusions L>R - fluid has been consistent with transudate.     4. Multiple myeloma - was due to start chemotherpay 10/10/2021   5. AKI   on CKD - baseline Cr 1.7-1.8 - Cr up to 2.8, likely acute injury from hypotension.    6. History of prior PE  - has been on home eliquis - eliquis on hold during admission, on hep gtt.    7.Hypotension - hypotensive on admission. Lactic acid 4.3, procalcitonin <0.10 on admission. Lactic acidosis has resolved.  - primarily responded to IVFs, phenylephrien written for but appears did not have to be started.  - echo shows normal LVEF, RV limited visualization but function looks fairly normal. Her MR is chronic and would not be etiology of her hypotension. She is on stress dose steroids currenty, had been on steroids at home.  - bp's have normalized.    8. Chronic resp failure - at home on 2L [place] - back to her baseline O2 requirement 2L [place]  9. Chronic diastolic HF/Pulm HTN - 07/1858 echo PASP 72, grade II dd - admit echo PASP around 50 - BNP was 1070  - received IV lasix 40mg  followed by 20 mg yesterday. I/Os are incomplete. Uptrend in Cr. Due for IV lasix 40mg  bid to day, would dose just once given rise in Cr.  - critical care team has suggested possible RHC given recurrent pleural effusions, severe pulmonary HTN on prior echo, ongoing SOB/DOE, elevation in  Cr with attempts at diuresis despite recurring transudative pleural effusions - continue IV lasix, will look to schedule RHC tomorrow.   10. Lactic acidosis    Shared Decision Making/Informed Consent The risks [stroke (1 in 1000), death (1 in 1000), kidney failure [usually temporary] (1 in 500), bleeding (1 in 200), allergic reaction [possibly serious] (1 in 200)], benefits (diagnostic support and management of coronary artery disease) and alternatives of a cardiac catheterization were discussed in detail with Ms. Trovato and she is willing to proceed.      For questions or updates, please contact [place] Please consult [email or web address] for contact info under  Merrily Pew, MD  10/12/2021, 7:40 AM

## 2021-10-12 NOTE — Procedures (Signed)
PROCEDURE SUMMARY:  Successful US guided left thoracentesis. Yielded 1.2 L of red tinged fluid. Patient tolerated procedure well. No immediate complications. EBL = trace  Post procedure chest X-ray pending.  Murrell Redden PA-C 10/12/2021 12:37 PM

## 2021-10-12 NOTE — Progress Notes (Addendum)
Progress Note  Patient Name: Oma Marzan Date of Encounter: 10/12/2021  [facility] Cardiologist: Werner Lean, MD   Subjective   SOB in the afternoon yesterday, back to baseline for the evening.  Inpatient Medications    Scheduled Meds:  aspirin EC  81 mg Oral Daily   Chlorhexidine Gluconate Cloth  6 each Topical Daily   famotidine  20 mg Oral Daily   furosemide  40 mg Intravenous BID   hydrocortisone sod succinate (SOLU-CORTEF) inj  50 mg Intravenous Q12H   mouth rinse  15 mL Mouth Rinse BID   Continuous Infusions:  sodium chloride Stopped (10/10/21 1902)   sodium chloride Stopped (10/10/21 2039)   heparin 750 Units/hr (10/12/21 0339)   PRN Meds: sodium chloride, docusate sodium, polyethylene glycol   Vital Signs    Vitals:   10/12/21 0330 10/12/21 0400 10/12/21 0500 10/12/21 0735  BP:  (!) 145/82 136/89   Pulse:  94 91 91  Resp:  20 17 (!) 24  Temp: 98 F (36.7 C)     TempSrc: Axillary     SpO2:  99% 100% 98%  Weight:   55.6 kg   Height:        Intake/Output Summary (Last 24 hours) at 10/12/2021 0740 Last data filed at 10/12/2021 0339 Gross per 24 hour  Intake 454.26 ml  Output 400 ml  Net 54.26 ml   Last 3 Weights 10/12/2021 10/11/2021 10/10/2021  Weight (lbs) 122 lb 9.2 oz 123 lb 10.9 oz 118 lb 13.3 oz  Weight (kg) 55.6 kg 56.1 kg 53.9 kg      Telemetry    SR - Personally Reviewed  ECG    N/a - Personally Reviewed  Physical Exam   GEN: No acute distress.   Neck: No JVD Cardiac: RRR, 2/6 systolic murmur apex Respiratory: decreased breath sounds bilateral bases GI: Soft, nontender, non-distended  MS: No edema; No deformity. Neuro:  Nonfocal  Psych: Normal affect   Labs    High Sensitivity Troponin:   Recent Labs  Lab 10/10/21 0731 10/10/21 0935 10/10/21 1548 10/10/21 1822 10/12/21 0252  TROPONINIHS 523* 836* 3,307* 5,378* 5,247*     Chemistry Recent Labs  Lab 10/10/21 0731 10/10/21 1548  10/11/21 0333 10/12/21 0252  NA 136 138 138 138  K 4.6 4.2 4.1 3.8  CL 103 107 104 104  CO2 19* 18* 24 23  GLUCOSE 322* 105* 152* 133*  BUN 74* 73* 79* 83*  CREATININE 2.43* 1.93* 2.53* 2.80*  CALCIUM 9.6 9.0 8.9 8.9  MG  --  2.3 2.2 2.3  PROT 6.9 6.0*  --  5.8*  ALBUMIN 3.8 3.4*  --  3.2*  AST 33 32  --  16  ALT 34 30  --  25  ALKPHOS 56 45  --  40  BILITOT 0.7 0.8  --  0.7  GFRNONAA 19* 25* 18* 16*  ANIONGAP $RemoveB'14 13 10 11    'rgzLlfnd$ Lipids  Recent Labs  Lab 10/12/21 0252  CHOL 141  TRIG 61  HDL 45  LDLCALC 84  CHOLHDL 3.1    Hematology Recent Labs  Lab 10/10/21 1548 10/11/21 0333 10/12/21 0252  WBC 10.9* 9.9 8.8  RBC 3.62* 3.53* 3.39*  HGB 10.6* 10.4* 10.0*  HCT 34.2* 32.7* 31.6*  MCV 94.5 92.6 93.2  MCH 29.3 29.5 29.5  MCHC 31.0 31.8 31.6  RDW 14.8 15.0 15.1  PLT 218 213 194   Thyroid No results for input(s): TSH, FREET4 in the last  168 hours.  BNP Recent Labs  Lab 10/10/21 0731  BNP 1,070.9*    DDimer No results for input(s): DDIMER in the last 168 hours.   Radiology    DG Chest 1 View  Result Date: 10/11/2021 CLINICAL DATA:  Dyspnea and wheezing. EXAM: CHEST  1 VIEW COMPARISON:  Radiograph earlier today, additional priors reviewed. FINDINGS: Bilateral pleural effusions, left greater than right, at least moderate in size. No significant change from earlier today. Associated bibasilar opacities. Interstitial edema with slight improvement. Heart is likely enlarged, but obscured by pleural effusions on the current exam. No pneumothorax. IMPRESSION: 1. Unchanged bilateral pleural effusions, left greater than right, at least moderate in size. 2. Interstitial edema with slight improvement from earlier today. Electronically Signed   By: Keith Rake M.D.   On: 10/11/2021 19:15   DG Chest 1 View  Result Date: 10/10/2021 CLINICAL DATA:  Post thoracentesis EXAM: CHEST  1 VIEW COMPARISON:  Earlier same day FINDINGS: Decreased left pleural effusion with improved  left lung aeration. Right pleural effusion is also decreased with improved right basilar aeration. Stable interstitial prominence. No pneumothorax. Normal heart size for technique. IMPRESSION: Significantly decreased left pleural effusion with improved lung aeration. No pneumothorax. Right pleural effusion has also decreased with improved right basilar aeration. Electronically Signed   By: Macy Mis M.D.   On: 10/10/2021 14:51   DG Chest Port 1 View  Result Date: 10/11/2021 CLINICAL DATA:  Follow-up pleural effusions. EXAM: PORTABLE CHEST 1 VIEW COMPARISON:  10/10/2021 FINDINGS: Stable cardiomediastinal contours. Bilateral pleural effusions are again noted left greater than right. Compared with the previous exam there is been interval increase in left pleural effusion. There is mild interstitial edema. Progressive pulmonary opacities are identified within both mid and lower lung zones, left greater than right. IMPRESSION: 1. Bilateral pleural effusions, left greater than right. 2. Mild interstitial edema with progressive bilateral pulmonary opacities, left greater than right. Electronically Signed   By: Kerby Moors M.D.   On: 10/11/2021 06:35   ECHOCARDIOGRAM COMPLETE  Result Date: 10/10/2021    ECHOCARDIOGRAM REPORT   Patient Name:   NAIRA STANDIFORD Date of Exam: 10/10/2021 Medical Rec #:  [id number]             Height:       63.0 in Accession #:    [id number]            Weight:       125.1 lb Date of Birth:  1932-03-31             BSA:          1.584 m Patient Age:    88 years              BP:           87/66 mmHg Patient Gender: F                     HR:           81 bpm. 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 Risk Factors:Hypertension and Dyslipidemia.  Sonographer:    Bernadene Person  RDCS Referring Phys: [place]  1. Normal LV  function; mild AI; severe MR; elevated mean gradient across MV of 5 mmHg likely at least partially related to severe MR; moderate TR.  2. Left ventricular ejection fraction, by estimation, is 60 to 65%. The left ventricle has normal function. The left ventricle has no regional wall motion abnormalities. Left ventricular diastolic parameters are indeterminate.  3. Right ventricular systolic function is normal. The right ventricular size is normal. There is moderately elevated pulmonary artery systolic pressure.  4. Left atrial size was moderately dilated.  5. Large pleural effusion in the left lateral region.  6. The mitral valve is abnormal. Severe mitral valve regurgitation. Mild mitral stenosis.  7. Tricuspid valve regurgitation is moderate.  8. The aortic valve has an indeterminant number of cusps. Aortic valve regurgitation is mild. No aortic stenosis is present.  9. The inferior vena cava is normal in size with <50% respiratory variability, suggesting right atrial pressure of 8 mmHg. FINDINGS  Left Ventricle: Left ventricular ejection fraction, by estimation, is 60 to 65%. The left ventricle has normal function. The left ventricle has no regional wall motion abnormalities. The left ventricular internal cavity size was normal in size. There is  no left ventricular hypertrophy. Left ventricular diastolic parameters are indeterminate. Right Ventricle: The right ventricular size is normal. Right ventricular systolic function is normal. There is moderately elevated pulmonary artery systolic pressure. The tricuspid regurgitant velocity is 3.40 m/s, and with an assumed right atrial pressure of 8 mmHg, the estimated right ventricular systolic pressure is 51.7 mmHg. Left Atrium: Left atrial size was moderately dilated. Right Atrium: Right atrial size was normal in size. Pericardium: Trivial pericardial effusion is present. Mitral Valve: The mitral  valve is abnormal. There is mild thickening of the mitral valve leaflet(s). Severe mitral valve regurgitation. Mild mitral valve stenosis. Tricuspid Valve: The tricuspid valve is normal in structure. Tricuspid valve regurgitation is moderate . No evidence of tricuspid stenosis. Aortic Valve: The aortic valve has an indeterminant number of cusps. Aortic valve regurgitation is mild. Aortic regurgitation PHT measures 242 msec. No aortic stenosis is present. Pulmonic Valve: The pulmonic valve was grossly normal. Pulmonic valve regurgitation is not visualized. No evidence of pulmonic stenosis. Aorta: The aortic root is normal in size and structure. Venous: The inferior vena cava is normal in size with less than 50% respiratory variability, suggesting right atrial pressure of 8 mmHg. IAS/Shunts: No atrial level shunt detected by color flow Doppler. Additional Comments: Normal LV function; mild AI; severe MR; elevated mean gradient across MV of 5 mmHg likely at least partially related to severe MR; moderate TR. There is a large pleural effusion in the left lateral region.  LEFT VENTRICLE PLAX 2D LVIDd:         3.60 cm LVIDs:         2.70 cm LV PW:         0.90 cm LV IVS:        0.90 cm LVOT diam:     1.50 cm LV SV:         25 LV SV Index:   16 LVOT Area:     1.77 cm  LV Volumes (MOD) LV vol d, MOD A2C: 49.8 ml LV vol d, MOD A4C: 65.4 ml LV vol s, MOD A2C: 26.8 ml LV vol s, MOD A4C: 42.0 ml LV SV MOD A2C:     23.0 ml LV SV MOD A4C:     65.4 ml LV SV MOD BP:      22.0 ml RIGHT  VENTRICLE TAPSE (M-mode): 1.1 cm LEFT ATRIUM             Index        RIGHT ATRIUM           Index LA diam:        2.70 cm 1.70 cm/m   RA Area:     14.50 cm LA Vol (A2C):   47.4 ml 29.92 ml/m  RA Volume:   35.80 ml  22.60 ml/m LA Vol (A4C):   44.6 ml 28.16 ml/m LA Biplane Vol: 50.9 ml 32.13 ml/m  AORTIC VALVE LVOT Vmax:   65.60 cm/s LVOT Vmean:  43.400 cm/s LVOT VTI:    0.140 m AI PHT:      242 msec  AORTA Ao Root diam: 3.00 cm Ao Asc diam:   3.00 cm MR Peak grad:    63.8 mmHg    TRICUSPID VALVE MR Mean grad:    44.0 mmHg    TR Peak grad:   46.2 mmHg MR Vmax:         399.50 cm/s  TR Vmax:        340.00 cm/s MR Vmean:        314.0 cm/s MR PISA:         2.26 cm     SHUNTS MR PISA Eff ROA: 22 mm       Systemic VTI:  0.14 m MR PISA Radius:  0.60 cm      Systemic Diam: 1.50 cm Kirk Ruths MD Electronically signed by Kirk Ruths MD Signature Date/Time: 10/10/2021/2:04:01 PM    Final    VAS [place] LOWER EXTREMITY VENOUS (DVT)  Result Date: 10/11/2021  Lower Venous DVT Study Patient Name:  MAYDA SHIPPEE  Date of Exam:   10/10/2021 Medical Rec #: [id number]              Accession #:    [id number] Date of Birth: [birth year]              Patient Gender: F Patient Age:   71 years Exam Location:  [facility] Procedure:      VAS [place] LOWER EXTREMITY VENOUS (DVT) Referring Phys: MURALI RAMASWAMY --------------------------------------------------------------------------------  Indications: Edema, and history of DVT.  Comparison Study: 04/25/2021 bilateral lower extremity venous duplex- acute DVT                   right soleal veins and left PTV. Performing Technologist: Maudry Mayhew MHA, RDMS, RVT, RDCS  Examination Guidelines: A complete evaluation includes B-mode imaging, spectral Doppler, color Doppler, and power Doppler as needed of all accessible portions of each vessel. Bilateral testing is considered an integral part of a complete examination. Limited examinations for reoccurring indications may be performed as noted. The reflux portion of the exam is performed with the patient in reverse Trendelenburg.  +---------+---------------+---------+-----------+----------+--------------+ RIGHT    CompressibilityPhasicitySpontaneityPropertiesThrombus Aging +---------+---------------+---------+-----------+----------+--------------+ CFV      Full           No       Yes                                  +---------+---------------+---------+-----------+----------+--------------+ SFJ      Full                                                        +---------+---------------+---------+-----------+----------+--------------+  FV Prox  Full                                                        +---------+---------------+---------+-----------+----------+--------------+ FV Mid   Full                                                        +---------+---------------+---------+-----------+----------+--------------+ FV DistalFull                                                        +---------+---------------+---------+-----------+----------+--------------+ PFV      Full                                                        +---------+---------------+---------+-----------+----------+--------------+ POP      Full           No       Yes                                 +---------+---------------+---------+-----------+----------+--------------+ PTV      Full                                                        +---------+---------------+---------+-----------+----------+--------------+ PERO     Full                                                        +---------+---------------+---------+-----------+----------+--------------+   Right Technical Findings: Not visualized segments include limited visualization PTV and peroneal veins.  +---------+---------------+---------+-----------+----------+--------------+ LEFT     CompressibilityPhasicitySpontaneityPropertiesThrombus Aging +---------+---------------+---------+-----------+----------+--------------+ CFV      Full           No       Yes                                 +---------+---------------+---------+-----------+----------+--------------+ SFJ      Full                                                        +---------+---------------+---------+-----------+----------+--------------+ FV Prox  Full                                                         +---------+---------------+---------+-----------+----------+--------------+  FV Mid   Full                                                        +---------+---------------+---------+-----------+----------+--------------+ FV DistalFull                                                        +---------+---------------+---------+-----------+----------+--------------+ PFV      Full                                                        +---------+---------------+---------+-----------+----------+--------------+ POP      Full           No       Yes                                 +---------+---------------+---------+-----------+----------+--------------+   Left Technical Findings: Not visualized segments include PTV, peroneal veins.   Summary: RIGHT: - There is no evidence of deep vein thrombosis in the lower extremity. However, portions of this examination were limited- see technologist comments above.  - No cystic structure found in the popliteal fossa.  LEFT: - There is no evidence of deep vein thrombosis in the lower extremity. However, portions of this examination were limited- see technologist comments above.  - No cystic structure found in the popliteal fossa.  Bilateral lower extremity veins exhibit pulsatile flow, suggestive of possibly elevated right heart pressure.  *See table(s) above for measurements and observations. Electronically signed by Deitra Mayo MD on 10/11/2021 at 5:14:55 AM.    Final    US THORACENTESIS ASP PLEURAL SPACE W/IMG GUIDE  Result Date: 10/10/2021 INDICATION: Shortness of breath with hypoxia. Large left pleural effusion. Request for diagnostic and therapeutic thoracentesis, some maximum of 1.5 L. EXAM: ULTRASOUND GUIDED LEFT THORACENTESIS MEDICATIONS: 1% plain lidocaine, 5 mL COMPLICATIONS: None immediate. PROCEDURE: An ultrasound guided thoracentesis was thoroughly discussed with the patient  and questions answered. The benefits, risks, alternatives and complications were also discussed. The patient understands and wishes to proceed with the procedure. Written consent was obtained. Ultrasound was performed to localize and mark an adequate pocket of fluid in the left chest. The area was then prepped and draped in the normal sterile fashion. 1% Lidocaine was used for local anesthesia. Under ultrasound guidance a 6 Fr Safe-T-Centesis catheter was introduced. Thoracentesis was performed. The catheter was removed and a dressing applied. FINDINGS: A total of approximately 1.5 L of clear yellow fluid was removed. Samples were sent to the laboratory as requested by the clinical team. IMPRESSION: Successful ultrasound guided left thoracentesis yielding 1.5 L of pleural fluid. Read by: Ascencion Dike PA-C Electronically Signed   By: Sandi Mariscal M.D.   On: 10/10/2021 14:49    Cardiac Studies    Patient Profile     Texie Tupou is a 87 y.o. female with a hx of hypertension, hyperlipidemia, valvular heart disease (aortic regurgitation, mitral regurgitation and tricuspid  regurgitation, chronic kidney disease stage III, carpal tunnel syndrome, pulmonary embolism, multiple myeloma, and pleural effusion s/p thoracentesis who is being seen 10/10/2021 for the evaluation of elevated troponin at the request of Dr. Almyra Free.  Assessment & Plan  1.Elevated troponin - trop peak up to 5300, EKG without specific ischemic changes - 09/2021 echo LVEF 60-65%, no WMAs, indet diastolic function, normal RV, mod elevated PASP, mod severe MR, mild AI, mod TR - she is on heparin already for history of prior PE with her home eliquis on hold.    - likely large component of demand ishcemia in the setting of hypoxia and hypotension. Given degree of trop elevation likely some underlying CAD, her renal function and multiple myeomloma limit cath consideration - continue hep gtt, while off eliquis start ASA 81mg  daily. When  back on eliquis can d/c aspirin. Start atorvastatin 20mg  daily.  BP's would prohibit beta blocker or ACE/ARB.      2.Valvular heart disease -04/2021 echo mod MR possibly underestimated, mild to mod AI, mild TR - 09/2021 echo mod to sevre MR, mild AI - could consider TEE as outpatient in the future depending on clinical course of her comorbidities, unclear candidacy for potential intervention.      3. Pleural effusion -issues with recurrent pleural effusions, multiple prior thoracentesis - thoracentesis this admission with 1.5 L removed, significant improvement in SOB - CXR bilateral pleural effusions L>R - fluid has been consistent with transudate.     4. Multiple myeloma - was due to start chemotherpay 10/10/2021   5. AKI   on CKD - baseline Cr 1.7-1.8 - Cr up to 2.8, likely acute injury from hypotension.    6. History of prior PE  - has been on home eliquis - eliquis on hold during admission, on hep gtt.    7.Hypotension - hypotensive on admission. Lactic acid 4.3, procalcitonin <0.10 on admission. Lactic acidosis has resolved.  - primarily responded to IVFs, phenylephrien written for but appears did not have to be started.  - echo shows normal LVEF, RV limited visualization but function looks fairly normal. Her MR is chronic and would not be etiology of her hypotension. She is on stress dose steroids currenty, had been on steroids at home.  - bp's have normalized.    8. Chronic resp failure - at home on 2L [place] - back to her baseline O2 requirement 2L [place]  9. Chronic diastolic HF/Pulm HTN - 03/6567 echo PASP 72, grade II dd - admit echo PASP around 50 - BNP was 1070  - received IV lasix 40mg  followed by 20 mg yesterday. I/Os are incomplete. Uptrend in Cr. Due for IV lasix 40mg  bid to day, would dose just once given rise in Cr.  - critical care team has suggested possible RHC given recurrent pleural effusions, severe pulmonary HTN on prior echo, ongoing SOB/DOE, elevation in  Cr with attempts at diuresis despite recurring transudative pleural effusions - continue IV lasix, will look to schedule RHC tomorrow.   10. Lactic acidosis    Shared Decision Making/Informed Consent The risks [stroke (1 in 1000), death (1 in 1000), kidney failure [usually temporary] (1 in 500), bleeding (1 in 200), allergic reaction [possibly serious] (1 in 200)], benefits (diagnostic support and management of coronary artery disease) and alternatives of a cardiac catheterization were discussed in detail with Ms. Saindon and she is willing to proceed.      For questions or updates, please contact [place] Please consult [email or web address] for contact info under  Merrily Pew, MD  10/12/2021, 7:40 AM

## 2021-10-12 NOTE — Progress Notes (Signed)
PROGRESS NOTE   Jillian Cooley  QIH:[id number]    DOB: 16-Jul-1932    DOA: 10/10/2021  PCP: [name], MD   I have briefly reviewed patients previous medical records in [facility].  Chief Complaint  Patient presents with   Shortness of Breath    Brief Narrative:  87 year old female, lives with spouse, independent, on as needed home oxygen, medical history significant for hospitalization in May 2022 for right rotator cuff repair complicated by acute hypoxia and found to have bilateral DVT, PE and left pleural effusion, started on Eliquis, underwent left thoracentesis of 800 mL, recurrent left pleural effusion in September 2022 and again underwent thoracentesis with transudative effusion, now readmitted with acute respiratory failure with hypoxia requiring BiPAP in ED.  Admitted to ICU by CCM for acute on chronic hypoxic respiratory failure due to large recurrent left pleural effusion, hypotension, acute on chronic diastolic CHF, severe MR, pulmonary hypertension and NSTEMI.  S/p 1.5 L left thoracentesis.  Remains on IV heparin drip.  Cardiology consulting.  Possible right heart cath 10/24.  Medical history also significant for stage IV chronic kidney disease, HTN, HLD, mitral regurgitation.   Assessment & Plan:  Active Problems:   Pleural effusion, left   Acute respiratory failure (HCC)   Pressure injury of skin   Dyspnea   Acute on chronic hypoxic respiratory failure - On as needed home oxygen.  Required BiPAP in ED.  Currently weaned down to 2 L/min [place] O2. - Multifactorial due to large left pleural effusion, recurrent and acute on chronic diastolic CHF. - S/p 1.5 L thoracentesis 10/21 by IR.  Transudative effusion.  Pulmonology following. - S/p IV Lasix but creatinine gradually rising - Continue IV heparin.  On Eliquis at home for history of DVT/PE. - Lower extremity venous Dopplers negative for DVT. - Discussed with Dr. Chase Caller 10/23 regarding repeating left  thoracentesis  Acute on chronic left pleural effusion, recurrent - Has required repeated therapeutic thoracentesis. - Reportedly has been transudative as per pulmonology. - May need to address primary etiology.  Right heart catheterization may help. - Diuretic management is limited due to worsening CKD. - Pulmonology working up further with renal ultrasound, RUQ liver ultrasound and 24-hour protein collection.  Acute on chronic diastolic CHF/severe MR/severe pulmonary artery hypertension. - Chest x-ray suggestive of pulmonary edema.  Trace ankle edema. - S/p IV Lasix 20 mg yesterday and creatinine has increased. - Cardiology following.  NSTEMI type II - Cardiology following and feel that this is due to demand ischemia. - Troponin peaked to 5300. - 2D echo: LVEF 60 to 65%, no wall motion abnormalities.  Moderate to severe MR, moderate TR. - Remains on IV heparin instead of home Eliquis. - Not a left heart cath candidate which may push her into ESRD and does not appear to be good long-term HD candidate. - Continue aspirin (while on heparin and off Eliquis, can stop when back on Eliquis) and statins.  History of DVT and PE May 2022 - Now on IV heparin instead of home Eliquis due to need for interventions and NSTEMI  Essential hypertension - Blood pressures controlled off of antihypertensives. - Off antihypertensives on admission due to hypotension.  Hyperlipidemia - Continue statins.  Hypotension on admission - Responded to IV fluid bolus. - Etiology?  Cardiac versus septic versus relative adrenal insufficiency. - Home med list showed Decadron 20 mg once a week on admission - Per pulmonology, no evidence of sepsis.  Sepsis ruled out.  Stress dose hydrocortisone  being weaned off.  Preadmission acyclovir?  Indication. -Holding acyclovir until renal functions improved.  Acute kidney injury complicating stage IV chronic kidney disease. - Recent baseline creatinine 2.3. - Follows  with Dr. Joylene Grapes at [facility] - Creatinine has increased to 2.8 after IV Lasix.  Lasix on hold. - Awaiting RHC 10/24 to determine volume status and further management. - Pulmonology working up with renal ultrasound, labs (ANCA and GBM)  Hyperphosphatemia - Monitor.  Anemia - Mild hemoglobin drop consistent with critical illness.  Stable in the 10 g range. - Follow and transfuse if hemoglobin 7 g or less.  Multiple myeloma - Follows with [name], oncology. - Was supposed to start chemotherapy on 10/10/2021.  Unable to start at this time due to hospitalization and acute illness. - We will alert oncologist of patient's admission.  Mild protein calorie malnutrition - Agree with nutrition consultation.  Adult failure to thrive - Multifactorial due to very advanced age, frail physical health and multiple severe significant comorbidities.  Lactic acidosis - Resolved.   Body mass index is 21.71 kg/m.  Nutritional Status        Pressure Ulcer:     DVT prophylaxis:   Currently on IV heparin drip.   Code Status: Full Code Family Communication: Left VM message for the daughter to call back if needed for any updates.  It appears that the family have already discussed with Dr. Chase Caller at bedside. Disposition:  Status is: Inpatient  Remains inpatient appropriate because: Very ill patient with complex multiple medical problems as noted above, remains on IV heparin drip with plans for right heart cath in a.m.        Consultants:   PCCM Cardiology  Procedures:   None  Antimicrobials:    Anti-infectives (From admission, onward)    Start     Dose/Rate Route Frequency Ordered Stop   10/10/21 1900  cefTRIAXone (ROCEPHIN) 2 g in sodium chloride 0.9 % 100 mL IVPB  Status:  Discontinued        2 g 200 mL/hr over 30 Minutes Intravenous Every 24 hours 10/10/21 1457 10/11/21 1039   10/10/21 1900  azithromycin (ZITHROMAX) 500 mg in sodium chloride 0.9 % 250 mL IVPB  Status:   Discontinued        500 mg 250 mL/hr over 60 Minutes Intravenous Every 24 hours 10/10/21 1457 10/11/21 1015   10/10/21 1045  piperacillin-tazobactam (ZOSYN) IVPB 3.375 g        3.375 g 100 mL/hr over 30 Minutes Intravenous  Once 10/10/21 1034 10/10/21 1255   10/10/21 1030  piperacillin-tazobactam (ZOSYN) IVPB 2.25 g  Status:  Discontinued        2.25 g 100 mL/hr over 30 Minutes Intravenous  Once 10/10/21 1017 10/10/21 1034         Subjective:  Overnight events noted.  Said that she had dyspnea for a couple of hours around 6 PM last night.  Improved after Lasix.  Breathing however not at baseline.  No chest pain.  Objective:   Vitals:   10/12/21 0735 10/12/21 0800 10/12/21 0832 10/12/21 0900  BP:  (!) 142/72  (!) 145/67  Pulse: 91 91  93  Resp: (!) 24 (!) 23  (!) 21  Temp:   (!) 97.5 F (36.4 C)   TempSrc:   Oral   SpO2: 98% 99%  100%  Weight:      Height:        General exam: Elderly female, small built, frail, lying comfortably propped up  in bed without distress. Respiratory system: Reduced breath sounds in both bases with bronchial breath sounds just above.  Rest of lung fields clear to auscultation without wheezing or rhonchi.  Noted dyspnea even when speaking and especially with minimal exertion such as sitting up in bed. Cardiovascular system: S1 & S2 heard, RRR. No murmurs, rubs, gallops or clicks.  Trace ankle edema.  JVD +.  Telemetry personally reviewed: Sinus rhythm. Gastrointestinal system: Abdomen is nondistended, soft and nontender. No organomegaly or masses felt. Normal bowel sounds heard. Central nervous system: Alert and oriented. No focal neurological deficits. Extremities: Symmetric 5 x 5 power.   Skin: No rashes, lesions or ulcers Psychiatry: Judgement and insight appear normal. Mood & affect appropriate.     Data Reviewed:   I have personally reviewed following labs and imaging studies   CBC: Recent Labs  Lab 10/10/21 0731 10/10/21 1548  10/11/21 0333 10/12/21 0252  WBC 10.1 10.9* 9.9 8.8  NEUTROABS 8.4* 9.5*  --   --   HGB 11.5* 10.6* 10.4* 10.0*  HCT 37.4 34.2* 32.7* 31.6*  MCV 96.1 94.5 92.6 93.2  PLT 311 218 213 883    Basic Metabolic Panel: Recent Labs  Lab 10/10/21 1548 10/11/21 0333 10/12/21 0252  NA 138 138 138  K 4.2 4.1 3.8  CL 107 104 104  CO2 18* 24 23  GLUCOSE 105* 152* 133*  BUN 73* 79* 83*  CREATININE 1.93* 2.53* 2.80*  CALCIUM 9.0 8.9 8.9  MG 2.3 2.2 2.3  PHOS 5.8* 6.1* 5.9*    Liver Function Tests: Recent Labs  Lab 10/10/21 0731 10/10/21 1548 10/12/21 0252  AST 33 32 16  ALT 34 30 25  ALKPHOS 56 45 40  BILITOT 0.7 0.8 0.7  PROT 6.9 6.0* 5.8*  ALBUMIN 3.8 3.4* 3.2*    CBG: No results for input(s): GLUCAP in the last 168 hours.  Microbiology Studies:   Recent Results (from the past 240 hour(s))  Resp Panel by RT-PCR (Flu A&B, Covid) Nasopharyngeal Swab     Status: None   Collection Time: 10/10/21  8:57 AM   Specimen: Nasopharyngeal Swab; Nasopharyngeal(NP) swabs in vial transport medium  Result Value Ref Range Status   SARS Coronavirus 2 by RT PCR NEGATIVE NEGATIVE Final    Comment: (NOTE) SARS-CoV-2 target nucleic acids are NOT DETECTED.  The SARS-CoV-2 RNA is generally detectable in upper respiratory specimens during the acute phase of infection. The lowest concentration of SARS-CoV-2 viral copies this assay can detect is 138 copies/mL. A negative result does not preclude SARS-Cov-2 infection and should not be used as the sole basis for treatment or other patient management decisions. A negative result may occur with  improper specimen collection/handling, submission of specimen other than nasopharyngeal swab, presence of viral mutation(s) within the areas targeted by this assay, and inadequate number of viral copies(<138 copies/mL). A negative result must be combined with clinical observations, patient history, and epidemiological information. The expected result  is Negative.  Fact Sheet for Patients:  EntrepreneurPulse.com.au  Fact Sheet for Healthcare Providers:  IncredibleEmployment.be  This test is no t yet approved or cleared by the [place] FDA and  has been authorized for detection and/or diagnosis of SARS-CoV-2 by FDA under an Emergency Use Authorization (EUA). This EUA will remain  in effect (meaning this test can be used) for the duration of the COVID-19 declaration under Section 564(b)(1) of the Act, 21 U.S.C.section 360bbb-3(b)(1), unless the authorization is terminated  or revoked sooner.  Influenza A by PCR NEGATIVE NEGATIVE Final   Influenza B by PCR NEGATIVE NEGATIVE Final    Comment: (NOTE) The Xpert Xpress SARS-CoV-2/FLU/RSV plus assay is intended as an aid in the diagnosis of influenza from Nasopharyngeal swab specimens and should not be used as a sole basis for treatment. Nasal washings and aspirates are unacceptable for Xpert Xpress SARS-CoV-2/FLU/RSV testing.  Fact Sheet for Patients: BloggerCourse.com  Fact Sheet for Healthcare Providers: SeriousBroker.it  This test is not yet approved or cleared by the [place] FDA and has been authorized for detection and/or diagnosis of SARS-CoV-2 by FDA under an Emergency Use Authorization (EUA). This EUA will remain in effect (meaning this test can be used) for the duration of the COVID-19 declaration under Section 564(b)(1) of the Act, 21 U.S.C. section 360bbb-3(b)(1), unless the authorization is terminated or revoked.  Performed at [facility], [address] [place], [place] [address]   Culture, blood (routine x 2)     Status: None (Preliminary result)   Collection Time: 10/10/21 10:08 AM   Specimen: BLOOD  Result Value Ref Range Status   Specimen Description   Final    BLOOD RIGHT ANTECUBITAL Performed at [facility], [address], [place], [place] [address]    Special Requests   Final    BOTTLES DRAWN AEROBIC AND ANAEROBIC Blood Culture results may not be optimal due to an inadequate volume of blood received in culture bottles Performed at [place] [facility], [address], [place], [place] [address]    Culture   Final    NO GROWTH 1 DAY Performed at [facility], [address] [place], [place] [address]    Report Status PENDING  Incomplete  Body fluid culture w Gram Stain     Status: None (Preliminary result)   Collection Time: 10/10/21  2:21 PM   Specimen: PATH Cytology Pleural fluid  Result Value Ref Range Status   Specimen Description   Final    PLEURAL Performed at [facility], [address], [place], [place] [address]    Special Requests   Final    NONE Performed at [facility], [address], [place], [place] [address]    Gram Stain   Final    NO SQUAMOUS EPITHELIAL CELLS SEEN FEW WBC SEEN NO ORGANISMS SEEN    Culture   Final    NO GROWTH 1 DAY Performed at [facility], [address], [place], [place] [address]    Report Status PENDING  Incomplete  MRSA Next Gen by PCR, Nasal     Status: None   Collection Time: 10/10/21  5:34 PM   Specimen: Nasal Mucosa; Nasal Swab  Result Value Ref Range Status   MRSA by PCR Next Gen NOT DETECTED NOT DETECTED Final    Comment: (NOTE) The GeneXpert MRSA Assay (FDA approved for NASAL specimens only), is one component of a comprehensive MRSA colonization surveillance program. It is not intended to diagnose MRSA infection nor to guide or monitor treatment for MRSA infections. Test performance is not FDA approved in patients less than 73 years old. Performed at [facility], [address], [place], [place] [address]   Culture, blood (routine x 2)     Status: None (Preliminary result)   Collection Time: 10/10/21  6:22 PM   Specimen: BLOOD LEFT FOREARM   Result Value Ref Range Status   Specimen Description   Final  BLOOD LEFT FOREARM Performed at [facility] [address] [place], [place] [address]    Special Requests   Final    BOTTLES DRAWN AEROBIC ONLY Blood Culture adequate volume Performed at [facility] [address], [place], [place] [address]    Culture   Final    NO GROWTH < 12 HOURS Performed at [facility] [address] [place], [place] [address]    Report Status PENDING  Incomplete  Respiratory (~20 pathogens) panel by PCR     Status: None   Collection Time: 10/11/21 10:29 AM   Specimen: Nasopharyngeal Swab; Respiratory  Result Value Ref Range Status   Adenovirus NOT DETECTED NOT DETECTED Final   Coronavirus 229E NOT DETECTED NOT DETECTED Final    Comment: (NOTE) The Coronavirus on the Respiratory Panel, DOES NOT test for the novel  Coronavirus (2019 nCoV)    Coronavirus HKU1 NOT DETECTED NOT DETECTED Final   Coronavirus NL63 NOT DETECTED NOT DETECTED Final   Coronavirus OC43 NOT DETECTED NOT DETECTED Final   Metapneumovirus NOT DETECTED NOT DETECTED Final   Rhinovirus / Enterovirus NOT DETECTED NOT DETECTED Final   Influenza A NOT DETECTED NOT DETECTED Final   Influenza B NOT DETECTED NOT DETECTED Final   Parainfluenza Virus 1 NOT DETECTED NOT DETECTED Final   Parainfluenza Virus 2 NOT DETECTED NOT DETECTED Final   Parainfluenza Virus 3 NOT DETECTED NOT DETECTED Final   Parainfluenza Virus 4 NOT DETECTED NOT DETECTED Final   Respiratory Syncytial Virus NOT DETECTED NOT DETECTED Final   Bordetella pertussis NOT DETECTED NOT DETECTED Final   Bordetella Parapertussis NOT DETECTED NOT DETECTED Final   Chlamydophila pneumoniae NOT DETECTED NOT DETECTED Final   Mycoplasma pneumoniae NOT DETECTED NOT DETECTED Final    Comment: Performed at [facility], [place], [place], [place] [address]     Radiology Studies:  DG Chest 1 View  Result Date:  10/11/2021 CLINICAL DATA:  Dyspnea and wheezing. EXAM: CHEST  1 VIEW COMPARISON:  Radiograph earlier today, additional priors reviewed. FINDINGS: Bilateral pleural effusions, left greater than right, at least moderate in size. No significant change from earlier today. Associated bibasilar opacities. Interstitial edema with slight improvement. Heart is likely enlarged, but obscured by pleural effusions on the current exam. No pneumothorax. IMPRESSION: 1. Unchanged bilateral pleural effusions, left greater than right, at least moderate in size. 2. Interstitial edema with slight improvement from earlier today. Electronically Signed   By: Keith Rake M.D.   On: 10/11/2021 19:15   DG Chest 1 View  Result Date: 10/10/2021 CLINICAL DATA:  Post thoracentesis EXAM: CHEST  1 VIEW COMPARISON:  Earlier same day FINDINGS: Decreased left pleural effusion with improved left lung aeration. Right pleural effusion is also decreased with improved right basilar aeration. Stable interstitial prominence. No pneumothorax. Normal heart size for technique. IMPRESSION: Significantly decreased left pleural effusion with improved lung aeration. No pneumothorax. Right pleural effusion has also decreased with improved right basilar aeration. Electronically Signed   By: Macy Mis M.D.   On: 10/10/2021 14:51   DG Chest Port 1 View  Result Date: 10/11/2021 CLINICAL DATA:  Follow-up pleural effusions. EXAM: PORTABLE CHEST 1 VIEW COMPARISON:  10/10/2021 FINDINGS: Stable cardiomediastinal contours. Bilateral pleural effusions are again noted left greater than right. Compared with the previous exam there is been interval increase in left pleural effusion. There is mild interstitial edema. Progressive pulmonary opacities are identified within both mid and lower lung zones, left  greater than right. IMPRESSION: 1. Bilateral pleural effusions, left greater than right. 2. Mild interstitial edema with progressive bilateral pulmonary  opacities, left greater than right. Electronically Signed   By: Kerby Moors M.D.   On: 10/11/2021 06:35   ECHOCARDIOGRAM COMPLETE  Result Date: 10/10/2021    ECHOCARDIOGRAM REPORT   Patient Name:   MYLEE FALIN Date of Exam: 10/10/2021 Medical Rec #:  [id number]             Height:       63.0 in Accession #:    [id number]            Weight:       125.1 lb Date of Birth:  Sep 25, 1932             BSA:          1.584 m Patient Age:    87 years              BP:           87/66 mmHg Patient Gender: F                     HR:           81 bpm. 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 Risk Factors:Hypertension and Dyslipidemia.  Sonographer:    Bernadene Person RDCS Referring Phys: Gordon  1. Normal LV function; mild AI; severe MR; elevated mean gradient across MV of 5 mmHg likely at least partially related to severe MR; moderate TR.  2. Left ventricular ejection fraction, by estimation, is 60 to 65%. The left ventricle has normal function. The left ventricle has no regional wall motion abnormalities. Left ventricular diastolic parameters are indeterminate.  3. Right ventricular systolic function is normal. The right ventricular size is normal. There is moderately elevated pulmonary artery systolic pressure.  4. Left atrial size was moderately dilated.  5. Large pleural effusion in the left lateral region.  6. The mitral valve is abnormal. Severe mitral valve regurgitation. Mild mitral stenosis.  7. Tricuspid valve regurgitation is moderate.  8. The aortic valve has an indeterminant number of cusps. Aortic valve regurgitation is mild. No aortic stenosis is present.  9. The inferior vena cava is normal in size with <50% respiratory variability, suggesting right atrial pressure of 8  mmHg. FINDINGS  Left Ventricle: Left ventricular ejection fraction, by estimation, is 60 to 65%. The left ventricle has normal function. The left ventricle has no regional wall motion abnormalities. The left ventricular internal cavity size was normal in size. There is  no left ventricular hypertrophy. Left ventricular diastolic parameters are indeterminate. Right Ventricle: The right ventricular size is normal. Right ventricular systolic function is normal. There is moderately elevated pulmonary artery systolic pressure. The tricuspid regurgitant velocity is 3.40 m/s, and with an assumed right atrial pressure of 8 mmHg, the estimated right ventricular systolic pressure is 25.3 mmHg. Left Atrium: Left atrial size was moderately dilated. Right Atrium: Right atrial size was normal in size. Pericardium: Trivial pericardial effusion is present. Mitral Valve: The mitral valve is abnormal. There is mild thickening of the mitral valve leaflet(s). Severe mitral valve regurgitation. Mild mitral  valve stenosis. Tricuspid Valve: The tricuspid valve is normal in structure. Tricuspid valve regurgitation is moderate . No evidence of tricuspid stenosis. Aortic Valve: The aortic valve has an indeterminant number of cusps. Aortic valve regurgitation is mild. Aortic regurgitation PHT measures 242 msec. No aortic stenosis is present. Pulmonic Valve: The pulmonic valve was grossly normal. Pulmonic valve regurgitation is not visualized. No evidence of pulmonic stenosis. Aorta: The aortic root is normal in size and structure. Venous: The inferior vena cava is normal in size with less than 50% respiratory variability, suggesting right atrial pressure of 8 mmHg. IAS/Shunts: No atrial level shunt detected by color flow Doppler. Additional Comments: Normal LV function; mild AI; severe MR; elevated mean gradient across MV of 5 mmHg likely at least partially related to severe MR; moderate TR. There is a large pleural effusion in the left  lateral region.  LEFT VENTRICLE PLAX 2D LVIDd:         3.60 cm LVIDs:         2.70 cm LV PW:         0.90 cm LV IVS:        0.90 cm LVOT diam:     1.50 cm LV SV:         25 LV SV Index:   16 LVOT Area:     1.77 cm  LV Volumes (MOD) LV vol d, MOD A2C: 49.8 ml LV vol d, MOD A4C: 65.4 ml LV vol s, MOD A2C: 26.8 ml LV vol s, MOD A4C: 42.0 ml LV SV MOD A2C:     23.0 ml LV SV MOD A4C:     65.4 ml LV SV MOD BP:      22.0 ml RIGHT VENTRICLE TAPSE (M-mode): 1.1 cm LEFT ATRIUM             Index        RIGHT ATRIUM           Index LA diam:        2.70 cm 1.70 cm/m   RA Area:     14.50 cm LA Vol (A2C):   47.4 ml 29.92 ml/m  RA Volume:   35.80 ml  22.60 ml/m LA Vol (A4C):   44.6 ml 28.16 ml/m LA Biplane Vol: 50.9 ml 32.13 ml/m  AORTIC VALVE LVOT Vmax:   65.60 cm/s LVOT Vmean:  43.400 cm/s LVOT VTI:    0.140 m AI PHT:      242 msec  AORTA Ao Root diam: 3.00 cm Ao Asc diam:  3.00 cm MR Peak grad:    63.8 mmHg    TRICUSPID VALVE MR Mean grad:    44.0 mmHg    TR Peak grad:   46.2 mmHg MR Vmax:         399.50 cm/s  TR Vmax:        340.00 cm/s MR Vmean:        314.0 cm/s MR PISA:         2.26 cm     SHUNTS MR PISA Eff ROA: 22 mm       Systemic VTI:  0.14 m MR PISA Radius:  0.60 cm      Systemic Diam: 1.50 cm Kirk Ruths MD Electronically signed by Kirk Ruths MD Signature Date/Time: 10/10/2021/2:04:01 PM    Final    VAS [place] LOWER EXTREMITY VENOUS (DVT)  Result Date: 10/11/2021  Lower Venous DVT Study Patient Name:  EBONI COVAL  Date of Exam:   10/10/2021  Medical Rec #: [id number]              Accession #:    [id number] Date of Birth: 1932/09/20              Patient Gender: F Patient Age:   18 years Exam Location:  [facility] Procedure:      VAS [place] LOWER EXTREMITY VENOUS (DVT) Referring Phys: MURALI RAMASWAMY --------------------------------------------------------------------------------  Indications: Edema, and history of DVT.  Comparison Study: 04/25/2021 bilateral lower extremity venous duplex-  acute DVT                   right soleal veins and left PTV. Performing Technologist: Maudry Mayhew MHA, RDMS, RVT, RDCS  Examination Guidelines: A complete evaluation includes B-mode imaging, spectral Doppler, color Doppler, and power Doppler as needed of all accessible portions of each vessel. Bilateral testing is considered an integral part of a complete examination. Limited examinations for reoccurring indications may be performed as noted. The reflux portion of the exam is performed with the patient in reverse Trendelenburg.  +---------+---------------+---------+-----------+----------+--------------+ RIGHT    CompressibilityPhasicitySpontaneityPropertiesThrombus Aging +---------+---------------+---------+-----------+----------+--------------+ CFV      Full           No       Yes                                 +---------+---------------+---------+-----------+----------+--------------+ SFJ      Full                                                        +---------+---------------+---------+-----------+----------+--------------+ FV Prox  Full                                                        +---------+---------------+---------+-----------+----------+--------------+ FV Mid   Full                                                        +---------+---------------+---------+-----------+----------+--------------+ FV DistalFull                                                        +---------+---------------+---------+-----------+----------+--------------+ PFV      Full                                                        +---------+---------------+---------+-----------+----------+--------------+ POP      Full           No       Yes                                 +---------+---------------+---------+-----------+----------+--------------+  PTV      Full                                                         +---------+---------------+---------+-----------+----------+--------------+ PERO     Full                                                        +---------+---------------+---------+-----------+----------+--------------+   Right Technical Findings: Not visualized segments include limited visualization PTV and peroneal veins.  +---------+---------------+---------+-----------+----------+--------------+ LEFT     CompressibilityPhasicitySpontaneityPropertiesThrombus Aging +---------+---------------+---------+-----------+----------+--------------+ CFV      Full           No       Yes                                 +---------+---------------+---------+-----------+----------+--------------+ SFJ      Full                                                        +---------+---------------+---------+-----------+----------+--------------+ FV Prox  Full                                                        +---------+---------------+---------+-----------+----------+--------------+ FV Mid   Full                                                        +---------+---------------+---------+-----------+----------+--------------+ FV DistalFull                                                        +---------+---------------+---------+-----------+----------+--------------+ PFV      Full                                                        +---------+---------------+---------+-----------+----------+--------------+ POP      Full           No       Yes                                 +---------+---------------+---------+-----------+----------+--------------+   Left Technical Findings: Not visualized segments include PTV, peroneal veins.   Summary: RIGHT: - There is no evidence of deep vein thrombosis in the lower  extremity. However, portions of this examination were limited- see technologist comments above.  - No cystic structure found in the popliteal fossa.  LEFT: - There  is no evidence of deep vein thrombosis in the lower extremity. However, portions of this examination were limited- see technologist comments above.  - No cystic structure found in the popliteal fossa.  Bilateral lower extremity veins exhibit pulsatile flow, suggestive of possibly elevated right heart pressure.  *See table(s) above for measurements and observations. Electronically signed by Deitra Mayo MD on 10/11/2021 at 5:14:55 AM.    Final    US THORACENTESIS ASP PLEURAL SPACE W/IMG GUIDE  Result Date: 10/10/2021 INDICATION: Shortness of breath with hypoxia. Large left pleural effusion. Request for diagnostic and therapeutic thoracentesis, some maximum of 1.5 L. EXAM: ULTRASOUND GUIDED LEFT THORACENTESIS MEDICATIONS: 1% plain lidocaine, 5 mL COMPLICATIONS: None immediate. PROCEDURE: An ultrasound guided thoracentesis was thoroughly discussed with the patient and questions answered. The benefits, risks, alternatives and complications were also discussed. The patient understands and wishes to proceed with the procedure. Written consent was obtained. Ultrasound was performed to localize and mark an adequate pocket of fluid in the left chest. The area was then prepped and draped in the normal sterile fashion. 1% Lidocaine was used for local anesthesia. Under ultrasound guidance a 6 Fr Safe-T-Centesis catheter was introduced. Thoracentesis was performed. The catheter was removed and a dressing applied. FINDINGS: A total of approximately 1.5 L of clear yellow fluid was removed. Samples were sent to the laboratory as requested by the clinical team. IMPRESSION: Successful ultrasound guided left thoracentesis yielding 1.5 L of pleural fluid. Read by: Ascencion Dike PA-C Electronically Signed   By: Sandi Mariscal M.D.   On: 10/10/2021 14:49     Scheduled Meds:    aspirin EC  81 mg Oral Daily   atorvastatin  20 mg Oral Daily   Chlorhexidine Gluconate Cloth  6 each Topical Daily   famotidine  20 mg Oral Daily    hydrocortisone sod succinate (SOLU-CORTEF) inj  50 mg Intravenous Q12H   mouth rinse  15 mL Mouth Rinse BID    Continuous Infusions:    sodium chloride Stopped (10/10/21 1902)   sodium chloride Stopped (10/10/21 2039)   heparin 750 Units/hr (10/12/21 0727)     LOS: 2 days     Vernell Leep, MD, FACP, [facility]. [facility]    To contact the attending provider between 7A-7P or the covering provider during after hours 7P-7A, please log into the web site [email or web address] and access using universal [place] password for that web site. If you do not have the password, please call the hospital operator.  10/12/2021, 10:43 AM

## 2021-10-12 NOTE — Progress Notes (Signed)
NAME:  Jillian Cooley, MRN:  [id number], DOB:  1932/08/22, LOS: 2 ADMISSION DATE:  10/10/2021, CONSULTATION DATE:  10/10/2021  REFERRING MD:  Dr Dorann Lodge [facility], CHIEF COMPLAINT:  resp failure, large left effusion and metabolic acidosis   BRIEF  Background hx Hematological/Oncological History #Bilateral DVT and PE: 1) 04/23/2021-04/26/2021: Admitted for right rotator cuff repair and found to have acute hypoxia.  Chest xray found small left pleural effusion with associated atelectasis and trace right pleural effusion and right basilar airspace disease. Underwent left thoracentesis with removal of 800 cc. Due to persistent hypoxia, patient underwent NM pulmonary perfusion study that confirmed bilateral pulmonary emboli. Doppler US revealed bilateral acute DVT involving the right soleal veins and left posterior tibial veins. Patient was started on Eliquis.  2)  06/17/2021: Establish care with Dede Query PA-C and recommend Eliquis x 6 months.    #Normocytic Anemia: 1) 06/17/2021: Labs show anemia with Hgb 10.4, MCV 90.1 2) 06/27/2021: Labs show iron deficiency with serum iron 33, iron saturation 13%; vitamin B12 deficiency with vitamin B12 at 266 and elevated MMA at 410. Folate level normal. Recommended oral ferrous sulfate 325 mg once daily and vitamin b12 1000 mcg once daily.    #Multiple Myeloma:significant for Kappa light chain multiple myeloma  1) 09/04/2021: Labs from [facility] -TIBC 228 (L), Iron 64, Iron saturation 28%, Ferritin 216 (H), Creatinine 1.96 (H), BUN 51 (H), Calcium 10.0, -CBC: Hgb 11.2,  -SPEP revealed M-Spike 0.3 with immunofixation that showed monoclonal free kappa light chain.  -sFLC: Free Kappa Lt Chain: 4504.5 (H), Free Lambda Lt Chain: 19.7, Kappa/Lambda Ratio: 228.65 (H) 09/24/2021: bone marrow biopsy showed hypercellular bone marrow for age with plasma cell neoplasm with plasma cells representing 25% of for cells in the aspirate    September 30,  202 seen by by pulmonary critical care service: -?  Outpatient admitted for worsening dyspnea and found to have significant left pleural effusion [given back in January 2022 she had left greater than right pleural effusion].  Echocardiogram from May 2022 showed grade 2 diastolic dysfunction with significant elevation in pulmonary artery pressures.  Mild pericardial effusion +.  And mild to moderate arctic regurgitation.  Thoracentesis showed transudate.  Cytology nondiagnostic for malignant cells [prior thoracentesis in February 2022 with similar results]   09/30/21 oncology clinic  continues to have issues with shortness of breath and low energy.  She was prescribed oxygen which she does occasionally use at home.  She notes that she has not been having any issues with fevers, chills, sweats, nausea, vomiting or diarrhea.  Her health is otherwise been stable  10/10/2021 - ADMIT HPI ER -scheduled for chemotherapy today but present with worsening hypoxemia.  Husband found her pulse ox in the 80s despite her 4 L nasal cannula.  In the ER started on BiPAP.  Blood pressures been intermittently soft.  Performed metabolic acidosis with a pH of 7.1 and a lactic acid of 4.  Blood pressure soft but responded to fluid bolus.  Echocardiogram being done in the ER with provisional technician reporting "ejection fraction down].  Chest x-ray with large left-sided pleural effusion.  Also having acute on chronic kidney injury [baseline creatinine 1.84 mg percent] current creatinine 2.14 mg percent.  She has been started on BiPAP.  Also troponin increased from 500-800 with BNP of 1000.  IV heparin has been started by the ER.  Baseline albumin appears to be 3.5-3.4 suggesting mild protein calorie malnutrition.  Also some amount of failure to  thrive   She has a living will that asked for no resuscitation of persistent vegetative state incurable and terminal illness but in talking to her CODE STATUS currently is full code  although she is reflecting on her decisions about intubation and CPR.  Pertinent  Medical History    has a past medical history of Abnormal EKG, Carpal tunnel syndrome, CKD (chronic kidney disease), stage III ([place]), DDD (degenerative disc disease), lumbar, Hypertension, Hypertension with renal disease, Menopause, Mixed hyperlipidemia, Muscular degeneration, Nonrheumatic aortic (valve) insufficiency, Nonrheumatic mitral valve regurgitation, Osteoarthritis, Pedal edema, Pleural effusion, Skin cancer, and Vitamin D deficiency.   has a past surgical history that includes Anterior cervical decomp/discectomy fusion (2009); Lumbar laminectomy/decompression microdiscectomy (06/13/2012); Carpal tunnel release (Bilateral); Shoulder arthroscopy with rotator cuff repair and subacromial decompression (Right, 04/23/2021); IR THORACENTESIS ASP PLEURAL SPACE W/IMG GUIDE (04/25/2021); and Thoracentesis (N/A, 09/10/2021).    Significant Hospital Events: Including procedures, antibiotic start and stop dates in addition to other pertinent events   10/10/2021 - admit. - seen in [place] ER 12 - On BiPAP. Soft BP, lactic acidosis 4 with pH 7.1, s/p 1.5 L thoracentesis transudate with LDH 64.  [Cytology pending].  Stress dose hydrocortisone and fluids for soft blood pressure.  IV heparin for non-STEMI. 10/22 - seen by cardiology.  Demand ischemia suspected troponin peaked at 5000.  Currently on 4 L oxygen and off BiPAP.  Feeling better after thoracentesis.  Chest x-ray with reduced but residual left pleural effusion.  Currently on bicarbonate infusion and heparin infusion.  Not on pressors. AFebirle an cultur enegative. BP improved 100-119 sbp.,. and dias 50s.   Interim History / Subjective:  10/12/2021  - got hypertensive yesterday evening -> hydrocort reduced. Also lasix increased yesterday -> made urine but still net weight gain and +1.4L since admit and creat up to 2.31m%. Last evening some wheezing and transfer out of ICU  held. Currently on 2L [place].  IV heparin gtt continues. Trop down to 5200 (pk yesterday 5378). Cards looking at Right Heart cath 10/13/21  This morning back to feeling better. But in bed without moving. On 2L [place]   Objective   Blood pressure (!) 142/72, pulse 91, temperature 98 F (36.7 C), temperature source Axillary, resp. rate (!) 23, height _0  (1.6 m), weight 55.6 kg, SpO2 99 %.      Estimated body mass index is 21.71 kg/m as calculated from the following:   Height as of this encounter: _1  (1.6 m).   Weight as of this encounter: 55.6 kg.   Intake/Output Summary (Last 24 hours) at 10/12/2021 0826 Last data filed at 10/12/2021 0727 Gross per 24 hour  Intake 482.76 ml  Output 400 ml  Net 82.76 ml   Filed Weights   10/10/21 1810 10/11/21 0343 10/12/21 0500  Weight: 53.9 kg 56.1 kg 55.6 kg    Examination: General Appearance:  cachectic. IN bed. Son and daughter at bedside Head:  Normocephalic, without obvious abnormality, atraumatic Eyes:  PERRL - yes, conjunctiva/corneas - clear     Ears:  Normal external ear canals, both ears Nose:  G tube - no bu has 2L  Throat:  ETT TUBE - no , OG tube - no Neck:  Supple,  No enlargement/tenderness/nodules Lungs: Clear to auscultation bilaterally, RR 20s, mild paradoxus Heart:  S1 and S2 normal, no murmur, CVP - no.  Pressors - no but on I [place] gt Abdomen:  Soft, no masses, no organomegaly Genitalia / Rectal:  Not done Extremities:  Extremities-  intact Skin:  ntact in exposed areas . Sacral area - not examined Neurologic:  Sedation - none -> RASS - +1 . Moves all 4s - yes. CAM-ICU - neg . Orientation - x3+        Resolved Hospital Problem list    Acid-base  A: Severe lactic acidosis and metabolic acidosis at admission.?  Due to cardiac versus renal injury for myeloma versus sepsis  10/11/21 - resolved and off bicarb gtt  Plan  - monitor  Assessment & Plan:  ASSESSMENT / PLAN:   History of DVT and PE in May  2022 -prior to admission: Currently on Eliquis at admit Chronic recurrent left pleural effusion transudative  -February 2022, May 2022 in September 2022 Chronic hypoxemic resp failure -2 L [place] at home - correlating with pleural effusion left  Acute on chronic hypoxemic respiratory failure severe requiring BiPAP: Present on admission  Large recurrent pleural effusion: Present on admission 10/10/2021 - s/p 1.5L [place] with dyspnea improvement (transudate again). Dupl;ex LE 10/10/21  - neg dvt  10/12/2021 ->2 LNC 100% and improved hypoxemia. No BiPAP > 24h. CXr yesterday L > R effusion - improved since admit but not worse. This AM mild paradoxus though comfortable.   P:   O2 for p[place] ox > 92% (at home was using prn) - seems hypoxemia correlates with effusion IV heparin being given for cardiac reasons [see below] Pleural effusion workup  - get renal US  - get RUQ liver US  - get 24h ur protein collection      ?  On chronic chronic opioids  Normal mental status at admission but at risk for encephalopathy  10/12/2021 - normal mental status  P:   Monitor     Hx of Hypertension on norvasc - Prior to & Present on Admit Hx of Hyperlipidema on zocor - Prior to & Present on Admit  Hypotension at admission -responded to fluid bolus ?  Cardiac etiology versus septic versus relative adrenal insufficiency [med list shows Decadron 20 mg once a week] at admission. Duplex LE neg dVT at admit  10/12/2021 - No evidence of sepsis. Hypertensive 10/11/21 and hydrocrot reduced  P:  SBP > 95 and MAP > 65 is goal Hydrocortisone stress dose but at lower dose of 55m bid     Hypertension - Prior to & Present on Admit.  On amlodipine at home and Lasix Grade 2 diastolic dysfunction with severe elevation pulmonary artery pressures -May 2022- Prior to & Present on Admit  Acute on chronic diastolic dysfunction and severe mitral regurgitation with severe elevation pulmonary artery pressures and left  atrial dilatation -present on admission [similar to prior echo in May 2022] - At Admit  Concern for non-STEMI present on admission  - started IV hepain in ER - demand ischemia per Cards. -> [facility] admit with severe increased PASP and severe MR  10/12/2021 - pk trop 5300 . On Iv heparin. Lasix on hold after incraed creat  P: IV heparin to continue Cardiology consult appreciated -  RHC 10/13/21 Pulmonary Hypertension workup  - Check HIV, ANA, DS-DNA, SSA, SSB, SCl-70 10/13/21    Sinus tach   P: Telemetry monitoring   Preadmission acyclovir ?  Indication At risk for sepsis but no clear-cut evidence of sepsis (RVP neg, PCT < 0.1, Urine strep neg)  10/12/2021 - PCT < 0.1, afebrile, Urine strep ng  P:   Await  urine Legionella Hold acyclovir till renal failure improves -we will discuss with pharmacy about restart Monitor  off abx -Ceftriaxone 10/21 - 10/22 - Azithro 10/21- 1022 - Zosyn 10/21- 10/21  Check quantiferon gold (going to get chemo)     Chronic kidney disease secondary to myeloma-Baseline creatinine of 2.3 mg percent September 2022 Acute on chronic kidney injury with significant prerenal component -admit creatinine of 2.4 mg percent  10/12/2021: Creatinine up to 2.8 mg percent after lasix  P:  Avoid nephrotoxins Monitor with maintaining hemodynamics Hold lasix Await RHC result 10/13/21 Start 24h ur protein collection Get renal US (last sept 2022 - no hydro) Get ANCA, GBM 10/13/21   Hyperphostatemia at admit  10/23 - phos still up. Rest ok  P: Monitor     Normal liver function test at admission  10/12/2021 -eating heart healthy diet  wants to ear  P:   Heart healthy diedt PPI  HEMATOLOGIC   - HEME A:  Mild anemia present on admission hemoglobin 11.5  10/12/2021 -  hgb 10.0 and c/w ciritical illness. No bleed  P:  - PRBC for hgb </= 6.9gm%    - exceptions are   -  if ACS susepcted/confirmed then transfuse for hgb </= 8.0gm%,  or    -   active bleeding with hemodynamic instability, then transfuse regardless of hemoglobin value   At at all times try to transfuse 1 unit prbc as possible with exception of active hemorrhage    Normal platelets  P IV heparin [monitor for thrombocytopenia]    Multiple myeloma - Prior to & Present on Admit.  Untreated.  Was due to start chemotherapy 10/10/2021.  Sees [name]   Plan  - No chemo given current critically ill state - Suspect cardiac status and overall functional status needs to improve before chemo - might need onc input possibly during stay - check quantiferon gold      At risk for hypo and hyperglycemia P:   SSI  MSK/DERM Failure to thrive - Prior to & Present on Admit (admit to weight loss prior to admit and low appetite x 1 mo) Mild protein calorie malnutrition - Prior to & Present on Admit  Plan  - Nutrition consult - OOB in chair   Best Practice (right click and "Reselect all SmartList Selections" daily)   Diet/type: NPO w/ oral meds DVT prophylaxis: systemic heparin GI prophylaxis: H2B Lines: N/A Foley:  N/A Code Status:  full code  Last date of multidisciplinary goals of care discussion [detail multidisciplinary goals of care held in the presence of ER RN Elmyra Ricks and daughter and patient. On 10/10/2021  As she has a living will where she does not want to be resuscitated but only kept comfortable in the case of incurable or terminal illness or persistent vegetative state.  I saw this document.  However the current condition is that she is critically ill.  She is opted for full medical care and initially said not intubate but after further reflection she wants to be intubated for the short-term and also have CPR.  However she wants to reflect on all this.  Her decline is currently a little bit of an emotional shock to her]  10/23 - updated patient, son and daughter at bedside . D/w Dr Algis Liming of [facility].   Dispo: - move to [facility]. [facility]. CCM will  round daily    SIGNATURE    Dr. Brand Males, M.D., F.C.C.P,  Pulmonary and Critical Care Medicine Staff Physician, [place] Director - Interstitial Lung Disease  Program  Pulmonary [place]  Network at [employer], [place], [address]  NPI Number:  NPI #[id number]  Pager: [phone], If no answer  -> Check AMION or Try [phone] Telephone (clinical office): [phone] Telephone (research): [phone]  9:20 AM 10/12/2021   LABS    PULMONARY Recent Labs  Lab 10/10/21 0650 10/10/21 1453 10/11/21 1122  PHART 7.103* 7.372 7.410  PCO2ART 51.8* 32.7 32.6  PO2ART 89.0 89.5 75.9*  HCO3 15.5* 18.5* 20.2  O2SAT 91.5 96.7 95.4    CBC Recent Labs  Lab 10/10/21 1548 10/11/21 0333 10/12/21 0252  HGB 10.6* 10.4* 10.0*  HCT 34.2* 32.7* 31.6*  WBC 10.9* 9.9 8.8  PLT 218 213 194    COAGULATION Recent Labs  Lab 10/10/21 0731  INR 1.7*    CARDIAC  No results for input(s): TROPONINI in the last 168 hours. No results for input(s): PROBNP in the last 168 hours.   CHEMISTRY Recent Labs  Lab 10/10/21 0731 10/10/21 1548 10/11/21 0333 10/12/21 0252  NA 136 138 138 138  K 4.6 4.2 4.1 3.8  CL 103 107 104 104  CO2 19* 18* 24 23  GLUCOSE 322* 105* 152* 133*  BUN 74* 73* 79* 83*  CREATININE 2.43* 1.93* 2.53* 2.80*  CALCIUM 9.6 9.0 8.9 8.9  MG  --  2.3 2.2 2.3  PHOS  --  5.8* 6.1* 5.9*   Estimated Creatinine Clearance: 11.7 mL/min (A) (by C-G formula based on SCr of 2.8 mg/dL (H)).   LIVER Recent Labs  Lab 10/10/21 0731 10/10/21 1548 10/12/21 0252  AST 33 32 16  ALT 34 30 25  ALKPHOS 56 45 40  BILITOT 0.7 0.8 0.7  PROT 6.9 6.0* 5.8*  ALBUMIN 3.8 3.4* 3.2*  INR 1.7*  --   --      INFECTIOUS Recent Labs  Lab 10/10/21 0731 10/10/21 1822 10/12/21 0252  LATICACIDVEN 4.3*  --  1.3  PROCALCITON  --  <0.10  --      ENDOCRINE CBG (last 3)  No results for input(s): GLUCAP in the last 72  hours.       IMAGING x48h  - image(s) personally visualized  -   highlighted in bold DG Chest 1 View  Result Date: 10/11/2021 CLINICAL DATA:  Dyspnea and wheezing. EXAM: CHEST  1 VIEW COMPARISON:  Radiograph earlier today, additional priors reviewed. FINDINGS: Bilateral pleural effusions, left greater than right, at least moderate in size. No significant change from earlier today. Associated bibasilar opacities. Interstitial edema with slight improvement. Heart is likely enlarged, but obscured by pleural effusions on the current exam. No pneumothorax. IMPRESSION: 1. Unchanged bilateral pleural effusions, left greater than right, at least moderate in size. 2. Interstitial edema with slight improvement from earlier today. Electronically Signed   By: Keith Rake M.D.   On: 10/11/2021 19:15   DG Chest 1 View  Result Date: 10/10/2021 CLINICAL DATA:  Post thoracentesis EXAM: CHEST  1 VIEW COMPARISON:  Earlier same day FINDINGS: Decreased left pleural effusion with improved left lung aeration. Right pleural effusion is also decreased with improved right basilar aeration. Stable interstitial prominence. No pneumothorax. Normal heart size for technique. IMPRESSION: Significantly decreased left pleural effusion with improved lung aeration. No pneumothorax. Right pleural effusion has also decreased with improved right basilar aeration. Electronically Signed   By: Macy Mis M.D.   On: 10/10/2021 14:51   DG Chest Port 1 View  Result Date: 10/11/2021 CLINICAL DATA:  Follow-up pleural effusions. EXAM: PORTABLE CHEST 1  VIEW COMPARISON:  10/10/2021 FINDINGS: Stable cardiomediastinal contours. Bilateral pleural effusions are again noted left greater than right. Compared with the previous exam there is been interval increase in left pleural effusion. There is mild interstitial edema. Progressive pulmonary opacities are identified within both mid and lower lung zones, left greater than right. IMPRESSION:  1. Bilateral pleural effusions, left greater than right. 2. Mild interstitial edema with progressive bilateral pulmonary opacities, left greater than right. Electronically Signed   By: Kerby Moors M.D.   On: 10/11/2021 06:35   ECHOCARDIOGRAM COMPLETE  Result Date: 10/10/2021    ECHOCARDIOGRAM REPORT   Patient Name:   ALEXZIA KASLER Date of Exam: 10/10/2021 Medical Rec #:  [id number]             Height:       63.0 in Accession #:    [id number]            Weight:       125.1 lb Date of Birth:  September 15, 1932             BSA:          1.584 m Patient Age:    89 years              BP:           87/66 mmHg Patient Gender: F                     HR:           81 bpm. 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 Risk Factors:Hypertension and Dyslipidemia.  Sonographer:    Bernadene Person RDCS Referring Phys: [place]  1. Normal LV function; mild AI; severe MR; elevated mean gradient across MV of 5 mmHg likely at least partially related to severe MR; moderate TR.  2. Left ventricular ejection fraction, by estimation, is 60 to 65%. The left ventricle has normal function. The left ventricle has no regional wall motion abnormalities. Left ventricular diastolic parameters are indeterminate.  3. Right ventricular systolic function is normal. The right ventricular size is normal. There is moderately elevated pulmonary artery systolic pressure.  4. Left atrial size was moderately dilated.  5. Large pleural effusion in the left lateral region.  6. The mitral valve is abnormal. Severe mitral valve regurgitation. Mild mitral stenosis.  7. Tricuspid valve regurgitation is moderate.  8. The aortic valve has an indeterminant number of cusps. Aortic valve regurgitation is mild. No aortic stenosis is  present.  9. The inferior vena cava is normal in size with <50% respiratory variability, suggesting right atrial pressure of 8 mmHg. FINDINGS  Left Ventricle: Left ventricular ejection fraction, by estimation, is 60 to 65%. The left ventricle has normal function. The left ventricle has no regional wall motion abnormalities. The left ventricular internal cavity size was normal in size. There is  no left ventricular hypertrophy. Left ventricular diastolic parameters are indeterminate. Right Ventricle: The right ventricular size is normal. Right ventricular systolic function is normal. There is moderately elevated pulmonary artery systolic pressure. The tricuspid regurgitant velocity is 3.40 m/s, and with an assumed right atrial pressure of 8 mmHg, the estimated right ventricular  systolic pressure is 98.3 mmHg. Left Atrium: Left atrial size was moderately dilated. Right Atrium: Right atrial size was normal in size. Pericardium: Trivial pericardial effusion is present. Mitral Valve: The mitral valve is abnormal. There is mild thickening of the mitral valve leaflet(s). Severe mitral valve regurgitation. Mild mitral valve stenosis. Tricuspid Valve: The tricuspid valve is normal in structure. Tricuspid valve regurgitation is moderate . No evidence of tricuspid stenosis. Aortic Valve: The aortic valve has an indeterminant number of cusps. Aortic valve regurgitation is mild. Aortic regurgitation PHT measures 242 msec. No aortic stenosis is present. Pulmonic Valve: The pulmonic valve was grossly normal. Pulmonic valve regurgitation is not visualized. No evidence of pulmonic stenosis. Aorta: The aortic root is normal in size and structure. Venous: The inferior vena cava is normal in size with less than 50% respiratory variability, suggesting right atrial pressure of 8 mmHg. IAS/Shunts: No atrial level shunt detected by color flow Doppler. Additional Comments: Normal LV function; mild AI; severe MR; elevated mean gradient  across MV of 5 mmHg likely at least partially related to severe MR; moderate TR. There is a large pleural effusion in the left lateral region.  LEFT VENTRICLE PLAX 2D LVIDd:         3.60 cm LVIDs:         2.70 cm LV PW:         0.90 cm LV IVS:        0.90 cm LVOT diam:     1.50 cm LV SV:         25 LV SV Index:   16 LVOT Area:     1.77 cm  LV Volumes (MOD) LV vol d, MOD A2C: 49.8 ml LV vol d, MOD A4C: 65.4 ml LV vol s, MOD A2C: 26.8 ml LV vol s, MOD A4C: 42.0 ml LV SV MOD A2C:     23.0 ml LV SV MOD A4C:     65.4 ml LV SV MOD BP:      22.0 ml RIGHT VENTRICLE TAPSE (M-mode): 1.1 cm LEFT ATRIUM             Index        RIGHT ATRIUM           Index LA diam:        2.70 cm 1.70 cm/m   RA Area:     14.50 cm LA Vol (A2C):   47.4 ml 29.92 ml/m  RA Volume:   35.80 ml  22.60 ml/m LA Vol (A4C):   44.6 ml 28.16 ml/m LA Biplane Vol: 50.9 ml 32.13 ml/m  AORTIC VALVE LVOT Vmax:   65.60 cm/s LVOT Vmean:  43.400 cm/s LVOT VTI:    0.140 m AI PHT:      242 msec  AORTA Ao Root diam: 3.00 cm Ao Asc diam:  3.00 cm MR Peak grad:    63.8 mmHg    TRICUSPID VALVE MR Mean grad:    44.0 mmHg    TR Peak grad:   46.2 mmHg MR Vmax:         399.50 cm/s  TR Vmax:        340.00 cm/s MR Vmean:        314.0 cm/s MR PISA:         2.26 cm     SHUNTS MR PISA Eff ROA: 22 mm       Systemic VTI:  0.14 m MR PISA Radius:  0.60 cm      Systemic Diam:  1.50 cm Kirk Ruths MD Electronically signed by Kirk Ruths MD Signature Date/Time: 10/10/2021/2:04:01 PM    Final    VAS [place] LOWER EXTREMITY VENOUS (DVT)  Result Date: 10/11/2021  Lower Venous DVT Study Patient Name:  SAPHYRA HUTT  Date of Exam:   10/10/2021 Medical Rec #: [id number]              Accession #:    [id number] Date of Birth: 14-Mar-1932              Patient Gender: F Patient Age:   39 years Exam [facility]  [facility] Procedure:      VAS [place] LOWER EXTREMITY VENOUS (DVT) Referring Phys: Brand Males  --------------------------------------------------------------------------------  Indications: Edema, and history of DVT.  Comparison Study: 04/25/2021 bilateral lower extremity venous duplex- acute DVT                   right soleal veins and left PTV. Performing Technologist: Maudry Mayhew MHA, RDMS, RVT, RDCS  Examination Guidelines: A complete evaluation includes B-mode imaging, spectral Doppler, color Doppler, and power Doppler as needed of all accessible portions of each vessel. Bilateral testing is considered an integral part of a complete examination. Limited examinations for reoccurring indications may be performed as noted. The reflux portion of the exam is performed with the patient in reverse Trendelenburg.  +---------+---------------+---------+-----------+----------+--------------+ RIGHT    CompressibilityPhasicitySpontaneityPropertiesThrombus Aging +---------+---------------+---------+-----------+----------+--------------+ CFV      Full           No       Yes                                 +---------+---------------+---------+-----------+----------+--------------+ SFJ      Full                                                        +---------+---------------+---------+-----------+----------+--------------+ FV Prox  Full                                                        +---------+---------------+---------+-----------+----------+--------------+ FV Mid   Full                                                        +---------+---------------+---------+-----------+----------+--------------+ FV DistalFull                                                        +---------+---------------+---------+-----------+----------+--------------+ PFV      Full                                                        +---------+---------------+---------+-----------+----------+--------------+  POP      Full           No       Yes                                  +---------+---------------+---------+-----------+----------+--------------+ PTV      Full                                                        +---------+---------------+---------+-----------+----------+--------------+ PERO     Full                                                        +---------+---------------+---------+-----------+----------+--------------+   Right Technical Findings: Not visualized segments include limited visualization PTV and peroneal veins.  +---------+---------------+---------+-----------+----------+--------------+ LEFT     CompressibilityPhasicitySpontaneityPropertiesThrombus Aging +---------+---------------+---------+-----------+----------+--------------+ CFV      Full           No       Yes                                 +---------+---------------+---------+-----------+----------+--------------+ SFJ      Full                                                        +---------+---------------+---------+-----------+----------+--------------+ FV Prox  Full                                                        +---------+---------------+---------+-----------+----------+--------------+ FV Mid   Full                                                        +---------+---------------+---------+-----------+----------+--------------+ FV DistalFull                                                        +---------+---------------+---------+-----------+----------+--------------+ PFV      Full                                                        +---------+---------------+---------+-----------+----------+--------------+ POP      Full           No       Yes                                 +---------+---------------+---------+-----------+----------+--------------+  Left Technical Findings: Not visualized segments include PTV, peroneal veins.   Summary: RIGHT: - There is no evidence of deep vein thrombosis in the lower extremity.  However, portions of this examination were limited- see technologist comments above.  - No cystic structure found in the popliteal fossa.  LEFT: - There is no evidence of deep vein thrombosis in the lower extremity. However, portions of this examination were limited- see technologist comments above.  - No cystic structure found in the popliteal fossa.  Bilateral lower extremity veins exhibit pulsatile flow, suggestive of possibly elevated right heart pressure.  *See table(s) above for measurements and observations. Electronically signed by Deitra Mayo MD on 10/11/2021 at 5:14:55 AM.    Final    US THORACENTESIS ASP PLEURAL SPACE W/IMG GUIDE  Result Date: 10/10/2021 INDICATION: Shortness of breath with hypoxia. Large left pleural effusion. Request for diagnostic and therapeutic thoracentesis, some maximum of 1.5 L. EXAM: ULTRASOUND GUIDED LEFT THORACENTESIS MEDICATIONS: 1% plain lidocaine, 5 mL COMPLICATIONS: None immediate. PROCEDURE: An ultrasound guided thoracentesis was thoroughly discussed with the patient and questions answered. The benefits, risks, alternatives and complications were also discussed. The patient understands and wishes to proceed with the procedure. Written consent was obtained. Ultrasound was performed to localize and mark an adequate pocket of fluid in the left chest. The area was then prepped and draped in the normal sterile fashion. 1% Lidocaine was used for local anesthesia. Under ultrasound guidance a 6 Fr Safe-T-Centesis catheter was introduced. Thoracentesis was performed. The catheter was removed and a dressing applied. FINDINGS: A total of approximately 1.5 L of clear yellow fluid was removed. Samples were sent to the laboratory as requested by the clinical team. IMPRESSION: Successful ultrasound guided left thoracentesis yielding 1.5 L of pleural fluid. Read by: Ascencion Dike PA-C Electronically Signed   By: Sandi Mariscal M.D.   On: 10/10/2021 14:49

## 2021-10-12 NOTE — Progress Notes (Signed)
ANTICOAGULATION CONSULT NOTE - follow up  Pharmacy Consult for Heparin Indication: chest pain/ACS  Allergies  Allergen Reactions   Sulfa Antibiotics Nausea Only    Patient Measurements: Height: 5\' 3"  (160 cm) Weight: 56.1 kg (123 lb 10.9 oz) IBW/kg (Calculated) : 52.4 Heparin Dosing Weight: TBW  Vital Signs: Temp: 98 F (36.7 C) (10/23 0330) Temp Source: Axillary (10/23 0330) BP: 136/76 (10/23 0300) Pulse Rate: 96 (10/23 0300)  Labs: Recent Labs    10/10/21 0731 10/10/21 0935 10/10/21 1130 10/10/21 1548 10/10/21 1822 10/10/21 1920 10/11/21 0333 10/11/21 1246 10/12/21 0252  HGB 11.5*  --   --  10.6*  --   --  10.4*  --  10.0*  HCT 37.4  --   --  34.2*  --   --  32.7*  --  31.6*  PLT 311  --   --  218  --   --  213  --  194  APTT  --   --    < >  --   --  58* 78* 70* 69*  LABPROT 19.7*  --   --   --   --   --   --   --   --   INR 1.7*  --   --   --   --   --   --   --   --   HEPARINUNFRC  --   --   --   --   --  >1.10* >1.10*  --  >1.10*  CREATININE 2.43*  --   --  1.93*  --   --  2.53*  --  2.80*  TROPONINIHS 523* 836*  --  3,307* 5,378*  --   --   --   --    < > = values in this interval not displayed.     Estimated Creatinine Clearance: 11.7 mL/min (A) (by C-G formula based on SCr of 2.8 mg/dL (H)).   Medical History: Past Medical History:  Diagnosis Date   Abnormal EKG    Carpal tunnel syndrome    CKD (chronic kidney disease), stage III (HCC)    DDD (degenerative disc disease), lumbar    Hypertension    Hypertension with renal disease    Menopause    Mixed hyperlipidemia    Muscular degeneration    Nonrheumatic aortic (valve) insufficiency    Nonrheumatic mitral valve regurgitation    Osteoarthritis    Pedal edema    Pleural effusion    Skin cancer    Vitamin D deficiency     Medications:  Scheduled:   aspirin EC  81 mg Oral Daily   Chlorhexidine Gluconate Cloth  6 each Topical Daily   famotidine  20 mg Oral Daily   furosemide  40 mg  Intravenous BID   hydrocortisone sod succinate (SOLU-CORTEF) inj  50 mg Intravenous Q12H   mouth rinse  15 mL Mouth Rinse BID   Infusions:   sodium chloride Stopped (10/10/21 1902)   sodium chloride Stopped (10/10/21 2039)   heparin 750 Units/hr (10/12/21 0339)    Assessment: 20 yoF presented to ED with ShOB.  Heparin per pharmacy for ACS.  PTA Eliquis held, last dose 10/20 at 18:00 Troponin: 523, 836 >> 5378 CBC:  Hgb 11.5, Plt WNL Baseline aPTT 29 seconds, INR 1.7  10/12/21 PM: -aPTT therapeutic at 69 seconds, HL elevated >1.1 as expected with recent apixaban administration - Hgb 10. Plts WNL - Scr up to 2.8 -No issues with line or  bleeding per RN  Goal of Therapy:  Heparin level 0.3-0.7 units/ml aPTT 66-102 seconds Monitor platelets by anticoagulation protocol: Yes   Plan:  Continue heparin drip at 750 units/hr Daily aPTT/heparin level and CBC Continue to monitor H&H and platelets  Dolly Rias RPh 10/12/2021, 4:06 AM

## 2021-10-13 ENCOUNTER — Encounter (HOSPITAL_COMMUNITY)
Admission: EM | Disposition: A | Discharge status: Home Health | Payer: Self-pay | Source: Home / Self Care | Attending: Internal Medicine

## 2021-10-13 DIAGNOSIS — J9621 Acute and chronic respiratory failure with hypoxia: Secondary | ICD-10-CM | POA: Diagnosis not present

## 2021-10-13 DIAGNOSIS — I2729 Other secondary pulmonary hypertension: Secondary | ICD-10-CM

## 2021-10-13 DIAGNOSIS — I5033 Acute on chronic diastolic (congestive) heart failure: Secondary | ICD-10-CM | POA: Diagnosis present

## 2021-10-13 DIAGNOSIS — N179 Acute kidney failure, unspecified: Secondary | ICD-10-CM | POA: Diagnosis not present

## 2021-10-13 DIAGNOSIS — I340 Nonrheumatic mitral (valve) insufficiency: Secondary | ICD-10-CM | POA: Diagnosis not present

## 2021-10-13 HISTORY — PX: RIGHT HEART CATH: CATH118263

## 2021-10-13 LAB — BASIC METABOLIC PANEL
Anion gap: 11 (ref 5–15)
BUN: 88 mg/dL — ABNORMAL HIGH (ref 8–23)
CO2: 23 mmol/L (ref 22–32)
Calcium: 8.7 mg/dL — ABNORMAL LOW (ref 8.9–10.3)
Chloride: 104 mmol/L (ref 98–111)
Creatinine, Ser: 2.48 mg/dL — ABNORMAL HIGH (ref 0.44–1.00)
GFR, Estimated: 18 mL/min — ABNORMAL LOW (ref >60)
Glucose, Bld: 115 mg/dL — ABNORMAL HIGH (ref 70–99)
Potassium: 3.3 mmol/L — ABNORMAL LOW (ref 3.5–5.1)
Sodium: 138 mmol/L (ref 135–145)

## 2021-10-13 LAB — POCT I-STAT EG7
Acid-base deficit: 1 mmol/L (ref 0.0–2.0)
Acid-base deficit: 2 mmol/L (ref 0.0–2.0)
Bicarbonate: 23.1 mmol/L (ref 20.0–28.0)
Bicarbonate: 24.2 mmol/L (ref 20.0–28.0)
Calcium, Ion: 1.12 mmol/L — ABNORMAL LOW (ref 1.15–1.40)
Calcium, Ion: 1.23 mmol/L (ref 1.15–1.40)
HCT: 30 % — ABNORMAL LOW (ref 36.0–46.0)
HCT: 32 % — ABNORMAL LOW (ref 36.0–46.0)
Hemoglobin: 10.2 g/dL — ABNORMAL LOW (ref 12.0–15.0)
Hemoglobin: 10.9 g/dL — ABNORMAL LOW (ref 12.0–15.0)
O2 Saturation: 62 %
O2 Saturation: 62 %
Potassium: 3.3 mmol/L — ABNORMAL LOW (ref 3.5–5.1)
Potassium: 3.5 mmol/L (ref 3.5–5.1)
Sodium: 141 mmol/L (ref 135–145)
Sodium: 143 mmol/L (ref 135–145)
TCO2: 24 mmol/L (ref 22–32)
TCO2: 25 mmol/L (ref 22–32)
pCO2, Ven: 38.4 mmHg — ABNORMAL LOW (ref 44.0–60.0)
pCO2, Ven: 39.8 mmHg — ABNORMAL LOW (ref 44.0–60.0)
pH, Ven: 7.387 (ref 7.250–7.430)
pH, Ven: 7.392 (ref 7.250–7.430)
pO2, Ven: 32 mmHg (ref 32.0–45.0)
pO2, Ven: 32 mmHg (ref 32.0–45.0)

## 2021-10-13 LAB — PROTEIN, URINE, 24 HOUR
Collection Interval-UPROT: 24 hours
Protein, 24H Urine: 173 mg/d — ABNORMAL HIGH (ref 50–100)
Protein, Urine: 23 mg/dL
Urine Total Volume-UPROT: 750 mL

## 2021-10-13 LAB — CBC
HCT: 31.6 % — ABNORMAL LOW (ref 36.0–46.0)
Hemoglobin: 9.9 g/dL — ABNORMAL LOW (ref 12.0–15.0)
MCH: 29.1 pg (ref 26.0–34.0)
MCHC: 31.3 g/dL (ref 30.0–36.0)
MCV: 92.9 fL (ref 80.0–100.0)
Platelets: 191 10*3/uL (ref 150–400)
RBC: 3.4 MIL/uL — ABNORMAL LOW (ref 3.87–5.11)
RDW: 15 % (ref 11.5–15.5)
WBC: 7.5 10*3/uL (ref 4.0–10.5)
nRBC: 0 % (ref 0.0–0.2)

## 2021-10-13 LAB — APTT: aPTT: 47 seconds — ABNORMAL HIGH (ref 24–36)

## 2021-10-13 LAB — HIV ANTIBODY (ROUTINE TESTING W REFLEX): HIV Screen 4th Generation wRfx: NONREACTIVE

## 2021-10-13 LAB — HEPARIN LEVEL (UNFRACTIONATED): Heparin Unfractionated: 1.04 IU/mL — ABNORMAL HIGH (ref 0.30–0.70)

## 2021-10-13 SURGERY — RIGHT HEART CATH

## 2021-10-13 MED ORDER — HYDRALAZINE HCL 20 MG/ML IJ SOLN: 10.0000 mg | INTRAMUSCULAR | Status: DC | PRN

## 2021-10-13 MED ORDER — FUROSEMIDE 10 MG/ML IJ SOLN
60.0000 mg | Freq: Two times a day (BID) | INTRAMUSCULAR | Status: DC
  Administered 2021-10-13 – 2021-10-15 (×4): 60 mg via INTRAVENOUS
  Filled 2021-10-13 (×4): qty 6

## 2021-10-13 MED ORDER — SODIUM CHLORIDE 0.9 % IV SOLN
INTRAVENOUS | Status: DC
  Administered 2021-10-13: 10 mL/h via INTRAVENOUS

## 2021-10-13 MED ORDER — SODIUM CHLORIDE 0.9% FLUSH
3.0000 mL | INTRAVENOUS | Status: DC | PRN
Start: 2021-10-13 — End: 2021-10-13

## 2021-10-13 MED ORDER — HEPARIN (PORCINE) 25000 UT/250ML-% IV SOLN: 900.0000 [IU]/h | INTRAVENOUS | Status: DC

## 2021-10-13 MED ORDER — HEPARIN (PORCINE) IN NACL 1000-0.9 UT/500ML-% IV SOLN
INTRAVENOUS | Status: DC | PRN
  Administered 2021-10-13: 500 mL

## 2021-10-13 MED ORDER — ACETAMINOPHEN 325 MG PO TABS
650.0000 mg | ORAL_TABLET | ORAL | Status: DC | PRN
  Administered 2021-10-23 – 2021-10-24 (×2): 650 mg via ORAL
  Filled 2021-10-13 (×2): qty 2

## 2021-10-13 MED ORDER — HEPARIN (PORCINE) 25000 UT/250ML-% IV SOLN
650.0000 [IU]/h | INTRAVENOUS | Status: DC
  Administered 2021-10-13: 650 [IU]/h via INTRAVENOUS

## 2021-10-13 MED ORDER — ONDANSETRON HCL 4 MG/2ML IJ SOLN: 4.0000 mg | Freq: Four times a day (QID) | INTRAMUSCULAR | Status: DC | PRN

## 2021-10-13 MED ORDER — SODIUM CHLORIDE 0.9% FLUSH
3.0000 mL | Freq: Two times a day (BID) | INTRAVENOUS | Status: DC
  Administered 2021-10-14 – 2021-10-15 (×3): 3 mL via INTRAVENOUS

## 2021-10-13 MED ORDER — SODIUM CHLORIDE 0.9 % IV SOLN
250.0000 mL | INTRAVENOUS | Status: DC | PRN
Start: 2021-10-13 — End: 2021-10-15

## 2021-10-13 MED ORDER — SODIUM CHLORIDE 0.9 % IV SOLN: 250.0000 mL | INTRAVENOUS | Status: DC | PRN

## 2021-10-13 MED ORDER — LABETALOL HCL 5 MG/ML IV SOLN: 10.0000 mg | INTRAVENOUS | Status: DC | PRN

## 2021-10-13 MED ORDER — POTASSIUM CHLORIDE CRYS ER 20 MEQ PO TBCR
20.0000 meq | EXTENDED_RELEASE_TABLET | Freq: Once | ORAL | Status: AC
  Administered 2021-10-13: 20 meq via ORAL
  Filled 2021-10-13: qty 1

## 2021-10-13 MED ORDER — HEPARIN (PORCINE) IN NACL 1000-0.9 UT/500ML-% IV SOLN
INTRAVENOUS | Status: AC
  Filled 2021-10-13: qty 500

## 2021-10-13 MED ORDER — SODIUM CHLORIDE 0.9% FLUSH
3.0000 mL | Freq: Two times a day (BID) | INTRAVENOUS | Status: DC
  Administered 2021-10-13 – 2021-10-25 (×15): 3 mL via INTRAVENOUS

## 2021-10-13 MED ORDER — LIDOCAINE HCL (PF) 1 % IJ SOLN
INTRAMUSCULAR | Status: AC
  Filled 2021-10-13: qty 30

## 2021-10-13 MED ORDER — LIDOCAINE HCL (PF) 1 % IJ SOLN
INTRAMUSCULAR | Status: DC | PRN
  Administered 2021-10-13: 2 mL

## 2021-10-13 MED ORDER — POTASSIUM CHLORIDE CRYS ER 20 MEQ PO TBCR
40.0000 meq | EXTENDED_RELEASE_TABLET | Freq: Once | ORAL | Status: AC
  Administered 2021-10-13: 40 meq via ORAL
  Filled 2021-10-13: qty 2

## 2021-10-13 MED ORDER — SODIUM CHLORIDE 0.9 % IV SOLN: INTRAVENOUS | Status: DC

## 2021-10-13 MED ORDER — SODIUM CHLORIDE 0.9% FLUSH: 3.0000 mL | INTRAVENOUS | Status: DC | PRN

## 2021-10-13 SURGICAL SUPPLY — 4 items
CATH BALLN WEDGE 5F 110CM (CATHETERS) ×1 IMPLANT
PACK CARDIAC CATHETERIZATION (CUSTOM PROCEDURE TRAY) ×2 IMPLANT
SHEATH GLIDE SLENDER 4/5FR (SHEATH) ×1 IMPLANT
TRANSDUCER W/STOPCOCK (MISCELLANEOUS) ×2 IMPLANT

## 2021-10-13 NOTE — Progress Notes (Signed)
PROGRESS NOTE   Jillian Cooley  MBB:[id number]    DOB: [birth year]    DOA: 10/10/2021  PCP: [name], MD   I have briefly reviewed patients previous medical records in [facility].  Chief Complaint  Patient presents with   Shortness of Breath    Brief Narrative:  87 year old female, lives with spouse, independent, on as needed home oxygen, medical history significant for hospitalization in May 2022 for right rotator cuff repair complicated by acute hypoxia and found to have bilateral DVT, PE and left pleural effusion, started on Eliquis, underwent left thoracentesis of 800 mL, recurrent left pleural effusion in September 2022 and again underwent thoracentesis with transudative effusion, now readmitted with acute respiratory failure with hypoxia requiring BiPAP in ED.  Admitted to ICU by CCM for acute on chronic hypoxic respiratory failure due to large recurrent left pleural effusion, hypotension, acute on chronic diastolic CHF, severe MR, pulmonary hypertension and NSTEMI.  S/p 1.5 L left thoracentesis.  Remains on IV heparin drip.  Cardiology consulting.  Possible right heart cath 10/24.  Medical history also significant for stage IV chronic kidney disease, HTN, HLD, mitral regurgitation.  S/p 1.2 L left thoracentesis 10/23.   Assessment & Plan:  Active Problems:   Pleural effusion, left   Acute respiratory failure (HCC)   Pressure injury of skin   Dyspnea   Acute on chronic hypoxic respiratory failure - On as needed home oxygen.  Required BiPAP in ED.  Currently weaned down to 2 L/min [place] O2. - Multifactorial due to large left pleural effusion, recurrent and acute on chronic diastolic CHF. - S/p 1.5 L thoracentesis 10/21 and 1.2 L on 10/23 by IR.  Transudative effusion.  Pulmonology following. - S/p IV Lasix but creatinine gradually rising - Continue IV heparin.  On Eliquis at home for history of DVT/PE. - Lower extremity venous Dopplers negative for DVT. -  Improved.  Currently saturating in the high 90s-100% on 2 L/min [place] oxygen, wean as tolerated.  Acute on chronic left pleural effusion, recurrent - Has required repeated therapeutic thoracentesis. - Reportedly has been transudative as per pulmonology. - May need to address primary etiology.  Right heart catheterization may help. - Diuretic management is limited due to worsening CKD. - Pulmonology working up further with renal ultrasound (no hydronephrosis), RUQ liver ultrasound (fatty liver, right pleural effusion and 24-hour protein collection. -Had sent out extensive lab work yesterday and that needs to be followed including autoimmune work-up. - S/p second left ultrasound-guided thoracentesis this admission by IR on 10/23.  Improved.  Acute on chronic diastolic CHF/severe MR/severe pulmonary artery hypertension. - Chest x-ray suggestive of pulmonary edema.  - S/p IV Lasix 20 mg yesterday and creatinine has increased. - Cardiology following. - Clinically euvolemic.  NSTEMI type II - Cardiology following and feel that this is due to demand ischemia. - Troponin peaked to 5300. - 2D echo: LVEF 60 to 65%, no wall motion abnormalities.  Moderate to severe MR, moderate TR. - Remains on IV heparin instead of home Eliquis. - Not a left heart cath candidate which may push her into ESRD and does not appear to be good long-term HD candidate. - Continue aspirin (while on heparin and off Eliquis, can stop when back on Eliquis) and statins.  Second-degree type I heart block - Transiently noted on telemetry overnight 10/23. - Replace potassium.  Magnesium normal. - Cardiology to review.  History of DVT and PE May 2022 - Now on IV heparin instead of home  Eliquis due to need for interventions and NSTEMI  Essential hypertension - Blood pressures controlled off of antihypertensives. - Off antihypertensives on admission due to hypotension.  Hyperlipidemia - Continue statins.  Hypotension on  admission - Responded to IV fluid bolus. - Etiology?  Cardiac versus septic versus relative adrenal insufficiency. - Home med list showed Decadron 20 mg once a week on admission - Per pulmonology, no evidence of sepsis.  Sepsis ruled out.  Stress dose hydrocortisone being weaned off.    Preadmission acyclovir?  Indication. -Holding acyclovir until renal functions improved.  Acute kidney injury complicating stage IV chronic kidney disease. - Recent baseline creatinine 2.3. - Follows with Dr. Valentino [facility] at [facility] - Creatinine has increased to 2.8 after IV Lasix.  Lasix on hold. - Awaiting RHC 10/24 to determine volume status and further management. - Pulmonology working up with renal ultrasound, labs (ANCA and GBM) - Creatinine down to 2.5 today.  Hypokalemia - Replace and follow.  Hyperphosphatemia - Monitor.  Anemia - Mild hemoglobin drop consistent with critical illness.  Stable in the 10 g range. - Follow and transfuse if hemoglobin 7 g or less.  Multiple myeloma - Follows with Dr. Leonides Schanz, oncology. - Was supposed to start chemotherapy on 10/10/2021.  Unable to start at this time due to hospitalization and acute illness. - Added oncologist to care team so he is aware of her admission.  Mild protein calorie malnutrition - Agree with nutrition consultation.  Adult failure to thrive - Multifactorial due to very advanced age, frail physical health and multiple severe significant comorbidities.  Lactic acidosis - Resolved.   Body mass index is 21.71 kg/m.  Nutritional Status        Pressure Ulcer:     DVT prophylaxis:   Currently on IV heparin drip.   Code Status: Full Code Family Communication: None at bedside today. Disposition:  Status is: Inpatient  Remains inpatient appropriate because: Very ill patient with complex multiple medical problems as noted above, remains on IV heparin drip with plans for right heart cath in a.m.        Consultants:    PCCM Cardiology  Procedures:   Left thoracentesis x2 as noted above.    Antimicrobials:    Anti-infectives (From admission, onward)    Start     Dose/Rate Route Frequency Ordered Stop   10/10/21 1900  cefTRIAXone (ROCEPHIN) 2 g in sodium chloride 0.9 % 100 mL IVPB  Status:  Discontinued        2 g 200 mL/hr over 30 Minutes Intravenous Every 24 hours 10/10/21 1457 10/11/21 1039   10/10/21 1900  azithromycin (ZITHROMAX) 500 mg in sodium chloride 0.9 % 250 mL IVPB  Status:  Discontinued        500 mg 250 mL/hr over 60 Minutes Intravenous Every 24 hours 10/10/21 1457 10/11/21 1015   10/10/21 1045  piperacillin-tazobactam (ZOSYN) IVPB 3.375 g        3.375 g 100 mL/hr over 30 Minutes Intravenous  Once 10/10/21 1034 10/10/21 1255   10/10/21 1030  piperacillin-tazobactam (ZOSYN) IVPB 2.25 g  Status:  Discontinued        2.25 g 100 mL/hr over 30 Minutes Intravenous  Once 10/10/21 1017 10/10/21 1034         Subjective:  Feels much better.  Dyspnea significantly improved and feels that her breathing is almost back to her baseline.  Slept well last night for the first time in a long time.  Objective:   Vitals:  10/13/21 0500 10/13/21 0600 10/13/21 0700 10/13/21 0715  BP: (!) 112/54 (!) 109/40 123/68   Pulse: 82 (!) 45 94 93  Resp: $Remo'12 12 18 15  'NFfKq$ Temp:      TempSrc:      SpO2: 100% 100% 97% 98%  Weight:      Height:        General exam: Elderly female, small built, frail, lying comfortably propped up in bed without distress.  Looks improved compared to yesterday. Respiratory system: Improved breath sounds in the left base.  Still with a little diminished breath sounds in both bases.  Rest of lung fields clear to auscultation.  No increased work of breathing even while speaking or sitting up in bed. Cardiovascular system: S1 & S2 heard, RRR. No murmurs, rubs, gallops or clicks.  No ankle edema.  No JVD.  Telemetry personally reviewed: Sinus rhythm.  Transient episode of type I  second-degree AV block (Cardiology to review) Gastrointestinal system: Abdomen is nondistended, soft and nontender. No organomegaly or masses felt. Normal bowel sounds heard. Central nervous system: Alert and oriented. No focal neurological deficits. Extremities: Symmetric 5 x 5 power.   Skin: No rashes, lesions or ulcers Psychiatry: Judgement and insight appear normal. Mood & affect appropriate.     Data Reviewed:   I have personally reviewed following labs and imaging studies   CBC: Recent Labs  Lab 10/10/21 0731 10/10/21 1548 10/11/21 0333 10/12/21 0252 10/13/21 0258  WBC 10.1 10.9* 9.9 8.8 7.5  NEUTROABS 8.4* 9.5*  --   --   --   HGB 11.5* 10.6* 10.4* 10.0* 9.9*  HCT 37.4 34.2* 32.7* 31.6* 31.6*  MCV 96.1 94.5 92.6 93.2 92.9  PLT 311 218 213 194 492    Basic Metabolic Panel: Recent Labs  Lab 10/10/21 1548 10/11/21 0333 10/12/21 0252 10/13/21 0258  NA 138 138 138 138  K 4.2 4.1 3.8 3.3*  CL 107 104 104 104  CO2 18* $Remov'24 23 23  'oKYxGh$ GLUCOSE 105* 152* 133* 115*  BUN 73* 79* 83* 88*  CREATININE 1.93* 2.53* 2.80* 2.48*  CALCIUM 9.0 8.9 8.9 8.7*  MG 2.3 2.2 2.3  --   PHOS 5.8* 6.1* 5.9*  --     Liver Function Tests: Recent Labs  Lab 10/10/21 0731 10/10/21 1548 10/12/21 0252  AST 33 32 16  ALT 34 30 25  ALKPHOS 56 45 40  BILITOT 0.7 0.8 0.7  PROT 6.9 6.0* 5.8*  ALBUMIN 3.8 3.4* 3.2*    CBG: No results for input(s): GLUCAP in the last 168 hours.  Microbiology Studies:   Recent Results (from the past 240 hour(s))  Resp Panel by RT-PCR (Flu A&B, Covid) Nasopharyngeal Swab     Status: None   Collection Time: 10/10/21  8:57 AM   Specimen: Nasopharyngeal Swab; Nasopharyngeal(NP) swabs in vial transport medium  Result Value Ref Range Status   SARS Coronavirus 2 by RT PCR NEGATIVE NEGATIVE Final    Comment: (NOTE) SARS-CoV-2 target nucleic acids are NOT DETECTED.  The SARS-CoV-2 RNA is generally detectable in upper respiratory specimens during the acute  phase of infection. The lowest concentration of SARS-CoV-2 viral copies this assay can detect is 138 copies/mL. A negative result does not preclude SARS-Cov-2 infection and should not be used as the sole basis for treatment or other patient management decisions. A negative result may occur with  improper specimen collection/handling, submission of specimen other than nasopharyngeal swab, presence of viral mutation(s) within the areas targeted by this assay, and  inadequate number of viral copies(<138 copies/mL). A negative result must be combined with clinical observations, patient history, and epidemiological information. The expected result is Negative.  Fact Sheet for Patients:  EntrepreneurPulse.com.au  Fact Sheet for Healthcare Providers:  IncredibleEmployment.be  This test is no t yet approved or cleared by the [place] FDA and  has been authorized for detection and/or diagnosis of SARS-CoV-2 by FDA under an Emergency Use Authorization (EUA). This EUA will remain  in effect (meaning this test can be used) for the duration of the COVID-19 declaration under Section 564(b)(1) of the Act, 21 U.S.C.section 360bbb-3(b)(1), unless the authorization is terminated  or revoked sooner.       Influenza A by PCR NEGATIVE NEGATIVE Final   Influenza B by PCR NEGATIVE NEGATIVE Final    Comment: (NOTE) The Xpert Xpress SARS-CoV-2/FLU/RSV plus assay is intended as an aid in the diagnosis of influenza from Nasopharyngeal swab specimens and should not be used as a sole basis for treatment. Nasal washings and aspirates are unacceptable for Xpert Xpress SARS-CoV-2/FLU/RSV testing.  Fact Sheet for Patients: EntrepreneurPulse.com.au  Fact Sheet for Healthcare Providers: IncredibleEmployment.be  This test is not yet approved or cleared by the [place] FDA and has been authorized for detection and/or diagnosis of  SARS-CoV-2 by FDA under an Emergency Use Authorization (EUA). This EUA will remain in effect (meaning this test can be used) for the duration of the COVID-19 declaration under Section 564(b)(1) of the Act, 21 U.S.C. section 360bbb-3(b)(1), unless the authorization is terminated or revoked.  Performed at [facility], [place] [address] [place], [place] [address]   Culture, blood (routine x 2)     Status: None (Preliminary result)   Collection Time: 10/10/21 10:08 AM   Specimen: BLOOD  Result Value Ref Range Status   Specimen Description   Final    BLOOD RIGHT ANTECUBITAL Performed at [place] [address] [place], [place] [address]    Special Requests   Final    BOTTLES DRAWN AEROBIC AND ANAEROBIC Blood Culture results may not be optimal due to an inadequate volume of blood received in culture bottles Performed at [place] [address], [place], [place] [address]    Culture   Final    NO GROWTH 3 DAYS Performed at [facility], [place] [address] [place], [place] [address]    Report Status PENDING  Incomplete  Body fluid culture w Gram Stain     Status: None (Preliminary result)   Collection Time: 10/10/21  2:21 PM   Specimen: PATH Cytology Pleural fluid  Result Value Ref Range Status   Specimen Description   Final    PLEURAL Performed at [place] [address] [place], [place] [address]    Special Requests   Final    NONE Performed at [facility], [place] [address] [place], [place] [address]    Gram Stain   Final    NO SQUAMOUS EPITHELIAL CELLS SEEN FEW WBC SEEN NO ORGANISMS SEEN    Culture   Final    NO GROWTH 1 DAY Performed at [facility], [place] [address], [place], [place] [address]    Report Status PENDING  Incomplete  MRSA Next Gen by PCR, Nasal     Status: None   Collection Time: 10/10/21  5:34 PM   Specimen: Nasal Mucosa; Nasal Swab   Result Value Ref Range Status   MRSA by PCR Next  Gen NOT DETECTED NOT DETECTED Final    Comment: (NOTE) The GeneXpert MRSA Assay (FDA approved for NASAL specimens only), is one component of a comprehensive MRSA colonization surveillance program. It is not intended to diagnose MRSA infection nor to guide or monitor treatment for MRSA infections. Test performance is not FDA approved in patients less than 65 years old. Performed at [facility], [place] [address], [place], [place] [address]   Culture, blood (routine x 2)     Status: None (Preliminary result)   Collection Time: 10/10/21  6:22 PM   Specimen: BLOOD LEFT FOREARM  Result Value Ref Range Status   Specimen Description   Final    BLOOD LEFT FOREARM Performed at [place] [address], [place], [place] [address]    Special Requests   Final    BOTTLES DRAWN AEROBIC ONLY Blood Culture adequate volume Performed at [place] [address] [place], [place] [address]    Culture   Final    NO GROWTH 3 DAYS Performed at [facility], [place] [address] [place], [place] [address]    Report Status PENDING  Incomplete  Respiratory (~20 pathogens) panel by PCR     Status: None   Collection Time: 10/11/21 10:29 AM   Specimen: Nasopharyngeal Swab; Respiratory  Result Value Ref Range Status   Adenovirus NOT DETECTED NOT DETECTED Final   Coronavirus 229E NOT DETECTED NOT DETECTED Final    Comment: (NOTE) The Coronavirus on the Respiratory Panel, DOES NOT test for the novel  Coronavirus (2019 nCoV)    Coronavirus HKU1 NOT DETECTED NOT DETECTED Final   Coronavirus NL63 NOT DETECTED NOT DETECTED Final   Coronavirus OC43 NOT DETECTED NOT DETECTED Final   Metapneumovirus NOT DETECTED NOT DETECTED Final   Rhinovirus / Enterovirus NOT DETECTED NOT DETECTED Final   Influenza A NOT DETECTED NOT DETECTED Final   Influenza B NOT DETECTED NOT DETECTED Final   Parainfluenza  Virus 1 NOT DETECTED NOT DETECTED Final   Parainfluenza Virus 2 NOT DETECTED NOT DETECTED Final   Parainfluenza Virus 3 NOT DETECTED NOT DETECTED Final   Parainfluenza Virus 4 NOT DETECTED NOT DETECTED Final   Respiratory Syncytial Virus NOT DETECTED NOT DETECTED Final   Bordetella pertussis NOT DETECTED NOT DETECTED Final   Bordetella Parapertussis NOT DETECTED NOT DETECTED Final   Chlamydophila pneumoniae NOT DETECTED NOT DETECTED Final   Mycoplasma pneumoniae NOT DETECTED NOT DETECTED Final    Comment: Performed at [facility], [place]. [address] [place], [place] [address]     Radiology Studies:  DG Chest 1 View  Result Date: 10/12/2021 CLINICAL DATA:  Post left-sided thoracentesis. EXAM: CHEST  1 VIEW COMPARISON:  Radiographs 10/12/2021 and 10/11/2021. FINDINGS: 1255 hours. Interval significant decrease in size of previously demonstrated left pleural effusion with improved aeration of the left lung base. Small right pleural effusion and right basilar atelectasis remain. The heart size and mediastinal contours are stable with aortic atherosclerosis. No evidence of pneumothorax. The bones appear unchanged status post lower cervical fusion. Telemetry leads overlie the chest. IMPRESSION: Interval improved left pleural effusion and left basilar aeration following thoracentesis. No pneumothorax. Electronically Signed   By: Richardean Sale M.D.   On: 10/12/2021 13:03   DG Chest 1 View  Result Date: 10/11/2021 CLINICAL DATA:  Dyspnea and wheezing. EXAM: CHEST  1 VIEW COMPARISON:  Radiograph earlier today, additional priors reviewed. FINDINGS: Bilateral pleural effusions, left greater than right, at least moderate  in size. No significant change from earlier today. Associated bibasilar opacities. Interstitial edema with slight improvement. Heart is likely enlarged, but obscured by pleural effusions on the current exam. No pneumothorax. IMPRESSION: 1. Unchanged bilateral pleural effusions, left  greater than right, at least moderate in size. 2. Interstitial edema with slight improvement from earlier today. Electronically Signed   By: Keith Rake M.D.   On: 10/11/2021 19:15   US RENAL  Result Date: 10/12/2021 CLINICAL DATA:  Concern for hydronephrosis. EXAM: RENAL / URINARY TRACT ULTRASOUND COMPLETE COMPARISON:  Renal ultrasound dated 09/12/2021. FINDINGS: Right Kidney: Renal measurements: 8.6 x 3.3 x 4.1 cm = volume: 61 mL. Echogenicity within normal limits. A renal cyst measures 2.5 x 2.8 x 1.9 cm. No solid mass or hydronephrosis visualized. Left Kidney: Renal measurements: 8.2 x 4.5 x 4.5 cm = volume: 87 mL. Echogenicity within normal limits. No mass or hydronephrosis visualized. Bladder: Appears normal for degree of bladder distention. Other: The images are suboptimal due to rapid breathing and inability to perform breath hold during the exam. IMPRESSION: No evidence of hydronephrosis. Electronically Signed   By: Zerita Boers M.D.   On: 10/12/2021 15:28   DG CHEST PORT 1 VIEW  Result Date: 10/12/2021 CLINICAL DATA:  Left pleural effusion.  History of malignancy. EXAM: PORTABLE CHEST 1 VIEW COMPARISON:  Chest radiograph 08/11/2021 FINDINGS: Moderate left and small right pleural effusion, slightly increased compared to yesterday's exam. Opacification of the left lower lobe secondary to pleural fluid left lower lobe atelectasis. Right basilar atelectasis also noted. Diffuse hazy interstitial thickening, consistent with pulmonary edema. No pneumothorax. Unable to assess heart size due to adjacent consolidations. Aortic calcifications. No acute osseous abnormality. IMPRESSION: Bilateral pleural effusions, moderate left and small right, mildly increased compared to yesterday's exam. Bibasilar compressive atelectasis. Mild pulmonary edema. Aortic Atherosclerosis (ICD10-I70.0). Electronically Signed   By: Ileana Roup M.D.   On: 10/12/2021 10:57   US Abdomen Limited RUQ (LIVER/GB)  Result  Date: 10/12/2021 CLINICAL DATA:  Cirrhosis. EXAM: ULTRASOUND ABDOMEN LIMITED RIGHT UPPER QUADRANT COMPARISON:  Renal ultrasound dated 09/12/2021. FINDINGS: Gallbladder: No gallstones or wall thickening visualized. No sonographic Murphy sign noted by sonographer. Common bile duct: Diameter: 4 mm Liver: No focal lesion identified. Increased parenchymal echogenicity. Portal vein is patent on color Doppler imaging with normal direction of blood flow towards the liver. Other: A right pleural effusion is noted. Images are suboptimal due to fast breathing by the patient and inability to perform breath hold during the exam. IMPRESSION: 1. Increased liver echogenicity likely reflects hepatic steatosis. No focal hepatic lesion is identified. 2.  Right pleural effusion. Electronically Signed   By: Zerita Boers M.D.   On: 10/12/2021 15:30   US THORACENTESIS ASP PLEURAL SPACE W/IMG GUIDE  Result Date: 10/12/2021 INDICATION: Chronic kidney disease with recurrent left pleural effusion. Request for therapeutic thoracentesis. EXAM: ULTRASOUND GUIDED LEFT THORACENTESIS MEDICATIONS: 1% lidocaine 8 mL COMPLICATIONS: None immediate. PROCEDURE: An ultrasound guided thoracentesis was thoroughly discussed with the patient and questions answered. The benefits, risks, alternatives and complications were also discussed. The patient understands and wishes to proceed with the procedure. Written consent was obtained. Ultrasound was performed to localize and mark an adequate pocket of fluid in the left chest. The area was then prepped and draped in the normal sterile fashion. 1% Lidocaine was used for local anesthesia. Under ultrasound guidance a 6 Fr Safe-T-Centesis catheter was introduced. Thoracentesis was performed. The catheter was removed and a dressing applied. FINDINGS: A total of approximately 1.2  L of red tinged fluid was removed. IMPRESSION: Successful ultrasound guided left thoracentesis yielding 1.2 L of pleural fluid. Read  by: Gareth Eagle, PA-C Electronically Signed   By: Ruthann Cancer M.D.   On: 10/12/2021 14:48     Scheduled Meds:    aspirin EC  81 mg Oral Daily   atorvastatin  20 mg Oral Daily   Chlorhexidine Gluconate Cloth  6 each Topical Daily   famotidine  20 mg Oral Daily   hydrocortisone sod succinate (SOLU-CORTEF) inj  50 mg Intravenous Q12H   mouth rinse  15 mL Mouth Rinse BID    Continuous Infusions:    sodium chloride Stopped (10/10/21 1902)   sodium chloride Stopped (10/10/21 2039)   heparin 850 Units/hr (10/13/21 0849)     LOS: 3 days     Vernell Leep, MD, FACP, [facility]    To contact the attending provider between 7A-7P or the covering provider during after hours 7P-7A, please log into the web site [email or web address] and access using universal [place] password for that web site. If you do not have the password, please call the hospital operator.  10/13/2021, 9:05 AM

## 2021-10-13 NOTE — Progress Notes (Addendum)
Progress Note  Patient Name: Jillian Cooley Date of Encounter: 10/13/2021  Primary Cardiologist: Werner Lean, MD  Subjective   Feeling well. Breathing much better. Slept better last night. Not SOB with recumbency. Family at bedside as well.  Inpatient Medications    Scheduled Meds:  aspirin EC  81 mg Oral Daily   atorvastatin  20 mg Oral Daily   Chlorhexidine Gluconate Cloth  6 each Topical Daily   famotidine  20 mg Oral Daily   hydrocortisone sod succinate (SOLU-CORTEF) inj  50 mg Intravenous Q12H   mouth rinse  15 mL Mouth Rinse BID   Continuous Infusions:  sodium chloride Stopped (10/10/21 1902)   sodium chloride Stopped (10/10/21 2039)   heparin 850 Units/hr (10/13/21 0849)   PRN Meds: sodium chloride, docusate sodium, polyethylene glycol   Vital Signs    Vitals:   10/13/21 0600 10/13/21 0700 10/13/21 0715 10/13/21 0800  BP: (!) 109/40 123/68    Pulse: (!) 45 94 93   Resp: _0 Temp:    97.9 F (36.6 C)  TempSrc:    Oral  SpO2: 100% 97% 98%   Weight:      Height:        Intake/Output Summary (Last 24 hours) at 10/13/2021 1001 Last data filed at 10/13/2021 0715 Gross per 24 hour  Intake 407.11 ml  Output 600 ml  Net -192.89 ml   Last 3 Weights 10/12/2021 10/11/2021 10/10/2021  Weight (lbs) 122 lb 9.2 oz 123 lb 10.9 oz 118 lb 13.3 oz  Weight (kg) 55.6 kg 56.1 kg 53.9 kg     Telemetry    NSR with intermittent brief pauses related primarily to nonconducted PACs, also very brief Mobitz 1 AVB  - Personally Reviewed  Physical Exam   GEN: No acute distress.  HEENT: Normocephalic, atraumatic, sclera non-icteric. Neck: No JVD or bruits. Cardiac: RRR no murmurs, rubs, or gallops.  Respiratory: Decreased BS at bases bilaterally. Breathing is unlabored. GI: Soft, nontender, non-distended, BS +x 4. MS: no deformity. Extremities: No clubbing or cyanosis. No edema. Distal pedal pulses are 2+ and equal bilaterally. Neuro:  AAOx3.  Follows commands. Psych:  Responds to questions appropriately with a normal affect.  Labs    High Sensitivity Troponin:   Recent Labs  Lab 10/10/21 0731 10/10/21 0935 10/10/21 1548 10/10/21 1822 10/12/21 0252  TROPONINIHS 523* 836* 3,307* 5,378* 5,247*      Cardiac EnzymesNo results for input(s): TROPONINI in the last 168 hours. No results for input(s): TROPIPOC in the last 168 hours.   Chemistry Recent Labs  Lab 10/10/21 0731 10/10/21 1548 10/11/21 0333 10/12/21 0252 10/13/21 0258  NA 136 138 138 138 138  K 4.6 4.2 4.1 3.8 3.3*  CL 103 107 104 104 104  CO2 19* 18* _1 GLUCOSE 322* 105* 152* 133* 115*  BUN 74* 73* 79* 83* 88*  CREATININE 2.43* 1.93* 2.53* 2.80* 2.48*  CALCIUM 9.6 9.0 8.9 8.9 8.7*  PROT 6.9 6.0*  --  5.8*  --   ALBUMIN 3.8 3.4*  --  3.2*  --   AST 33 32  --  16  --   ALT 34 30  --  25  --   ALKPHOS 56 45  --  40  --   BILITOT 0.7 0.8  --  0.7  --   GFRNONAA 19* 25* 18* 16* 18*  ANIONGAP _2 Hematology Recent Labs  Lab 10/11/21 0333 10/12/21 0252 10/13/21 0258  WBC 9.9 8.8 7.5  RBC 3.53* 3.39* 3.40*  HGB 10.4* 10.0* 9.9*  HCT 32.7* 31.6* 31.6*  MCV 92.6 93.2 92.9  MCH 29.5 29.5 29.1  MCHC 31.8 31.6 31.3  RDW 15.0 15.1 15.0  PLT 213 194 191    BNP Recent Labs  Lab 10/10/21 0731  BNP 1,070.9*     DDimer No results for input(s): DDIMER in the last 168 hours.   Radiology    DG Chest 1 View  Result Date: 10/12/2021 CLINICAL DATA:  Post left-sided thoracentesis. EXAM: CHEST  1 VIEW COMPARISON:  Radiographs 10/12/2021 and 10/11/2021. FINDINGS: 1255 hours. Interval significant decrease in size of previously demonstrated left pleural effusion with improved aeration of the left lung base. Small right pleural effusion and right basilar atelectasis remain. The heart size and mediastinal contours are stable with aortic atherosclerosis. No evidence of pneumothorax. The bones appear unchanged status post lower cervical  fusion. Telemetry leads overlie the chest. IMPRESSION: Interval improved left pleural effusion and left basilar aeration following thoracentesis. No pneumothorax. Electronically Signed   By: Richardean Sale M.D.   On: 10/12/2021 13:03   DG Chest 1 View  Result Date: 10/11/2021 CLINICAL DATA:  Dyspnea and wheezing. EXAM: CHEST  1 VIEW COMPARISON:  Radiograph earlier today, additional priors reviewed. FINDINGS: Bilateral pleural effusions, left greater than right, at least moderate in size. No significant change from earlier today. Associated bibasilar opacities. Interstitial edema with slight improvement. Heart is likely enlarged, but obscured by pleural effusions on the current exam. No pneumothorax. IMPRESSION: 1. Unchanged bilateral pleural effusions, left greater than right, at least moderate in size. 2. Interstitial edema with slight improvement from earlier today. Electronically Signed   By: Keith Rake M.D.   On: 10/11/2021 19:15   US RENAL  Result Date: 10/12/2021 CLINICAL DATA:  Concern for hydronephrosis. EXAM: RENAL / URINARY TRACT ULTRASOUND COMPLETE COMPARISON:  Renal ultrasound dated 09/12/2021. FINDINGS: Right Kidney: Renal measurements: 8.6 x 3.3 x 4.1 cm = volume: 61 mL. Echogenicity within normal limits. A renal cyst measures 2.5 x 2.8 x 1.9 cm. No solid mass or hydronephrosis visualized. Left Kidney: Renal measurements: 8.2 x 4.5 x 4.5 cm = volume: 87 mL. Echogenicity within normal limits. No mass or hydronephrosis visualized. Bladder: Appears normal for degree of bladder distention. Other: The images are suboptimal due to rapid breathing and inability to perform breath hold during the exam. IMPRESSION: No evidence of hydronephrosis. Electronically Signed   By: Zerita Boers M.D.   On: 10/12/2021 15:28   DG CHEST PORT 1 VIEW  Result Date: 10/12/2021 CLINICAL DATA:  Left pleural effusion.  History of malignancy. EXAM: PORTABLE CHEST 1 VIEW COMPARISON:  Chest radiograph 08/11/2021  FINDINGS: Moderate left and small right pleural effusion, slightly increased compared to yesterday's exam. Opacification of the left lower lobe secondary to pleural fluid left lower lobe atelectasis. Right basilar atelectasis also noted. Diffuse hazy interstitial thickening, consistent with pulmonary edema. No pneumothorax. Unable to assess heart size due to adjacent consolidations. Aortic calcifications. No acute osseous abnormality. IMPRESSION: Bilateral pleural effusions, moderate left and small right, mildly increased compared to yesterday's exam. Bibasilar compressive atelectasis. Mild pulmonary edema. Aortic Atherosclerosis (ICD10-I70.0). Electronically Signed   By: Ileana Roup M.D.   On: 10/12/2021 10:57   US Abdomen Limited RUQ (LIVER/GB)  Result Date: 10/12/2021 CLINICAL DATA:  Cirrhosis. EXAM: ULTRASOUND ABDOMEN LIMITED RIGHT UPPER QUADRANT COMPARISON:  Renal ultrasound dated 09/12/2021. FINDINGS: Gallbladder: No gallstones or  wall thickening visualized. No sonographic Murphy sign noted by sonographer. Common bile duct: Diameter: 4 mm Liver: No focal lesion identified. Increased parenchymal echogenicity. Portal vein is patent on color Doppler imaging with normal direction of blood flow towards the liver. Other: A right pleural effusion is noted. Images are suboptimal due to fast breathing by the patient and inability to perform breath hold during the exam. IMPRESSION: 1. Increased liver echogenicity likely reflects hepatic steatosis. No focal hepatic lesion is identified. 2.  Right pleural effusion. Electronically Signed   By: Zerita Boers M.D.   On: 10/12/2021 15:30   US THORACENTESIS ASP PLEURAL SPACE W/IMG GUIDE  Result Date: 10/12/2021 INDICATION: Chronic kidney disease with recurrent left pleural effusion. Request for therapeutic thoracentesis. EXAM: ULTRASOUND GUIDED LEFT THORACENTESIS MEDICATIONS: 1% lidocaine 8 mL COMPLICATIONS: None immediate. PROCEDURE: An ultrasound guided  thoracentesis was thoroughly discussed with the patient and questions answered. The benefits, risks, alternatives and complications were also discussed. The patient understands and wishes to proceed with the procedure. Written consent was obtained. Ultrasound was performed to localize and mark an adequate pocket of fluid in the left chest. The area was then prepped and draped in the normal sterile fashion. 1% Lidocaine was used for local anesthesia. Under ultrasound guidance a 6 Fr Safe-T-Centesis catheter was introduced. Thoracentesis was performed. The catheter was removed and a dressing applied. FINDINGS: A total of approximately 1.2 L of red tinged fluid was removed. IMPRESSION: Successful ultrasound guided left thoracentesis yielding 1.2 L of pleural fluid. Read by: Gareth Eagle, PA-C Electronically Signed   By: Ruthann Cancer M.D.   On: 10/12/2021 14:48    Cardiac Studies   2D echo 10/10/21   1. Normal LV function; mild AI; severe MR; elevated mean gradient across  MV of 5 mmHg likely at least partially related to severe MR; moderate TR.   2. Left ventricular ejection fraction, by estimation, is 60 to 65%. The  left ventricle has normal function. The left ventricle has no regional  wall motion abnormalities. Left ventricular diastolic parameters are  indeterminate.   3. Right ventricular systolic function is normal. The right ventricular  size is normal. There is moderately elevated pulmonary artery systolic  pressure.   4. Left atrial size was moderately dilated.   5. Large pleural effusion in the left lateral region.   6. The mitral valve is abnormal. Severe mitral valve regurgitation. Mild  mitral stenosis.   7. Tricuspid valve regurgitation is moderate.   8. The aortic valve has an indeterminant number of cusps. Aortic valve  regurgitation is mild. No aortic stenosis is present.   9. The inferior vena cava is normal in size with <50% respiratory  variability, suggesting right atrial  pressure of 8 mmHg.   Patient Profile     87 y.o. female with HTN, HLD, valvular heart disease (mod MR and mild AI 04/2021), CKD stage 4, carpal tunnel, bilateral DVT/PE, pleural effusion s/p thoracentesis, multiple myeloma. Underwent rotator cuff surgery 01/6332 complicated by hypoxia with initial diagnosis of pleural effusion s/p thoracentesis - given continued hypoxia, was found to have bilateral PE/DVT, discharged on O2. She was also recently diagnosed with multiple myeloma and not felt to be a transplant candidate; planned for chemotherapy. She was admitted 10/10/2021 with worsening dyspnea/chest tightness found to be hypoxic despite 4L and required BiPAP. She had lactic acidosis and hypotension, treated with IVF and stress dose steroids. She was found to have markedly increased large left pleural effusion and small right pleural effusion,  AKI on CKD stage 3b, elevated troponin, and severe mitral regurgitation. 2D echo 10/21 EF 60-65%, mild AI, severe MR, moderate TR. She underwent L sided thoracentesis on 10/21 (1.5L) and repeat L sided thoracentesis on 10/23 (1.2L). Cytology pending, listed as collected from 10/21.  Assessment & Plan    1. Acute on chronic hypoxic respiratory failure - initially placed on O2 after 04/2021 admission for pleural effusion + PEs - required BiPAP on admission - suspect multifactorial due to large recurrent pleural effusion and chronic diastolic CHF  2. Recurrent left pleural effusion - thoracentesis 04/2021, 10/10/21, 10/12/21 - cytology pending - felt to be transudative - medical workup under way by pulmonology  3. Acute on chronic diastolic CHF, severe mitral regurgitation, pulmonary HTN - complicated picture with recurrent pleural effusion as well - last dose of IV Lasix was yesterday - s/p thoracenteses as above - plan RHC today as previously planned  4. NSTEMI - hsTroponin peaked at [place] - suspected to be a component of demand ischemia but given large  rise, cannot exclude underlying CAD - her renal dysfunction and multiple myeloma limit cath consideration at this time so plan medical therapy - 2D echo with normal EF, no WMA - continue heparin drip - while off Eliquis, plan is for ASA 41m daily with d/c of ASA when back on Eliquis - started on atorvastatin this admission - can f/u labs as OP - avoid BB with recent AV block and hypotension  5. Brief pauses - primarily due to nonconducted PACs, also brief second degree type 1 AV block seen - no significant pauses or bradycardia seen - avoid beta blocker - add TSH to labs - continue to monitor on telemetry  6. HTN with episodic hypotension here - responded to IV fluids earlier this admission and also treated with stress dose steroids by primary team  7. Recent AKI superimposed on CKD stage 4 - prior baseline Cr around 2.2 recently, peak 2.8 yesterday, improving to 2.48 today - hypokalemia this AM repleted conservatively by IM  8. Hx bilateral DVT/PEs 04/2021 - now on IV heparin at this time pending completion of all procedures   For questions or updates, please contact CFrontonPlease consult [email or web address] for contact info under Cardiology/STEMI.  Signed, DCharlie Pitter PA-C 10/13/2021, 10:01 AM    Patient seen with PA, agree with the above note.   RHC done today:  RHC Procedural Findings: Hemodynamics (mmHg) RA mean 11 RV 66/9 PA 74/29, mean 49 PCWP mean 25, prominent V-waves to 37 Oxygen saturations: PA 62% AO 100% Cardiac Output (Fick) 4.1  Cardiac Index (Fick) 2.6 PVR 5.8 WU  She reports exertional dyspnea. Creatinine down to 2.4.   General: NAD Neck: JVP 10-12 cm, no thyromegaly or thyroid nodule.  Lungs: Decreased at bases.  CV: Nondisplaced PMI.  Heart regular S1/S2, no S3/S4, no murmur.  No peripheral edema.   Abdomen: Soft, nontender, no hepatosplenomegaly, no distention.  Skin: Intact without lesions or rashes.  Neurologic: Alert and oriented x 3.   Psych: Normal affect. Extremities: No clubbing or cyanosis.  HEENT: Normal.   RHC done today suggests elevated filling pressures with mixed pulmonary arterial/pulmonary venous hypertension.  Diastolic CHF, ?component of chronic thromboembolic PH given bilateral PEs earlier this year.  Creatinine trending down.  Cannot rule out cardiac AL amyloidosis given multiple myeloma though she does not have significant LVH on echo.  - Lasix 60 mg IV bid, will give dose this evening.  Replace K. Follow BMET closely.  -  Consider repeat V/Q scan in the future to assess for evidence for chronic thromboemboli in pulmonary arteries.   She has significant MR by echo with large V-waves on RHC.  Would need TEE in future to further assess if Mitraclip were to be considered in this situation.    Telemetry shows type 1 2nd degree AVB.  Not on nodal blockade.   Plan to return this afternoon to [facility].   Loralie Champagne 10/13/2021 3:04 PM

## 2021-10-13 NOTE — Progress Notes (Signed)
ANTICOAGULATION CONSULT NOTE - follow up  Pharmacy Consult for Heparin Indication: chest pain/ACS  Allergies  Allergen Reactions   Sulfa Antibiotics Nausea Only    Patient Measurements: Height: 5\' 3"  (160 cm) Weight: 55.6 kg (122 lb 9.2 oz) IBW/kg (Calculated) : 52.4 Heparin Dosing Weight: TBW  Vital Signs: Temp: 97.4 F (36.3 C) (10/23 2346) Temp Source: Axillary (10/23 2346) BP: 133/60 (10/24 0200) Pulse Rate: 90 (10/24 0200)  Labs: Recent Labs    10/10/21 0731 10/10/21 0935 10/10/21 1548 10/10/21 1822 10/10/21 1920 10/11/21 0333 10/11/21 1246 10/12/21 0252 10/13/21 0258  HGB 11.5*  --  10.6*  --   --  10.4*  --  10.0* 9.9*  HCT 37.4  --  34.2*  --   --  32.7*  --  31.6* 31.6*  PLT 311  --  218  --   --  213  --  194 191  APTT  --    < >  --   --    < > 78* 70* 69* 47*  LABPROT 19.7*  --   --   --   --   --   --   --   --   INR 1.7*  --   --   --   --   --   --   --   --   HEPARINUNFRC  --   --   --   --    < > >1.10*  --  >1.10* 1.04*  CREATININE 2.43*  --  1.93*  --   --  2.53*  --  2.80* 2.48*  TROPONINIHS 523*   < > 3,307* 5,378*  --   --   --  5,247*  --    < > = values in this interval not displayed.     Estimated Creatinine Clearance: 13.2 mL/min (A) (by C-G formula based on SCr of 2.48 mg/dL (H)).   Medical History: Past Medical History:  Diagnosis Date   Abnormal EKG    Carpal tunnel syndrome    CKD (chronic kidney disease), stage III (HCC)    DDD (degenerative disc disease), lumbar    Hypertension    Hypertension with renal disease    Menopause    Mixed hyperlipidemia    Muscular degeneration    Nonrheumatic aortic (valve) insufficiency    Nonrheumatic mitral valve regurgitation    Osteoarthritis    Pedal edema    Pleural effusion    Skin cancer    Vitamin D deficiency     Medications:  Scheduled:   aspirin EC  81 mg Oral Daily   atorvastatin  20 mg Oral Daily   Chlorhexidine Gluconate Cloth  6 each Topical Daily   famotidine   20 mg Oral Daily   hydrocortisone sod succinate (SOLU-CORTEF) inj  50 mg Intravenous Q12H   mouth rinse  15 mL Mouth Rinse BID   Infusions:   sodium chloride Stopped (10/10/21 1902)   sodium chloride Stopped (10/10/21 2039)   heparin 750 Units/hr (10/13/21 0052)    Assessment: 78 yoF presented to ED with ShOB.  Heparin per pharmacy for ACS.  PTA Eliquis held, last dose 10/20 at 18:00 Troponin: 523, 836 >> 5378 CBC:  Hgb 11.5, Plt WNL Baseline aPTT 29 seconds, INR 1.7  10/13/21 PM: -aPTT sub therapeutic at 47 seconds, HL 1.04 - Hgb 9.9. Plts WNL - Scr 2.48 -No issues with line or bleeding per RN  Goal of Therapy:  Heparin level 0.3-0.7 units/ml aPTT  66-102 seconds Monitor platelets by anticoagulation protocol: Yes   Plan:  Increase heparin drip to 850 units/hr aPTT/HL in 8 hours Daily aPTT/heparin level and CBC Continue to monitor H&H and platelets  Dolly Rias RPh 10/13/2021, 4:18 AM

## 2021-10-13 NOTE — Progress Notes (Signed)
[facility] at bedside, pt to transfer back to [facility], no c/o pain at this time, safety maintained

## 2021-10-13 NOTE — Progress Notes (Signed)
Nurse was notified that patient had gone into a second degree block that was captured at that time by the tele tech. Patient then started developing bradycardia as low as 42 lasting 10 beats before going back into the 80's-90's. This happened 3-4 times over a period of 5 minutes, provider was notified, will try to capture on ekg.

## 2021-10-13 NOTE — Progress Notes (Signed)
NAME:  Jillian Cooley, MRN:  [id number], DOB:  08-24-1932, LOS: 3 ADMISSION DATE:  10/10/2021, CONSULTATION DATE:  10/10/2021 REFERRING MD:  Almyra Free - EDP CHIEF COMPLAINT: Respiratory failure, large left pleural effusion, metabolic acidosis   History of Present Illness:  87 year old woman who presented to [facility] ED 10/21 for SOB/dyspnea and hypoxia. 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  Patient was scheduled for chemotherapy 10/21 but was found to be hypoxic to 80s (per husband) despite 4L [place]. She required BiPAP. Blood pressures were soft and labs revealed metabolic acidosis with pH 7.1 and LA 4.0. Additionally, patient was noted to have mild AKI on CKD and elevated BNP to 1000. Troponins peaked at 5300, c/f NSTEMI (type II). CXR demonstrated large L pleural effusion.  PCCM called for assistance with management of critical illness and recurrent transudative L pleural effusion.  Pertinent Medical History:   Past Medical History:  Diagnosis Date   Abnormal EKG    Carpal tunnel syndrome    CKD (chronic kidney disease), stage III (HCC)    DDD (degenerative disc disease), lumbar    Hypertension    Hypertension with renal disease    Menopause    Mixed hyperlipidemia    Muscular degeneration    Nonrheumatic aortic (valve) insufficiency    Nonrheumatic mitral valve regurgitation    Osteoarthritis    Pedal edema    Pleural effusion    Skin cancer    Vitamin D deficiency    Significant Hospital Events: Including procedures, antibiotic start and stop dates in addition to other pertinent events   10/21 - Admitted via [facility] ED. On BiPAP. Soft BP, LA 4 with pH 7.1, s/p 1.5L thoracentesis (transudate with LDH 64, cyto pending). SDS, fluids for soft BPs.  IV heparin for NSTEMI. 10/22 - seen by  cardiology.  Demand ischemia suspected troponin peaked at 5000.  Currently on 4 L oxygen and off BiPAP.  Feeling better after thoracentesis.  Chest x-ray with reduced but residual left pleural effusion.  Currently on bicarbonate infusion and heparin infusion.  Not on pressors. AFebirle an cultur enegative. BP improved 100-119 sbp.,. and dias 50s. 10/24 - RHC today, elevated R and L heart filling pressures, significant MR, normal CO, mixed pulmonary venous/pulmonary arterial HTN.  Interim History / Subjective:  Feeling poorly overall RHC today  Objective:  Blood pressure 123/68, pulse 93, temperature 97.9 F (36.6 C), temperature source Oral, resp. rate 15, height $RemoveBe'5\' 3"'KkPznmIic$  (1.6 m), weight 55.6 kg, SpO2 98 %.      Estimated body mass index is 21.71 kg/m as calculated from the following:   Height as of this encounter: $RemoveBeforeD'5\' 3"'xuLGZfYtiddtrR$  (1.6 m).   Weight as of this encounter: 55.6 kg.   Intake/Output Summary (Last 24 hours) at 10/13/2021 1020 Last data filed at 10/13/2021 0715 Gross per 24 hour  Intake 407.11 ml  Output 600 ml  Net -192.89 ml    Filed Weights   10/10/21 1810 10/11/21 0343 10/12/21 0500  Weight: 53.9 kg 56.1 kg 55.6 kg   Physical Examination: General: Chronically ill-appearing elderly woman in NAD. Frail. HEENT: [place]/AT, anicteric sclera, PERRL, moist mucous membranes. Neuro: Awake, oriented x 4. Responds to verbal stimuli. Following commands consistently. Moves all 4 extremities spontaneously.  CV: RRR, no m/g/r. PULM: Breathing even and unlabored on 2L . Lung fields diminished at bilateral bases, L >  R. GI: Soft, nontender, nondistended. Normoactive bowel sounds. Extremities: No LE edema noted. Skin: Warm/dry, no rashes.  Resolved Hospital Problem List:   Severe lactic acidosis and metabolic acidosis at admission.?  Due to cardiac versus renal injury for myeloma versus sepsis  Assessment & Plan:   Chronic recurrent left pleural effusion - transudative (February 2022, May  2022, September 2022) S/p 1.5L [place] with dyspnea improvement (transudate). - Follow CXR - F/u pending thoracentesis fluid studies - Assess volume status daily - Continue diuresis  Acute-on-chronic hypoxemic respiratory failure On 2LNC at home, requiring BiPAP on admission for worsened dyspnea, hypoxia, SOB - Continue supplemental O2 support - Wean FiO2 for O2 sat > 90% - Pulmonary hygiene - Diuresis as above  History of DVT/PE in May 2022 Prior to admission, currently on Eliquis. Duplex LE 10/10/21 - negative for DVT. - Continue heparin gtt for NSTEMI - Transition to Eliquis at discharge  Acute-on-chronic diastolic dysfunction and severe mitral regurgitation with severe elevated pulmonary artery pressures and left atrial dilatation Grade 2 diastolic dysfunction with severe elevation pulmonary artery pressures - May 2022 Type II NSTEMI Demand ischemia per Cards. Echo on admission with severe increased PASP and severe MR. Peak troponins 5300.  - Cardiology consulted, appreciate input - Continue heparin gtt - S/p RHC 10/24 - F/u Pulmonary HTN workup - F/u HIV, ANA, DS-DNA, SSA, SSB, SCl-70  Acute kidney injury on CKD IV Renal US without evidence of hydronephrosis. - Trend BMP - Replete electrolytes as indicated - Monitor I&Os - F/u urine studies - Avoid nephrotoxic agents as able - Ensure adequate renal perfusion - F/u ANCA, GBM (pending)  Multiple myeloma  Untreated. Due to start chemotherapy 10/10/2021.  Sees [name]. - Unable to tolerate chemo, given critical illness at present - Likely that cardiac/overall functional status must improve before chemo can be reconsidered - Oncology consulted - Likely poor prognosis  Failure to thrive - Prior to & Present on Admit (admit to weight loss prior to admit and low appetite x 1 mo) Mild protein calorie malnutrition - Prior to & Present on Admit - Nutrition following, appreciate recommendations - Encourage  mobility  [employer] Per Dr. Golden Pop note 10/23: "Last date of multidisciplinary goals of care discussion with ER RN, daughter and patient 10/10/2021. Patient has a living will where she does not want to be resuscitated but only kept comfortable in the case of incurable or terminal illness or persistent vegetative state.  I saw this document.  However the current condition is that she is critically ill.  She is opted for full medical care and initially said not to intubate but after further reflection she wants to be intubated for the short-term and also have CPR.  She wants to reflect on all this.  Her decline is currently a little bit of an emotional shock to her." - Patient will need ongoing [place] discussion in the setting of clinical decline/poor prognosis - Recommend PMT consult for assistance with managing GOC  Best Practice (right click and "Reselect all SmartList Selections" daily)   Diet/type: NPO w/ oral meds DVT prophylaxis: systemic heparin GI prophylaxis: H2B Lines: N/A Foley:  N/A Code Status:  full code  Critical care time: N/A   Rhae Lerner [facility] 10/13/21 1:45 PM  Please see [email or web address] for pager details.  From 7A-7P if no response, please call [phone] After hours, please call ELink [phone]

## 2021-10-13 NOTE — Progress Notes (Signed)
Received pt from [facility] via [facility] alert and oriented X4 , skin warm and dry, O2 ar 3 liters and Heparin infusing. Pt on monitor and denies any chest pain  or discomfort at this time.  Consent signed, and waiting for procedure.

## 2021-10-13 NOTE — Interval H&P Note (Signed)
History and Physical Interval Note:  10/13/2021 2:13 PM  Jillian Cooley  has presented today for surgery, with the diagnosis of hf.  The various methods of treatment have been discussed with the patient and family. After consideration of risks, benefits and other options for treatment, the patient has consented to  Procedure(s): RIGHT HEART CATH (N/A) as a surgical intervention.  [name] history has been reviewed, patient examined, no change in status, stable for surgery.  I have reviewed the patient's chart and labs.  Questions were answered to the patient's satisfaction.     Jevin Camino [name]

## 2021-10-13 NOTE — Progress Notes (Signed)
ANTICOAGULATION CONSULT NOTE - follow up  Pharmacy Consult for Heparin Indication: chest pain/ACS  Allergies  Allergen Reactions   Sulfa Antibiotics Nausea Only    Patient Measurements: Height: 5\' 3"  (160 cm) Weight: 55.6 kg (122 lb 9.2 oz) IBW/kg (Calculated) : 52.4 Heparin Dosing Weight: TBW  Vital Signs: Temp: 97.6 F (36.4 C) (10/24 1200) Temp Source: Oral (10/24 1200) BP: 122/45 (10/24 1520) Pulse Rate: 93 (10/24 1525)  Labs: Recent Labs    10/10/21 1822 10/10/21 1920 10/11/21 0333 10/11/21 1246 10/12/21 0252 10/13/21 0258  HGB  --    < > 10.4*  --  10.0* 9.9*  HCT  --   --  32.7*  --  31.6* 31.6*  PLT  --   --  213  --  194 191  APTT  --    < > 78* 70* 69* 47*  HEPARINUNFRC  --    < > >1.10*  --  >1.10* 1.04*  CREATININE  --   --  2.53*  --  2.80* 2.48*  TROPONINIHS 5,378*  --   --   --  5,247*  --    < > = values in this interval not displayed.     Estimated Creatinine Clearance: 13.2 mL/min (A) (by C-G formula based on SCr of 2.48 mg/dL (H)).   Medical History: Past Medical History:  Diagnosis Date   Abnormal EKG    Carpal tunnel syndrome    CKD (chronic kidney disease), stage III (HCC)    DDD (degenerative disc disease), lumbar    Hypertension    Hypertension with renal disease    Menopause    Mixed hyperlipidemia    Muscular degeneration    Nonrheumatic aortic (valve) insufficiency    Nonrheumatic mitral valve regurgitation    Osteoarthritis    Pedal edema    Pleural effusion    Skin cancer    Vitamin D deficiency     Medications:  Scheduled:   aspirin EC  81 mg Oral Daily   atorvastatin  20 mg Oral Daily   Chlorhexidine Gluconate Cloth  6 each Topical Daily   famotidine  20 mg Oral Daily   furosemide  60 mg Intravenous BID   hydrocortisone sod succinate (SOLU-CORTEF) inj  50 mg Intravenous Q12H   mouth rinse  15 mL Mouth Rinse BID   potassium chloride  40 mEq Oral Once   sodium chloride flush  3 mL Intravenous Q12H   Infusions:    sodium chloride Stopped (10/10/21 1902)   sodium chloride Stopped (10/10/21 2039)   heparin      Jillian Cooley: 36 yoF presented to ED with ShOB.  Heparin per pharmacy for ACS, hx PE/DVT.  PTA Eliquis held, last dose 10/20 at 18:00 Troponin: 523, 836 >> 5378 CBC:  Hgb 11.5, Plt WNL Baseline aPTT 29 seconds, INR 1.7  Heparin turned off for RHC, pharmacy asked to resume now post-procedure.  Goal of Therapy:  Heparin level 0.3-0.7 units/ml aPTT 66-102 seconds Monitor platelets by anticoagulation protocol: Yes   Plan:  Resume heparin at 850 units/hr. aPTT/HL in 8 hours Daily aPTT/heparin level and CBC Continue to monitor H&H and platelets  [place] Crane, Vena Austria, BCPS, BCCP Clinical Pharmacist  10/13/2021 4:10 PM   [facility] pharmacy phone numbers are listed on [email or web address]

## 2021-10-14 ENCOUNTER — Encounter (HOSPITAL_COMMUNITY): Payer: Self-pay | Admitting: Cardiology

## 2021-10-14 DIAGNOSIS — N179 Acute kidney failure, unspecified: Secondary | ICD-10-CM | POA: Diagnosis not present

## 2021-10-14 DIAGNOSIS — I2721 Secondary pulmonary arterial hypertension: Secondary | ICD-10-CM | POA: Diagnosis not present

## 2021-10-14 DIAGNOSIS — I340 Nonrheumatic mitral (valve) insufficiency: Secondary | ICD-10-CM | POA: Diagnosis not present

## 2021-10-14 DIAGNOSIS — J90 Pleural effusion, not elsewhere classified: Secondary | ICD-10-CM | POA: Diagnosis not present

## 2021-10-14 DIAGNOSIS — N184 Chronic kidney disease, stage 4 (severe): Secondary | ICD-10-CM

## 2021-10-14 DIAGNOSIS — J9601 Acute respiratory failure with hypoxia: Secondary | ICD-10-CM | POA: Diagnosis not present

## 2021-10-14 DIAGNOSIS — I214 Non-ST elevation (NSTEMI) myocardial infarction: Secondary | ICD-10-CM | POA: Diagnosis present

## 2021-10-14 DIAGNOSIS — J9621 Acute and chronic respiratory failure with hypoxia: Secondary | ICD-10-CM | POA: Diagnosis not present

## 2021-10-14 DIAGNOSIS — I5033 Acute on chronic diastolic (congestive) heart failure: Secondary | ICD-10-CM | POA: Diagnosis not present

## 2021-10-14 LAB — BODY FLUID CULTURE W GRAM STAIN
Culture: NO GROWTH
Gram Stain: NONE SEEN

## 2021-10-14 LAB — BASIC METABOLIC PANEL
Anion gap: 10 (ref 5–15)
BUN: 86 mg/dL — ABNORMAL HIGH (ref 8–23)
CO2: 23 mmol/L (ref 22–32)
Calcium: 8.8 mg/dL — ABNORMAL LOW (ref 8.9–10.3)
Chloride: 108 mmol/L (ref 98–111)
Creatinine, Ser: 2.12 mg/dL — ABNORMAL HIGH (ref 0.44–1.00)
GFR, Estimated: 22 mL/min — ABNORMAL LOW (ref >60)
Glucose, Bld: 126 mg/dL — ABNORMAL HIGH (ref 70–99)
Potassium: 4.1 mmol/L (ref 3.5–5.1)
Sodium: 141 mmol/L (ref 135–145)

## 2021-10-14 LAB — CBC
HCT: 32.1 % — ABNORMAL LOW (ref 36.0–46.0)
Hemoglobin: 10.1 g/dL — ABNORMAL LOW (ref 12.0–15.0)
MCH: 29.6 pg (ref 26.0–34.0)
MCHC: 31.5 g/dL (ref 30.0–36.0)
MCV: 94.1 fL (ref 80.0–100.0)
Platelets: 176 10*3/uL (ref 150–400)
RBC: 3.41 MIL/uL — ABNORMAL LOW (ref 3.87–5.11)
RDW: 14.8 % (ref 11.5–15.5)
WBC: 5.8 10*3/uL (ref 4.0–10.5)
nRBC: 0 % (ref 0.0–0.2)

## 2021-10-14 LAB — SJOGRENS SYNDROME-A EXTRACTABLE NUCLEAR ANTIBODY: SSA (Ro) (ENA) Antibody, IgG: <0.2 AI (ref 0.0–0.9)

## 2021-10-14 LAB — APTT
aPTT: 65 seconds — ABNORMAL HIGH (ref 24–36)
aPTT: 64 seconds — ABNORMAL HIGH (ref 24–36)

## 2021-10-14 LAB — MAGNESIUM: Magnesium: 2.3 mg/dL (ref 1.7–2.4)

## 2021-10-14 LAB — ANTINUCLEAR ANTIBODIES, IFA: ANA Ab, IFA: NEGATIVE

## 2021-10-14 LAB — ANTI-DNA ANTIBODY, DOUBLE-STRANDED: ds DNA Ab: 1 IU/mL (ref 0–9)

## 2021-10-14 LAB — SJOGRENS SYNDROME-B EXTRACTABLE NUCLEAR ANTIBODY: SSB (La) (ENA) Antibody, IgG: <0.2 AI (ref 0.0–0.9)

## 2021-10-14 LAB — CYTOLOGY - NON PAP

## 2021-10-14 LAB — HEPARIN LEVEL (UNFRACTIONATED): Heparin Unfractionated: 0.69 IU/mL (ref 0.30–0.70)

## 2021-10-14 LAB — ANTI-SCLERODERMA ANTIBODY: Scleroderma (Scl-70) (ENA) Antibody, IgG: <0.2 AI (ref 0.0–0.9)

## 2021-10-14 LAB — GLOMERULAR BASEMENT MEMBRANE ANTIBODIES: GBM Ab: <0.2 units (ref 0.0–0.9)

## 2021-10-14 LAB — RHEUMATOID FACTOR: Rheumatoid fact SerPl-aCnc: <10 IU/mL (ref <14.0)

## 2021-10-14 MED ORDER — HYDRALAZINE HCL 10 MG PO TABS
10.0000 mg | ORAL_TABLET | Freq: Three times a day (TID) | ORAL | Status: DC
  Administered 2021-10-14 – 2021-10-16 (×6): 10 mg via ORAL
  Filled 2021-10-14 (×6): qty 1

## 2021-10-14 MED ORDER — HYDROCORTISONE SOD SUC (PF) 100 MG IJ SOLR
50.0000 mg | Freq: Every day | INTRAMUSCULAR | Status: DC
  Administered 2021-10-15 – 2021-10-17 (×3): 50 mg via INTRAVENOUS
  Filled 2021-10-14 (×3): qty 2

## 2021-10-14 MED ORDER — HEPARIN (PORCINE) 25000 UT/250ML-% IV SOLN
1000.0000 [IU]/h | INTRAVENOUS | Status: DC
  Administered 2021-10-14: 1000 [IU]/h via INTRAVENOUS
  Filled 2021-10-14: qty 250

## 2021-10-14 NOTE — Progress Notes (Signed)
Progress Note  Patient Name: Jillian Cooley Date of Encounter: 10/14/2021  [facility] Cardiologist: Werner Lean, MD   Subjective   Breathing has improved but she is still orthopneic.  Doubt accuracy of in/out recording, probably since she was being transferred between hospitals yesterday.  Weight is down 4 pounds since yesterday. Creatinine shows improving trend despite diuresis.  Inpatient Medications    Scheduled Meds:  aspirin EC  81 mg Oral Daily   atorvastatin  20 mg Oral Daily   Chlorhexidine Gluconate Cloth  6 each Topical Daily   famotidine  20 mg Oral Daily   furosemide  60 mg Intravenous BID   hydrocortisone sod succinate (SOLU-CORTEF) inj  50 mg Intravenous Q12H   mouth rinse  15 mL Mouth Rinse BID   sodium chloride flush  3 mL Intravenous Q12H   sodium chloride flush  3 mL Intravenous Q12H   Continuous Infusions:  sodium chloride Stopped (10/10/21 1902)   sodium chloride 10 mL/hr at 10/14/21 0400   sodium chloride     heparin 900 Units/hr (10/14/21 0641)   PRN Meds: sodium chloride, sodium chloride, acetaminophen, docusate sodium, ondansetron (ZOFRAN) IV, polyethylene glycol, sodium chloride flush   Vital Signs    Vitals:   10/14/21 0500 10/14/21 0600 10/14/21 0800 10/14/21 0945  BP:  (!) 116/59 138/79 (!) 117/56  Pulse:  85 97 89  Resp:  $Remo'17 14 17  'okpqb$ Temp:   (!) 97.3 F (36.3 C)   TempSrc:      SpO2:  99% 97% 100%  Weight: 53.8 kg     Height:        Intake/Output Summary (Last 24 hours) at 10/14/2021 1146 Last data filed at 10/14/2021 0400 Gross per 24 hour  Intake 283.77 ml  Output 600 ml  Net -316.23 ml   Last 3 Weights 10/14/2021 10/12/2021 10/11/2021  Weight (lbs) 118 lb 9.7 oz 122 lb 9.2 oz 123 lb 10.9 oz  Weight (kg) 53.8 kg 55.6 kg 56.1 kg      Telemetry    Sinus rhythm- Personally Reviewed  ECG    Sinus rhythm with first-degree AV block, generalized low voltage, QS pattern in the anteroseptal leads, no  acute repolarization abnormalities- Personally Reviewed  Physical Exam  Appears elderly and frail GEN: No acute distress.   Neck: Very mild JVD, roughly 5 cm Cardiac: RRR, apical holosystolic murmur, no diastolic murmurs, rubs, or gallops.  Respiratory: Diminished breath sounds and dullness to percussion in both bases GI: Soft, nontender, non-distended  MS: No edema; No deformity. Neuro:  Nonfocal  Psych: Normal affect   Labs    High Sensitivity Troponin:   Recent Labs  Lab 10/10/21 0731 10/10/21 0935 10/10/21 1548 10/10/21 1822 10/12/21 0252  TROPONINIHS 523* 836* 3,307* 5,378* 5,247*     Chemistry Recent Labs  Lab 10/10/21 0731 10/10/21 0731 10/10/21 1548 10/11/21 0333 10/12/21 0252 10/13/21 0258 10/13/21 1437 10/14/21 0456  NA 136  --  138 138 138 138 141  143 141  K 4.6  --  4.2 4.1 3.8 3.3* 3.5  3.3* 4.1  CL 103  --  107 104 104 104  --  108  CO2 19*  --  18* $Re'24 23 23  'IKN$ --  23  GLUCOSE 322*  --  105* 152* 133* 115*  --  126*  BUN 74*  --  73* 79* 83* 88*  --  86*  CREATININE 2.43*  --  1.93* 2.53* 2.80* 2.48*  --  2.12*  CALCIUM 9.6  --  9.0 8.9 8.9 8.7*  --  8.8*  MG  --    < > 2.3 2.2 2.3  --   --  2.3  PROT 6.9  --  6.0*  --  5.8*  --   --   --   ALBUMIN 3.8  --  3.4*  --  3.2*  --   --   --   AST 33  --  32  --  16  --   --   --   ALT 34  --  30  --  25  --   --   --   ALKPHOS 56  --  45  --  40  --   --   --   BILITOT 0.7  --  0.8  --  0.7  --   --   --   GFRNONAA 19*  --  25* 18* 16* 18*  --  22*  ANIONGAP 14  --  $R'13 10 11 11  'tw$ --  10   < > = values in this interval not displayed.    Lipids  Recent Labs  Lab 10/12/21 0252  CHOL 141  TRIG 61  HDL 45  LDLCALC 84  CHOLHDL 3.1    Hematology Recent Labs  Lab 10/12/21 0252 10/13/21 0258 10/13/21 1437 10/14/21 0456  WBC 8.8 7.5  --  5.8  RBC 3.39* 3.40*  --  3.41*  HGB 10.0* 9.9* 10.9*  10.2* 10.1*  HCT 31.6* 31.6* 32.0*  30.0* 32.1*  MCV 93.2 92.9  --  94.1  MCH 29.5 29.1  --  29.6   MCHC 31.6 31.3  --  31.5  RDW 15.1 15.0  --  14.8  PLT 194 191  --  176   Thyroid No results for input(s): TSH, FREET4 in the last 168 hours.  BNP Recent Labs  Lab 10/10/21 0731  BNP 1,070.9*    DDimer No results for input(s): DDIMER in the last 168 hours.   Radiology    DG Chest 1 View  Result Date: 10/12/2021 CLINICAL DATA:  Post left-sided thoracentesis. EXAM: CHEST  1 VIEW COMPARISON:  Radiographs 10/12/2021 and 10/11/2021. FINDINGS: 1255 hours. Interval significant decrease in size of previously demonstrated left pleural effusion with improved aeration of the left lung base. Small right pleural effusion and right basilar atelectasis remain. The heart size and mediastinal contours are stable with aortic atherosclerosis. No evidence of pneumothorax. The bones appear unchanged status post lower cervical fusion. Telemetry leads overlie the chest. IMPRESSION: Interval improved left pleural effusion and left basilar aeration following thoracentesis. No pneumothorax. Electronically Signed   By: Richardean Sale M.D.   On: 10/12/2021 13:03   CARDIAC CATHETERIZATION  Result Date: 10/13/2021 1. Right and left heart filling pressures are elevated. 2. Prominent v-wave suggestive of significant mitral regurgitation. 3. Cardiac output is normal. 4. Mixed pulmonary venous/pulmonary arterial HTN   US RENAL  Result Date: 10/12/2021 CLINICAL DATA:  Concern for hydronephrosis. EXAM: RENAL / URINARY TRACT ULTRASOUND COMPLETE COMPARISON:  Renal ultrasound dated 09/12/2021. FINDINGS: Right Kidney: Renal measurements: 8.6 x 3.3 x 4.1 cm = volume: 61 mL. Echogenicity within normal limits. A renal cyst measures 2.5 x 2.8 x 1.9 cm. No solid mass or hydronephrosis visualized. Left Kidney: Renal measurements: 8.2 x 4.5 x 4.5 cm = volume: 87 mL. Echogenicity within normal limits. No mass or hydronephrosis visualized. Bladder: Appears normal for degree of bladder distention. Other: The images are suboptimal due  to rapid breathing and inability to perform breath hold during the exam. IMPRESSION: No evidence of hydronephrosis. Electronically Signed   By: Zerita Boers M.D.   On: 10/12/2021 15:28   US Abdomen Limited RUQ (LIVER/GB)  Result Date: 10/12/2021 CLINICAL DATA:  Cirrhosis. EXAM: ULTRASOUND ABDOMEN LIMITED RIGHT UPPER QUADRANT COMPARISON:  Renal ultrasound dated 09/12/2021. FINDINGS: Gallbladder: No gallstones or wall thickening visualized. No sonographic Murphy sign noted by sonographer. Common bile duct: Diameter: 4 mm Liver: No focal lesion identified. Increased parenchymal echogenicity. Portal vein is patent on color Doppler imaging with normal direction of blood flow towards the liver. Other: A right pleural effusion is noted. Images are suboptimal due to fast breathing by the patient and inability to perform breath hold during the exam. IMPRESSION: 1. Increased liver echogenicity likely reflects hepatic steatosis. No focal hepatic lesion is identified. 2.  Right pleural effusion. Electronically Signed   By: Zerita Boers M.D.   On: 10/12/2021 15:30   US THORACENTESIS ASP PLEURAL SPACE W/IMG GUIDE  Result Date: 10/12/2021 INDICATION: Chronic kidney disease with recurrent left pleural effusion. Request for therapeutic thoracentesis. EXAM: ULTRASOUND GUIDED LEFT THORACENTESIS MEDICATIONS: 1% lidocaine 8 mL COMPLICATIONS: None immediate. PROCEDURE: An ultrasound guided thoracentesis was thoroughly discussed with the patient and questions answered. The benefits, risks, alternatives and complications were also discussed. The patient understands and wishes to proceed with the procedure. Written consent was obtained. Ultrasound was performed to localize and mark an adequate pocket of fluid in the left chest. The area was then prepped and draped in the normal sterile fashion. 1% Lidocaine was used for local anesthesia. Under ultrasound guidance a 6 Fr Safe-T-Centesis catheter was introduced. Thoracentesis was  performed. The catheter was removed and a dressing applied. FINDINGS: A total of approximately 1.2 L of red tinged fluid was removed. IMPRESSION: Successful ultrasound guided left thoracentesis yielding 1.2 L of pleural fluid. Read by: Gareth Eagle, PA-C Electronically Signed   By: Ruthann Cancer M.D.   On: 10/12/2021 14:48    Cardiac Studies   Echocardiogram 10/10/2021     1. Normal LV function; mild AI; severe MR; elevated mean gradient across  MV of 5 mmHg likely at least partially related to severe MR; moderate TR.   2. Left ventricular ejection fraction, by estimation, is 60 to 65%. The  left ventricle has normal function. The left ventricle has no regional  wall motion abnormalities. Left ventricular diastolic parameters are  indeterminate.   3. Right ventricular systolic function is normal. The right ventricular  size is normal. There is moderately elevated pulmonary artery systolic  pressure.   4. Left atrial size was moderately dilated.   5. Large pleural effusion in the left lateral region.   6. The mitral valve is abnormal. Severe mitral valve regurgitation. Mild  mitral stenosis.   7. Tricuspid valve regurgitation is moderate.   8. The aortic valve has an indeterminant number of cusps. Aortic valve  regurgitation is mild. No aortic stenosis is present.   9. The inferior vena cava is normal in size with <50% respiratory  variability, suggesting right atrial pressure of 8 mmHg.     Right heart catheterization 10/14/2021  Conclusion  1. Right and left heart filling pressures are elevated.  2. Prominent v-wave suggestive of significant mitral regurgitation.  3. Cardiac output is normal.  4. Mixed pulmonary venous/pulmonary arterial HTN   Right Heart  Right Heart Pressures RHC Procedural Findings: Hemodynamics (mmHg) RA mean 11 RV 66/9 PA 74/29, mean 49 PCWP mean  25, prominent V-waves to 37  Oxygen saturations: PA 62% AO 100%  Cardiac Output (Fick) 4.1  Cardiac  Index (Fick) 2.6 PVR 5.8 WU    Patient Profile     87 y.o. female with HTN, HLD, valvular heart disease (mod MR and mild AI 04/2021), CKD stage 4, carpal tunnel, bilateral DVT/PE, pleural effusion s/p thoracentesis, multiple myeloma. Underwent rotator cuff surgery 02/3611 complicated by bilateral PE/DVT, discharged on O2. She was also recently diagnosed with multiple myeloma and not felt to be a transplant candidate; planned for chemotherapy. She was admitted 10/10/2021 with worsening dyspnea/chest tightness found to be hypoxic despite 4L and required BiPAP. She had lactic acidosis and hypotension, treated with IVF and stress dose steroids. She was found to have markedly increased large left pleural effusion and small right pleural effusion, AKI on CKD stage 3b, elevated troponin, and severe mitral regurgitation. 2D echo 10/21 EF 60-65%, mild AI, severe MR, moderate TR. She underwent L sided thoracentesis on 10/21 (1.5L) and repeat L sided thoracentesis on 10/23 (1.2L). Cytology pending, listed as collected from 10/21.  Right heart catheterization performed 10/13/2021 showed mixed findings of acute left heart failure and intrinsic pulmonary artery hypertension (consider secondary to pulm embolism).  Assessment & Plan    Acute diastolic heart failure: Secondary at least in large part to severe mitral insufficiency.  Mean PA wedge at cardiac catheterization yesterday was 25 mmHg with prominent V waves.  Received intravenous diuretics yesterday and again this morning.  Still orthopneic.  Renal function improving with diuresis.  Continue diuretics until she is able to lie fully flat as long as renal function allows.  In May her BNP was elevated at 530, on this admission was over 1000.  Little room for treatment with vasodilators since her diastolic blood pressure is sometimes in the 50s.  Unable to use RAAS inhibitors due to renal dysfunction Mitral insufficiency: Described as moderate on outside echocardiogram  performed in February is unable to review images), moderate (but possibly underestimated) by echo in May.  The transthoracic echocardiogram does not show images of the mitral valve with sufficient resolution to make a diagnosis, but the general impression is that the leaflets are thickened and somewhat restricted (possibly rheumatic disease).  Would need a transesophageal echo to assess them better.  If the initial impression is correct, unfortunately this would exclude MitraClip repair.  She is not a surgical candidate for mitral valve repair/replacement.  Try to optimize vasodilator therapy, but blood pressure is a big limiting factor.  We will try a very low-dose of hydralazine. PAH: Mixed etiology due to left heart failure (primarily due to mitral regurgitation) and intrinsic pulmonary arterial disease (possibly sequelae of previous pulmonary embolism).  Note that she had evidence of moderate to severe pulmonary hypertension by echocardiogram performed in February. Acute on chronic respiratory failure with hypoxia: Improved with thoracentesis and diuresis. NSTEMI: She does not have any regional wall motion abnormalities on the echocardiogram, but it is quite possible that she has underlying CAD based on the degree of increase in cardiac enzymes.  She is not a candidate for invasive angiography and percutaneous revascularization or surgery.  It is quite reasonable to treat her with aggressive lipid-lowering therapy.  Recommend aspirin 81 mg daily, but this should be stopped when she restarts treatment with Eliquis for her pulmonary embolism. PE: Although this occurred in the setting of postoperative state, it is very likely that she has a hypercoagulable condition due to the hematological disorder.  Probably will remain  on lifelong anticoagulation. Sinus pauses: These were mostly due to blocked PACs.  She does have underlying first-degree AV block.  Trying to avoid beta-blockers. Chronic kidney disease  stage IV: Creatinine today is back to her baseline which seems to be around 2.2.  Monitor while we continue diuretics.   For questions or updates, please contact [place] Please consult [email or web address] for contact info under        Signed, Sanda Klein, MD  10/14/2021, 11:46 AM

## 2021-10-14 NOTE — Progress Notes (Signed)
ANTICOAGULATION CONSULT NOTE - follow up  Pharmacy Consult for Heparin (apixaban PTA) Indication: chest pain/ACS  Allergies  Allergen Reactions   Sulfa Antibiotics Nausea Only    Patient Measurements: Height: 5\' 3"  (160 cm) Weight: 53.8 kg (118 lb 9.7 oz) IBW/kg (Calculated) : 52.4 Heparin Dosing Weight: TBW  Vital Signs: Temp: 97.8 F (36.6 C) (10/25 1500) Temp Source: Oral (10/25 1500) BP: 119/66 (10/25 1337) Pulse Rate: 89 (10/25 0945)  Labs: Recent Labs    10/12/21 0252 10/13/21 0258 10/13/21 1437 10/14/21 0456 10/14/21 1505  HGB 10.0* 9.9* 10.9*  10.2* 10.1*  --   HCT 31.6* 31.6* 32.0*  30.0* 32.1*  --   PLT 194 191  --  176  --   APTT 69* 47*  --  65* 64*  HEPARINUNFRC >1.10* 1.04*  --  0.69  --   CREATININE 2.80* 2.48*  --  2.12*  --   TROPONINIHS 5,247*  --   --   --   --      Estimated Creatinine Clearance: 15.5 mL/min (A) (by C-G formula based on SCr of 2.12 mg/dL (H)).   Medical History: Past Medical History:  Diagnosis Date   Abnormal EKG    Carpal tunnel syndrome    CKD (chronic kidney disease), stage III (HCC)    DDD (degenerative disc disease), lumbar    Hypertension    Hypertension with renal disease    Menopause    Mixed hyperlipidemia    Muscular degeneration    Nonrheumatic aortic (valve) insufficiency    Nonrheumatic mitral valve regurgitation    Osteoarthritis    Pedal edema    Pleural effusion    Skin cancer    Vitamin D deficiency     Medications:  Scheduled:   aspirin EC  81 mg Oral Daily   atorvastatin  20 mg Oral Daily   Chlorhexidine Gluconate Cloth  6 each Topical Daily   famotidine  20 mg Oral Daily   furosemide  60 mg Intravenous BID   hydrALAZINE  10 mg Oral Q8H   [START ON 10/15/2021] hydrocortisone sod succinate (SOLU-CORTEF) inj  50 mg Intravenous Daily   mouth rinse  15 mL Mouth Rinse BID   sodium chloride flush  3 mL Intravenous Q12H   sodium chloride flush  3 mL Intravenous Q12H   Infusions:   sodium  chloride Stopped (10/10/21 1902)   sodium chloride 10 mL/hr at 10/14/21 0400   sodium chloride     heparin 900 Units/hr (10/14/21 0641)    Assessment: 42 yoF presented to ED with ShOB.  Heparin per pharmacy for ACS, hx PE/DVT on apixaban.  PTA Eliquis held, last dose 10/20 at 18:00 Troponin: 523, 836 >> 5378 CBC:  Hgb 11.5, Plt WNL Baseline aPTT 29 seconds, INR 1.7  Heparin turned off for RHC, pharmacy asked to resume now post-procedure.  10/14/2021: APTT remains just below goal despite increase in IV heparin rate this AM - now on IV heparin 900 units/hr Heparin level 0.69- trending down but likely still reflective of apixaban on board. CBC- Hg low but stable, pltc WNL No bleeding or infusion related concerns per RN  Goal of Therapy:  Heparin level 0.3-0.7 units/ml aPTT 66-102 seconds Monitor platelets by anticoagulation protocol: Yes   Plan:  Increase heparin to 1000 units/hr. Recheck aPTT in 8 hours Daily aPTT/heparin level and CBC Continue to monitor H&H and platelets   Adrian Saran, PharmD, BCPS Secure Chat if ?s 10/14/2021 3:52 PM

## 2021-10-14 NOTE — Progress Notes (Signed)
PROGRESS NOTE   Duane Idol Vigorito  MRN:[id number]    DOB: 04/13/1932    DOA: 10/10/2021  PCP: Ramachandran, Ajith, MD   I have briefly reviewed patients previous medical records in [facility].  Chief Complaint  Patient presents with   Shortness of Breath    Brief Narrative:  87-year-old female, lives with spouse, independent, on as needed home oxygen, medical history significant for hospitalization in May 2022 for right rotator cuff repair complicated by acute hypoxia and found to have bilateral DVT, PE and left pleural effusion, started on Eliquis, underwent left thoracentesis of 800 mL, recurrent left pleural effusion in September 2022 and again underwent thoracentesis with transudative effusion, now readmitted with acute respiratory failure with hypoxia requiring BiPAP in ED.  Admitted to ICU by CCM for acute on chronic hypoxic respiratory failure due to large recurrent left pleural effusion, hypotension, acute on chronic diastolic CHF, severe MR, pulmonary hypertension and NSTEMI.  S/p 1.5 L left thoracentesis.  Remains on IV heparin drip.  Cardiology consulting.  Possible right heart cath 10/24.  Medical history also significant for stage IV chronic kidney disease, HTN, HLD, mitral regurgitation.  S/p 1.2 L left thoracentesis 10/23.  S/p RHC 10/14.   Assessment & Plan:  Active Problems:   Pleural effusion, left   Acute respiratory failure (HCC)   Pressure injury of skin   Dyspnea   Acute on chronic diastolic CHF (congestive heart failure) (HCC)   Acute on chronic hypoxic respiratory failure - On as needed home oxygen.  Required BiPAP in ED.  Currently weaned down to 2 L/min [place] O2. - Multifactorial due to large left pleural effusion, recurrent and acute on chronic diastolic CHF. - S/p 1.5 L thoracentesis 10/21 and 1.2 L on 10/23 by IR.  Transudative effusion.  Pulmonology following. - S/p IV Lasix but creatinine gradually rising - Continue IV heparin.  On Eliquis at home  for history of DVT/PE. - Lower extremity venous Dopplers negative for DVT. - Improved and appears to have been weaned off to room air and saturating in the high 90s-100.  Acute on chronic left pleural effusion, recurrent - Has required repeated therapeutic thoracentesis. - Reportedly has been transudative as per pulmonology. - Trying to figure out the primary etiology.  S/p right heart cath 10/24 that showed elevated filling pressures with mixed pulmonary arterial/pulmonary venous hypertension and Cardiology initiated IV Lasix 60 mg twice daily. - Pulmonology working up further with renal ultrasound (no hydronephrosis), RUQ liver ultrasound (fatty liver, right pleural effusion and 24-hour protein collection. -Had sent out extensive lab work over the weekend at and that needs to be followed including autoimmune work-up. - S/p second left ultrasound-guided thoracentesis this admission by IR on 10/23.  Improved.  Acute on chronic diastolic CHF/severe MR/severe pulmonary artery hypertension. - Chest x-ray suggestive of pulmonary edema.  - Cardiology following and started IV Lasix 60 mg twice daily post cath.  Diuresing well.  NSTEMI type II - Cardiology following and feel that this is due to demand ischemia. - Troponin peaked to 5300. - 2D echo: LVEF 60 to 65%, no wall motion abnormalities.  Moderate to severe MR, moderate TR. - Remains on IV heparin instead of home Eliquis. - Not a left heart cath candidate which may push her into ESRD and does not appear to be good long-term HD candidate. - Continue aspirin (while on heparin and off Eliquis, can stop when back on Eliquis) and statins. - Await cardiology input regarding transitioning back from IV   heparin to Eliquis and DC aspirin.  Second-degree type I heart block - Transiently noted on telemetry overnight 10/23. - Replace potassium.  Magnesium normal. - Cardiology suspects type I second-degree AV block.  History of DVT and PE May 2022 -  Now on IV heparin instead of home Eliquis due to need for interventions and NSTEMI  Essential hypertension - Blood pressures controlled off of antihypertensives. - Off antihypertensives on admission due to hypotension.  Hyperlipidemia - Continue statins.  Hypotension on admission - Responded to IV fluid bolus. - Etiology?  Cardiac versus septic versus relative adrenal insufficiency. - Home med list showed Decadron 20 mg once a week on admission - Per pulmonology, no evidence of sepsis.  Sepsis ruled out.  Stress dose hydrocortisone being weaned off.  Will drop IV hydrocortisone to 50 mg once daily today and stop tomorrow.  Preadmission acyclovir?  Indication. -Holding acyclovir until renal functions improved.  Acute kidney injury complicating stage IV chronic kidney disease. - Recent baseline creatinine 2.3. - Follows with Dr. Peeples at [facility] - Creatinine had increased to 2.8 after IV Lasix.  Lasix resumed post RHC.  Creatinine down to 2.12. - Awaiting RHC 10/24 to determine volume status and further management. - Pulmonology working up with renal ultrasound (no hydronephrosis), labs (ANCA and GBM)   Hypokalemia - Replaced.  Magnesium 2.3.  Hyperphosphatemia - Monitor.  Anemia - Mild hemoglobin drop consistent with critical illness.  Stable in the 10 g range. - Follow periodically and transfuse if hemoglobin 7 g or less.  Multiple myeloma - Follows with Dr. Dorsey, oncology. - Was supposed to start chemotherapy on 10/10/2021.  Unable to start at this time due to hospitalization and acute illness. - Added oncologist to care team so he is aware of her admission.  Mild protein calorie malnutrition - Agree with nutrition consultation.  Adult failure to thrive - Multifactorial due to very advanced age, frail physical health and multiple severe significant comorbidities.  Lactic acidosis - Resolved.   Body mass index is 21.01 kg/m.  Nutritional Status         Pressure Ulcer:     DVT prophylaxis: SCD's Start: 10/13/21 2340  Currently on IV heparin drip.   Code Status: Full Code Family Communication: None at bedside today. Disposition:  Status is: Inpatient  Remains inpatient appropriate because: Very ill patient with complex multiple medical problems as noted above, remains on IV heparin drip.  Cardiology has her on IV Lasix.  Need to mobilize.  Okay to transfer to telemetry bed if fine with Cardiology.        Consultants:   PCCM Cardiology  Procedures:   Left thoracentesis x2 as noted above.  Right heart cath 10/14/2021:  Conclusion  1. Right and left heart filling pressures are elevated.  2. Prominent v-wave suggestive of significant mitral regurgitation.  3. Cardiac output is normal.  4. Mixed pulmonary venous/pulmonary arterial HTN  Antimicrobials:    Anti-infectives (From admission, onward)    Start     Dose/Rate Route Frequency Ordered Stop   10/10/21 1900  cefTRIAXone (ROCEPHIN) 2 g in sodium chloride 0.9 % 100 mL IVPB  Status:  Discontinued        2 g 200 mL/hr over 30 Minutes Intravenous Every 24 hours 10/10/21 1457 10/11/21 1039   10/10/21 1900  azithromycin (ZITHROMAX) 500 mg in sodium chloride 0.9 % 250 mL IVPB  Status:  Discontinued        500 mg 250 mL/hr over 60 Minutes Intravenous Every   24 hours 10/10/21 1457 10/11/21 1015   10/10/21 1045  piperacillin-tazobactam (ZOSYN) IVPB 3.375 g        3.375 g 100 mL/hr over 30 Minutes Intravenous  Once 10/10/21 1034 10/10/21 1255   10/10/21 1030  piperacillin-tazobactam (ZOSYN) IVPB 2.25 g  Status:  Discontinued        2.25 g 100 mL/hr over 30 Minutes Intravenous  Once 10/10/21 1017 10/10/21 1034         Subjective:  Denies complaints.  Reports breathing is back to baseline.  No chest pain.  Urinated well after Lasix.  Appetite gradually improving.  Objective:   Vitals:   10/14/21 0429 10/14/21 0500 10/14/21 0600 10/14/21 0800  BP: (!) 116/53  (!)  116/59 138/79  Pulse: 99  85 97  Resp: 13  17 14  Temp:    (!) 97.3 F (36.3 C)  TempSrc:      SpO2: 99%  99% 97%  Weight:  53.8 kg    Height:        General exam: Elderly female, small built, frail, lying comfortably almost supine in bed. Respiratory system: Diminished breath sounds in the bases but otherwise clear to auscultation.  No increased work of breathing. Cardiovascular system: S1 and S2 heard, RRR.?  JVD.  No murmurs.  No ankle edema. Gastrointestinal system: Abdomen is nondistended, soft and nontender. No organomegaly or masses felt. Normal bowel sounds heard. Central nervous system: Alert and oriented. No focal neurological deficits. Extremities: Symmetric 5 x 5 power.   Skin: No rashes, lesions or ulcers Psychiatry: Judgement and insight appear normal. Mood & affect appropriate.     Data Reviewed:   I have personally reviewed following labs and imaging studies   CBC: Recent Labs  Lab 10/10/21 0731 10/10/21 1548 10/11/21 0333 10/12/21 0252 10/13/21 0258 10/13/21 1437 10/14/21 0456  WBC 10.1 10.9*   < > 8.8 7.5  --  5.8  NEUTROABS 8.4* 9.5*  --   --   --   --   --   HGB 11.5* 10.6*   < > 10.0* 9.9* 10.9*  10.2* 10.1*  HCT 37.4 34.2*   < > 31.6* 31.6* 32.0*  30.0* 32.1*  MCV 96.1 94.5   < > 93.2 92.9  --  94.1  PLT 311 218   < > 194 191  --  176   < > = values in this interval not displayed.    Basic Metabolic Panel: Recent Labs  Lab 10/10/21 1548 10/11/21 0333 10/12/21 0252 10/13/21 0258 10/13/21 1437 10/14/21 0456  NA 138 138 138 138 141  143 141  K 4.2 4.1 3.8 3.3* 3.5  3.3* 4.1  CL 107 104 104 104  --  108  CO2 18* 24 23 23  --  23  GLUCOSE 105* 152* 133* 115*  --  126*  BUN 73* 79* 83* 88*  --  86*  CREATININE 1.93* 2.53* 2.80* 2.48*  --  2.12*  CALCIUM 9.0 8.9 8.9 8.7*  --  8.8*  MG 2.3 2.2 2.3  --   --  2.3  PHOS 5.8* 6.1* 5.9*  --   --   --     Liver Function Tests: Recent Labs  Lab 10/10/21 0731 10/10/21 1548 10/12/21 0252   AST 33 32 16  ALT 34 30 25  ALKPHOS 56 45 40  BILITOT 0.7 0.8 0.7  PROT 6.9 6.0* 5.8*  ALBUMIN 3.8 3.4* 3.2*    CBG: No results for input(s): GLUCAP in   the last 168 hours.  Microbiology Studies:   Recent Results (from the past 240 hour(s))  Resp Panel by RT-PCR (Flu A&B, Covid) Nasopharyngeal Swab     Status: None   Collection Time: 10/10/21  8:57 AM   Specimen: Nasopharyngeal Swab; Nasopharyngeal(NP) swabs in vial transport medium  Result Value Ref Range Status   SARS Coronavirus 2 by RT PCR NEGATIVE NEGATIVE Final    Comment: (NOTE) SARS-CoV-2 target nucleic acids are NOT DETECTED.  The SARS-CoV-2 RNA is generally detectable in upper respiratory specimens during the acute phase of infection. The lowest concentration of SARS-CoV-2 viral copies this assay can detect is 138 copies/mL. A negative result does not preclude SARS-Cov-2 infection and should not be used as the sole basis for treatment or other patient management decisions. A negative result may occur with  improper specimen collection/handling, submission of specimen other than nasopharyngeal swab, presence of viral mutation(s) within the areas targeted by this assay, and inadequate number of viral copies(<138 copies/mL). A negative result must be combined with clinical observations, patient history, and epidemiological information. The expected result is Negative.  Fact Sheet for Patients:  https://www.fda.gov/media/152166/download  Fact Sheet for Healthcare Providers:  https://www.fda.gov/media/152162/download  This test is no t yet approved or cleared by the [place] FDA and  has been authorized for detection and/or diagnosis of SARS-CoV-2 by FDA under an Emergency Use Authorization (EUA). This EUA will remain  in effect (meaning this test can be used) for the duration of the COVID-19 declaration under Section 564(b)(1) of the Act, 21 U.S.C.section 360bbb-3(b)(1), unless the authorization is terminated   or revoked sooner.       Influenza A by PCR NEGATIVE NEGATIVE Final   Influenza B by PCR NEGATIVE NEGATIVE Final    Comment: (NOTE) The Xpert Xpress SARS-CoV-2/FLU/RSV plus assay is intended as an aid in the diagnosis of influenza from Nasopharyngeal swab specimens and should not be used as a sole basis for treatment. Nasal washings and aspirates are unacceptable for Xpert Xpress SARS-CoV-2/FLU/RSV testing.  Fact Sheet for Patients: https://www.fda.gov/media/152166/download  Fact Sheet for Healthcare Providers: https://www.fda.gov/media/152162/download  This test is not yet approved or cleared by the [place] FDA and has been authorized for detection and/or diagnosis of SARS-CoV-2 by FDA under an Emergency Use Authorization (EUA). This EUA will remain in effect (meaning this test can be used) for the duration of the COVID-19 declaration under Section 564(b)(1) of the Act, 21 U.S.C. section 360bbb-3(b)(1), unless the authorization is terminated or revoked.  Performed at [facility], [address] [place], [place] [address]   Culture, blood (routine x 2)     Status: None (Preliminary result)   Collection Time: 10/10/21 10:08 AM   Specimen: BLOOD  Result Value Ref Range Status   Specimen Description   Final    BLOOD RIGHT ANTECUBITAL Performed at [facility], [address] [place], [place] [address]    Special Requests   Final    BOTTLES DRAWN AEROBIC AND ANAEROBIC Blood Culture results may not be optimal due to an inadequate volume of blood received in culture bottles Performed at [facility], [address] , [place] [address]    Culture   Final    NO GROWTH 4 DAYS Performed at [facility], [address] , [place] [address]    Report Status PENDING  Incomplete  Body fluid culture w Gram Stain     Status: None   Collection Time: 10/10/21    2:21 PM   Specimen: PATH Cytology  Pleural fluid  Result Value Ref Range Status   Specimen Description   Final    PLEURAL Performed at [facility] [address] [place], [place] [address]    Special Requests   Final    NONE Performed at [facility], [place] [address], [place], [place] [address]    Gram Stain   Final    NO SQUAMOUS EPITHELIAL CELLS SEEN FEW WBC SEEN NO ORGANISMS SEEN    Culture   Final    NO GROWTH 3 DAYS Performed at [facility], [place] [address] [place], [place] [address]    Report Status 10/14/2021 FINAL  Final  MRSA Next Gen by PCR, Nasal     Status: None   Collection Time: 10/10/21  5:34 PM   Specimen: Nasal Mucosa; Nasal Swab  Result Value Ref Range Status   MRSA by PCR Next Gen NOT DETECTED NOT DETECTED Final    Comment: (NOTE) The GeneXpert MRSA Assay (FDA approved for NASAL specimens only), is one component of a comprehensive MRSA colonization surveillance program. It is not intended to diagnose MRSA infection nor to guide or monitor treatment for MRSA infections. Test performance is not FDA approved in patients less than 20 years old. Performed at [facility], [place] [address] [place], [place] [address]   Culture, blood (routine x 2)     Status: None (Preliminary result)   Collection Time: 10/10/21  6:22 PM   Specimen: BLOOD LEFT FOREARM  Result Value Ref Range Status   Specimen Description   Final    BLOOD LEFT FOREARM Performed at [facility] [address] [place], [place] [address]    Special Requests   Final    BOTTLES DRAWN AEROBIC ONLY Blood Culture adequate volume Performed at [facility] [address], [place], [place] [address]    Culture   Final    NO GROWTH 4 DAYS Performed at [facility], [place] [address] [place], [place] [address]    Report Status PENDING  Incomplete  Respiratory (~20 pathogens) panel by PCR     Status: None   Collection Time:  10/11/21 10:29 AM   Specimen: Nasopharyngeal Swab; Respiratory  Result Value Ref Range Status   Adenovirus NOT DETECTED NOT DETECTED Final   Coronavirus 229E NOT DETECTED NOT DETECTED Final    Comment: (NOTE) The Coronavirus on the Respiratory Panel, DOES NOT test for the novel  Coronavirus (2019 nCoV)    Coronavirus HKU1 NOT DETECTED NOT DETECTED Final   Coronavirus NL63 NOT DETECTED NOT DETECTED Final   Coronavirus OC43 NOT DETECTED NOT DETECTED Final   Metapneumovirus NOT DETECTED NOT DETECTED Final   Rhinovirus / Enterovirus NOT DETECTED NOT DETECTED Final   Influenza A NOT DETECTED NOT DETECTED Final   Influenza B NOT DETECTED NOT DETECTED Final   Parainfluenza Virus 1 NOT DETECTED NOT DETECTED Final   Parainfluenza Virus 2 NOT DETECTED NOT DETECTED Final   Parainfluenza Virus 3 NOT DETECTED NOT DETECTED Final   Parainfluenza Virus 4 NOT DETECTED NOT DETECTED Final   Respiratory Syncytial Virus NOT DETECTED NOT DETECTED Final   Bordetella pertussis NOT DETECTED NOT DETECTED Final   Bordetella Parapertussis NOT DETECTED NOT DETECTED Final   Chlamydophila pneumoniae NOT DETECTED NOT DETECTED Final   Mycoplasma pneumoniae NOT DETECTED NOT DETECTED Final    Comment: Performed at [facility], [place]. [address],  [place], [place] [address]     Radiology Studies:  DG Chest 1 View  Result Date: 10/12/2021 CLINICAL DATA:  Post left-sided thoracentesis. EXAM: CHEST  1 VIEW COMPARISON:  Radiographs 10/12/2021 and 10/11/2021. FINDINGS: 1255 hours. Interval significant decrease in size of previously demonstrated left pleural effusion with improved aeration of the left lung base. Small right pleural effusion and right basilar atelectasis remain. The heart size and mediastinal contours are stable with aortic atherosclerosis. No evidence of pneumothorax. The bones appear unchanged status post lower cervical fusion. Telemetry leads overlie the chest. IMPRESSION: Interval improved left pleural  effusion and left basilar aeration following thoracentesis. No pneumothorax. Electronically Signed   By: William  Veazey M.D.   On: 10/12/2021 13:03   CARDIAC CATHETERIZATION  Result Date: 10/13/2021 1. Right and left heart filling pressures are elevated. 2. Prominent v-wave suggestive of significant mitral regurgitation. 3. Cardiac output is normal. 4. Mixed pulmonary venous/pulmonary arterial HTN   US RENAL  Result Date: 10/12/2021 CLINICAL DATA:  Concern for hydronephrosis. EXAM: RENAL / URINARY TRACT ULTRASOUND COMPLETE COMPARISON:  Renal ultrasound dated 09/12/2021. FINDINGS: Right Kidney: Renal measurements: 8.6 x 3.3 x 4.1 cm = volume: 61 mL. Echogenicity within normal limits. A renal cyst measures 2.5 x 2.8 x 1.9 cm. No solid mass or hydronephrosis visualized. Left Kidney: Renal measurements: 8.2 x 4.5 x 4.5 cm = volume: 87 mL. Echogenicity within normal limits. No mass or hydronephrosis visualized. Bladder: Appears normal for degree of bladder distention. Other: The images are suboptimal due to rapid breathing and inability to perform breath hold during the exam. IMPRESSION: No evidence of hydronephrosis. Electronically Signed   By: Tyler  Litton M.D.   On: 10/12/2021 15:28   DG CHEST PORT 1 VIEW  Result Date: 10/12/2021 CLINICAL DATA:  Left pleural effusion.  History of malignancy. EXAM: PORTABLE CHEST 1 VIEW COMPARISON:  Chest radiograph 08/11/2021 FINDINGS: Moderate left and small right pleural effusion, slightly increased compared to yesterday's exam. Opacification of the left lower lobe secondary to pleural fluid left lower lobe atelectasis. Right basilar atelectasis also noted. Diffuse hazy interstitial thickening, consistent with pulmonary edema. No pneumothorax. Unable to assess heart size due to adjacent consolidations. Aortic calcifications. No acute osseous abnormality. IMPRESSION: Bilateral pleural effusions, moderate left and small right, mildly increased compared to yesterday's  exam. Bibasilar compressive atelectasis. Mild pulmonary edema. Aortic Atherosclerosis (ICD10-I70.0). Electronically Signed   By: Laura  Parra M.D.   On: 10/12/2021 10:57   US Abdomen Limited RUQ (LIVER/GB)  Result Date: 10/12/2021 CLINICAL DATA:  Cirrhosis. EXAM: ULTRASOUND ABDOMEN LIMITED RIGHT UPPER QUADRANT COMPARISON:  Renal ultrasound dated 09/12/2021. FINDINGS: Gallbladder: No gallstones or wall thickening visualized. No sonographic Murphy sign noted by sonographer. Common bile duct: Diameter: 4 mm Liver: No focal lesion identified. Increased parenchymal echogenicity. Portal vein is patent on color Doppler imaging with normal direction of blood flow towards the liver. Other: A right pleural effusion is noted. Images are suboptimal due to fast breathing by the patient and inability to perform breath hold during the exam. IMPRESSION: 1. Increased liver echogenicity likely reflects hepatic steatosis. No focal hepatic lesion is identified. 2.  Right pleural effusion. Electronically Signed   By: Tyler  Litton M.D.   On: 10/12/2021 15:30   US THORACENTESIS ASP PLEURAL SPACE W/IMG GUIDE  Result Date: 10/12/2021 INDICATION: Chronic kidney disease with recurrent left pleural effusion. Request for therapeutic thoracentesis. EXAM: ULTRASOUND GUIDED LEFT THORACENTESIS MEDICATIONS: 1% lidocaine 8 mL COMPLICATIONS: None immediate. PROCEDURE: An ultrasound guided thoracentesis was thoroughly discussed with   the patient and questions answered. The benefits, risks, alternatives and complications were also discussed. The patient understands and wishes to proceed with the procedure. Written consent was obtained. Ultrasound was performed to localize and mark an adequate pocket of fluid in the left chest. The area was then prepped and draped in the normal sterile fashion. 1% Lidocaine was used for local anesthesia. Under ultrasound guidance a 6 Fr Safe-T-Centesis catheter was introduced. Thoracentesis was performed. The  catheter was removed and a dressing applied. FINDINGS: A total of approximately 1.2 L of red tinged fluid was removed. IMPRESSION: Successful ultrasound guided left thoracentesis yielding 1.2 L of pleural fluid. Read by: Gareth Eagle, PA-C Electronically Signed   By: Ruthann Cancer M.D.   On: 10/12/2021 14:48     Scheduled Meds:    aspirin EC  81 mg Oral Daily   atorvastatin  20 mg Oral Daily   Chlorhexidine Gluconate Cloth  6 each Topical Daily   famotidine  20 mg Oral Daily   furosemide  60 mg Intravenous BID   hydrocortisone sod succinate (SOLU-CORTEF) inj  50 mg Intravenous Q12H   mouth rinse  15 mL Mouth Rinse BID   sodium chloride flush  3 mL Intravenous Q12H   sodium chloride flush  3 mL Intravenous Q12H    Continuous Infusions:    sodium chloride Stopped (10/10/21 1902)   sodium chloride 10 mL/hr at 10/14/21 0400   sodium chloride     heparin 900 Units/hr (10/14/21 0641)     LOS: 4 days     Vernell Leep, MD, [place], [facility]    To contact the attending provider between 7A-7P or the covering provider during after hours 7P-7A, please log into the web site [email or web address] and access using universal [place] password for that web site. If you do not have the password, please call the hospital operator.  10/14/2021, 8:44 AM

## 2021-10-14 NOTE — Progress Notes (Signed)
ANTICOAGULATION CONSULT NOTE - follow up  Pharmacy Consult for Heparin Indication: chest pain/ACS  Allergies  Allergen Reactions   Sulfa Antibiotics Nausea Only    Patient Measurements: Height: 5\' 3"  (160 cm) Weight: 53.8 kg (118 lb 9.7 oz) IBW/kg (Calculated) : 52.4 Heparin Dosing Weight: TBW  Vital Signs: Temp: 97.3 F (36.3 C) (10/25 0400) Temp Source: Axillary (10/25 0400) BP: 116/53 (10/25 0429) Pulse Rate: 99 (10/25 0429)  Labs: Recent Labs    10/12/21 0252 10/13/21 0258 10/13/21 1437 10/14/21 0456  HGB 10.0* 9.9* 10.9*  10.2* 10.1*  HCT 31.6* 31.6* 32.0*  30.0* 32.1*  PLT 194 191  --  176  APTT 69* 47*  --  65*  HEPARINUNFRC >1.10* 1.04*  --  0.69  CREATININE 2.80* 2.48*  --  2.12*  TROPONINIHS 5,247*  --   --   --      Estimated Creatinine Clearance: 15.5 mL/min (A) (by C-G formula based on SCr of 2.12 mg/dL (H)).   Medical History: Past Medical History:  Diagnosis Date   Abnormal EKG    Carpal tunnel syndrome    CKD (chronic kidney disease), stage III (HCC)    DDD (degenerative disc disease), lumbar    Hypertension    Hypertension with renal disease    Menopause    Mixed hyperlipidemia    Muscular degeneration    Nonrheumatic aortic (valve) insufficiency    Nonrheumatic mitral valve regurgitation    Osteoarthritis    Pedal edema    Pleural effusion    Skin cancer    Vitamin D deficiency     Medications:  Scheduled:   aspirin EC  81 mg Oral Daily   atorvastatin  20 mg Oral Daily   Chlorhexidine Gluconate Cloth  6 each Topical Daily   famotidine  20 mg Oral Daily   furosemide  60 mg Intravenous BID   hydrocortisone sod succinate (SOLU-CORTEF) inj  50 mg Intravenous Q12H   mouth rinse  15 mL Mouth Rinse BID   sodium chloride flush  3 mL Intravenous Q12H   sodium chloride flush  3 mL Intravenous Q12H   Infusions:   sodium chloride Stopped (10/10/21 1902)   sodium chloride 10 mL/hr at 10/14/21 0400   sodium chloride     heparin 850  Units/hr (10/14/21 0400)    Assessment: 36 yoF presented to ED with ShOB.  Heparin per pharmacy for ACS, hx PE/DVT.  PTA Eliquis held, last dose 10/20 at 18:00 Troponin: 523, 836 >> 5378 CBC:  Hgb 11.5, Plt WNL Baseline aPTT 29 seconds, INR 1.7  Heparin turned off for RHC, pharmacy asked to resume now post-procedure.  10/14/2021: APTT 65sec- slightly below goal on IV heparin 850 units/hr Heparin level 0.69- trending down but likely still reflective of apixaban on board. CBC- Hg low but stable, pltc WNL No bleeding or infusion related concerns per RN  Goal of Therapy:  Heparin level 0.3-0.7 units/ml aPTT 66-102 seconds Monitor platelets by anticoagulation protocol: Yes   Plan:  Increase heparin to 900 units/hr. Recheck aPTT in 8 hours Daily aPTT/heparin level and CBC Continue to monitor H&H and platelets  Netta Cedars, Vena [place], BCPS Clinical Pharmacist  10/14/2021 5:45 AM

## 2021-10-15 ENCOUNTER — Inpatient Hospital Stay (HOSPITAL_COMMUNITY): Payer: Medicare PPO

## 2021-10-15 ENCOUNTER — Telehealth: Payer: Self-pay | Admitting: Pulmonary Disease

## 2021-10-15 DIAGNOSIS — I5033 Acute on chronic diastolic (congestive) heart failure: Secondary | ICD-10-CM | POA: Diagnosis not present

## 2021-10-15 DIAGNOSIS — E43 Unspecified severe protein-calorie malnutrition: Secondary | ICD-10-CM | POA: Diagnosis present

## 2021-10-15 DIAGNOSIS — I2721 Secondary pulmonary arterial hypertension: Secondary | ICD-10-CM | POA: Diagnosis not present

## 2021-10-15 DIAGNOSIS — I441 Atrioventricular block, second degree: Secondary | ICD-10-CM | POA: Diagnosis present

## 2021-10-15 DIAGNOSIS — J9601 Acute respiratory failure with hypoxia: Secondary | ICD-10-CM

## 2021-10-15 DIAGNOSIS — I340 Nonrheumatic mitral (valve) insufficiency: Secondary | ICD-10-CM | POA: Diagnosis not present

## 2021-10-15 DIAGNOSIS — I959 Hypotension, unspecified: Secondary | ICD-10-CM | POA: Diagnosis present

## 2021-10-15 DIAGNOSIS — E876 Hypokalemia: Secondary | ICD-10-CM | POA: Clinically undetermined

## 2021-10-15 LAB — CBC
HCT: 31.2 % — ABNORMAL LOW (ref 36.0–46.0)
Hemoglobin: 9.7 g/dL — ABNORMAL LOW (ref 12.0–15.0)
MCH: 29.5 pg (ref 26.0–34.0)
MCHC: 31.1 g/dL (ref 30.0–36.0)
MCV: 94.8 fL (ref 80.0–100.0)
Platelets: 182 10*3/uL (ref 150–400)
RBC: 3.29 MIL/uL — ABNORMAL LOW (ref 3.87–5.11)
RDW: 15 % (ref 11.5–15.5)
WBC: 7.3 10*3/uL (ref 4.0–10.5)
nRBC: 0 % (ref 0.0–0.2)

## 2021-10-15 LAB — BASIC METABOLIC PANEL
Anion gap: 9 (ref 5–15)
BUN: 76 mg/dL — ABNORMAL HIGH (ref 8–23)
CO2: 23 mmol/L (ref 22–32)
Calcium: 8.1 mg/dL — ABNORMAL LOW (ref 8.9–10.3)
Chloride: 103 mmol/L (ref 98–111)
Creatinine, Ser: 2.11 mg/dL — ABNORMAL HIGH (ref 0.44–1.00)
GFR, Estimated: 22 mL/min — ABNORMAL LOW (ref >60)
Glucose, Bld: 111 mg/dL — ABNORMAL HIGH (ref 70–99)
Potassium: 3 mmol/L — ABNORMAL LOW (ref 3.5–5.1)
Sodium: 135 mmol/L (ref 135–145)

## 2021-10-15 LAB — CULTURE, BLOOD (ROUTINE X 2)
Culture: NO GROWTH
Culture: NO GROWTH
Special Requests: ADEQUATE

## 2021-10-15 LAB — ANCA TITERS
Atypical P-ANCA titer: <1:20 {titer}
C-ANCA: <1:20 {titer}
P-ANCA: <1:20 {titer}

## 2021-10-15 LAB — HEPARIN LEVEL (UNFRACTIONATED)
Heparin Unfractionated: 0.61 IU/mL (ref 0.30–0.70)
Heparin Unfractionated: 0.74 IU/mL — ABNORMAL HIGH (ref 0.30–0.70)
Heparin Unfractionated: 0.85 IU/mL — ABNORMAL HIGH (ref 0.30–0.70)

## 2021-10-15 LAB — APTT: aPTT: 113 seconds — ABNORMAL HIGH (ref 24–36)

## 2021-10-15 LAB — CYCLIC CITRUL PEPTIDE ANTIBODY, IGG/IGA: CCP Antibodies IgG/IgA: 1 units (ref 0–19)

## 2021-10-15 MED ORDER — HEPARIN (PORCINE) 25000 UT/250ML-% IV SOLN: 950.0000 [IU]/h | INTRAVENOUS | Status: DC

## 2021-10-15 MED ORDER — HEPARIN (PORCINE) 25000 UT/250ML-% IV SOLN
650.0000 [IU]/h | INTRAVENOUS | Status: DC
  Administered 2021-10-15 – 2021-10-21 (×6): 850 [IU]/h via INTRAVENOUS
  Filled 2021-10-15 (×6): qty 250

## 2021-10-15 MED ORDER — FUROSEMIDE 40 MG PO TABS
80.0000 mg | ORAL_TABLET | Freq: Every day | ORAL | Status: DC
  Administered 2021-10-16: 80 mg via ORAL
  Filled 2021-10-15: qty 2

## 2021-10-15 MED ORDER — ADULT MULTIVITAMIN W/MINERALS CH
1.0000 | ORAL_TABLET | Freq: Every day | ORAL | Status: DC
  Administered 2021-10-15 – 2021-10-25 (×11): 1 via ORAL
  Filled 2021-10-15 (×12): qty 1

## 2021-10-15 MED ORDER — POTASSIUM CHLORIDE CRYS ER 20 MEQ PO TBCR
40.0000 meq | EXTENDED_RELEASE_TABLET | Freq: Two times a day (BID) | ORAL | Status: AC
  Administered 2021-10-15 (×2): 40 meq via ORAL
  Filled 2021-10-15 (×2): qty 2

## 2021-10-15 NOTE — Assessment & Plan Note (Signed)
Creatinine improving today with diuresis

## 2021-10-15 NOTE — Assessment & Plan Note (Signed)
See above

## 2021-10-15 NOTE — Hospital Course (Addendum)
Jillian Cooley is an 87 y.o. F with dCHF on home O2 PRN, recent Dx MM not yet started chemo, DVT and PE after rotator cuff surgery last May, on apixaban, as well as recurrent transudative pleural effusion (see below) who presetned with slowly progressive SOB, finally severe.  In the ER, acute respiratory failure with hypoxia requiring BiPAP in ED.  CXR showed large left effusion, admitted to ICU by CCM.     10/21: Admitted to ICU, underwent thora 1.5L; echo shows severe MR 10/23: Repeat thora, 1.2L 10/24: RHC showed normal CO, high filling pressures, prominent v-wave

## 2021-10-15 NOTE — Assessment & Plan Note (Addendum)
Doubt ACS, likely demand ischemia in the setting of severe mitral regurgitation -Continue aspirin, atorvastatin

## 2021-10-15 NOTE — Assessment & Plan Note (Signed)
-   Continue furosemide, hydralazine

## 2021-10-15 NOTE — Progress Notes (Signed)
Declaration of a Desire for a Natural Death and [place] copy has been obtained and placed in pt's shadow chart.

## 2021-10-15 NOTE — Plan of Care (Signed)
  Problem: Education: Goal: Knowledge of General Education information will improve Description: Including pain rating scale, medication(s)/side effects and non-pharmacologic comfort measures Outcome: Progressing   Problem: Clinical Measurements: Goal: Ability to maintain clinical measurements within normal limits will improve Outcome: Progressing Goal: Will remain free from infection Outcome: Progressing Goal: Respiratory complications will improve Outcome: Progressing   Problem: Pain Managment: Goal: General experience of comfort will improve Outcome: Progressing   Problem: Safety: Goal: Ability to remain free from injury will improve Outcome: Progressing   Problem: Skin Integrity: Goal: Risk for impaired skin integrity will decrease Outcome: Progressing   Cindy S. Brigitte Pulse BSN, RN, [place] 10/15/2021 2:04 AM

## 2021-10-15 NOTE — Assessment & Plan Note (Signed)
Blood pressure stabilized

## 2021-10-15 NOTE — Assessment & Plan Note (Addendum)
Uses O2 "as needed" at home due to chron res failure due to CHF.   Here due to severe mitral regurgitation and pleural effusion due to mitral regurgitation

## 2021-10-15 NOTE — Progress Notes (Signed)
ANTICOAGULATION CONSULT NOTE - follow up  Pharmacy Consult for Heparin (apixaban PTA) Indication: chest pain/ACS  Allergies  Allergen Reactions   Sulfa Antibiotics Nausea Only    Patient Measurements: Height: 5\' 3"  (160 cm) Weight: 53.8 kg (118 lb 9.7 oz) IBW/kg (Calculated) : 52.4 Heparin Dosing Weight: TBW  Vital Signs: Temp: 97.9 F (36.6 C) (10/25 1900) Temp Source: Oral (10/25 1900) BP: 127/70 (10/26 0000) Pulse Rate: 92 (10/26 0000)  Labs: Recent Labs    10/12/21 0252 10/13/21 0258 10/13/21 1437 10/14/21 0456 10/14/21 1505 10/14/21 2351  HGB 10.0* 9.9* 10.9*  10.2* 10.1*  --   --   HCT 31.6* 31.6* 32.0*  30.0* 32.1*  --   --   PLT 194 191  --  176  --   --   APTT 69* 47*  --  65* 64* 113*  HEPARINUNFRC >1.10* 1.04*  --  0.69  --  0.85*  CREATININE 2.80* 2.48*  --  2.12*  --   --   TROPONINIHS 5,247*  --   --   --   --   --      Estimated Creatinine Clearance: 15.5 mL/min (A) (by C-G formula based on SCr of 2.12 mg/dL (H)).   Medical History: Past Medical History:  Diagnosis Date   Abnormal EKG    Carpal tunnel syndrome    CKD (chronic kidney disease), stage III (HCC)    DDD (degenerative disc disease), lumbar    Hypertension    Hypertension with renal disease    Menopause    Mixed hyperlipidemia    Muscular degeneration    Nonrheumatic aortic (valve) insufficiency    Nonrheumatic mitral valve regurgitation    Osteoarthritis    Pedal edema    Pleural effusion    Skin cancer    Vitamin D deficiency     Medications:  Scheduled:   aspirin EC  81 mg Oral Daily   atorvastatin  20 mg Oral Daily   Chlorhexidine Gluconate Cloth  6 each Topical Daily   famotidine  20 mg Oral Daily   furosemide  60 mg Intravenous BID   hydrALAZINE  10 mg Oral Q8H   hydrocortisone sod succinate (SOLU-CORTEF) inj  50 mg Intravenous Daily   mouth rinse  15 mL Mouth Rinse BID   sodium chloride flush  3 mL Intravenous Q12H   sodium chloride flush  3 mL Intravenous  Q12H   Infusions:   sodium chloride Stopped (10/10/21 1902)   sodium chloride 10 mL/hr at 10/14/21 1800   sodium chloride     heparin 1,000 Units/hr (10/14/21 1800)    Assessment: 48 yoF presented to ED with ShOB.  Heparin per pharmacy for ACS, hx PE/DVT on apixaban.  PTA Eliquis held, last dose 10/20 at 18:00 Troponin: 523, 836 >> 5378 CBC:  Hgb 11.5, Plt WNL Baseline aPTT 29 seconds, INR 1.7  Heparin turned off for RHC 10/24, pharmacy asked to resume now post-procedure.  10/15/2021: APTT 113 sec - now above desired goal on on IV heparin 1000 units/hr Heparin level 0.85- also elevated; appears to be correlating with aptt so will monitor using only heparin level going forward CBC- Hg low but stable, pltc WNL No bleeding or infusion related concerns per RN.  Confirmed lab drawn from opposite arm as heparin infusing  Goal of Therapy:  Heparin level 0.3-0.7 units/ml aPTT 66-102 seconds Monitor platelets by anticoagulation protocol: Yes   Plan:  Hold heparin x1h then resume at decreased heparin rate of 950  units/hr. Recheck heparin level at 1000 Daily heparin level and CBC while on heparin F/U plans to resume apixaban  Netta Cedars, PharmD, BCPS 10/15/2021 12:41 AM

## 2021-10-15 NOTE — Assessment & Plan Note (Signed)
Hemoglobin stable 

## 2021-10-15 NOTE — Progress Notes (Signed)
ANTICOAGULATION CONSULT NOTE - follow up  Pharmacy Consult for Heparin (apixaban PTA) Indication: chest pain/ACS  Allergies  Allergen Reactions   Sulfa Antibiotics Nausea Only    Patient Measurements: Height: 5\' 3"  (160 cm) Weight: 53.8 kg (118 lb 9.7 oz) IBW/kg (Calculated) : 52.4 Heparin Dosing Weight: TBW  Vital Signs: Temp: 97.4 F (36.3 C) (10/26 1200) Temp Source: Oral (10/26 1200) BP: 116/60 (10/26 1000) Pulse Rate: 89 (10/26 1000)  Labs: Recent Labs    10/13/21 0258 10/13/21 1437 10/14/21 0456 10/14/21 1505 10/14/21 2351 10/15/21 0255 10/15/21 0955  HGB 9.9* 10.9*  10.2* 10.1*  --   --  9.7*  --   HCT 31.6* 32.0*  30.0* 32.1*  --   --  31.2*  --   PLT 191  --  176  --   --  182  --   APTT 47*  --  65* 64* 113*  --   --   HEPARINUNFRC 1.04*  --  0.69  --  0.85*  --  0.74*  CREATININE 2.48*  --  2.12*  --   --  2.11*  --      Estimated Creatinine Clearance: 15.5 mL/min (A) (by C-G formula based on SCr of 2.11 mg/dL (H)).   Medical History: Past Medical History:  Diagnosis Date   Abnormal EKG    Carpal tunnel syndrome    CKD (chronic kidney disease), stage III (HCC)    DDD (degenerative disc disease), lumbar    Hypertension    Hypertension with renal disease    Menopause    Mixed hyperlipidemia    Muscular degeneration    Nonrheumatic aortic (valve) insufficiency    Nonrheumatic mitral valve regurgitation    Osteoarthritis    Pedal edema    Pleural effusion    Skin cancer    Vitamin D deficiency     Medications:  Scheduled:   aspirin EC  81 mg Oral Daily   atorvastatin  20 mg Oral Daily   Chlorhexidine Gluconate Cloth  6 each Topical Daily   famotidine  20 mg Oral Daily   furosemide  60 mg Intravenous BID   hydrALAZINE  10 mg Oral Q8H   hydrocortisone sod succinate (SOLU-CORTEF) inj  50 mg Intravenous Daily   mouth rinse  15 mL Mouth Rinse BID   multivitamin with minerals  1 tablet Oral Daily   potassium chloride  40 mEq Oral BID    sodium chloride flush  3 mL Intravenous Q12H   sodium chloride flush  3 mL Intravenous Q12H   Infusions:   sodium chloride Stopped (10/10/21 1902)   sodium chloride Stopped (10/14/21 1936)   sodium chloride     heparin      Assessment: 88 yoF presented to ED with ShOB.  Heparin per pharmacy for ACS, hx PE/DVT on apixaban.  PTA Eliquis held, last dose 10/20 at 18:00 Troponin: 523, 836 >> 5378 CBC:  Hgb 11.5, Plt WNL Baseline aPTT 29 seconds, INR 1.7  Heparin turned off for RHC 10/24, pharmacy asked to resume now post-procedure.  10/15/2021: Heparin level 0.74, elevated but decreasing after previous rate decrease. CBC- Hg low but stable, pltc WNL No bleeding or infusion related concerns per RN.    Goal of Therapy:  Heparin level 0.3-0.7 units/ml aPTT 66-102 seconds Monitor platelets by anticoagulation protocol: Yes   Plan:  Decrease to heparin 850 units/hr. Recheck heparin level in 8 hours Daily heparin level and CBC while on heparin F/U plans to resume apixaban  Gretta Arab PharmD, BCPS Clinical Pharmacist [facility] main pharmacy [phone] 10/15/2021 12:34 PM

## 2021-10-15 NOTE — Assessment & Plan Note (Signed)
As evidenced by severe subcutaneous loss of muscle mass and fat

## 2021-10-15 NOTE — Assessment & Plan Note (Addendum)
-   Continue Lasix - Continue new hydralazine

## 2021-10-15 NOTE — Assessment & Plan Note (Addendum)
Chest x-ray today personally reviewed by me, substantially larger after thoracentesis 3 days ago.  Of note, multiple myeloma does not cause pleural effusion.  Even with this pleural effusion and her congestive heart failure, although the dose of dexamethasone may have to be adjusted, she can proceed with therapy.  Oncology will see the patient. - Continue diuresis - Repeat thoracentesis as needed

## 2021-10-15 NOTE — Progress Notes (Signed)
NAME:  Jillian Cooley, MRN:  [id number], DOB:  December 21, 1932, LOS: 5 ADMISSION DATE:  10/10/2021, CONSULTATION DATE:  10/10/2021 REFERRING MD:  Almyra Free - EDP CHIEF COMPLAINT: Respiratory failure, large left pleural effusion, metabolic acidosis   History of Present Illness:  87 year old woman who presented to [facility] ED 10/21 for SOB/dyspnea and hypoxia. PMHx significant for HTN, HLD, NSTEMI (2022), severe MR, pulmonary HTN, acute-on-chronic diastolic HF, DVT/PE (06/6225, on Eliquis), recurrent L pleural effusion, acute-on-chronic hypoxic respiratory failure, multiple myeloma, CKD stage IV.  Patient was scheduled for chemotherapy 10/21 but was found to be hypoxic to 80s (per husband) despite 4L [place]. She required BiPAP. Blood pressures were soft and labs revealed metabolic acidosis with pH 7.1 and LA 4.0. Additionally, patient was noted to have mild AKI on CKD and elevated BNP to 1000. Troponins peaked at 5300, c/f NSTEMI (type II). CXR demonstrated large L pleural effusion.  PCCM called for assistance with management of critical illness and recurrent transudative L pleural effusion.  Pertinent Medical History:   Past Medical History:  Diagnosis Date   Abnormal EKG    Carpal tunnel syndrome    CKD (chronic kidney disease), stage III (HCC)    DDD (degenerative disc disease), lumbar    Hypertension    Hypertension with renal disease    Menopause    Mixed hyperlipidemia    Muscular degeneration    Nonrheumatic aortic (valve) insufficiency    Nonrheumatic mitral valve regurgitation    Osteoarthritis    Pedal edema    Pleural effusion    Skin cancer    Vitamin D deficiency    Significant Hospital Events: Including procedures, antibiotic start and stop dates in addition to other pertinent events   10/21 - Admitted via [facility] ED. On BiPAP. Soft BP, LA 4 with pH 7.1, s/p 1.5L thoracentesis (transudate with LDH 64, cyto pending). SDS, fluids for soft BPs.  IV heparin for NSTEMI. 10/22 - seen by  cardiology.  Demand ischemia suspected troponin peaked at 5000.  Currently on 4 L oxygen and off BiPAP.  Feeling better after thoracentesis.  Chest x-ray with reduced but residual left pleural effusion.  Currently on bicarbonate infusion and heparin infusion.  Not on pressors. AFebirle an cultur enegative. BP improved 100-119 sbp.,. and dias 50s. 10/24 - RHC today, elevated R and L heart filling pressures, significant MR, normal CO, mixed pulmonary venous/pulmonary arterial HTN.  Interim History / Subjective:  Pt reports feeling "ok" today. No complaints of shortness of breath, chest pain Afebrile  I/O 1.8L UOP, -1.4L in last 24 hours   Objective:  Blood pressure 116/60, pulse 89, temperature (!) 97.4 F (36.3 C), temperature source Oral, resp. rate 14, height $RemoveBe'5\' 3"'rsWlMovIJ$  (1.6 m), weight 53.8 kg, SpO2 97 %.      Estimated body mass index is 21.01 kg/m as calculated from the following:   Height as of this encounter: $RemoveBeforeD'5\' 3"'[id number]$  (1.6 m).   Weight as of this encounter: 53.8 kg.   Intake/Output Summary (Last 24 hours) at 10/15/2021 1250 Last data filed at 10/15/2021 1200 Gross per 24 hour  Intake 453.99 ml  Output 1425 ml  Net -971.01 ml   Filed Weights   10/12/21 0500 10/14/21 0500 10/15/21 0500  Weight: 55.6 kg 53.8 kg 53.8 kg   Physical Examination: General: elderly adult female sitting up in bed in NAD, family at bedside (son, daughter) HEENT: MM pink/moist, wearing glasses, anicteric  Neuro: AAOx4, speech clear, MAE, non-focal CV: s1s2 RRR, period (seconds) of irregular  rhythm while at patients bedside, spontaneously resolved, no m/r/g PULM: non-labored at rest, lungs bilaterally clear anterior, diminished with bibasilar posterior crackles  GI: soft, bsx4 active  Extremities: warm/dry, no edema  Skin: no rashes or lesions   Resolved Hospital Problem List:   Severe lactic acidosis and metabolic acidosis at admission.?  Due to cardiac versus renal injury for myeloma versus  sepsis  Assessment & Plan:   Chronic recurrent left pleural effusion  Transudative (February 2022, May 2022, September 2022) S/p 1.5L thora with dyspnea improvement (transudate). Cytology with lymphoid cells / reactive.   -follow up CXR 10/26  -will need close monitoring for re-accumulation  -euvolemic fluid status important  -if reoccurs, would repeat cytology  -suspect fluid / effusion is related to severe MR / CHF but given atypical cells, would tap again repeat cytology & re-image chest  -will arrange pulmonary follow up with Dr. Silas Flood   Acute-on-chronic hypoxemic respiratory failure On 2LNC at home, requiring BiPAP on admission for worsened dyspnea, hypoxia, SOB -O2 for sats >90% -pulmonary hygiene  -has O2 at home, will need ambulatory O2 needs assessment prior to discharge to determine rate  -diuresis for euvolemia   History of DVT/PE in May 2022 Prior to admission, currently on Eliquis. Duplex LE 10/10/21 - negative for DVT. -continue heparin gtt for now, defer transition to oral agent to [facility] / Cardiology  -resume eliquis at discharge     Acute-on-chronic diastolic dysfunction and severe mitral regurgitation with severe elevated pulmonary artery pressures and left atrial dilatation Grade 2 diastolic dysfunction with severe elevation pulmonary artery pressures - May 2022 Type II NSTEMI Demand ischemia per Cards. Echo on admission with severe increased PASP and severe MR. Peak troponins 5300.  -Appreciate Cardiology  -heparin as above -follow up ANCA -ANA, DS-DNA, SSA, SSB, SCL70, HIV, RF, CCP negative   Acute kidney injury on CKD IV Renal US without evidence of hydronephrosis. -Trend BMP / urinary output -Replace electrolytes as indicated -ANCA, GBM negative  -Avoid nephrotoxic agents, ensure adequate renal perfusion  Multiple myeloma  Untreated. Due to start chemotherapy 10/10/2021.  Sees [name]. -per TRH / ONC   Failure to thrive - Prior to & Present on  Admit (admit to weight loss prior to admit and low appetite x 1 mo) Mild protein calorie malnutrition - Prior to & Present on Admit -encourage nutrition   GOC Extensive discussion with patient, son & daughter regarding plan of care.  Discussed how complex her medical needs currently are to include severe MR, CKD, PE, Pulmonary HTN, CHF, recurrent pleural effusion and multiple myeloma. We discussed how each impacts the other organ systems.  We reviewed "best case" scenario of shooting for a euvolemic state, focusing on PT/nutrition and getting her to the [place] office for evaluation for chemotherapy.  We discussed concepts such as impacts of therapy on her chronic disease states.  We reviewed what was most important to her - currently, she hopes to go home.  We also discussed that she is in charge and if ever she feels the impact of healthcare interventions are too great, she always has the ability to stop.  She understands that given her complexity of issues that chemotherapy may not be an achievable goal.   -consider palliative care consultation for home needs   Best Practice (right click and "Reselect all SmartList Selections" daily)  Diet/type: NPO w/ oral meds DVT prophylaxis: systemic heparin GI prophylaxis: H2B Lines: N/A Foley:  N/A Code Status:  full code  PCCM will be available PRN.  Please call if new needs arise.   Critical care time: N/A   Noe Gens, MSN, APRN, NP-C, AGACNP-BC [facility] 10/15/2021, 1:08 PM   Please see [email or web address] for pager details.   From 7A-7P if no response, please call [phone] After hours, please call ELink [phone]

## 2021-10-15 NOTE — Assessment & Plan Note (Signed)
Diagnosed within the last 6 months - Continue heparin

## 2021-10-15 NOTE — Assessment & Plan Note (Signed)
Treated and resolved 

## 2021-10-15 NOTE — Progress Notes (Signed)
Progress Note  Patient Name: Jillian Cooley Date of Encounter: 10/15/2021  [facility] Cardiologist: Werner Lean, MD   Subjective   Denies shortness of breath and denies anginal or pleuritic chest pain. Good urine output with net diuresis of 1.4 L.  BUN shows slight improvement, creatinine unchanged since yesterday.  Weight unchanged from yesterday, down roughly 5 pounds from peak weight during this admission. Potassium 3.0.  Inpatient Medications    Scheduled Meds:  aspirin EC  81 mg Oral Daily   atorvastatin  20 mg Oral Daily   Chlorhexidine Gluconate Cloth  6 each Topical Daily   famotidine  20 mg Oral Daily   furosemide  60 mg Intravenous BID   hydrALAZINE  10 mg Oral Q8H   hydrocortisone sod succinate (SOLU-CORTEF) inj  50 mg Intravenous Daily   mouth rinse  15 mL Mouth Rinse BID   multivitamin with minerals  1 tablet Oral Daily   potassium chloride  40 mEq Oral BID   sodium chloride flush  3 mL Intravenous Q12H   sodium chloride flush  3 mL Intravenous Q12H   Continuous Infusions:  sodium chloride Stopped (10/10/21 1902)   sodium chloride Stopped (10/14/21 1936)   sodium chloride     heparin     PRN Meds: sodium chloride, sodium chloride, acetaminophen, docusate sodium, ondansetron (ZOFRAN) IV, polyethylene glycol, sodium chloride flush   Vital Signs    Vitals:   10/15/21 0800 10/15/21 0900 10/15/21 1000 10/15/21 1200  BP: 111/62  116/60   Pulse: 84 87 89 89  Resp: (!) $RemoveB'24 17 20 14  'ApqpNUNp$ Temp: (!) 97.4 F (36.3 C)   (!) 97.4 F (36.3 C)  TempSrc: Oral   Oral  SpO2: 99% 100% 100% 97%  Weight:      Height:        Intake/Output Summary (Last 24 hours) at 10/15/2021 1318 Last data filed at 10/15/2021 1200 Gross per 24 hour  Intake 453.99 ml  Output 1425 ml  Net -971.01 ml   Last 3 Weights 10/15/2021 10/14/2021 10/12/2021  Weight (lbs) 118 lb 9.7 oz 118 lb 9.7 oz 122 lb 9.2 oz  Weight (kg) 53.8 kg 53.8 kg 55.6 kg      Telemetry     Sinus rhythm with occasional blocked PACs, no significant bradycardia or pauses.- Personally Reviewed  ECG    No new tracing- Personally Reviewed  Physical Exam  Appears comfortable, but elderly and rather frail GEN: No acute distress.   Neck: No JVD Cardiac: RRR, no murmurs, rubs, or gallops.  Respiratory: Clear to auscultation bilaterally. GI: Soft, nontender, non-distended  MS: No edema; No deformity. Neuro:  Nonfocal  Psych: Normal affect   Labs    High Sensitivity Troponin:   Recent Labs  Lab 10/10/21 0731 10/10/21 0935 10/10/21 1548 10/10/21 1822 10/12/21 0252  TROPONINIHS 523* 836* 3,307* 5,378* 5,247*     Chemistry Recent Labs  Lab 10/10/21 0731 10/10/21 0731 10/10/21 1548 10/11/21 0333 10/12/21 0252 10/13/21 0258 10/13/21 1437 10/14/21 0456 10/15/21 0255  NA 136  --  138 138 138 138 141  143 141 135  K 4.6  --  4.2 4.1 3.8 3.3* 3.5  3.3* 4.1 3.0*  CL 103  --  107 104 104 104  --  108 103  CO2 19*  --  18* $Re'24 23 23  'wZB$ --  23 23  GLUCOSE 322*  --  105* 152* 133* 115*  --  126* 111*  BUN 74*  --  73* 79* 83* 88*  --  86* 76*  CREATININE 2.43*  --  1.93* 2.53* 2.80* 2.48*  --  2.12* 2.11*  CALCIUM 9.6  --  9.0 8.9 8.9 8.7*  --  8.8* 8.1*  MG  --    < > 2.3 2.2 2.3  --   --  2.3  --   PROT 6.9  --  6.0*  --  5.8*  --   --   --   --   ALBUMIN 3.8  --  3.4*  --  3.2*  --   --   --   --   AST 33  --  32  --  16  --   --   --   --   ALT 34  --  30  --  25  --   --   --   --   ALKPHOS 56  --  45  --  40  --   --   --   --   BILITOT 0.7  --  0.8  --  0.7  --   --   --   --   GFRNONAA 19*  --  25* 18* 16* 18*  --  22* 22*  ANIONGAP 14  --  $R'13 10 11 11  'EM$ --  10 9   < > = values in this interval not displayed.    Lipids  Recent Labs  Lab 10/12/21 0252  CHOL 141  TRIG 61  HDL 45  LDLCALC 84  CHOLHDL 3.1    Hematology Recent Labs  Lab 10/13/21 0258 10/13/21 1437 10/14/21 0456 10/15/21 0255  WBC 7.5  --  5.8 7.3  RBC 3.40*  --  3.41* 3.29*  HGB  9.9* 10.9*  10.2* 10.1* 9.7*  HCT 31.6* 32.0*  30.0* 32.1* 31.2*  MCV 92.9  --  94.1 94.8  MCH 29.1  --  29.6 29.5  MCHC 31.3  --  31.5 31.1  RDW 15.0  --  14.8 15.0  PLT 191  --  176 182   Thyroid No results for input(s): TSH, FREET4 in the last 168 hours.  BNP Recent Labs  Lab 10/10/21 0731  BNP 1,070.9*    DDimer No results for input(s): DDIMER in the last 168 hours.   Radiology    CARDIAC CATHETERIZATION  Result Date: 10/13/2021 1. Right and left heart filling pressures are elevated. 2. Prominent v-wave suggestive of significant mitral regurgitation. 3. Cardiac output is normal. 4. Mixed pulmonary venous/pulmonary arterial HTN    Cardiac Studies   Echocardiogram 10/10/2021      1. Normal LV function; mild AI; severe MR; elevated mean gradient across  MV of 5 mmHg likely at least partially related to severe MR; moderate TR.   2. Left ventricular ejection fraction, by estimation, is 60 to 65%. The  left ventricle has normal function. The left ventricle has no regional  wall motion abnormalities. Left ventricular diastolic parameters are  indeterminate.   3. Right ventricular systolic function is normal. The right ventricular  size is normal. There is moderately elevated pulmonary artery systolic  pressure.   4. Left atrial size was moderately dilated.   5. Large pleural effusion in the left lateral region.   6. The mitral valve is abnormal. Severe mitral valve regurgitation. Mild  mitral stenosis.   7. Tricuspid valve regurgitation is moderate.   8. The aortic valve has an indeterminant number of cusps. Aortic valve  regurgitation is mild. No  aortic stenosis is present.   9. The inferior vena cava is normal in size with <50% respiratory  variability, suggesting right atrial pressure of 8 mmHg.        Right heart catheterization 10/14/2021   Conclusion   1. Right and left heart filling pressures are elevated.  2. Prominent v-wave suggestive of significant  mitral regurgitation.  3. Cardiac output is normal.  4. Mixed pulmonary venous/pulmonary arterial HTN   Right Heart   Right Heart Pressures RHC Procedural Findings: Hemodynamics (mmHg) RA mean 11 RV 66/9 PA 74/29, mean 49 PCWP mean 25, prominent V-waves to 37  Oxygen saturations: PA 62% AO 100%  Cardiac Output (Fick) 4.1  Cardiac Index (Fick) 2.6 PVR 5.8 WU    Patient Profile     87 y.o. female with HTN, HLD, valvular heart disease (mod MR and mild AI 04/2021), CKD stage 4, carpal tunnel, bilateral DVT/PE, pleural effusion s/p thoracentesis, multiple myeloma. Underwent rotator cuff surgery 03/4314 complicated by bilateral PE/DVT, discharged on O2. She was also recently diagnosed with multiple myeloma and not felt to be a transplant candidate; planned for chemotherapy. She was admitted 10/10/2021 with worsening dyspnea/chest tightness found to be hypoxic despite 4L and required BiPAP. She had lactic acidosis and hypotension, treated with IVF and stress dose steroids. She was found to have markedly increased large left pleural effusion and small right pleural effusion, AKI on CKD stage 3b, elevated troponin, and severe mitral regurgitation. 2D echo 10/21 EF 60-65%, mild AI, severe MR, moderate TR. She underwent L sided thoracentesis on 10/21 (1.5L) and repeat L sided thoracentesis on 10/23 (1.2L). Cytology pending, listed as collected from 10/21.  Right heart catheterization performed 10/13/2021 showed mixed findings of acute left heart failure and intrinsic pulmonary artery hypertension (consider secondary to pulm embolism).  Assessment & Plan    Acute diastolic heart failure: Secondary at least in large part to severe mitral insufficiency.  Mean PA wedge at cardiac catheterization yesterday was 25 mmHg with prominent V waves.  Received intravenous diuretics yesterday and again this morning.  Still orthopneic.  Renal function improving with diuresis.  Continue diuretics until she is able to  lie fully flat as long as renal function allows.  In May her BNP was elevated at 530, on this admission was over 1000.  Little room for treatment with vasodilators since her diastolic blood pressure is sometimes in the 50s.  Unable to use RAAS inhibitors due to renal dysfunction.  We will recheck BNP tomorrow, switch to oral diuretic starting tomorrow, replace potassium.  Discussed signs and symptoms of heart for exacerbation, the importance of sodium restricted diet, daily weight monitoring. Mitral insufficiency: Described as moderate on outside echocardiogram performed in February is unable to review images), moderate (but possibly underestimated) by echo in May.  The transthoracic echocardiogram does not show images of the mitral valve with sufficient resolution to make a diagnosis, but the general impression is that the leaflets are thickened and somewhat restricted (possibly rheumatic disease).  Would need a transesophageal echo to assess them better.  If the initial impression is correct, unfortunately this would exclude MitraClip repair.  She is not a surgical candidate for mitral valve repair/replacement.  Try to optimize vasodilator therapy, but blood pressure is a big limiting factor.  We will try a very low-dose of hydralazine. PAH: Mixed etiology due to left heart failure (primarily due to mitral regurgitation) and intrinsic pulmonary arterial disease (possibly sequelae of previous pulmonary embolism).  Note that she had evidence  of moderate to severe pulmonary hypertension by echocardiogram performed in February. Acute on chronic respiratory failure with hypoxia: Improved with thoracentesis and diuresis. NSTEMI: She does not have any regional wall motion abnormalities on the echocardiogram, but it is quite possible that she has underlying CAD based on the degree of increase in cardiac enzymes.  She is not a candidate for invasive angiography and percutaneous revascularization or surgery.  It is  quite reasonable to treat her with aggressive lipid-lowering therapy.  Recommend aspirin 81 mg daily, but this should be stopped when she restarts treatment with Eliquis for her pulmonary embolism. PE: Although this occurred in the setting of postoperative state, it is very likely that she has a hypercoagulable condition due to the hematological disorder.  Probably will remain on lifelong anticoagulation. Sinus pauses: No significant bradycardia.  Mostly due to blocked PACs. Chronic kidney disease stage IV: Renal parameters back to normal  For questions or updates, please contact [place] Please consult [email or web address] for contact info under        Signed, Sanda Klein, MD  10/15/2021, 1:18 PM

## 2021-10-15 NOTE — Assessment & Plan Note (Addendum)
Net -1.4 L yesterday, only 600 cc on admission.  This is largely driven by severe mitral regurgitation -Continue furosemide, cardiology have changed to oral -K supplement -Strict I/Os, daily weights, telemetry  -Daily monitoring renal function -Consult cardiology, appreciate cares, possible plan for TEE soon

## 2021-10-15 NOTE — Progress Notes (Signed)
Progress Note    Jillian Cooley   DPO:[id number]  DOB: June 23, 1932  DOA: 10/10/2021     5 Date of Service: 10/15/2021     Brief summary: 87 year old female, lives with spouse, independent, on as needed home oxygen, medical history significant for hospitalization in May 2022 for right rotator cuff repair complicated by acute hypoxia and found to have bilateral DVT, PE and left pleural effusion, started on Eliquis, underwent left thoracentesis of 800 mL, recurrent left pleural effusion in September 2022 and again underwent thoracentesis with transudative effusion, now readmitted with acute respiratory failure with hypoxia requiring BiPAP in ED.  Admitted to ICU by CCM for acute on chronic hypoxic respiratory failure due to large recurrent left pleural effusion, hypotension, acute on chronic diastolic CHF, severe MR, pulmonary hypertension and NSTEMI.  S/p 1.5 L left thoracentesis.  Remains on IV heparin drip.  Cardiology consulting.  Possible right heart cath 10/24.  Medical history also significant for stage IV chronic kidney disease, HTN, HLD, mitral regurgitation.  S/p 1.2 L left thoracentesis 10/23.  S/p RHC 10/14.         Assessment and Plan * Acute on chronic respiratory failure with hypoxia (HCC) Uses O2 "as needed" at home due to chron res failure due to CHF.   Here due to severe mitral regurgitation and pleural effusion due to mitral regurgitation  Pleural effusion, left Chest x-ray today personally reviewed by me, substantially larger after thoracentesis 3 days ago.  Of note, multiple myeloma does not cause pleural effusion.  Even with this pleural effusion and her congestive heart failure, although the dose of dexamethasone may have to be adjusted, she can proceed with therapy.  Oncology will see the patient. - Continue diuresis - Repeat thoracentesis as needed  PAH (pulmonary artery hypertension) ([place]) - See above  Severe mitral regurgitation - Continue Lasix -  Continue new hydralazine  Acute on chronic diastolic CHF (congestive heart failure) (HCC) Net -1.4 L yesterday, only 600 cc on admission.  This is largely driven by severe mitral regurgitation -Continue furosemide, cardiology have changed to oral -K supplement -Strict I/Os, daily weights, telemetry  -Daily monitoring renal function -Consult cardiology, appreciate cares, possible plan for TEE soon    Non-ST elevation (NSTEMI) myocardial infarction [facility]) Doubt ACS, likely demand ischemia in the setting of severe mitral regurgitation -Continue aspirin, atorvastatin  Acute renal failure superimposed on stage 4 chronic kidney disease (HCC) Creatinine improving today with diuresis  Bilateral pulmonary embolism (HCC) Diagnosed within the last 6 months - Continue heparin  Pressure injury of coccygeal region, stage 2 ([place])    Multiple myeloma not having achieved remission ([place]) Discussed with [name], may be able to start chemo soon, despite her mitral regurgitation and CHF  Normocytic anemia Hemoglobin stable  Essential hypertension - Continue furosemide, hydralazine  Mobitz type 1 second degree AV block Replete potassium - Consult cardiology  Hypokalemia - Supplement potassium  Hyperphosphatemia     Protein-calorie malnutrition, severe As evidenced by severe subcutaneous loss of muscle mass and fat  Hypotension, unspecified Blood pressure stabilized     Subjective:  Less orthopnea today but still some.  Still short of breath with exertion.  Leg swelling is improved.  No chest pain.  No confusion.  No fever.  No sob hemoptysis.  Objective Vitals:   10/15/21 1415 10/15/21 1500 10/15/21 1600 10/15/21 1700  BP: (!) 124/54  118/60   Pulse:  87 79 81  Resp:  19 (!) 21 16  Temp:   Marland Kitchen)  97.5 F (36.4 C)   TempSrc:   Axillary   SpO2:  95% 96% 100%  Weight:      Height:       53.8 kg  Vital signs were reviewed and unremarkable except for: Weaning oxygen to 2  L, respiratory rate increased with exertion.   Exam General appearance: Elderly adult female, sitting up in chair, no acute distress, interactive     HEENT: Nasal cannula in place, sclera anicteric, conjunctive and lids and lashes normal.  No nasal deformity, discharge, or epistaxis. Skin: Suspicious rashes or lesions. Cardiac: Heart rate normal, systolic murmur, soft, JVP not visible, no lower extremity edema Respiratory: Normal respiratory rate and rhythm, crackles at bilateral bases, diminished on the left. Abdomen:   MSK: Reduced subcutaneous muscle mass and fat diffusely, severe.  No deformities or effusions. Neuro: Awake and alert, extraocular movements intact, moves upper extremities with generalized weakness but symmetric strength, speech fluent, face symmetric. Psych: Attention normal, affect normal, judgment insight appear normal    Labs / Other Information My review of labs, imaging, notes and other tests is significant for Hypokalemia, improving renal function, worsening effusion   Disposition Plan: Status is: Inpatient  Remains inpatient appropriate because: She has severe mitral regurgitation requiring ongoing work-up with TEE        Time spent: 35 minutes  [facility] 10/15/2021, 7:09 PM

## 2021-10-15 NOTE — Assessment & Plan Note (Signed)
Discussed with [name], may be able to start chemo soon, despite her mitral regurgitation and CHF

## 2021-10-15 NOTE — Progress Notes (Signed)
ANTICOAGULATION CONSULT NOTE - follow up  Pharmacy Consult for Heparin (apixaban PTA) Indication: chest pain/ACS  Allergies  Allergen Reactions   Sulfa Antibiotics Nausea Only    Patient Measurements: Height: 5\' 3"  (160 cm) Weight: 53.8 kg (118 lb 9.7 oz) IBW/kg (Calculated) : 52.4 Heparin Dosing Weight: TBW  Vital Signs: Temp: 97.7 F (36.5 C) (10/26 1950) Temp Source: Oral (10/26 1950) BP: 142/67 (10/26 2200) Pulse Rate: 88 (10/26 2200)  Labs: Recent Labs    10/13/21 0258 10/13/21 1437 10/14/21 0456 10/14/21 1505 10/14/21 2351 10/15/21 0255 10/15/21 0955 10/15/21 2113  HGB 9.9* 10.9*  10.2* 10.1*  --   --  9.7*  --   --   HCT 31.6* 32.0*  30.0* 32.1*  --   --  31.2*  --   --   PLT 191  --  176  --   --  182  --   --   APTT 47*  --  65* 64* 113*  --   --   --   HEPARINUNFRC 1.04*  --  0.69  --  0.85*  --  0.74* 0.61  CREATININE 2.48*  --  2.12*  --   --  2.11*  --   --      Estimated Creatinine Clearance: 15.5 mL/min (A) (by C-G formula based on SCr of 2.11 mg/dL (H)).   Medical History: Past Medical History:  Diagnosis Date   Abnormal EKG    Carpal tunnel syndrome    CKD (chronic kidney disease), stage III (HCC)    DDD (degenerative disc disease), lumbar    Hypertension    Hypertension with renal disease    Menopause    Mixed hyperlipidemia    Muscular degeneration    Nonrheumatic aortic (valve) insufficiency    Nonrheumatic mitral valve regurgitation    Osteoarthritis    Pedal edema    Pleural effusion    Skin cancer    Vitamin D deficiency     Medications:  Scheduled:   aspirin EC  81 mg Oral Daily   atorvastatin  20 mg Oral Daily   Chlorhexidine Gluconate Cloth  6 each Topical Daily   famotidine  20 mg Oral Daily   [START ON 10/16/2021] furosemide  80 mg Oral Daily   hydrALAZINE  10 mg Oral Q8H   hydrocortisone sod succinate (SOLU-CORTEF) inj  50 mg Intravenous Daily   mouth rinse  15 mL Mouth Rinse BID   multivitamin with minerals   1 tablet Oral Daily   sodium chloride flush  3 mL Intravenous Q12H   Infusions:   sodium chloride Stopped (10/10/21 1902)   heparin 850 Units/hr (10/15/21 1736)    Assessment: 75 yoF presented to ED with ShOB.  Heparin per pharmacy for ACS, hx PE/DVT on apixaban.  PTA Eliquis held, last dose 10/20 at 18:00 Troponin: 523, 836 >> 5378 CBC:  Hgb 11.5, Plt WNL Baseline aPTT 29 seconds, INR 1.7  Heparin turned off for RHC 10/24, pharmacy asked to resume now post-procedure.  10/15/2021: Heparin level 0.74, elevated but decreasing after previous rate decrease. CBC- Hg low but stable, pltc WNL No bleeding or infusion related concerns per RN.    Evening follow-up: 21:13 HL = 0.61 (therapeutic) with heparin gtt @ 850 units/hr No complications of therapy noted  Goal of Therapy:  Heparin level 0.3-0.7 units/ml aPTT 66-102 seconds Monitor platelets by anticoagulation protocol: Yes   Plan:  Continue heparin @ 850 units/hr. Daily heparin level and CBC while on heparin F/U  plans to resume apixaban  Leone Haven, PharmD 10/15/2021 10:53 PM

## 2021-10-15 NOTE — TOC Initial Note (Signed)
Transition of Care [facility]) - Initial/Assessment Note    Patient Details  Name: Jillian Cooley MRN: [id number] Date of Birth: 1932-06-04  Transition of Care [facility]) CM/SW Contact:    Leeroy Cha, RN Phone Number: 10/15/2021, 8:36 AM  Clinical Narrative:                 87 year old female, lives with spouse, independent, on as needed home oxygen, medical history significant for hospitalization in May 2022 for right rotator cuff repair complicated by acute hypoxia and found to have bilateral DVT, PE and left pleural effusion, started on Eliquis, underwent left thoracentesis of 800 mL, recurrent left pleural effusion in September 2022 and again underwent thoracentesis with transudative effusion, now readmitted with acute respiratory failure with hypoxia requiring BiPAP in ED.  Admitted to ICU by CCM for acute on chronic hypoxic respiratory failure due to large recurrent left pleural effusion, hypotension, acute on chronic diastolic CHF, severe MR, pulmonary hypertension and NSTEMI.  S/p 1.5 L left thoracentesis.  Remains on IV heparin drip.  Cardiology consulting.  Possible right heart cath 10/24.  Medical history also significant for stage IV chronic kidney disease, HTN, HLD, mitral regurgitation.  S/p 1.2 L left thoracentesis 10/23.  S/p RHC 10/14. TOC PLAN OF CARE: Pt is from home with spouse. Plan is to return to home.  Following for toc and progression needs. Expected Discharge Plan: Home/Self Care Barriers to Discharge: Continued Medical Work up   Patient Goals and CMS Choice Patient states their goals for this hospitalization and ongoing recovery are:: to go home CMS Medicare.gov Compare Post Acute Care list provided to:: Patient    Expected Discharge Plan and Services Expected Discharge Plan: Home/Self Care   Discharge Planning Services: CM Consult   Living arrangements for the past 2 months: Single Family Home                                      Prior Living  Arrangements/Services Living arrangements for the past 2 months: Single Family Home Lives with:: Spouse Patient language and need for interpreter reviewed:: Yes Do you feel safe going back to the place where you live?: Yes            Criminal Activity/Legal Involvement Pertinent to Current Situation/Hospitalization: No - Comment as needed  Activities of Daily Living Home Assistive Devices/Equipment: Eyeglasses, Oxygen ADL Screening (condition at time of admission) Patient's cognitive ability adequate to safely complete daily activities?: Yes Is the patient deaf or have difficulty hearing?: No Does the patient have difficulty seeing, even when wearing glasses/contacts?: Yes (has macular degeneration) Does the patient have difficulty concentrating, remembering, or making decisions?: No Patient able to express need for assistance with ADLs?: Yes Does the patient have difficulty dressing or bathing?: Yes (secondary to shortness of breath and weakness) Independently performs ADLs?: No (secondary to shortness of breath and weakness) Communication: Independent Dressing (OT): Needs assistance Is this a change from baseline?: Change from baseline, expected to last >3 days Grooming: Needs assistance Is this a change from baseline?: Change from baseline, expected to last >3 days Feeding: Needs assistance Is this a change from baseline?: Change from baseline, expected to last >3 days Bathing: Needs assistance Is this a change from baseline?: Change from baseline, expected to last >3 days Toileting: Needs assistance Is this a change from baseline?: Change from baseline, expected to last >3days In/Out Bed: Needs assistance Is  this a change from baseline?: Change from baseline, expected to last >3 days Walks in Home: Needs assistance Is this a change from baseline?: Change from baseline, expected to last >3 days Does the patient have difficulty walking or climbing stairs?: Yes (secondary to  shortness of breath and weakness) Weakness of Legs: Both Weakness of Arms/Hands: None  Permission Sought/Granted                  Emotional Assessment Appearance:: Appears stated age     Orientation: : Oriented to Self, Oriented to Place, Oriented to  Time, Oriented to Situation Alcohol / Substance Use: Not Applicable Psych Involvement: No (comment)  Admission diagnosis:  Acidosis [E87.20] Acute respiratory failure (HCC) [J96.00] Status post thoracentesis [Z98.890] Pleural effusion on left [J90] Pleural effusion, left [J90] Acute respiratory failure, unspecified whether with hypoxia or hypercapnia (HCC) [J96.00] Patient Active Problem List   Diagnosis Date Noted   Hypotension, unspecified 10/15/2021   Hyperphosphatemia 10/15/2021   Hypokalemia 10/15/2021   Mobitz type 1 second degree AV block 10/15/2021   Severe mitral regurgitation    PAH (pulmonary artery hypertension) ([place])    Non-ST elevation (NSTEMI) myocardial infarction [facility])    Acute renal failure superimposed on stage 4 chronic kidney disease ([place])    Acute on chronic diastolic CHF (congestive heart failure) (HCC)    Pressure injury of coccygeal region, stage 2 ([place]) 10/12/2021   Acute on chronic respiratory failure with hypoxia ([place]) 10/10/2021   Multiple myeloma not having achieved remission ([place]) 09/30/2021   Normocytic anemia 09/18/2021   Monoclonal gammopathy 09/18/2021   Pleural effusion, left    Absolute anemia 08/12/2021   Chronic anticoagulation 08/12/2021   Bilateral pulmonary embolism ([place]) 06/18/2021   DVT, bilateral lower limbs ([place]) 06/18/2021   S/P right rotator cuff repair 04/23/2021   S/P arthroscopy of shoulder 04/23/2021   CHF (congestive heart failure) ([place])    Nonrheumatic mitral valve regurgitation 02/21/2021   Nonrheumatic tricuspid valve regurgitation 02/21/2021   Nonrheumatic aortic valve insufficiency 02/21/2021   Preoperative cardiovascular examination 02/21/2021   Essential  hypertension 02/21/2021   Herniated nucleus pulposus, lumbar 06/13/2012   PCP:  [name], MD Pharmacy:   [id number] - [place], [address] [place] [address] Phone: [phone] Fax: [phone]     Social Determinants of Health (SDOH) Interventions    Readmission Risk Interventions No flowsheet data found.

## 2021-10-15 NOTE — Assessment & Plan Note (Signed)
Replete potassium - Consult cardiology

## 2021-10-15 NOTE — Progress Notes (Signed)
Initial Nutrition Assessment  DOCUMENTATION CODES:   Severe malnutrition in context of chronic illness  INTERVENTION:  - will order Magic Cup TID with meals, each supplement provides 290 kcal and 9 grams of protein. - will order 1 tablet multivitamin with minerals/day.   NUTRITION DIAGNOSIS:   Severe Malnutrition related to chronic illness as evidenced by severe fat depletion, severe muscle depletion.  GOAL:   Patient will meet greater than or equal to 90% of their needs  MONITOR:   PO intake, Supplement acceptance, Labs, Weight trends  REASON FOR ASSESSMENT:   Consult Assessment of nutrition requirement/status  ASSESSMENT:   87 year old female with medical history of skin cancer, pleural effusion, HTN, mixed HLD, vitamin D deficiency, DDD, osteoarthritis, carpal tunnel syndrome, and stage 3-4 CKD. She was admitted due to chronic hypoxic respiratory failure d/t large recurrent L pleural effusion, hypotension, acute on chronic CHF, severe MR, pulmonary HTN, and NSTEMI. She underwent left thoracentesis on 10/23 with 1.5 L removed.  Patient sitting in chart with family at bedside. Patient reports have grapefruit juice and bites of oatmeal and french toast for breakfast. She reports that for the past ~3 months she has had decreased appetite and experiencing early satiety. Often feels full after only a few bites. She feels liquids cause this less readily as solid foods.  She finds chewing and swallowing dry items to be difficult as her mouth is often dry and items then feel like they get stuck in her throat. Otherwise no difficulty or pain with chewing or swallowing.   No overt difficulty with ambulation but does get very short of breath. Does get short of breath at times when eating.   She has tried Ensure in the past and does not like it. Discussed Magic Cup and she is very interested in trying this supplement.   Weight today is 119 lb and weight on 8/23 was 130 lb. This  indicates 11 lb weight loss (8% body weight) in the past 2 months; significant for time frame. Patient and family feel this is accurate as she has been losing a significant/noticeable amount of weight in the past few months.    Labs reviewed; K: 3 mmol/l, BUN: 76 mg/dl, creatinine: 2.11 mg/dl, Ca: 8.1 mg/dl, GFR: 22 ml/min.  Medications reviewed; 20 mg oral pepcid/day, 60 mg IV lasix BID, 50 mg solu-cortef/day, 40 mEq Klor-Con BID.    NUTRITION - FOCUSED PHYSICAL EXAM:  Flowsheet Row Most Recent Value  Orbital Region Moderate depletion  Upper Arm Region Severe depletion  Thoracic and Lumbar Region Unable to assess  Buccal Region Severe depletion  Temple Region Severe depletion  Clavicle Bone Region Moderate depletion  Clavicle and Acromion Bone Region Severe depletion  Scapular Bone Region Severe depletion  Dorsal Hand Severe depletion  Patellar Region Moderate depletion  Anterior Thigh Region Moderate depletion  Posterior Calf Region Moderate depletion  Edema (RD Assessment) Moderate  Hair Reviewed  Eyes Reviewed  Mouth Reviewed  Skin Reviewed  Nails Reviewed       Diet Order:   Diet Order             Diet Heart Room service appropriate? Yes; Fluid consistency: Thin  Diet effective now                   EDUCATION NEEDS:   Education needs have been addressed  Skin:  Skin Assessment: Reviewed RN Assessment  Last BM:  10/24  Height:   Ht Readings from Last 1 Encounters:  10/10/21  5\' 3"  (1.6 m)    Weight:   Wt Readings from Last 1 Encounters:  10/15/21 53.8 kg    Estimated Nutritional Needs:  Kcal:  1500-1700 kcal Protein:  75-85 grams Fluid:  >/= 1.3 L/day     Jarome Matin, MS, RD, LDN, CNSC Inpatient Clinical Dietitian RD pager # available in AMION  After hours/weekend pager # available in [facility]

## 2021-10-15 NOTE — Telephone Encounter (Signed)
Please arrange for hospital follow up with Dr. Silas Flood in 4 weeks.  She will need consideration for repeat chest xray to assess pleural effusion.     Noe Gens, MSN, APRN, NP-C, AGACNP-BC [facility] 10/15/2021, 1:10 PM   Please see [email or web address] for pager details.   From 7A-7P if no response, please call [phone] After hours, please call ELink [phone]

## 2021-10-15 NOTE — Telephone Encounter (Signed)
ATC LVMTCB X

## 2021-10-16 DIAGNOSIS — I5033 Acute on chronic diastolic (congestive) heart failure: Secondary | ICD-10-CM | POA: Diagnosis not present

## 2021-10-16 DIAGNOSIS — I2721 Secondary pulmonary arterial hypertension: Secondary | ICD-10-CM | POA: Diagnosis not present

## 2021-10-16 DIAGNOSIS — I340 Nonrheumatic mitral (valve) insufficiency: Secondary | ICD-10-CM | POA: Diagnosis not present

## 2021-10-16 DIAGNOSIS — J9601 Acute respiratory failure with hypoxia: Secondary | ICD-10-CM | POA: Diagnosis not present

## 2021-10-16 LAB — BASIC METABOLIC PANEL
Anion gap: 7 (ref 5–15)
BUN: 76 mg/dL — ABNORMAL HIGH (ref 8–23)
CO2: 25 mmol/L (ref 22–32)
Calcium: 8.6 mg/dL — ABNORMAL LOW (ref 8.9–10.3)
Chloride: 106 mmol/L (ref 98–111)
Creatinine, Ser: 2.01 mg/dL — ABNORMAL HIGH (ref 0.44–1.00)
GFR, Estimated: 24 mL/min — ABNORMAL LOW (ref >60)
Glucose, Bld: 118 mg/dL — ABNORMAL HIGH (ref 70–99)
Potassium: 4.5 mmol/L (ref 3.5–5.1)
Sodium: 138 mmol/L (ref 135–145)

## 2021-10-16 LAB — CBC
HCT: 32.8 % — ABNORMAL LOW (ref 36.0–46.0)
Hemoglobin: 10.3 g/dL — ABNORMAL LOW (ref 12.0–15.0)
MCH: 29.7 pg (ref 26.0–34.0)
MCHC: 31.4 g/dL (ref 30.0–36.0)
MCV: 94.5 fL (ref 80.0–100.0)
Platelets: 193 10*3/uL (ref 150–400)
RBC: 3.47 MIL/uL — ABNORMAL LOW (ref 3.87–5.11)
RDW: 15 % (ref 11.5–15.5)
WBC: 7.6 10*3/uL (ref 4.0–10.5)
nRBC: 0 % (ref 0.0–0.2)

## 2021-10-16 LAB — BRAIN NATRIURETIC PEPTIDE: B Natriuretic Peptide: 1121 pg/mL — ABNORMAL HIGH (ref 0.0–100.0)

## 2021-10-16 LAB — HEPARIN LEVEL (UNFRACTIONATED): Heparin Unfractionated: 0.59 IU/mL (ref 0.30–0.70)

## 2021-10-16 MED ORDER — HYDRALAZINE HCL 25 MG PO TABS
25.0000 mg | ORAL_TABLET | Freq: Three times a day (TID) | ORAL | Status: DC
  Administered 2021-10-16 – 2021-10-21 (×14): 25 mg via ORAL
  Filled 2021-10-16 (×14): qty 1

## 2021-10-16 MED ORDER — SODIUM CHLORIDE 0.9 % IV SOLN: INTRAVENOUS | Status: DC

## 2021-10-16 MED ORDER — FUROSEMIDE 10 MG/ML IJ SOLN
80.0000 mg | Freq: Two times a day (BID) | INTRAMUSCULAR | Status: DC
  Administered 2021-10-16 – 2021-10-18 (×3): 80 mg via INTRAVENOUS
  Filled 2021-10-16 (×3): qty 8

## 2021-10-16 MED ORDER — POTASSIUM CHLORIDE CRYS ER 20 MEQ PO TBCR
20.0000 meq | EXTENDED_RELEASE_TABLET | Freq: Three times a day (TID) | ORAL | Status: DC
  Administered 2021-10-16 – 2021-10-17 (×4): 20 meq via ORAL
  Filled 2021-10-16 (×4): qty 1

## 2021-10-16 NOTE — Assessment & Plan Note (Addendum)
Resolved

## 2021-10-16 NOTE — Assessment & Plan Note (Signed)
Hgb no change 

## 2021-10-16 NOTE — Assessment & Plan Note (Signed)
Asymptomatic. 

## 2021-10-16 NOTE — Assessment & Plan Note (Signed)
May Echo actually was read as mild to moderate MR.  Now seems worse.  - Continue Lasix - Continue new hydralazine

## 2021-10-16 NOTE — Assessment & Plan Note (Signed)
Diagnosed this summer in the context of worsening anemia.  Discussed with [name].  Treatment should wait until after her effusion is stabilized.

## 2021-10-16 NOTE — Assessment & Plan Note (Signed)
BP stable - Continue furosemide, hydralazine

## 2021-10-16 NOTE — Assessment & Plan Note (Signed)
Net even yesterday, net neg 500 cc on admission. Cr stable, K low. -Continue Furosemide, back to IV today - Consult Cardiology, appreciate cares - Transfer to [facility] for TEE and structural heart possibly -Continue K  -Strict I/Os, daily weights, telemetry  -Daily monitoring renal function

## 2021-10-16 NOTE — Assessment & Plan Note (Signed)
See above

## 2021-10-16 NOTE — Progress Notes (Signed)
Progress Note  Patient Name: Jillian Cooley Date of Encounter: 10/16/2021  [facility] Cardiologist: Christell Constant, MD   Subjective    Clearly more short of breath.  Chest x-ray yesterday afternoon shows reaccumulation of a large left pleural effusion. In and out did not appear to be accurate. Weight has been essentially unchanged for the last 3 days.  Inpatient Medications    Scheduled Meds:  aspirin EC  81 mg Oral Daily   atorvastatin  20 mg Oral Daily   Chlorhexidine Gluconate Cloth  6 each Topical Daily   famotidine  20 mg Oral Daily   furosemide  80 mg Intravenous Q12H   hydrALAZINE  25 mg Oral Q8H   hydrocortisone sod succinate (SOLU-CORTEF) inj  50 mg Intravenous Daily   mouth rinse  15 mL Mouth Rinse BID   multivitamin with minerals  1 tablet Oral Daily   potassium chloride  20 mEq Oral TID   sodium chloride flush  3 mL Intravenous Q12H   Continuous Infusions:  sodium chloride Stopped (10/10/21 1902)   heparin 850 Units/hr (10/15/21 1736)   PRN Meds: sodium chloride, acetaminophen, docusate sodium, ondansetron (ZOFRAN) IV, polyethylene glycol   Vital Signs    Vitals:   10/16/21 0200 10/16/21 0400 10/16/21 0600 10/16/21 0800  BP: 123/74 135/69 131/68   Pulse: 81 83 88   Resp: (!) 29 16 (!) 22   Temp:  (!) 97.5 F (36.4 C)  97.9 F (36.6 C)  TempSrc:  Oral  Oral  SpO2: 100% 100% 95%   Weight:  53.6 kg    Height:        Intake/Output Summary (Last 24 hours) at 10/16/2021 1116 Last data filed at 10/16/2021 0500 Gross per 24 hour  Intake 400.23 ml  Output 400 ml  Net 0.23 ml   Last 3 Weights 10/16/2021 10/15/2021 10/14/2021  Weight (lbs) 118 lb 2.7 oz 118 lb 9.7 oz 118 lb 9.7 oz  Weight (kg) 53.6 kg 53.8 kg 53.8 kg      Telemetry    NSR - Personally Reviewed  ECG    No new tracing - Personally Reviewed  Physical Exam  Dyspneic, orthopneic GEN: No acute distress.   Neck: 8-9 cm JVD, sitting up Cardiac: RRR,  holosystolic murmurs at apex and LLSB, no diastolic murmurs, rubs, or gallops.  Respiratory: Absent breath sounds L base GI: Soft, nontender, non-distended  MS: No edema; No deformity. Neuro:  Nonfocal  Psych: Normal affect   Labs    High Sensitivity Troponin:   Recent Labs  Lab 10/10/21 0731 10/10/21 0935 10/10/21 1548 10/10/21 1822 10/12/21 0252  TROPONINIHS 523* 836* 3,307* 5,378* 5,247*     Chemistry Recent Labs  Lab 10/10/21 0731 10/10/21 0731 10/10/21 1548 10/11/21 0333 10/12/21 0252 10/13/21 0258 10/14/21 0456 10/15/21 0255 10/16/21 0252  NA 136  --  138 138 138   < > 141 135 138  K 4.6  --  4.2 4.1 3.8   < > 4.1 3.0* 4.5  CL 103  --  107 104 104   < > 108 103 106  CO2 19*  --  18* 24 23   < > 23 23 25   GLUCOSE 322*  --  105* 152* 133*   < > 126* 111* 118*  BUN 74*  --  73* 79* 83*   < > 86* 76* 76*  CREATININE 2.43*  --  1.93* 2.53* 2.80*   < > 2.12* 2.11* 2.01*  CALCIUM 9.6  --  9.0 8.9 8.9   < > 8.8* 8.1* 8.6*  MG  --    < > 2.3 2.2 2.3  --  2.3  --   --   PROT 6.9  --  6.0*  --  5.8*  --   --   --   --   ALBUMIN 3.8  --  3.4*  --  3.2*  --   --   --   --   AST 33  --  32  --  16  --   --   --   --   ALT 34  --  30  --  25  --   --   --   --   ALKPHOS 56  --  45  --  40  --   --   --   --   BILITOT 0.7  --  0.8  --  0.7  --   --   --   --   GFRNONAA 19*  --  25* 18* 16*   < > 22* 22* 24*  ANIONGAP 14  --  $R'13 10 11   'wE$ < > $R'10 9 7   'yF$ < > = values in this interval not displayed.    Lipids  Recent Labs  Lab 10/12/21 0252  CHOL 141  TRIG 61  HDL 45  LDLCALC 84  CHOLHDL 3.1    Hematology Recent Labs  Lab 10/14/21 0456 10/15/21 0255 10/16/21 0252  WBC 5.8 7.3 7.6  RBC 3.41* 3.29* 3.47*  HGB 10.1* 9.7* 10.3*  HCT 32.1* 31.2* 32.8*  MCV 94.1 94.8 94.5  MCH 29.6 29.5 29.7  MCHC 31.5 31.1 31.4  RDW 14.8 15.0 15.0  PLT 176 182 193   Thyroid No results for input(s): TSH, FREET4 in the last 168 hours.  BNP Recent Labs  Lab 10/10/21 0731  10/16/21 0252  BNP 1,070.9* 1,121.0*    DDimer No results for input(s): DDIMER in the last 168 hours.   Radiology    DG CHEST PORT 1 VIEW  Result Date: 10/15/2021 CLINICAL DATA:  LEFT pleural effusion EXAM: PORTABLE CHEST 1 VIEW COMPARISON:  Portable exam 1448 hours compared to 10/12/2021 FINDINGS: Normal heart size and mediastinal contours. Moderate LEFT pleural effusion significantly increased from prior exam. Persistent small RIGHT pleural effusion and basilar atelectasis. Upper lungs clear. No pneumothorax or acute osseous findings. Atherosclerotic calcification aortic arch. IMPRESSION: Bibasilar pleural effusions and atelectasis larger on LEFT, significantly increased on the LEFT since prior study. Aortic Atherosclerosis (ICD10-I70.0). Electronically Signed   By: Lavonia Dana M.D.   On: 10/15/2021 16:44    Cardiac Studies   Echocardiogram 10/10/2021      1. Normal LV function; mild AI; severe MR; elevated mean gradient across  MV of 5 mmHg likely at least partially related to severe MR; moderate TR.   2. Left ventricular ejection fraction, by estimation, is 60 to 65%. The  left ventricle has normal function. The left ventricle has no regional  wall motion abnormalities. Left ventricular diastolic parameters are  indeterminate.   3. Right ventricular systolic function is normal. The right ventricular  size is normal. There is moderately elevated pulmonary artery systolic  pressure.   4. Left atrial size was moderately dilated.   5. Large pleural effusion in the left lateral region.   6. The mitral valve is abnormal. Severe mitral valve regurgitation. Mild  mitral stenosis.   7. Tricuspid valve regurgitation is moderate.   8. The aortic valve  has an indeterminant number of cusps. Aortic valve  regurgitation is mild. No aortic stenosis is present.   9. The inferior vena cava is normal in size with <50% respiratory  variability, suggesting right atrial pressure of 8 mmHg.         Right heart catheterization 10/14/2021   Conclusion   1. Right and left heart filling pressures are elevated.  2. Prominent v-wave suggestive of significant mitral regurgitation.  3. Cardiac output is normal.  4. Mixed pulmonary venous/pulmonary arterial HTN   Right Heart   Right Heart Pressures RHC Procedural Findings: Hemodynamics (mmHg) RA mean 11 RV 66/9 PA 74/29, mean 49 PCWP mean 25, prominent V-waves to 37  Oxygen saturations: PA 62% AO 100%  Cardiac Output (Fick) 4.1  Cardiac Index (Fick) 2.6 PVR 5.8 WU    Patient Profile     87 y.o. female with HTN, HLD, valvular heart disease (mod MR and mild AI 04/2021), CKD stage 4, carpal tunnel, bilateral DVT/PE, pleural effusion s/p thoracentesis, multiple myeloma. Underwent rotator cuff surgery 12/6107 complicated by bilateral PE/DVT, discharged on O2. She was also recently diagnosed with multiple myeloma and not felt to be a transplant candidate; planned for chemotherapy. She was admitted 10/10/2021 with worsening dyspnea/chest tightness found to be hypoxic despite 4L and required BiPAP. She had lactic acidosis and hypotension, treated with IVF and stress dose steroids. She was found to have markedly increased large left pleural effusion and small right pleural effusion, AKI on CKD stage 3b, elevated troponin, and severe mitral regurgitation. 2D echo 10/21 EF 60-65%, mild AI, severe MR, moderate TR. She underwent L sided thoracentesis on 10/21 (1.5L) and repeat L sided thoracentesis on 10/23 (1.2L). Cytology pending, listed as collected from 10/21.  Right heart catheterization performed 10/13/2021 showed mixed findings of acute left heart failure and intrinsic pulmonary artery hypertension (consider secondary to pulm embolism).  Despite diuretic therapy, has rapidly reaccumulating pleural effusions requiring thoracentesis.  Assessment & Plan    Acute diastolic heart failure: Secondary at least in large part to severe mitral  insufficiency.  Mean PA wedge at cardiac catheterization yesterday was 25 mmHg with prominent V waves.  Despite IV diuretics, continues to have rapidly reaccumulating pleural effusion.  Renal function is stable.  In May her BNP was elevated at 530, on this admission was over 1000, now 1121.  Increase hydralazine, but there is little room for treatment with vasodilators since her diastolic blood pressure is sometimes in the 50s.  Unable to use RAAS inhibitors due to renal dysfunction.  We will recheck BNP tomorrow, switch to oral diuretic starting tomorrow, replace potassium.  Discussed signs and symptoms of heart for exacerbation, the importance of sodium restricted diet, daily weight monitoring.  Mitral insufficiency: Described as moderate on outside echocardiogram performed in February (unable to review images), moderate (but possibly underestimated) by echo in May.  The transthoracic echocardiogram does not show images of the mitral valve with sufficient resolution to make a diagnosis, but the general impression is that the leaflets are thickened and somewhat restricted (possibly rheumatic disease).  Would need a transesophageal echo to assess them better.  If the initial impression is correct, unfortunately this would exclude MitraClip repair.  She is not a surgical candidate for mitral valve repair/replacement.  Will try to accelerate her TEE, but would prefer to improve her respiratory status first. Discussed transfer to [facility] w Dr. Loleta Books, unless Oncology believes it is important to restart chemo at [place]. PAH: Mixed etiology due to  left heart failure (primarily due to mitral regurgitation) and intrinsic pulmonary arterial disease (possibly sequelae of previous pulmonary embolism).  Note that she had evidence of moderate to severe pulmonary hypertension by echocardiogram performed in February. Acute on chronic respiratory failure with hypoxia: Improved with thoracentesis and diuresis, but pleural  effusion re-accumulated fast NSTEMI: She does not have any regional wall motion abnormalities on the echocardiogram, but it is quite possible that she has underlying CAD based on the degree of increase in cardiac enzymes.  She is not a candidate for invasive angiography and percutaneous revascularization or surgery.  It is quite reasonable to treat her with aggressive lipid-lowering therapy.  Recommend aspirin 81 mg daily, but this should be stopped when she restarts treatment with Eliquis for her pulmonary embolism. PE: Although this occurred in the setting of postoperative state, it is very likely that she has a hypercoagulable condition due to the hematological disorder.  Probably will remain on lifelong anticoagulation. Sinus pauses: No significant bradycardia.  Mostly due to blocked PACs. Chronic kidney disease stage IV: Renal parameters back to baseline. Multiple myeloma: to discuss plans to resume therapy w he Oncologist.       For questions or updates, please contact [place] Please consult [email or web address] for contact info under        Signed, Sanda Klein, MD  10/16/2021, 11:16 AM

## 2021-10-16 NOTE — Assessment & Plan Note (Addendum)
Due to renal failure. ?

## 2021-10-16 NOTE — Progress Notes (Signed)
ANTICOAGULATION CONSULT NOTE - follow up  Pharmacy Consult for Heparin (apixaban PTA) Indication: chest pain/ACS  Allergies  Allergen Reactions   Sulfa Antibiotics Nausea Only    Patient Measurements: Height: 5\' 3"  (160 cm) Weight: 53.6 kg (118 lb 2.7 oz) IBW/kg (Calculated) : 52.4 Heparin Dosing Weight: TBW  Vital Signs: Temp: 97.5 F (36.4 C) (10/27 0400) Temp Source: Oral (10/27 0400) BP: 131/68 (10/27 0600) Pulse Rate: 88 (10/27 0600)  Labs: Recent Labs    10/14/21 0456 10/14/21 1505 10/14/21 2351 10/15/21 0255 10/15/21 0955 10/15/21 2113 10/16/21 0252  HGB 10.1*  --   --  9.7*  --   --  10.3*  HCT 32.1*  --   --  31.2*  --   --  32.8*  PLT 176  --   --  182  --   --  193  APTT 65* 64* 113*  --   --   --   --   HEPARINUNFRC 0.69  --  0.85*  --  0.74* 0.61 0.59  CREATININE 2.12*  --   --  2.11*  --   --  2.01*     Estimated Creatinine Clearance: 16.3 mL/min (A) (by C-G formula based on SCr of 2.01 mg/dL (H)).  Medications:  Scheduled:   aspirin EC  81 mg Oral Daily   atorvastatin  20 mg Oral Daily   Chlorhexidine Gluconate Cloth  6 each Topical Daily   famotidine  20 mg Oral Daily   furosemide  80 mg Oral Daily   hydrALAZINE  10 mg Oral Q8H   hydrocortisone sod succinate (SOLU-CORTEF) inj  50 mg Intravenous Daily   mouth rinse  15 mL Mouth Rinse BID   multivitamin with minerals  1 tablet Oral Daily   sodium chloride flush  3 mL Intravenous Q12H   Infusions:   sodium chloride Stopped (10/10/21 1902)   heparin 850 Units/hr (10/15/21 1736)    Assessment: 17 yoF presented to ED with ShOB.  Heparin per pharmacy for ACS, hx PE/DVT on apixaban.  PTA Eliquis held, last dose 10/20 at 18:00 Troponin: 523, 836 >> 5378 CBC:  Hgb 11.5, Plt WNL Baseline aPTT 29 seconds, INR 1.7  Heparin turned off for RHC 10/24, pharmacy asked to resume now post-procedure.  10/16/2021: Heparin level 0.59, therapeutic on Heparin 850 unit/hr. CBC- Hg low but stable at 10.3,  pltc WNL No bleeding or infusion related concerns per RN.    Goal of Therapy:  Heparin level 0.3-0.7 units/ml aPTT 66-102 seconds Monitor platelets by anticoagulation protocol: Yes   Plan:  Continue heparin @ 850 units/hr. Daily heparin level and CBC while on heparin F/U plans to resume apixaban based on any further procedures    Gretta Arab PharmD, BCPS Clinical Pharmacist [facility] main pharmacy [phone] 10/16/2021 7:31 AM

## 2021-10-16 NOTE — Assessment & Plan Note (Signed)
Doubt ACS, likely demand ischemia in the setting of severe mitral regurgitation -Continue aspirin, atorvastatin

## 2021-10-16 NOTE — Assessment & Plan Note (Signed)
Baseline Cr 1.8-2.1.  Here was up to 2.7, improved to 2.1 today.

## 2021-10-16 NOTE — Assessment & Plan Note (Signed)
As evidenced by severe subcutaneous loss of muscle mass and fat

## 2021-10-16 NOTE — Assessment & Plan Note (Signed)
First diagnosed back in Feb due to slowly progressive DOE.  Saw Dr. Silas Flood, performed in office thoracentesis with improvement in symptoms, transudate.  During May shoulder surgery, had post-op hypoxia, thoracentesis repeated (#2), hypoxia didn't resolve, NM scan showed PE and LE US showed DVTs.  Started on anticoagulation.  At July Cards visit, DOE worse, lasix increased, no CXR.  At Sep Pulm visit, DOE worse still, CXR showed worsening effusion, thoracentesis #3 repeated 9/21.  Admitted now with accelerating DOE.  Effusion large, drained on 9/21 (thoracentesis #4) and 9/23 (thora #5)  D/w Onc, MM not a cause of effusion.  Agree with Cardiology, effusion is cardiogenic.  Had been making good progress improving dyspnea with diuretics and afterload reduction, until Lasix reduced yesterday and SOB worse today.    - Continue Lasix, back to IV today - Discussed with Pulm, no thoracentesis today, ? If thora would help prior to TEE, to lay flat

## 2021-10-16 NOTE — Assessment & Plan Note (Signed)
Uses O2 "as needed" at home due to chron res failure due to CHF.   Here due to severe mitral regurgitation and pleural effusion due to mitral regurgitation

## 2021-10-16 NOTE — Progress Notes (Signed)
Progress Note    Jillian Cooley   TKW:[id number]  DOB: [birth year]  DOA: 10/10/2021     6 Date of Service: 10/16/2021     Brief summary: Jillian Cooley is an 87 y.o. F with dCHF on home O2 PRN, recent Dx MM not yet started chemo, DVT and PE after rotator cuff surgery last May, on apixaban, as well as recurrent transudative pleural effusion (see below) who presetned with slowly progressive SOB, finally severe.  In the ER, acute respiratory failure with hypoxia requiring BiPAP in ED.  CXR showed large left effusion, admitted to ICU by CCM.     10/21: Admitted to ICU, underwent thora 1.5L; echo shows severe MR 10/23: Repeat thora, 1.2L 10/24: RHC showed normal CO, high filling pressures, prominent v-wave         Assessment and Plan * Acute on chronic respiratory failure with hypoxia (HCC) Uses O2 "as needed" at home due to chron res failure due to CHF.   Here due to severe mitral regurgitation and pleural effusion due to mitral regurgitation  Pleural effusion, left First diagnosed back in Feb due to slowly progressive DOE.  Saw Dr. Silas Flood, performed in office thoracentesis with improvement in symptoms, transudate.  During May shoulder surgery, had post-op hypoxia, thoracentesis repeated (#2), hypoxia didn't resolve, NM scan showed PE and LE US showed DVTs.  Started on anticoagulation.  At July Cards visit, DOE worse, lasix increased, no CXR.  At Sep Pulm visit, DOE worse still, CXR showed worsening effusion, thoracentesis #3 repeated 9/21.  Admitted now with accelerating DOE.  Effusion large, drained on 9/21 (thoracentesis #4) and 9/23 (thora #5)  D/w Onc, MM not a cause of effusion.  Agree with Cardiology, effusion is cardiogenic.  Had been making good progress improving dyspnea with diuretics and afterload reduction, until Lasix reduced yesterday and SOB worse today.    - Continue Lasix, back to IV today - Discussed with Pulm, no thoracentesis today, ? If  thora would help prior to TEE, to lay flat   PAH (pulmonary artery hypertension) ([place]) - See above  Severe mitral regurgitation May Echo actually was read as mild to moderate MR.  Now seems worse.  - Continue Lasix - Continue new hydralazine  Acute on chronic diastolic CHF (congestive heart failure) ([place]) Net even yesterday, net neg 500 cc on admission. Cr stable, K low. -Continue Furosemide, back to IV today - Consult Cardiology, appreciate cares - Transfer to [facility] for TEE and structural heart possibly -Continue K  -Strict I/Os, daily weights, telemetry  -Daily monitoring renal function    Non-ST elevation (NSTEMI) myocardial infarction [facility]) Doubt ACS, likely demand ischemia in the setting of severe mitral regurgitation -Continue aspirin, atorvastatin  Acute renal failure superimposed on stage 4 chronic kidney disease (HCC) Baseline Cr 1.8-2.1.  Here was up to 2.7, improved to 2.1 today.     Bilateral pulmonary embolism (HCC) Diagnosed in May - Continue heparin until decisions re: repeat Thora and repair of MV are made  Pressure injury of coccygeal region, stage 2 ([place])     Multiple myeloma not having achieved remission ([place]) Diagnosed this summer in the context of worsening anemia.  Discussed with [name].  Treatment should wait until after her effusion is stabilized.  Normocytic anemia Hgb no change  Essential hypertension BP stable - Continue furosemide, hydralazine  Mobitz type 1 second degree AV block Asymptomatic  Hypokalemia Resolved  Hyperphosphatemia Due to renal failure    Protein-calorie malnutrition, severe As evidenced by  severe subcutaneous loss of muscle mass and fat  Hypotension, unspecified Blood pressure stabilized     Subjective:  No fever, no sputum,.  Her dyspnea is worse than yesterday.  Her orthopnea is worse than yesterday.  She is out of breath just walking back from the bathroom.  No confusion. Discussed with daughter  at the bedside.  Objective Vitals:   10/16/21 1300 10/16/21 1400 10/16/21 1500 10/16/21 1545  BP:  (!) 122/55    Pulse: 96 92 92   Resp: (!) $RemoveB'21 19 16   'iChMSxjw$ Temp:    (!) 97.4 F (36.3 C)  TempSrc:    Oral  SpO2: 95% 99% 95%   Weight:      Height:       53.6 kg  Vital signs were reviewed and unremarkable.   Exam General appearance: Elderly adult female, sitting up in recliner, appears out of breath HEENT: Anicteric, conjunctival pink, lids and lashes normal.  No nasal deformity, discharge, or epistaxis. Skin: No suspicious rashes or lesions, skin warm and dry Cardiac: Tachycardic, soft systolic murmur, no lower extremity edema. Respiratory: Increased respiratory rate, egophony bilateral bases, diminished at bilateral bases, I think I hear rales on the left more than the right.  No wheezing. Abdomen: Abdomen soft without tenderness palpation or guarding MSK: Reduced subcutaneous muscle mass and fat Neuro: Awake, responds to questions, moves upper extremities with generalized weakness but symmetric strength, speech fluent, face symmetric Psych: Attention normal, affect appropriate, judgment insight appear normal    Labs / Other Information My review of labs, imaging, notes and other tests is significant for Hemoglobin stable at 10, creatinine down to 2, glucose good, BNP still 1000.     Disposition Plan: Status is: Inpatient  Remains inpatient appropriate because: Ongoing IV Lasix.  We will transferred to [facility] now, so her diuresis can be continued and possibly obtain a TEE when she can lie flat.  Based on the TEE, will know whether she is a candidate for MitraClip or not.  If she is not, her prognosis is considerably worse because is not clear that she is a good surgical candidate for open mitral valve repair.        Time spent: 35 minutes [facility] 10/16/2021, 3:46 PM

## 2021-10-16 NOTE — Assessment & Plan Note (Signed)
Diagnosed in May - Continue heparin until decisions re: repeat Thora and repair of MV are made

## 2021-10-16 NOTE — Assessment & Plan Note (Signed)
Blood pressure stabilized

## 2021-10-16 NOTE — Telephone Encounter (Signed)
Pt has been scheduled an appt with Dr. Silas Flood. Nothing further needed.

## 2021-10-16 NOTE — Progress Notes (Signed)
    [facility] has been requested to perform a transesophageal echocardiogram on Jillian Cooley for mitral insufficiency.  After careful review of history and examination, the risks and benefits of transesophageal echocardiogram have been explained including risks of esophageal damage, perforation (1:10,000 risk), bleeding, pharyngeal hematoma as well as other potential complications associated with conscious sedation including aspiration, arrhythmia, respiratory failure and death. Alternatives to treatment were discussed, questions were answered. Patient is willing to proceed.   Pt was consented with Dr. Sallyanne Kuster.   Tami Lin Braelyn Bordonaro, [place]  10/16/2021 3:09 PM

## 2021-10-17 ENCOUNTER — Inpatient Hospital Stay: Payer: Medicare PPO

## 2021-10-17 ENCOUNTER — Encounter (HOSPITAL_COMMUNITY)
Admission: EM | Disposition: A | Discharge status: Home Health | Payer: Self-pay | Source: Home / Self Care | Attending: Internal Medicine

## 2021-10-17 ENCOUNTER — Ambulatory Visit: Payer: Medicare PPO

## 2021-10-17 ENCOUNTER — Other Ambulatory Visit: Payer: Medicare PPO

## 2021-10-17 ENCOUNTER — Inpatient Hospital Stay (HOSPITAL_COMMUNITY): Payer: Medicare PPO

## 2021-10-17 ENCOUNTER — Inpatient Hospital Stay: Payer: Medicare PPO | Admitting: Physician Assistant

## 2021-10-17 ENCOUNTER — Inpatient Hospital Stay (HOSPITAL_COMMUNITY): Payer: Medicare PPO | Admitting: Anesthesiology

## 2021-10-17 DIAGNOSIS — J9601 Acute respiratory failure with hypoxia: Secondary | ICD-10-CM | POA: Diagnosis not present

## 2021-10-17 DIAGNOSIS — I340 Nonrheumatic mitral (valve) insufficiency: Secondary | ICD-10-CM

## 2021-10-17 DIAGNOSIS — I5033 Acute on chronic diastolic (congestive) heart failure: Secondary | ICD-10-CM | POA: Diagnosis not present

## 2021-10-17 DIAGNOSIS — N179 Acute kidney failure, unspecified: Secondary | ICD-10-CM | POA: Diagnosis not present

## 2021-10-17 DIAGNOSIS — I2721 Secondary pulmonary arterial hypertension: Secondary | ICD-10-CM | POA: Diagnosis not present

## 2021-10-17 HISTORY — PX: TEE WITHOUT CARDIOVERSION: SHX5443

## 2021-10-17 HISTORY — PX: IR THORACENTESIS ASP PLEURAL SPACE W/IMG GUIDE: IMG5380

## 2021-10-17 LAB — QUANTIFERON-TB GOLD PLUS (RQFGPL)
QuantiFERON Mitogen Value: 1.51 IU/mL
QuantiFERON Nil Value: 0.09 IU/mL
QuantiFERON TB1 Ag Value: 0.08 IU/mL
QuantiFERON TB2 Ag Value: 0.04 IU/mL

## 2021-10-17 LAB — MISC LABCORP TEST (SEND OUT): Labcorp test code: 9985

## 2021-10-17 LAB — BASIC METABOLIC PANEL
Anion gap: 7 (ref 5–15)
BUN: 65 mg/dL — ABNORMAL HIGH (ref 8–23)
CO2: 26 mmol/L (ref 22–32)
Calcium: 8.8 mg/dL — ABNORMAL LOW (ref 8.9–10.3)
Chloride: 104 mmol/L (ref 98–111)
Creatinine, Ser: 1.9 mg/dL — ABNORMAL HIGH (ref 0.44–1.00)
GFR, Estimated: 25 mL/min — ABNORMAL LOW (ref >60)
Glucose, Bld: 101 mg/dL — ABNORMAL HIGH (ref 70–99)
Potassium: 4.1 mmol/L (ref 3.5–5.1)
Sodium: 137 mmol/L (ref 135–145)

## 2021-10-17 LAB — CBC
HCT: 33.8 % — ABNORMAL LOW (ref 36.0–46.0)
Hemoglobin: 10.9 g/dL — ABNORMAL LOW (ref 12.0–15.0)
MCH: 29.5 pg (ref 26.0–34.0)
MCHC: 32.2 g/dL (ref 30.0–36.0)
MCV: 91.6 fL (ref 80.0–100.0)
Platelets: 228 10*3/uL (ref 150–400)
RBC: 3.69 MIL/uL — ABNORMAL LOW (ref 3.87–5.11)
RDW: 15 % (ref 11.5–15.5)
WBC: 9.7 10*3/uL (ref 4.0–10.5)
nRBC: 0 % (ref 0.0–0.2)

## 2021-10-17 LAB — QUANTIFERON-TB GOLD PLUS: QuantiFERON-TB Gold Plus: NEGATIVE

## 2021-10-17 LAB — HEPARIN LEVEL (UNFRACTIONATED): Heparin Unfractionated: 0.56 IU/mL (ref 0.30–0.70)

## 2021-10-17 SURGERY — ECHOCARDIOGRAM, TRANSESOPHAGEAL
Anesthesia: Monitor Anesthesia Care

## 2021-10-17 MED ORDER — PROPOFOL 500 MG/50ML IV EMUL
INTRAVENOUS | Status: DC | PRN
  Administered 2021-10-17: 100 ug/kg/min via INTRAVENOUS

## 2021-10-17 MED ORDER — SODIUM CHLORIDE 0.9 % IV SOLN
INTRAVENOUS | Status: AC | PRN
  Administered 2021-10-17: 500 mL via INTRAVENOUS

## 2021-10-17 MED ORDER — PROPOFOL 10 MG/ML IV BOLUS
INTRAVENOUS | Status: DC | PRN
  Administered 2021-10-17: 20 mg via INTRAVENOUS

## 2021-10-17 MED ORDER — GLYCOPYRROLATE PF 0.2 MG/ML IJ SOSY
PREFILLED_SYRINGE | INTRAMUSCULAR | Status: DC | PRN
  Administered 2021-10-17: .2 mg via INTRAVENOUS

## 2021-10-17 MED ORDER — PHENYLEPHRINE 40 MCG/ML (10ML) SYRINGE FOR IV PUSH (FOR BLOOD PRESSURE SUPPORT)
PREFILLED_SYRINGE | INTRAVENOUS | Status: DC | PRN
  Administered 2021-10-17 (×3): 80 ug via INTRAVENOUS
  Administered 2021-10-17: 40 ug via INTRAVENOUS
  Administered 2021-10-17: 120 ug via INTRAVENOUS

## 2021-10-17 MED ORDER — LIDOCAINE HCL 1 % IJ SOLN
INTRAMUSCULAR | Status: AC
  Filled 2021-10-17: qty 20

## 2021-10-17 MED ORDER — PHENYLEPHRINE HCL-NACL 20-0.9 MG/250ML-% IV SOLN
INTRAVENOUS | Status: DC | PRN
Start: 2021-10-17 — End: 2021-10-17
  Administered 2021-10-17: 25 ug/min via INTRAVENOUS

## 2021-10-17 NOTE — Transfer of Care (Signed)
Immediate Anesthesia Transfer of Care Note  Patient: Jillian Cooley  Procedure(s) Performed: TRANSESOPHAGEAL ECHOCARDIOGRAM (TEE)  Patient Location: Endoscopy Unit  Anesthesia Type:MAC  Level of Consciousness: drowsy and patient cooperative  Airway & Oxygen Therapy: Patient Spontanous Breathing and Patient connected to nasal cannula oxygen  Post-op Assessment: Report given to RN, Post -op Vital signs reviewed and stable and Patient moving all extremities  Post vital signs: Reviewed and stable  Last Vitals:  Vitals Value Taken Time  BP 93/48 10/17/21 1450  Temp    Pulse 98 10/17/21 1451  Resp 17 10/17/21 1451  SpO2 100 % 10/17/21 1451  Vitals shown include unvalidated device data.  Last Pain:  Vitals:   10/17/21 1300  TempSrc: Oral  PainSc:       Patients Stated Pain Goal: 0 (46/80/32 1224)  Complications: No notable events documented.

## 2021-10-17 NOTE — CV Procedure (Signed)
INDICATIONS: Mixed mitral valve disease  PROCEDURE:   Informed consent was obtained prior to the procedure. The risks, benefits and alternatives for the procedure were discussed and the patient comprehended these risks.  Risks include, but are not limited to, cough, sore throat, vomiting, nausea, somnolence, esophageal and stomach trauma or perforation, bleeding, low blood pressure, aspiration, pneumonia, infection, trauma to the teeth and death.    After a procedural time-out, the oropharynx was anesthetized with 20% benzocaine spray.   During this procedure the patient was administered propofol per anesthesia.  [name] heart rate, blood pressure, and oxygen saturation were monitored continuously during the procedure. The period of conscious sedation was 40 minutes, of which I was present face-to-face 100% of this time.  The transesophageal probe was inserted in the esophagus and stomach without difficulty and multiple views were obtained.  The patient was kept under observation until the patient left the procedure room.  The patient left the procedure room in stable condition.   Agitated microbubble saline contrast was not administered.  COMPLICATIONS:    There were no immediate complications.  FINDINGS:   FORMAL ECHOCARDIOGRAM REPORT PENDING Severe thickening of the mitral valve with at least mild mitral stenosis (mean gradient 5 mmHg at HR 60) and severe mitral valve regurgitation. I suspect the mechanism of MR is bileaflet restriction with poor commissural coaptation.   Mild-mod TR, Mild PR, Mild AR Preserved LV function.  RECOMMENDATIONS:    Discuss with structural heart team. Based on these findings, does not appear to be a suitable MitraClip candidate given diastolic mitral gradient and bileaflet thickening.  Time Spent Directly with the Patient:  60 minutes   Jillian Cooley 10/17/2021, 2:56 PM

## 2021-10-17 NOTE — Progress Notes (Signed)
ANTICOAGULATION CONSULT NOTE - follow up  Pharmacy Consult for Heparin (apixaban PTA) Indication: chest pain/ACS  Allergies  Allergen Reactions   Sulfa Antibiotics Nausea Only    Patient Measurements: Height: 5\' 3"  (160 cm) Weight: 55.4 kg (122 lb 2.2 oz) IBW/kg (Calculated) : 52.4 Heparin Dosing Weight: TBW  Vital Signs: Temp: 97.8 F (36.6 C) (10/28 0742) Temp Source: Oral (10/28 0742) BP: 111/67 (10/28 0742) Pulse Rate: 93 (10/28 0742)  Labs: Recent Labs     0000 10/14/21 1505 10/14/21 2351 10/15/21 0255 10/15/21 0955 10/15/21 2113 10/16/21 0252 10/17/21 0329  HGB   < >  --   --  9.7*  --   --  10.3* 10.9*  HCT  --   --   --  31.2*  --   --  32.8* 33.8*  PLT  --   --   --  182  --   --  193 228  APTT  --  64* 113*  --   --   --   --   --   HEPARINUNFRC  --   --  0.85*  --    < > 0.61 0.59 0.56  CREATININE  --   --   --  2.11*  --   --  2.01* 1.90*   < > = values in this interval not displayed.     Estimated Creatinine Clearance: 17.3 mL/min (A) (by C-G formula based on SCr of 1.9 mg/dL (H)).  Medications:  Scheduled:   aspirin EC  81 mg Oral Daily   atorvastatin  20 mg Oral Daily   Chlorhexidine Gluconate Cloth  6 each Topical Daily   famotidine  20 mg Oral Daily   furosemide  80 mg Intravenous Q12H   hydrALAZINE  25 mg Oral Q8H   hydrocortisone sod succinate (SOLU-CORTEF) inj  50 mg Intravenous Daily   mouth rinse  15 mL Mouth Rinse BID   multivitamin with minerals  1 tablet Oral Daily   potassium chloride  20 mEq Oral TID   sodium chloride flush  3 mL Intravenous Q12H   Infusions:   sodium chloride Stopped (10/10/21 1902)   sodium chloride     heparin 850 Units/hr (10/16/21 2249)    Assessment: 29 yoF presented to ED with ShOB.  Heparin per pharmacy for ACS, hx PE/DVT on apixaban.  PTA Eliquis held, last dose 10/20 at 18:00 Troponin: 523, 836 >> 5378 CBC:  Hgb 11.5, Plt WNL Baseline aPTT 29 seconds, INR 1.7  10/17/2021: Heparin level 0.56,  therapeutic on Heparin 850 unit/hr. CBC- Hgb stable at 10.9, pltc WNL No bleeding or infusion related concerns noted.    Goal of Therapy:  Heparin level 0.3-0.7 units/ml Monitor platelets by anticoagulation protocol: Yes   Plan:  Continue heparin @ 850 units/hr. Daily heparin level and CBC while on heparin F/U plans to resume apixaban based on any further procedures  Erin Hearing PharmD., BCPS Clinical Pharmacist 10/17/2021 9:48 AM

## 2021-10-17 NOTE — Progress Notes (Signed)
[facility] PROGRESS NOTE   Jillian Cooley WFU:[id number] DOB: [birth year] DOA: 10/10/2021  PCP: [name], MD  Brief History/Interval Summary: 87 y.o. F with dCHF on home O2 PRN, recent Dx MM not yet started chemo, DVT and PE after rotator cuff surgery last May, on apixaban, as well as recurrent transudative pleural effusion (see below) who presented with slowly progressive SOB, finally severe.  Required BiPAP initially.  Was found to have a large left-sided pleural effusion.  Was admitted to the intensive care unit.  Underwent thoracentesis x2 during this hospital stay.  Seen by cardiology as well.  Underwent right heart catheterization.  Plan is for TEE.   Reason for Visit: Pleural effusion  Consultants: Pulmonology.  Cardiology  Procedures: Thoracentesis x2.  Right heart catheterization.    Subjective/Interval History: Patient mentions that shortness of breath is slightly better compared to yesterday.  Denies any chest pain.  Able to lie almost flat in the bed today.  Nuys any nausea vomiting.     Assessment/Plan:  Acute on chronic respiratory failure with hypoxia Secondary to CHF along with pleural effusion.  Uses oxygen as needed at home.  Currently on 2 to 3 L by nasal cannula saturating in the mid to late 90s.  Left-sided pleural effusion Initially diagnosed in February.  Seen by pulmonology.  Has undergone multiple thoracentesis including twice during this hospital stay.  Analysis is consistent with a transudative process.  Thought to be secondary to cardiac process.   Chest x-ray done yesterday continues to show left-sided pleural effusion.  However she does not appear to be particularly symptomatic from it.  Saturations are quite normal on 2 to 3 L of oxygen.  She is able to lay flat on the bed. However she might benefit from repeat thoracentesis.  This has been ordered to be done by interventional radiology. Pulmonology to follow patient in their  office.  Acute on chronic diastolic CHF/NSTEMI/Mobitz type I AV block Has been on and off of diuretics during this hospital stay mainly driven by creatinine and symptoms.  Started on high-dose diuretics with Lasix yesterday.  Diuresed well.  Creatinine has improved.  Continue to monitor ins and outs and daily weights. Patient was found to have elevated troponin levels though doubt acute coronary syndrome.  There was concern for severe mitral regurgitation on echocardiogram.  Plan is for TEE today.  Cardiology continues to follow.  Patient is on aspirin and statin.  Not noted to be on beta-blocker at this time.  Not on ACE inhibitor or ARB due to elevated creatinine.  Severe mitral regurgitation See above.  Plan is for TEE today.  Acute kidney injury superimposed on chronic kidney disease stage IV Baseline creatinine is around 1.8-2.1.  It was up to 2.7.  Has been improving.  Down to 1.9 today.  Monitor urine output.  History of bilateral pulmonary embolism diagnosed in May Was on apixaban.  Currently on IV heparin until all procedures have been completed.  Respiratory status is stable.  Pulmonary artery hypertension Cardiology and pulmonology has been following.  Essential hypertension/hypokalemia Continue hydralazine.  Monitor blood pressures closely. Potassium stable.  History of multiple myeloma Followed by [name] with oncology.  Has not been started on treatment yet due to her other comorbidities and acute issues.  Outpatient follow-up.  Normocytic anemia Stable.  No evidence of overt bleeding.  Severe protein calorie malnutrition Nutrition Problem: Severe Malnutrition Etiology: chronic illness  Signs/Symptoms: severe fat depletion, severe muscle depletion  Interventions: Magic  cup, MVI   DVT Prophylaxis: On IV heparin Code Status: Full code Family Communication: No family at bedside.  Discussed with patient Disposition Plan: To be determined.  PT and OT  evaluation.  Status is: Inpatient  Remains inpatient appropriate because: Need for procedures that cannot be done in the outpatient setting and IV diuretics     Medications: Scheduled:  aspirin EC  81 mg Oral Daily   atorvastatin  20 mg Oral Daily   Chlorhexidine Gluconate Cloth  6 each Topical Daily   furosemide  80 mg Intravenous Q12H   hydrALAZINE  25 mg Oral Q8H   hydrocortisone sod succinate (SOLU-CORTEF) inj  50 mg Intravenous Daily   mouth rinse  15 mL Mouth Rinse BID   multivitamin with minerals  1 tablet Oral Daily   potassium chloride  20 mEq Oral TID   sodium chloride flush  3 mL Intravenous Q12H   Continuous:  sodium chloride Stopped (10/10/21 1902)   sodium chloride     heparin 850 Units/hr (10/16/21 2249)   TXH:FSFSEL chloride, acetaminophen, docusate sodium, ondansetron (ZOFRAN) IV, polyethylene glycol  Antibiotics: Anti-infectives (From admission, onward)    Start     Dose/Rate Route Frequency Ordered Stop   10/10/21 1900  cefTRIAXone (ROCEPHIN) 2 g in sodium chloride 0.9 % 100 mL IVPB  Status:  Discontinued        2 g 200 mL/hr over 30 Minutes Intravenous Every 24 hours 10/10/21 1457 10/11/21 1039   10/10/21 1900  azithromycin (ZITHROMAX) 500 mg in sodium chloride 0.9 % 250 mL IVPB  Status:  Discontinued        500 mg 250 mL/hr over 60 Minutes Intravenous Every 24 hours 10/10/21 1457 10/11/21 1015   10/10/21 1045  piperacillin-tazobactam (ZOSYN) IVPB 3.375 g        3.375 g 100 mL/hr over 30 Minutes Intravenous  Once 10/10/21 1034 10/10/21 1255   10/10/21 1030  piperacillin-tazobactam (ZOSYN) IVPB 2.25 g  Status:  Discontinued        2.25 g 100 mL/hr over 30 Minutes Intravenous  Once 10/10/21 1017 10/10/21 1034       Objective:  Vital Signs  Vitals:   10/16/21 2349 10/17/21 0415 10/17/21 0549 10/17/21 0742  BP: 107/65 119/69 117/68 111/67  Pulse: 82 91 88 93  Resp: _0 Temp: 97.6 F (36.4 C) 97.6 F (36.4 C)  97.8 F (36.6 C)   TempSrc: Oral Oral  Oral  SpO2: 100% 99% 98% 97%  Weight:  55.4 kg    Height:        Intake/Output Summary (Last 24 hours) at 10/17/2021 1010 Last data filed at 10/17/2021 0426 Gross per 24 hour  Intake --  Output 1125 ml  Net -1125 ml   Filed Weights   10/16/21 0400 10/16/21 1720 10/17/21 0415  Weight: 53.6 kg 55.9 kg 55.4 kg    General appearance: Awake alert.  In no distress Resp: Normal effort at rest.  Diminished air entry at the bases left more than right.  Few crackles.  No wheezing or rhonchi Cardio: S1-S2 is normal regular.  No S3-S4.  Systolic murmur appreciated over the precordium GI: Abdomen is soft.  Nontender nondistended.  Bowel sounds are present normal.  No masses organomegaly Extremities: No edema.  Full range of motion of lower extremities. Neurologic: Alert and oriented x3.  No focal neurological deficits.    Lab Results:  Data Reviewed: I have personally reviewed following labs and imaging studies  CBC: Recent Labs  Lab 10/10/21 1548 10/11/21 0333 10/13/21 0258 10/13/21 1437 10/14/21 0456 10/15/21 0255 10/16/21 0252 10/17/21 0329  WBC 10.9*   < > 7.5  --  5.8 7.3 7.6 9.7  NEUTROABS 9.5*  --   --   --   --   --   --   --   HGB 10.6*   < > 9.9* 10.9*  10.2* 10.1* 9.7* 10.3* 10.9*  HCT 34.2*   < > 31.6* 32.0*  30.0* 32.1* 31.2* 32.8* 33.8*  MCV 94.5   < > 92.9  --  94.1 94.8 94.5 91.6  PLT 218   < > 191  --  176 182 193 228   < > = values in this interval not displayed.    Basic Metabolic Panel: Recent Labs  Lab 10/10/21 1548 10/11/21 0333 10/12/21 0252 10/13/21 0258 10/13/21 1437 10/14/21 0456 10/15/21 0255 10/16/21 0252 10/17/21 0329  NA 138 138 138 138 141  143 141 135 138 137  K 4.2 4.1 3.8 3.3* 3.5  3.3* 4.1 3.0* 4.5 4.1  CL 107 104 104 104  --  108 103 106 104  CO2 18* _0 --  _1 GLUCOSE 105* 152* 133* 115*  --  126* 111* 118* 101*  BUN 73* 79* 83* 88*  --  86* 76* 76* 65*  CREATININE 1.93* 2.53* 2.80*  2.48*  --  2.12* 2.11* 2.01* 1.90*  CALCIUM 9.0 8.9 8.9 8.7*  --  8.8* 8.1* 8.6* 8.8*  MG 2.3 2.2 2.3  --   --  2.3  --   --   --   PHOS 5.8* 6.1* 5.9*  --   --   --   --   --   --     GFR: Estimated Creatinine Clearance: 17.3 mL/min (A) (by C-G formula based on SCr of 1.9 mg/dL (H)).  Liver Function Tests: Recent Labs  Lab 10/10/21 1548 10/12/21 0252  AST 32 16  ALT 30 25  ALKPHOS 45 40  BILITOT 0.8 0.7  PROT 6.0* 5.8*  ALBUMIN 3.4* 3.2*    Recent Labs  Lab 10/10/21 1548  LIPASE 34  AMYLASE 29     BNP (last 3 results) Recent Labs    02/28/21 1109 07/07/21 1500  PROBNP 1,545* 3,761*      Recent Results (from the past 240 hour(s))  Resp Panel by RT-PCR (Flu A&B, Covid) Nasopharyngeal Swab     Status: None   Collection Time: 10/10/21  8:57 AM   Specimen: Nasopharyngeal Swab; Nasopharyngeal(NP) swabs in vial transport medium  Result Value Ref Range Status   SARS Coronavirus 2 by RT PCR NEGATIVE NEGATIVE Final    Comment: (NOTE) SARS-CoV-2 target nucleic acids are NOT DETECTED.  The SARS-CoV-2 RNA is generally detectable in upper respiratory specimens during the acute phase of infection. The lowest concentration of SARS-CoV-2 viral copies this assay can detect is 138 copies/mL. A negative result does not preclude SARS-Cov-2 infection and should not be used as the sole basis for treatment or other patient management decisions. A negative result may occur with  improper specimen collection/handling, submission of specimen other than nasopharyngeal swab, presence of viral mutation(s) within the areas targeted by this assay, and inadequate number of viral copies(<138 copies/mL). A negative result must be combined with clinical observations, patient history, and epidemiological information. The expected result is Negative.  Fact Sheet for Patients:  EntrepreneurPulse.com.au  Fact Sheet for Healthcare Providers:  IncredibleEmployment.be  This test is no t yet approved or cleared by the [place] and  has been authorized for detection and/or diagnosis of SARS-CoV-2 by FDA under an Emergency Use Authorization (EUA). This EUA will remain  in effect (meaning this test can be used) for the duration of the COVID-19 declaration under Section 564(b)(1) of the Act, 21 U.S.C.section 360bbb-3(b)(1), unless the authorization is terminated  or revoked sooner.       Influenza A by PCR NEGATIVE NEGATIVE Final   Influenza B by PCR NEGATIVE NEGATIVE Final    Comment: (NOTE) The Xpert Xpress SARS-CoV-2/FLU/RSV plus assay is intended as an aid in the diagnosis of influenza from Nasopharyngeal swab specimens and should not be used as a sole basis for treatment. Nasal washings and aspirates are unacceptable for Xpert Xpress SARS-CoV-2/FLU/RSV testing.  Fact Sheet for Patients: EntrepreneurPulse.com.au  Fact Sheet for Healthcare Providers: IncredibleEmployment.be  This test is not yet approved or cleared by the [place] FDA and has been authorized for detection and/or diagnosis of SARS-CoV-2 by FDA under an Emergency Use Authorization (EUA). This EUA will remain in effect (meaning this test can be used) for the duration of the COVID-19 declaration under Section 564(b)(1) of the Act, 21 U.S.C. section 360bbb-3(b)(1), unless the authorization is terminated or revoked.  Performed at [facility], [place] [address], [place], [place] [address]   Culture, blood (routine x 2)     Status: None   Collection Time: 10/10/21 10:08 AM   Specimen: BLOOD  Result Value Ref Range Status   Specimen Description   Final    BLOOD RIGHT ANTECUBITAL Performed at [facility] [address] [place], [place] [address]    Special Requests   Final    BOTTLES DRAWN AEROBIC AND ANAEROBIC Blood Culture results may not be  optimal due to an inadequate volume of blood received in culture bottles Performed at [place] [facility] [address] [place], [place] [address]    Culture   Final    NO GROWTH 5 DAYS Performed at [facility], [place] [address], [place], [place] [address]    Report Status 10/15/2021 FINAL  Final  Body fluid culture w Gram Stain     Status: None   Collection Time: 10/10/21  2:21 PM   Specimen: PATH Cytology Pleural fluid  Result Value Ref Range Status   Specimen Description   Final    PLEURAL Performed at [facility] [address], [place], [place] [address]    Special Requests   Final    NONE Performed at [facility], [place] [address], [place], [place] [address]    Gram Stain   Final    NO SQUAMOUS EPITHELIAL CELLS SEEN FEW WBC SEEN NO ORGANISMS SEEN    Culture   Final    NO GROWTH 3 DAYS Performed at [facility], [place] [address], [place], [place] [address]    Report Status 10/14/2021 FINAL  Final  MRSA Next Gen by PCR, Nasal     Status: None   Collection Time: 10/10/21  5:34 PM   Specimen: Nasal Mucosa; Nasal Swab  Result Value Ref Range Status   MRSA by PCR Next Gen NOT DETECTED NOT DETECTED Final    Comment: (NOTE) The GeneXpert MRSA Assay (FDA approved for NASAL specimens only), is one component of a comprehensive MRSA colonization surveillance program. It is not intended to diagnose MRSA infection nor to guide  or monitor treatment for MRSA infections. Test performance is not FDA approved in patients less than 65 years old. Performed at [facility], [place] [address] [place], [place] [address]   Culture, blood (routine x 2)     Status: None   Collection Time: 10/10/21  6:22 PM   Specimen: BLOOD LEFT FOREARM  Result Value Ref Range Status   Specimen Description   Final    BLOOD LEFT FOREARM Performed at [facility] [address], [place], [place] [address]     Special Requests   Final    BOTTLES DRAWN AEROBIC ONLY Blood Culture adequate volume Performed at [facility] [address], [place], [place] [address]    Culture   Final    NO GROWTH 5 DAYS Performed at [facility], [place] [address] [place], [place] [address]    Report Status 10/15/2021 FINAL  Final  Respiratory (~20 pathogens) panel by PCR     Status: None   Collection Time: 10/11/21 10:29 AM   Specimen: Nasopharyngeal Swab; Respiratory  Result Value Ref Range Status   Adenovirus NOT DETECTED NOT DETECTED Final   Coronavirus 229E NOT DETECTED NOT DETECTED Final    Comment: (NOTE) The Coronavirus on the Respiratory Panel, DOES NOT test for the novel  Coronavirus (2019 nCoV)    Coronavirus HKU1 NOT DETECTED NOT DETECTED Final   Coronavirus NL63 NOT DETECTED NOT DETECTED Final   Coronavirus OC43 NOT DETECTED NOT DETECTED Final   Metapneumovirus NOT DETECTED NOT DETECTED Final   Rhinovirus / Enterovirus NOT DETECTED NOT DETECTED Final   Influenza A NOT DETECTED NOT DETECTED Final   Influenza B NOT DETECTED NOT DETECTED Final   Parainfluenza Virus 1 NOT DETECTED NOT DETECTED Final   Parainfluenza Virus 2 NOT DETECTED NOT DETECTED Final   Parainfluenza Virus 3 NOT DETECTED NOT DETECTED Final   Parainfluenza Virus 4 NOT DETECTED NOT DETECTED Final   Respiratory Syncytial Virus NOT DETECTED NOT DETECTED Final   Bordetella pertussis NOT DETECTED NOT DETECTED Final   Bordetella Parapertussis NOT DETECTED NOT DETECTED Final   Chlamydophila pneumoniae NOT DETECTED NOT DETECTED Final   Mycoplasma pneumoniae NOT DETECTED NOT DETECTED Final    Comment: Performed at [facility], [place]. [address] [place], [place] [address]      Radiology Studies: DG CHEST PORT 1 VIEW  Result Date: 10/15/2021 CLINICAL DATA:  LEFT pleural effusion EXAM: PORTABLE CHEST 1 VIEW COMPARISON:  Portable exam 1448 hours compared to 10/12/2021 FINDINGS: Normal heart size and  mediastinal contours. Moderate LEFT pleural effusion significantly increased from prior exam. Persistent small RIGHT pleural effusion and basilar atelectasis. Upper lungs clear. No pneumothorax or acute osseous findings. Atherosclerotic calcification aortic arch. IMPRESSION: Bibasilar pleural effusions and atelectasis larger on LEFT, significantly increased on the LEFT since prior study. Aortic Atherosclerosis (ICD10-I70.0). Electronically Signed   By: Lavonia Dana M.D.   On: 10/15/2021 16:44       LOS: 7 days   [facility] Pager on [email or web address]  10/17/2021, 10:10 AM

## 2021-10-17 NOTE — H&P (View-Only) (Signed)
Progress Note  Patient Name: Jillian Cooley Date of Encounter: 10/17/2021  [facility] Cardiologist: Werner Lean, MD   Subjective   Breathing substantially better. Lying with HOB at 30 degrees. Net diuresis 1.1 liters with slight improvement in creatinine (2.1 to 1.9) in last 24h. Weight has changed with transfer to new hospital room (increased 2kg!), down one pound overnight.  Inpatient Medications    Scheduled Meds:  aspirin EC  81 mg Oral Daily   atorvastatin  20 mg Oral Daily   Chlorhexidine Gluconate Cloth  6 each Topical Daily   famotidine  20 mg Oral Daily   furosemide  80 mg Intravenous Q12H   hydrALAZINE  25 mg Oral Q8H   hydrocortisone sod succinate (SOLU-CORTEF) inj  50 mg Intravenous Daily   mouth rinse  15 mL Mouth Rinse BID   multivitamin with minerals  1 tablet Oral Daily   potassium chloride  20 mEq Oral TID   sodium chloride flush  3 mL Intravenous Q12H   Continuous Infusions:  sodium chloride Stopped (10/10/21 1902)   sodium chloride     heparin 850 Units/hr (10/16/21 2249)   PRN Meds: sodium chloride, acetaminophen, docusate sodium, ondansetron (ZOFRAN) IV, polyethylene glycol   Vital Signs    Vitals:   10/16/21 2349 10/17/21 0415 10/17/21 0549 10/17/21 0742  BP: 107/65 119/69 117/68 111/67  Pulse: 82 91 88 93  Resp: $Remo'14 15 13 17  'SeDUd$ Temp: 97.6 F (36.4 C) 97.6 F (36.4 C)  97.8 F (36.6 C)  TempSrc: Oral Oral  Oral  SpO2: 100% 99% 98% 97%  Weight:  55.4 kg    Height:        Intake/Output Summary (Last 24 hours) at 10/17/2021 0928 Last data filed at 10/17/2021 0426 Gross per 24 hour  Intake --  Output 1125 ml  Net -1125 ml   Last 3 Weights 10/17/2021 10/16/2021 10/16/2021  Weight (lbs) 122 lb 2.2 oz 123 lb 3.8 oz 118 lb 2.7 oz  Weight (kg) 55.4 kg 55.9 kg 53.6 kg      Telemetry    NSR, occ PVCs - Personally Reviewed  ECG    No new tracing - Personally Reviewed  Physical Exam  Appears weak and frail, but  able to lie almost supine without breathing difficulty GEN: No acute distress.   Neck: No JVD Cardiac: RRR, no murmurs, rubs, or gallops.  Respiratory: Dull to percussion and absent breath sounds in both bases, L worse than R GI: Soft, nontender, non-distended  MS: No edema; No deformity. Neuro:  Nonfocal  Psych: Normal affect   Labs    High Sensitivity Troponin:   Recent Labs  Lab 10/10/21 0731 10/10/21 0935 10/10/21 1548 10/10/21 1822 10/12/21 0252  TROPONINIHS 523* 836* 3,307* 5,378* 5,247*     Chemistry Recent Labs  Lab 10/10/21 1548 10/11/21 3606 10/12/21 0252 10/13/21 0258 10/14/21 0456 10/15/21 0255 10/16/21 0252 10/17/21 0329  NA 138 138 138   < > 141 135 138 137  K 4.2 4.1 3.8   < > 4.1 3.0* 4.5 4.1  CL 107 104 104   < > 108 103 106 104  CO2 18* 24 23   < > $R'23 23 25 26  'Eu$ GLUCOSE 105* 152* 133*   < > 126* 111* 118* 101*  BUN 73* 79* 83*   < > 86* 76* 76* 65*  CREATININE 1.93* 2.53* 2.80*   < > 2.12* 2.11* 2.01* 1.90*  CALCIUM 9.0 8.9 8.9   < >  8.8* 8.1* 8.6* 8.8*  MG 2.3 2.2 2.3  --  2.3  --   --   --   PROT 6.0*  --  5.8*  --   --   --   --   --   ALBUMIN 3.4*  --  3.2*  --   --   --   --   --   AST 32  --  16  --   --   --   --   --   ALT 30  --  25  --   --   --   --   --   ALKPHOS 45  --  40  --   --   --   --   --   BILITOT 0.8  --  0.7  --   --   --   --   --   GFRNONAA 25* 18* 16*   < > 22* 22* 24* 25*  ANIONGAP $RemoveB'13 10 11   'uedUAAAd$ < > $R'10 9 7 7   'Yp$ < > = values in this interval not displayed.    Lipids  Recent Labs  Lab 10/12/21 0252  CHOL 141  TRIG 61  HDL 45  LDLCALC 84  CHOLHDL 3.1    Hematology Recent Labs  Lab 10/15/21 0255 10/16/21 0252 10/17/21 0329  WBC 7.3 7.6 9.7  RBC 3.29* 3.47* 3.69*  HGB 9.7* 10.3* 10.9*  HCT 31.2* 32.8* 33.8*  MCV 94.8 94.5 91.6  MCH 29.5 29.7 29.5  MCHC 31.1 31.4 32.2  RDW 15.0 15.0 15.0  PLT 182 193 228   Thyroid No results for input(s): TSH, FREET4 in the last 168 hours.  BNP Recent Labs  Lab  10/16/21 0252  BNP 1,121.0*    DDimer No results for input(s): DDIMER in the last 168 hours.   Radiology    DG CHEST PORT 1 VIEW  Result Date: 10/15/2021 CLINICAL DATA:  LEFT pleural effusion EXAM: PORTABLE CHEST 1 VIEW COMPARISON:  Portable exam 1448 hours compared to 10/12/2021 FINDINGS: Normal heart size and mediastinal contours. Moderate LEFT pleural effusion significantly increased from prior exam. Persistent small RIGHT pleural effusion and basilar atelectasis. Upper lungs clear. No pneumothorax or acute osseous findings. Atherosclerotic calcification aortic arch. IMPRESSION: Bibasilar pleural effusions and atelectasis larger on LEFT, significantly increased on the LEFT since prior study. Aortic Atherosclerosis (ICD10-I70.0). Electronically Signed   By: Lavonia Dana M.D.   On: 10/15/2021 16:44    Cardiac Studies   Echocardiogram 10/10/2021      1. Normal LV function; mild AI; severe MR; elevated mean gradient across  MV of 5 mmHg likely at least partially related to severe MR; moderate TR.   2. Left ventricular ejection fraction, by estimation, is 60 to 65%. The  left ventricle has normal function. The left ventricle has no regional  wall motion abnormalities. Left ventricular diastolic parameters are  indeterminate.   3. Right ventricular systolic function is normal. The right ventricular  size is normal. There is moderately elevated pulmonary artery systolic  pressure.   4. Left atrial size was moderately dilated.   5. Large pleural effusion in the left lateral region.   6. The mitral valve is abnormal. Severe mitral valve regurgitation. Mild  mitral stenosis.   7. Tricuspid valve regurgitation is moderate.   8. The aortic valve has an indeterminant number of cusps. Aortic valve  regurgitation is mild. No aortic stenosis is present.   9. The inferior vena cava is  normal in size with <50% respiratory  variability, suggesting right atrial pressure of 8 mmHg.        Right  heart catheterization 10/14/2021   Conclusion   1. Right and left heart filling pressures are elevated.  2. Prominent v-wave suggestive of significant mitral regurgitation.  3. Cardiac output is normal.  4. Mixed pulmonary venous/pulmonary arterial HTN   Right Heart   Right Heart Pressures RHC Procedural Findings: Hemodynamics (mmHg) RA mean 11 RV 66/9 PA 74/29, mean 49 PCWP mean 25, prominent V-waves to 37  Oxygen saturations: PA 62% AO 100%  Cardiac Output (Fick) 4.1  Cardiac Index (Fick) 2.6 PVR 5.8 WU      Patient Profile     87 y.o. female with multiple myeloma, HTN, HLD, valvular heart disease (at least moderate, probably severe MR), CKD stage 4, carpal tunnel, bilateral DVT/PE, pleural effusion s/p repeated thoracentesis admitted 10/21 with hypoxic respiratory failure and hypotension, lactic acidosis, elevated troponin, treated with IVF and stress dose steroids. She underwent L sided thoracentesis on 10/21 (1.5L) and repeat L sided thoracentesis on 10/23 (1.2L). Cytology -ve for malignancy.  Right heart catheterization performed 10/13/2021 showed mixed findings of acute left heart failure and intrinsic pulmonary artery hypertension (consider secondary to pulm embolism).  Transferred to [facility] for TEE to assess if MitraClip is an option, possibly involve HF team if not.  Assessment & Plan    Acute diastolic heart failure: Secondary at least in large part to (probably) severe mitral insufficiency.  Mean PA wedge at cardiac catheterization was 25 mmHg with prominent V waves.  Despite IV diuretics, continues to have rapidly reaccumulating pleural effusion.  Renal function is stable.  In May her BNP was elevated at 530, on this admission was over 1000, now 1121.  Increasing  hydralazine gradually, but there is little room for treatment with vasodilators since her diastolic blood pressure is sometimes in the 50s.  Have avoided RAAS inhibitors due to renal dysfunction.  Discussed signs  and symptoms of heart for exacerbation, the importance of sodium restricted diet, daily weight monitoring. TEE today.   Mitral insufficiency: Described as moderate on outside echocardiogram performed in February (unable to review images), moderate (but possibly underestimated) by echo in May.  The transthoracic echocardiogram does not show images of the mitral valve with sufficient resolution to make a diagnosis, but the general impression is that the leaflets are thickened and somewhat restricted (possibly rheumatic disease).  Would need a transesophageal echo to assess them better.  If the initial impression is correct, unfortunately this would exclude MitraClip repair.  She is not a surgical candidate for mitral valve repair/replacement.  TEE today. PAH: Mixed etiology due to left heart failure (primarily due to mitral regurgitation) and intrinsic pulmonary arterial disease (possibly sequelae of previous pulmonary embolism).  Note that she had evidence of moderate to severe pulmonary hypertension by echocardiogram performed in February. Acute on chronic respiratory failure with hypoxia: Improved with thoracentesis and diuresis, but pleural effusion re-accumulated fast. Will benefit from another L thoracentesis, but she appears comfortable enough to have TEE even if we cannot get that done first. NSTEMI: She does not have any regional wall motion abnormalities on the echocardiogram, but it is quite possible that she has underlying CAD based on the degree of increase in cardiac enzymes.  She is not a candidate for invasive angiography and percutaneous revascularization or surgery.  It is quite reasonable to treat her with aggressive lipid-lowering therapy.  Recommend aspirin 81 mg daily, but  this should be stopped when she restarts treatment with Eliquis for her pulmonary embolism. PE: Although this occurred in the setting of postoperative state, it is very likely that she has a hypercoagulable condition due  to the hematological disorder.  Probably will remain on lifelong anticoagulation. Sinus pauses: No significant bradycardia.  Mostly due to blocked PACs. Chronic kidney disease stage IV: Renal parameters back to baseline, even slightly better after diuresis yesterday. Multiple myeloma: to discuss plans to resume therapy w he Oncologist.     For questions or updates, please contact [place] Please consult [email or web address] for contact info under        Signed, Sanda Klein, MD  10/17/2021, 9:28 AM

## 2021-10-17 NOTE — Progress Notes (Signed)
Echocardiogram Echocardiogram Transesophageal has been performed.  Oneal Deputy Janeliz Prestwood RDCS 10/17/2021, 3:06 PM

## 2021-10-17 NOTE — Care Management Important Message (Signed)
Important Message  Patient Details  Name: Jillian Cooley MRN: [id number] Date of Birth: 07-26-1932   Medicare Important Message Given:  Yes     Shelda Altes 10/17/2021, 11:07 AM

## 2021-10-17 NOTE — Progress Notes (Signed)
Progress Note  Patient Name: Jillian Cooley Date of Encounter: 10/17/2021  [facility] Cardiologist: Werner Lean, MD   Subjective   Breathing substantially better. Lying with HOB at 30 degrees. Net diuresis 1.1 liters with slight improvement in creatinine (2.1 to 1.9) in last 24h. Weight has changed with transfer to new hospital room (increased 2kg!), down one pound overnight.  Inpatient Medications    Scheduled Meds:  aspirin EC  81 mg Oral Daily   atorvastatin  20 mg Oral Daily   Chlorhexidine Gluconate Cloth  6 each Topical Daily   famotidine  20 mg Oral Daily   furosemide  80 mg Intravenous Q12H   hydrALAZINE  25 mg Oral Q8H   hydrocortisone sod succinate (SOLU-CORTEF) inj  50 mg Intravenous Daily   mouth rinse  15 mL Mouth Rinse BID   multivitamin with minerals  1 tablet Oral Daily   potassium chloride  20 mEq Oral TID   sodium chloride flush  3 mL Intravenous Q12H   Continuous Infusions:  sodium chloride Stopped (10/10/21 1902)   sodium chloride     heparin 850 Units/hr (10/16/21 2249)   PRN Meds: sodium chloride, acetaminophen, docusate sodium, ondansetron (ZOFRAN) IV, polyethylene glycol   Vital Signs    Vitals:   10/16/21 2349 10/17/21 0415 10/17/21 0549 10/17/21 0742  BP: 107/65 119/69 117/68 111/67  Pulse: 82 91 88 93  Resp: $Remo'14 15 13 17  'NYsar$ Temp: 97.6 F (36.4 C) 97.6 F (36.4 C)  97.8 F (36.6 C)  TempSrc: Oral Oral  Oral  SpO2: 100% 99% 98% 97%  Weight:  55.4 kg    Height:        Intake/Output Summary (Last 24 hours) at 10/17/2021 0928 Last data filed at 10/17/2021 0426 Gross per 24 hour  Intake --  Output 1125 ml  Net -1125 ml   Last 3 Weights 10/17/2021 10/16/2021 10/16/2021  Weight (lbs) 122 lb 2.2 oz 123 lb 3.8 oz 118 lb 2.7 oz  Weight (kg) 55.4 kg 55.9 kg 53.6 kg      Telemetry    NSR, occ PVCs - Personally Reviewed  ECG    No new tracing - Personally Reviewed  Physical Exam  Appears weak and frail, but  able to lie almost supine without breathing difficulty GEN: No acute distress.   Neck: No JVD Cardiac: RRR, no murmurs, rubs, or gallops.  Respiratory: Dull to percussion and absent breath sounds in both bases, L worse than R GI: Soft, nontender, non-distended  MS: No edema; No deformity. Neuro:  Nonfocal  Psych: Normal affect   Labs    High Sensitivity Troponin:   Recent Labs  Lab 10/10/21 0731 10/10/21 0935 10/10/21 1548 10/10/21 1822 10/12/21 0252  TROPONINIHS 523* 836* 3,307* 5,378* 5,247*     Chemistry Recent Labs  Lab 10/10/21 1548 10/11/21 2395 10/12/21 0252 10/13/21 0258 10/14/21 0456 10/15/21 0255 10/16/21 0252 10/17/21 0329  NA 138 138 138   < > 141 135 138 137  K 4.2 4.1 3.8   < > 4.1 3.0* 4.5 4.1  CL 107 104 104   < > 108 103 106 104  CO2 18* 24 23   < > $R'23 23 25 26  'HV$ GLUCOSE 105* 152* 133*   < > 126* 111* 118* 101*  BUN 73* 79* 83*   < > 86* 76* 76* 65*  CREATININE 1.93* 2.53* 2.80*   < > 2.12* 2.11* 2.01* 1.90*  CALCIUM 9.0 8.9 8.9   < >  8.8* 8.1* 8.6* 8.8*  MG 2.3 2.2 2.3  --  2.3  --   --   --   PROT 6.0*  --  5.8*  --   --   --   --   --   ALBUMIN 3.4*  --  3.2*  --   --   --   --   --   AST 32  --  16  --   --   --   --   --   ALT 30  --  25  --   --   --   --   --   ALKPHOS 45  --  40  --   --   --   --   --   BILITOT 0.8  --  0.7  --   --   --   --   --   GFRNONAA 25* 18* 16*   < > 22* 22* 24* 25*  ANIONGAP $RemoveB'13 10 11   'TEaVsPyh$ < > $R'10 9 7 7   'uv$ < > = values in this interval not displayed.    Lipids  Recent Labs  Lab 10/12/21 0252  CHOL 141  TRIG 61  HDL 45  LDLCALC 84  CHOLHDL 3.1    Hematology Recent Labs  Lab 10/15/21 0255 10/16/21 0252 10/17/21 0329  WBC 7.3 7.6 9.7  RBC 3.29* 3.47* 3.69*  HGB 9.7* 10.3* 10.9*  HCT 31.2* 32.8* 33.8*  MCV 94.8 94.5 91.6  MCH 29.5 29.7 29.5  MCHC 31.1 31.4 32.2  RDW 15.0 15.0 15.0  PLT 182 193 228   Thyroid No results for input(s): TSH, FREET4 in the last 168 hours.  BNP Recent Labs  Lab  10/16/21 0252  BNP 1,121.0*    DDimer No results for input(s): DDIMER in the last 168 hours.   Radiology    DG CHEST PORT 1 VIEW  Result Date: 10/15/2021 CLINICAL DATA:  LEFT pleural effusion EXAM: PORTABLE CHEST 1 VIEW COMPARISON:  Portable exam 1448 hours compared to 10/12/2021 FINDINGS: Normal heart size and mediastinal contours. Moderate LEFT pleural effusion significantly increased from prior exam. Persistent small RIGHT pleural effusion and basilar atelectasis. Upper lungs clear. No pneumothorax or acute osseous findings. Atherosclerotic calcification aortic arch. IMPRESSION: Bibasilar pleural effusions and atelectasis larger on LEFT, significantly increased on the LEFT since prior study. Aortic Atherosclerosis (ICD10-I70.0). Electronically Signed   By: Lavonia Dana M.D.   On: 10/15/2021 16:44    Cardiac Studies   Echocardiogram 10/10/2021      1. Normal LV function; mild AI; severe MR; elevated mean gradient across  MV of 5 mmHg likely at least partially related to severe MR; moderate TR.   2. Left ventricular ejection fraction, by estimation, is 60 to 65%. The  left ventricle has normal function. The left ventricle has no regional  wall motion abnormalities. Left ventricular diastolic parameters are  indeterminate.   3. Right ventricular systolic function is normal. The right ventricular  size is normal. There is moderately elevated pulmonary artery systolic  pressure.   4. Left atrial size was moderately dilated.   5. Large pleural effusion in the left lateral region.   6. The mitral valve is abnormal. Severe mitral valve regurgitation. Mild  mitral stenosis.   7. Tricuspid valve regurgitation is moderate.   8. The aortic valve has an indeterminant number of cusps. Aortic valve  regurgitation is mild. No aortic stenosis is present.   9. The inferior vena cava is  normal in size with <50% respiratory  variability, suggesting right atrial pressure of 8 mmHg.        Right  heart catheterization 10/14/2021   Conclusion   1. Right and left heart filling pressures are elevated.  2. Prominent v-wave suggestive of significant mitral regurgitation.  3. Cardiac output is normal.  4. Mixed pulmonary venous/pulmonary arterial HTN   Right Heart   Right Heart Pressures RHC Procedural Findings: Hemodynamics (mmHg) RA mean 11 RV 66/9 PA 74/29, mean 49 PCWP mean 25, prominent V-waves to 37  Oxygen saturations: PA 62% AO 100%  Cardiac Output (Fick) 4.1  Cardiac Index (Fick) 2.6 PVR 5.8 WU      Patient Profile     87 y.o. female with multiple myeloma, HTN, HLD, valvular heart disease (at least moderate, probably severe MR), CKD stage 4, carpal tunnel, bilateral DVT/PE, pleural effusion s/p repeated thoracentesis admitted 10/21 with hypoxic respiratory failure and hypotension, lactic acidosis, elevated troponin, treated with IVF and stress dose steroids. She underwent L sided thoracentesis on 10/21 (1.5L) and repeat L sided thoracentesis on 10/23 (1.2L). Cytology -ve for malignancy.  Right heart catheterization performed 10/13/2021 showed mixed findings of acute left heart failure and intrinsic pulmonary artery hypertension (consider secondary to pulm embolism).  Transferred to [facility] for TEE to assess if MitraClip is an option, possibly involve HF team if not.  Assessment & Plan    Acute diastolic heart failure: Secondary at least in large part to (probably) severe mitral insufficiency.  Mean PA wedge at cardiac catheterization was 25 mmHg with prominent V waves.  Despite IV diuretics, continues to have rapidly reaccumulating pleural effusion.  Renal function is stable.  In May her BNP was elevated at 530, on this admission was over 1000, now 1121.  Increasing  hydralazine gradually, but there is little room for treatment with vasodilators since her diastolic blood pressure is sometimes in the 50s.  Have avoided RAAS inhibitors due to renal dysfunction.  Discussed signs  and symptoms of heart for exacerbation, the importance of sodium restricted diet, daily weight monitoring. TEE today.   Mitral insufficiency: Described as moderate on outside echocardiogram performed in February (unable to review images), moderate (but possibly underestimated) by echo in May.  The transthoracic echocardiogram does not show images of the mitral valve with sufficient resolution to make a diagnosis, but the general impression is that the leaflets are thickened and somewhat restricted (possibly rheumatic disease).  Would need a transesophageal echo to assess them better.  If the initial impression is correct, unfortunately this would exclude MitraClip repair.  She is not a surgical candidate for mitral valve repair/replacement.  TEE today. PAH: Mixed etiology due to left heart failure (primarily due to mitral regurgitation) and intrinsic pulmonary arterial disease (possibly sequelae of previous pulmonary embolism).  Note that she had evidence of moderate to severe pulmonary hypertension by echocardiogram performed in February. Acute on chronic respiratory failure with hypoxia: Improved with thoracentesis and diuresis, but pleural effusion re-accumulated fast. Will benefit from another L thoracentesis, but she appears comfortable enough to have TEE even if we cannot get that done first. NSTEMI: She does not have any regional wall motion abnormalities on the echocardiogram, but it is quite possible that she has underlying CAD based on the degree of increase in cardiac enzymes.  She is not a candidate for invasive angiography and percutaneous revascularization or surgery.  It is quite reasonable to treat her with aggressive lipid-lowering therapy.  Recommend aspirin 81 mg daily, but  this should be stopped when she restarts treatment with Eliquis for her pulmonary embolism. PE: Although this occurred in the setting of postoperative state, it is very likely that she has a hypercoagulable condition due  to the hematological disorder.  Probably will remain on lifelong anticoagulation. Sinus pauses: No significant bradycardia.  Mostly due to blocked PACs. Chronic kidney disease stage IV: Renal parameters back to baseline, even slightly better after diuresis yesterday. Multiple myeloma: to discuss plans to resume therapy w he Oncologist.     For questions or updates, please contact [place] Please consult [email or web address] for contact info under        Signed, Sanda Klein, MD  10/17/2021, 9:28 AM

## 2021-10-17 NOTE — Anesthesia Postprocedure Evaluation (Signed)
Anesthesia Post Note  Patient: Jillian Cooley  Procedure(s) Performed: TRANSESOPHAGEAL ECHOCARDIOGRAM (TEE)     Patient location during evaluation: PACU Anesthesia Type: MAC Level of consciousness: awake and alert Pain management: pain level controlled Vital Signs Assessment: post-procedure vital signs reviewed and stable Respiratory status: spontaneous breathing, nonlabored ventilation and respiratory function stable Cardiovascular status: blood pressure returned to baseline and stable Postop Assessment: no apparent nausea or vomiting Anesthetic complications: no   No notable events documented.  Last Vitals:  Vitals:   10/17/21 1458 10/17/21 1500  BP: (!) 97/48 (!) 89/50  Pulse: (!) 102 85  Resp: 15 15  Temp:    SpO2: 100% 100%    Last Pain:  Vitals:   10/17/21 1450  TempSrc: Oral  PainSc: 0-No pain                 Pervis Hocking

## 2021-10-17 NOTE — Anesthesia Preprocedure Evaluation (Signed)
Anesthesia Evaluation  Patient identified by MRN, date of birth, ID band Patient awake    Reviewed: Allergy & Precautions, NPO status , Patient's Chart, lab work & pertinent test results  Airway Mallampati: II  TM Distance: >3 FB Neck ROM: Full    Dental no notable dental hx. (+) Dental Advisory Given   Pulmonary PE Recurrent L pleural effusion   Pulmonary exam normal breath sounds clear to auscultation       Cardiovascular hypertension, Pt. on medications pulmonary hypertension+ Past MI, +CHF (diastolic CHF- on home oxygen 2LPM when needed) and + DVT  Normal cardiovascular exam+ dysrhythmias (eliquis) Atrial Fibrillation + Valvular Problems/Murmurs (mild AI, mild MS, severe MR, mod TR) AI and MR  Rhythm:Regular Rate:Normal  TTE 09/2021: 1. Normal LV function; mild AI; severe MR; elevated mean gradient across  MV of 5 mmHg likely at least partially related to severe MR; moderate TR.  2. Left ventricular ejection fraction, by estimation, is 60 to 65%. The  left ventricle has normal function. The left ventricle has no regional  wall motion abnormalities. Left ventricular diastolic parameters are  indeterminate.  3. Right ventricular systolic function is normal. The right ventricular  size is normal. There is moderately elevated pulmonary artery systolic  pressure.  4. Left atrial size was moderately dilated.  5. Large pleural effusion in the left lateral region.  6. The mitral valve is abnormal. Severe mitral valve regurgitation. Mild mitral stenosis.  7. Tricuspid valve regurgitation is moderate.  8. The aortic valve has an indeterminant number of cusps. Aortic valve  regurgitation is mild. No aortic stenosis is present.  9. The inferior vena cava is normal in size with <50% respiratory  variability, suggesting right atrial pressure of 8 mmHg.   Cath 10/13/21: 1. Right and left heart filling pressures are elevated.   2. Prominent v-wave suggestive of significant mitral regurgitation.  3. Cardiac output is normal.  4. Mixed pulmonary venous/pulmonary arterial HTN    Neuro/Psych negative psych ROS   GI/Hepatic negative GI ROS, Neg liver ROS,   Endo/Other  negative endocrine ROS  Renal/GU Renal InsufficiencyRenal disease (CKD 3)Cr 1.9  negative genitourinary   Musculoskeletal  (+) Arthritis , Osteoarthritis,    Abdominal   Peds negative pediatric ROS (+)  Hematology negative hematology ROS (+) hct 33.8   Anesthesia Other Findings   Reproductive/Obstetrics negative OB ROS                             Anesthesia Physical Anesthesia Plan  ASA: 4  Anesthesia Plan: MAC   Post-op Pain Management:    Induction:   PONV Risk Score and Plan: 2 and Propofol infusion and TIVA  Airway Management Planned: Natural Airway and Simple Face Mask  Additional Equipment: None  Intra-op Plan:   Post-operative Plan:   Informed Consent: I have reviewed the patients History and Physical, chart, labs and discussed the procedure including the risks, benefits and alternatives for the proposed anesthesia with the patient or authorized representative who has indicated his/her understanding and acceptance.       Plan Discussed with: CRNA  Anesthesia Plan Comments:         Anesthesia Quick Evaluation

## 2021-10-17 NOTE — Interval H&P Note (Signed)
History and Physical Interval Note:  10/17/2021 1:45 PM  Jillian Cooley  has presented today for surgery, with the diagnosis of mitral valve regurgitation.  The various methods of treatment have been discussed with the patient and family. After consideration of risks, benefits and other options for treatment, the patient has consented to  Procedure(s): TRANSESOPHAGEAL ECHOCARDIOGRAM (TEE) (N/A) as a surgical intervention.  [name] history has been reviewed, patient examined, no change in status, stable for surgery.  I have reviewed the patient's chart and labs.  Questions were answered to the patient's satisfaction.     Elouise Munroe

## 2021-10-18 ENCOUNTER — Inpatient Hospital Stay (HOSPITAL_COMMUNITY): Payer: Medicare PPO

## 2021-10-18 DIAGNOSIS — I2699 Other pulmonary embolism without acute cor pulmonale: Secondary | ICD-10-CM | POA: Diagnosis not present

## 2021-10-18 DIAGNOSIS — J9621 Acute and chronic respiratory failure with hypoxia: Secondary | ICD-10-CM | POA: Diagnosis not present

## 2021-10-18 DIAGNOSIS — D649 Anemia, unspecified: Secondary | ICD-10-CM | POA: Diagnosis not present

## 2021-10-18 DIAGNOSIS — N178 Other acute kidney failure: Secondary | ICD-10-CM | POA: Diagnosis not present

## 2021-10-18 DIAGNOSIS — C9000 Multiple myeloma not having achieved remission: Secondary | ICD-10-CM | POA: Diagnosis not present

## 2021-10-18 DIAGNOSIS — I10 Essential (primary) hypertension: Secondary | ICD-10-CM

## 2021-10-18 DIAGNOSIS — I340 Nonrheumatic mitral (valve) insufficiency: Secondary | ICD-10-CM | POA: Diagnosis not present

## 2021-10-18 DIAGNOSIS — I5033 Acute on chronic diastolic (congestive) heart failure: Secondary | ICD-10-CM | POA: Diagnosis not present

## 2021-10-18 DIAGNOSIS — N179 Acute kidney failure, unspecified: Secondary | ICD-10-CM | POA: Diagnosis not present

## 2021-10-18 LAB — CBC
HCT: 35 % — ABNORMAL LOW (ref 36.0–46.0)
Hemoglobin: 11.3 g/dL — ABNORMAL LOW (ref 12.0–15.0)
MCH: 29.6 pg (ref 26.0–34.0)
MCHC: 32.3 g/dL (ref 30.0–36.0)
MCV: 91.6 fL (ref 80.0–100.0)
Platelets: 195 10*3/uL (ref 150–400)
RBC: 3.82 MIL/uL — ABNORMAL LOW (ref 3.87–5.11)
RDW: 15.3 % (ref 11.5–15.5)
WBC: 9.8 10*3/uL (ref 4.0–10.5)
nRBC: 0 % (ref 0.0–0.2)

## 2021-10-18 LAB — BASIC METABOLIC PANEL
Anion gap: 7 (ref 5–15)
BUN: 61 mg/dL — ABNORMAL HIGH (ref 8–23)
CO2: 25 mmol/L (ref 22–32)
Calcium: 8.9 mg/dL (ref 8.9–10.3)
Chloride: 104 mmol/L (ref 98–111)
Creatinine, Ser: 2 mg/dL — ABNORMAL HIGH (ref 0.44–1.00)
GFR, Estimated: 24 mL/min — ABNORMAL LOW (ref >60)
Glucose, Bld: 121 mg/dL — ABNORMAL HIGH (ref 70–99)
Potassium: 5 mmol/L (ref 3.5–5.1)
Sodium: 136 mmol/L (ref 135–145)

## 2021-10-18 LAB — MAGNESIUM: Magnesium: 2.1 mg/dL (ref 1.7–2.4)

## 2021-10-18 LAB — HEPARIN LEVEL (UNFRACTIONATED): Heparin Unfractionated: 0.39 IU/mL (ref 0.30–0.70)

## 2021-10-18 MED ORDER — FUROSEMIDE 40 MG PO TABS
80.0000 mg | ORAL_TABLET | Freq: Two times a day (BID) | ORAL | Status: DC
  Administered 2021-10-18 – 2021-10-19 (×4): 80 mg via ORAL
  Filled 2021-10-18 (×5): qty 2

## 2021-10-18 NOTE — Progress Notes (Signed)
[facility] PROGRESS NOTE   Jillian Cooley PJS:[id number] DOB: [birth year] DOA: 10/10/2021  PCP: [name], MD  Brief History/Interval Summary: 87 y.o. F with dCHF on home O2 PRN, recent Dx MM not yet started chemo, DVT and PE after rotator cuff surgery last May, on apixaban, as well as recurrent transudative pleural effusion (see below) who presented with slowly progressive SOB, finally severe.  Required BiPAP initially.  Was found to have a large left-sided pleural effusion.  Was admitted to the intensive care unit.  Underwent thoracentesis x2 during this hospital stay.  Seen by cardiology as well.  Underwent right heart catheterization.  Plan is for TEE.   Reason for Visit: Pleural effusion  Consultants: Pulmonology.  Cardiology  Procedures: Thoracentesis x2.  Right heart catheterization.   Subjective/Interval History: Patient mentions that after her thoracentesis yesterday her breathing has improved.  Denies any chest pain.  No nausea vomiting.  Daughter is at the bedside.     Assessment/Plan:  Acute on chronic respiratory failure with hypoxia Secondary to CHF along with pleural effusion.  Uses oxygen as needed at home.  Currently on 2 L of oxygen via nasal cannula with saturations in the mid to late 90s.  Left-sided pleural effusion Initially diagnosed in February.  Seen by pulmonology.  Has undergone multiple thoracentesis including twice during this hospital stay.  Analysis is consistent with a transudative process.  Thought to be secondary to cardiac process.   Patient underwent another thoracentesis on 10/28.  Symptomatically she appears to have improved.  Will benefit from another chest x-ray in the next 24 to 48 hours. Pulmonology to follow patient in their office. Hopefully with higher dose of diuretics her need for thoracentesis will decrease.  Acute on chronic diastolic CHF/NSTEMI/Mobitz type I AV block Has been on and off of diuretics during  this hospital stay mainly driven by creatinine and symptoms.   Renal function noted to be stable.  Patient was given high-dose diuretics yesterday.  Looks like cardiology has transitioned her to oral diuretics today.   Continue to monitor ins and outs and daily weights.   Patient was found to have elevated troponin levels though doubt acute coronary syndrome.   Patient is on aspirin and statin.  Not noted to be on beta-blocker at this time.  Not on ACE inhibitor or ARB due to elevated creatinine.  Severe mitral regurgitation Severe mitral regurgitation noted on TEE.  Further management per cardiology but it appears that patient may not be a candidate for any invasive interventions.  Acute kidney injury superimposed on chronic kidney disease stage IV Baseline creatinine is around 1.8-2.1.  It was up to 2.7.  Has been improving.  Stable around 2.0 for the last 3 days.  Monitor urine output.    History of bilateral pulmonary embolism diagnosed in May Was on apixaban.  Currently on IV heparin until all procedures have been completed.  Respiratory status is stable.  Hopefully transition to apixaban at the time of discharge.  Pulmonary artery hypertension Cardiology and pulmonology has been following.  Essential hypertension/hypokalemia Blood pressure is noted to be somewhat on the lower side.  Patient currently on furosemide and hydralazine.  Continue to monitor closely.  Potassium noted to be 5.0.  We will stop scheduled potassium.  Recheck labs tomorrow.  History of multiple myeloma Followed by [name] with oncology.  Has not been started on treatment yet due to her other comorbidities and acute issues.  Outpatient follow-up.  Normocytic anemia Stable.  No evidence of overt bleeding.  Severe protein calorie malnutrition Nutrition Problem: Severe Malnutrition Etiology: chronic illness  Signs/Symptoms: severe fat depletion, severe muscle depletion  Interventions: Magic cup,  MVI   DVT Prophylaxis: On IV heparin Code Status: Full code Family Communication: Discussed with patient and her daughter who was at bedside. Disposition Plan: To be determined.  PT and OT evaluation.  Status is: Inpatient  Remains inpatient appropriate because: Need for procedures that cannot be done in the outpatient setting and IV diuretics     Medications: Scheduled:  aspirin EC  81 mg Oral Daily   atorvastatin  20 mg Oral Daily   Chlorhexidine Gluconate Cloth  6 each Topical Daily   furosemide  80 mg Oral BID   hydrALAZINE  25 mg Oral Q8H   mouth rinse  15 mL Mouth Rinse BID   multivitamin with minerals  1 tablet Oral Daily   sodium chloride flush  3 mL Intravenous Q12H   Continuous:  sodium chloride Stopped (10/10/21 1902)   heparin 850 Units/hr (10/17/21 2117)   TFT:DDUKGU chloride, acetaminophen, docusate sodium, ondansetron (ZOFRAN) IV, polyethylene glycol  Antibiotics: Anti-infectives (From admission, onward)    Start     Dose/Rate Route Frequency Ordered Stop   10/10/21 1900  cefTRIAXone (ROCEPHIN) 2 g in sodium chloride 0.9 % 100 mL IVPB  Status:  Discontinued        2 g 200 mL/hr over 30 Minutes Intravenous Every 24 hours 10/10/21 1457 10/11/21 1039   10/10/21 1900  azithromycin (ZITHROMAX) 500 mg in sodium chloride 0.9 % 250 mL IVPB  Status:  Discontinued        500 mg 250 mL/hr over 60 Minutes Intravenous Every 24 hours 10/10/21 1457 10/11/21 1015   10/10/21 1045  piperacillin-tazobactam (ZOSYN) IVPB 3.375 g        3.375 g 100 mL/hr over 30 Minutes Intravenous  Once 10/10/21 1034 10/10/21 1255   10/10/21 1030  piperacillin-tazobactam (ZOSYN) IVPB 2.25 g  Status:  Discontinued        2.25 g 100 mL/hr over 30 Minutes Intravenous  Once 10/10/21 1017 10/10/21 1034       Objective:  Vital Signs  Vitals:   10/17/21 2011 10/17/21 2349 10/18/21 0335 10/18/21 0722  BP: 107/65 104/73 (!) 102/59 117/69  Pulse: (!) 106 98 82   Resp: _0 Temp: 97.8  F (36.6 C) 97.6 F (36.4 C)    TempSrc: Axillary Oral Axillary   SpO2: 98% 98% 100%   Weight:   52.6 kg   Height:        Intake/Output Summary (Last 24 hours) at 10/18/2021 1039 Last data filed at 10/17/2021 1700 Gross per 24 hour  Intake 250 ml  Output 700 ml  Net -450 ml    Filed Weights   10/17/21 0415 10/17/21 1300 10/18/21 0335  Weight: 55.4 kg 55.4 kg 52.6 kg    General appearance: Awake alert.  In no distress Resp: Diminished air entry at the bases.  No wheezing.  Few crackles. Cardio: S1-S2 is normal regular.  Systolic murmur over precordium GI: Abdomen is soft.  Nontender nondistended.  Bowel sounds are present normal.  No masses organomegaly Extremities: No edema.  Full range of motion of lower extremities.  Generalized physical deconditioning is noted Neurologic: Alert and oriented x3.  No focal neurological deficits.    Lab Results:  Data Reviewed: I have personally reviewed following labs and imaging studies  CBC: Recent Labs  Lab 10/14/21  3016 10/15/21 0255 10/16/21 0252 10/17/21 0329 10/18/21 0152  WBC 5.8 7.3 7.6 9.7 9.8  HGB 10.1* 9.7* 10.3* 10.9* 11.3*  HCT 32.1* 31.2* 32.8* 33.8* 35.0*  MCV 94.1 94.8 94.5 91.6 91.6  PLT 176 182 193 228 195     Basic Metabolic Panel: Recent Labs  Lab 10/12/21 0252 10/13/21 0258 10/14/21 0456 10/15/21 0255 10/16/21 0252 10/17/21 0329 10/18/21 0152  NA 138   < > 141 135 138 137 136  K 3.8   < > 4.1 3.0* 4.5 4.1 5.0  CL 104   < > 108 103 106 104 104  CO2 23   < > _0 GLUCOSE 133*   < > 126* 111* 118* 101* 121*  BUN 83*   < > 86* 76* 76* 65* 61*  CREATININE 2.80*   < > 2.12* 2.11* 2.01* 1.90* 2.00*  CALCIUM 8.9   < > 8.8* 8.1* 8.6* 8.8* 8.9  MG 2.3  --  2.3  --   --   --  2.1  PHOS 5.9*  --   --   --   --   --   --    < > = values in this interval not displayed.     GFR: Estimated Creatinine Clearance: 16.4 mL/min (A) (by C-G formula based on SCr of 2 mg/dL (H)).  Liver Function  Tests: Recent Labs  Lab 10/12/21 0252  AST 16  ALT 25  ALKPHOS 40  BILITOT 0.7  PROT 5.8*  ALBUMIN 3.2*      Recent Results (from the past 240 hour(s))  Resp Panel by RT-PCR (Flu A&B, Covid) Nasopharyngeal Swab     Status: None   Collection Time: 10/10/21  8:57 AM   Specimen: Nasopharyngeal Swab; Nasopharyngeal(NP) swabs in vial transport medium  Result Value Ref Range Status   SARS Coronavirus 2 by RT PCR NEGATIVE NEGATIVE Final    Comment: (NOTE) SARS-CoV-2 target nucleic acids are NOT DETECTED.  The SARS-CoV-2 RNA is generally detectable in upper respiratory specimens during the acute phase of infection. The lowest concentration of SARS-CoV-2 viral copies this assay can detect is 138 copies/mL. A negative result does not preclude SARS-Cov-2 infection and should not be used as the sole basis for treatment or other patient management decisions. A negative result may occur with  improper specimen collection/handling, submission of specimen other than nasopharyngeal swab, presence of viral mutation(s) within the areas targeted by this assay, and inadequate number of viral copies(<138 copies/mL). A negative result must be combined with clinical observations, patient history, and epidemiological information. The expected result is Negative.  Fact Sheet for Patients:  EntrepreneurPulse.com.au  Fact Sheet for Healthcare Providers:  IncredibleEmployment.be  This test is no t yet approved or cleared by the [place] FDA and  has been authorized for detection and/or diagnosis of SARS-CoV-2 by FDA under an Emergency Use Authorization (EUA). This EUA will remain  in effect (meaning this test can be used) for the duration of the COVID-19 declaration under Section 564(b)(1) of the Act, 21 U.S.C.section 360bbb-3(b)(1), unless the authorization is terminated  or revoked sooner.       Influenza A by PCR NEGATIVE NEGATIVE Final   Influenza B  by PCR NEGATIVE NEGATIVE Final    Comment: (NOTE) The Xpert Xpress SARS-CoV-2/FLU/RSV plus assay is intended as an aid in the diagnosis of influenza from Nasopharyngeal swab specimens and should not be used as a sole basis for treatment. Nasal washings and aspirates are  unacceptable for Xpert Xpress SARS-CoV-2/FLU/RSV testing.  Fact Sheet for Patients: EntrepreneurPulse.com.au  Fact Sheet for Healthcare Providers: IncredibleEmployment.be  This test is not yet approved or cleared by the [place] FDA and has been authorized for detection and/or diagnosis of SARS-CoV-2 by FDA under an Emergency Use Authorization (EUA). This EUA will remain in effect (meaning this test can be used) for the duration of the COVID-19 declaration under Section 564(b)(1) of the Act, 21 U.S.C. section 360bbb-3(b)(1), unless the authorization is terminated or revoked.  Performed at [facility], [place] [address], [place], [place] [address]   Culture, blood (routine x 2)     Status: None   Collection Time: 10/10/21 10:08 AM   Specimen: BLOOD  Result Value Ref Range Status   Specimen Description   Final    BLOOD RIGHT ANTECUBITAL Performed at [place] [address] [place], [place] [address]    Special Requests   Final    BOTTLES DRAWN AEROBIC AND ANAEROBIC Blood Culture results may not be optimal due to an inadequate volume of blood received in culture bottles Performed at [facility] [address], [place], [place] [address]    Culture   Final    NO GROWTH 5 DAYS Performed at [facility], [place] [address], [place], [place] [address]    Report Status 10/15/2021 FINAL  Final  Body fluid culture w Gram Stain     Status: None   Collection Time: 10/10/21  2:21 PM   Specimen: PATH Cytology Pleural fluid  Result Value Ref Range Status   Specimen Description   Final    PLEURAL Performed at [place] [address] [place], [place] [address]    Special Requests   Final    NONE Performed at [facility], [place] [address] [place], [place] [address]    Gram Stain   Final    NO SQUAMOUS EPITHELIAL CELLS SEEN FEW WBC SEEN NO ORGANISMS SEEN    Culture   Final    NO GROWTH 3 DAYS Performed at [facility], [place] [address] [place], [place] [address]    Report Status 10/14/2021 FINAL  Final  MRSA Next Gen by PCR, Nasal     Status: None   Collection Time: 10/10/21  5:34 PM   Specimen: Nasal Mucosa; Nasal Swab  Result Value Ref Range Status   MRSA by PCR Next Gen NOT DETECTED NOT DETECTED Final    Comment: (NOTE) The GeneXpert MRSA Assay (FDA approved for NASAL specimens only), is one component of a comprehensive MRSA colonization surveillance program. It is not intended to diagnose MRSA infection nor to guide or monitor treatment for MRSA infections. Test performance is not FDA approved in patients less than 3 years old. Performed at [facility], [place] [address] [place], [place] [address]   Culture, blood (routine x 2)     Status: None   Collection Time: 10/10/21  6:22 PM   Specimen: BLOOD LEFT FOREARM  Result Value Ref Range Status   Specimen Description   Final    BLOOD LEFT FOREARM Performed at [place] [address], [place], [place] [address]    Special Requests   Final    BOTTLES DRAWN AEROBIC ONLY Blood Culture adequate volume Performed at [place] [address] [place],  [address]    Culture   Final    NO GROWTH 5  DAYS Performed at [facility], [place] [address], [place], [place] [address]    Report Status 10/15/2021 FINAL  Final  Respiratory (~20 pathogens) panel by PCR     Status: None   Collection Time: 10/11/21 10:29 AM   Specimen: Nasopharyngeal Swab; Respiratory  Result Value Ref Range Status   Adenovirus NOT DETECTED NOT  DETECTED Final   Coronavirus 229E NOT DETECTED NOT DETECTED Final    Comment: (NOTE) The Coronavirus on the Respiratory Panel, DOES NOT test for the novel  Coronavirus (2019 nCoV)    Coronavirus HKU1 NOT DETECTED NOT DETECTED Final   Coronavirus NL63 NOT DETECTED NOT DETECTED Final   Coronavirus OC43 NOT DETECTED NOT DETECTED Final   Metapneumovirus NOT DETECTED NOT DETECTED Final   Rhinovirus / Enterovirus NOT DETECTED NOT DETECTED Final   Influenza A NOT DETECTED NOT DETECTED Final   Influenza B NOT DETECTED NOT DETECTED Final   Parainfluenza Virus 1 NOT DETECTED NOT DETECTED Final   Parainfluenza Virus 2 NOT DETECTED NOT DETECTED Final   Parainfluenza Virus 3 NOT DETECTED NOT DETECTED Final   Parainfluenza Virus 4 NOT DETECTED NOT DETECTED Final   Respiratory Syncytial Virus NOT DETECTED NOT DETECTED Final   Bordetella pertussis NOT DETECTED NOT DETECTED Final   Bordetella Parapertussis NOT DETECTED NOT DETECTED Final   Chlamydophila pneumoniae NOT DETECTED NOT DETECTED Final   Mycoplasma pneumoniae NOT DETECTED NOT DETECTED Final    Comment: Performed at [facility], [place]. [address], [place], [place] [address]       Radiology Studies: DG Chest 1 View  Result Date: 10/17/2021 CLINICAL DATA:  Left thoracentesis. EXAM: CHEST  1 VIEW COMPARISON:  10/15/2021 FINDINGS: Interval decrease in left pleural effusion with only trace left pleural effusion visible on the current study. No left-sided pneumothorax. Similar appearance right base collapse/consolidation with small right pleural effusion. The cardio pericardial silhouette is enlarged. The visualized bony structures of the thorax show no acute abnormality. Telemetry leads overlie the chest. IMPRESSION: Interval decrease in left pleural effusion without evidence for pneumothorax. Electronically Signed   By: Misty Stanley M.D.   On: 10/17/2021 16:17   IR THORACENTESIS ASP PLEURAL SPACE W/IMG GUIDE  Result Date:  10/17/2021 INDICATION: Recurrent pleural effusion EXAM: ULTRASOUND GUIDED LEFT THORACENTESIS MEDICATIONS: None. COMPLICATIONS: None immediate. PROCEDURE: An ultrasound guided thoracentesis was thoroughly discussed with the patient and questions answered. The benefits, risks, alternatives and complications were also discussed. The patient understands and wishes to proceed with the procedure. Written consent was obtained. Ultrasound was performed to localize and mark an adequate pocket of fluid in the left chest. The area was then prepped and draped in the normal sterile fashion. 1% Lidocaine was used for local anesthesia. A 6 Fr Safe-T-Centesis catheter was introduced. Thoracentesis was performed. The catheter was removed and a dressing applied. FINDINGS: A total of approximately 1.6L of red tinged pleural fluid was removed. IMPRESSION: Successful ultrasound guided left thoracentesis yielding 1.6L of pleural fluid. Electronically Signed   By: Miachel Roux M.D.   On: 10/17/2021 12:19       LOS: 8 days   Ilias Stcharles [employer] on [email or web address]  10/18/2021, 10:39 AM

## 2021-10-18 NOTE — Progress Notes (Signed)
Pt had a 27-beat-run of V-tach @ 0005, resting comfortably with no complaint of chest pain or shortness of breath. Will continue to monitor & Alcario Drought, MD made aware.   Elaina Hoops, RN

## 2021-10-18 NOTE — Progress Notes (Addendum)
Progress Note  Patient Name: Jillian Cooley Date of Encounter: 10/18/2021  [facility] Cardiologist: Werner Lean, MD   Subjective   Breathing continues to improve.  Creatinine bumped slightly today from 1.9-2.0 with diuresis.  Put out 700 cc yesterday and is net -2.1 L since admission.  Weight is down 6 pounds from yesterday to 116lbs today (she tells me her usual weight at home is around 120lbs) Inpatient Medications    Scheduled Meds:  aspirin EC  81 mg Oral Daily   atorvastatin  20 mg Oral Daily   Chlorhexidine Gluconate Cloth  6 each Topical Daily   furosemide  80 mg Intravenous Q12H   hydrALAZINE  25 mg Oral Q8H   mouth rinse  15 mL Mouth Rinse BID   multivitamin with minerals  1 tablet Oral Daily   sodium chloride flush  3 mL Intravenous Q12H   Continuous Infusions:  sodium chloride Stopped (10/10/21 1902)   heparin 850 Units/hr (10/17/21 2117)   PRN Meds: sodium chloride, acetaminophen, docusate sodium, ondansetron (ZOFRAN) IV, polyethylene glycol   Vital Signs    Vitals:   10/17/21 1538 10/17/21 2011 10/17/21 2349 10/18/21 0335  BP: 98/60 107/65 104/73 (!) 102/59  Pulse: 92 (!) 106 98 82  Resp: $Remo'15 17 16 12  'lPPqP$ Temp: (!) 97.5 F (36.4 C) 97.8 F (36.6 C) 97.6 F (36.4 C)   TempSrc: Oral Axillary Oral Axillary  SpO2: 100% 98% 98% 100%  Weight:    52.6 kg  Height:        Intake/Output Summary (Last 24 hours) at 10/18/2021 0708 Last data filed at 10/17/2021 1700 Gross per 24 hour  Intake 250 ml  Output 700 ml  Net -450 ml    Last 3 Weights 10/18/2021 10/17/2021 10/17/2021  Weight (lbs) 115 lb 15.4 oz 122 lb 2.2 oz 122 lb 2.2 oz  Weight (kg) 52.6 kg 55.4 kg 55.4 kg      Telemetry    Normal sinus rhythm with PVCs- Personally Reviewed  ECG    No new EKG to review- Personally Reviewed  Physical Exam  GEN: Thin and frail appearing in no acute distress HEENT: Normal NECK: No JVD; No carotid bruits LYMPHATICS: No  lymphadenopathy CARDIAC:RRR, no murmurs, rubs, gallops RESPIRATORY decreased BS at bases ABDOMEN: Soft, non-tender, non-distended MUSCULOSKELETAL:  No edema; No deformity  SKIN: Warm and dry NEUROLOGIC:  Alert and oriented x 3 PSYCHIATRIC:  Normal affect   Labs    High Sensitivity Troponin:   Recent Labs  Lab 10/10/21 0731 10/10/21 0935 10/10/21 1548 10/10/21 1822 10/12/21 0252  TROPONINIHS 523* 836* 3,307* 5,378* 5,247*      Chemistry Recent Labs  Lab 10/12/21 0252 10/13/21 0258 10/14/21 0456 10/15/21 0255 10/16/21 0252 10/17/21 0329 10/18/21 0152  NA 138   < > 141   < > 138 137 136  K 3.8   < > 4.1   < > 4.5 4.1 5.0  CL 104   < > 108   < > 106 104 104  CO2 23   < > 23   < > $R'25 26 25  'IR$ GLUCOSE 133*   < > 126*   < > 118* 101* 121*  BUN 83*   < > 86*   < > 76* 65* 61*  CREATININE 2.80*   < > 2.12*   < > 2.01* 1.90* 2.00*  CALCIUM 8.9   < > 8.8*   < > 8.6* 8.8* 8.9  MG 2.3  --  2.3  --   --   --  2.1  PROT 5.8*  --   --   --   --   --   --   ALBUMIN 3.2*  --   --   --   --   --   --   AST 16  --   --   --   --   --   --   ALT 25  --   --   --   --   --   --   ALKPHOS 40  --   --   --   --   --   --   BILITOT 0.7  --   --   --   --   --   --   GFRNONAA 16*   < > 22*   < > 24* 25* 24*  ANIONGAP 11   < > 10   < > $R'7 7 7   'qh$ < > = values in this interval not displayed.     Lipids  Recent Labs  Lab 10/12/21 0252  CHOL 141  TRIG 61  HDL 45  LDLCALC 84  CHOLHDL 3.1     Hematology Recent Labs  Lab 10/16/21 0252 10/17/21 0329 10/18/21 0152  WBC 7.6 9.7 9.8  RBC 3.47* 3.69* 3.82*  HGB 10.3* 10.9* 11.3*  HCT 32.8* 33.8* 35.0*  MCV 94.5 91.6 91.6  MCH 29.7 29.5 29.6  MCHC 31.4 32.2 32.3  RDW 15.0 15.0 15.3  PLT 193 228 195    Thyroid No results for input(s): TSH, FREET4 in the last 168 hours.  BNP Recent Labs  Lab 10/16/21 0252  BNP 1,121.0*     DDimer No results for input(s): DDIMER in the last 168 hours.   Radiology    DG Chest 1  View  Result Date: 10/17/2021 CLINICAL DATA:  Left thoracentesis. EXAM: CHEST  1 VIEW COMPARISON:  10/15/2021 FINDINGS: Interval decrease in left pleural effusion with only trace left pleural effusion visible on the current study. No left-sided pneumothorax. Similar appearance right base collapse/consolidation with small right pleural effusion. The cardio pericardial silhouette is enlarged. The visualized bony structures of the thorax show no acute abnormality. Telemetry leads overlie the chest. IMPRESSION: Interval decrease in left pleural effusion without evidence for pneumothorax. Electronically Signed   By: Misty Stanley M.D.   On: 10/17/2021 16:17   IR THORACENTESIS ASP PLEURAL SPACE W/IMG GUIDE  Result Date: 10/17/2021 INDICATION: Recurrent pleural effusion EXAM: ULTRASOUND GUIDED LEFT THORACENTESIS MEDICATIONS: None. COMPLICATIONS: None immediate. PROCEDURE: An ultrasound guided thoracentesis was thoroughly discussed with the patient and questions answered. The benefits, risks, alternatives and complications were also discussed. The patient understands and wishes to proceed with the procedure. Written consent was obtained. Ultrasound was performed to localize and mark an adequate pocket of fluid in the left chest. The area was then prepped and draped in the normal sterile fashion. 1% Lidocaine was used for local anesthesia. A 6 Fr Safe-T-Centesis catheter was introduced. Thoracentesis was performed. The catheter was removed and a dressing applied. FINDINGS: A total of approximately 1.6L of red tinged pleural fluid was removed. IMPRESSION: Successful ultrasound guided left thoracentesis yielding 1.6L of pleural fluid. Electronically Signed   By: Miachel Roux M.D.   On: 10/17/2021 12:19    Cardiac Studies   Echocardiogram 10/10/2021      1. Normal LV function; mild AI; severe MR; elevated mean gradient across  MV of 5 mmHg likely at  least partially related to severe MR; moderate TR.   2. Left  ventricular ejection fraction, by estimation, is 60 to 65%. The  left ventricle has normal function. The left ventricle has no regional  wall motion abnormalities. Left ventricular diastolic parameters are  indeterminate.   3. Right ventricular systolic function is normal. The right ventricular  size is normal. There is moderately elevated pulmonary artery systolic  pressure.   4. Left atrial size was moderately dilated.   5. Large pleural effusion in the left lateral region.   6. The mitral valve is abnormal. Severe mitral valve regurgitation. Mild  mitral stenosis.   7. Tricuspid valve regurgitation is moderate.   8. The aortic valve has an indeterminant number of cusps. Aortic valve  regurgitation is mild. No aortic stenosis is present.   9. The inferior vena cava is normal in size with <50% respiratory  variability, suggesting right atrial pressure of 8 mmHg.        Right heart catheterization 10/14/2021   Conclusion   1. Right and left heart filling pressures are elevated.  2. Prominent v-wave suggestive of significant mitral regurgitation.  3. Cardiac output is normal.  4. Mixed pulmonary venous/pulmonary arterial HTN   Right Heart   Right Heart Pressures RHC Procedural Findings: Hemodynamics (mmHg) RA mean 11 RV 66/9 PA 74/29, mean 49 PCWP mean 25, prominent V-waves to 37  Oxygen saturations: PA 62% AO 100%  Cardiac Output (Fick) 4.1  Cardiac Index (Fick) 2.6 PVR 5.8 WU      Patient Profile     87 y.o. female with multiple myeloma, HTN, HLD, valvular heart disease (at least moderate, probably severe MR), CKD stage 4, carpal tunnel, bilateral DVT/PE, pleural effusion s/p repeated thoracentesis admitted 10/21 with hypoxic respiratory failure and hypotension, lactic acidosis, elevated troponin, treated with IVF and stress dose steroids. She underwent L sided thoracentesis on 10/21 (1.5L) and repeat L sided thoracentesis on 10/23 (1.2L). Cytology -ve for  malignancy.  Right heart catheterization performed 10/13/2021 showed mixed findings of acute left heart failure and intrinsic pulmonary artery hypertension (consider secondary to pulm embolism).  Transferred to [facility] for TEE to assess if MitraClip is an option, possibly involve HF team if not.  Assessment & Plan    Acute diastolic heart failure: Secondary at least in large part to (probably) severe mitral insufficiency.  Mean PA wedge at cardiac catheterization was 25 mmHg with prominent V waves.  Despite IV diuretics, continues to have rapidly reaccumulating pleural effusion.  Renal function is stable.  In May her BNP was elevated at 530, on this admission was over 1000, now 1121.   -Have continued to increase hydralazine for afterload reduction but due to low diastolic blood pressure there is been little room to increase dose  -Have avoided RAAS inhibitors due to renal dysfunction.   -Discussed signs and symptoms of heart for exacerbation, the importance of sodium restricted diet, daily weight monitoring.  -TEE was performed yesterday and final report is pending but showed severe thickening of the mitral valve with at least mild mitral stenosis with mean gradient 5 mmHg at a heart rate of 60 and severe mitral regurgitation.  It was felt that the mechanism of MR was bileaflet restriction with poor commissural coaptation and felt not to be a suitable candidate for MitraClip given her diastolic mitral gradient and bileaflet thickening -We will discuss further with structural heart team -he weight is now down 6lbs to 115lbs and she normally runs around 120lbs at home -  will change Lasix to $Remove'80mg'JVTZbWN$  BID PO -follow renal function closely -will repeat Cxray today  Mitral insufficiency: Described as moderate on outside echocardiogram performed in February (unable to review images), moderate (but possibly underestimated) by echo in May.   -She is not a surgical candidate for mitral valve repair/replacement.  -TEE  yesterday showed severe thickening of the mitral valve leaflets with at least mild mitral stenosis with mean gradient 5 mmHg and severe mitral regurgitation with mechanism of MR being bileaflet restriction with poor commissural coaptation unfortunately likely not a candidate for MitraClip given her diastolic mitral gradient and bileaflet thickening -We will discuss further with structural heart team  PAH: Mixed etiology due to left heart failure (primarily due to mitral regurgitation) and intrinsic pulmonary arterial disease (possibly sequelae of previous pulmonary embolism).  Note that she had evidence of moderate to severe pulmonary hypertension by echocardiogram performed in February. -Continue diuretics with Lasix 80 mg IV twice daily  Acute on chronic respiratory failure with hypoxia: Improved with thoracentesis and diuresis, but pleural effusion re-accumulated fast.  -Chest x-ray yesterday showed decrease in size of the left pleural effusion  NSTEMI: She does not have any regional wall motion abnormalities on the echocardiogram, but it is quite possible that she has underlying CAD based on the degree of increase in cardiac enzymes.   -She is not a candidate for invasive angiography and percutaneous revascularization or surgery.   -It is quite reasonable to treat her with aggressive lipid-lowering therapy.   -Continue aspirin 81 mg daily but this should be stopped when she restarts treatment with Eliquis for her pulmonary embolism  6.  PE: Although this occurred in the setting of postoperative state, it is very likely that she has a hypercoagulable condition due to the hematological disorder.   -Currently on IV heparin until it is deemed that her pleural effusions are stable and will require no further thoracentesis and then restart Eliquis -Probably will remain on lifelong anticoagulation.  7.  Sinus pauses: No significant bradycardia on telemetry overnight and mostly blocked PACs  8.  Chronic kidney disease stage IV: Renal parameters back to baseline and serum creatinine is hovering around 1.9-2   9.  multiple myeloma: to discuss plans to resume therapy w he Oncologist.    I have spent a total of 30 minutes with patient reviewing TEE, right heart cath,, telemetry, EKGs, labs and examining patient as well as establishing an assessment and plan that was discussed with the patient.  > 50% of time was spent in direct patient care.     For questions or updates, please contact [place] Please consult [email or web address] for contact info under        Signed, Fransico Him, MD  10/18/2021, 7:08 AM

## 2021-10-18 NOTE — Evaluation (Signed)
Occupational Therapy Evaluation Patient Details Name: Jillian Cooley MRN: [id number] DOB: 1932/12/01 Today's Date: 10/18/2021   History of Present Illness Pt is 87 yo female who presented with slowly progressive SOB, finally severe; required BiPAP initially. Found to have a large left-sided pleural effusion. Now s/p  Thoracentesis x2 and right heart catheterization. Pmh includes dCHF on home O2 PRN, recent Dx MM not yet started chemo, DVT and PE after rotator cuff surgery last May, on apixaban, as well as recurrent transudative pleural effusion.   Clinical Impression   Pt admitted with the above diagnoses and presents with below problem list. Pt will benefit from continued acute OT to address the below listed deficits and maximize independence with basic ADLs prior to d/c home. PTA pt was independent to mod I with ADLs. Pt currently needs up to min guard assist with LB ADLs in sit<>stand. Able to walk a short distance this session to access [facility] and then recliner. Pt reported some SOB. Pt SpO2 100 on 2L O2, did drop briefly with OOB activity then back to 100 SpP2; left on RA, nursing notified.        Recommendations for follow up therapy are one component of a multi-disciplinary discharge planning process, led by the attending physician.  Recommendations may be updated based on patient status, additional functional criteria and insurance authorization.   Follow Up Recommendations  Home health OT    Assistance Recommended at Discharge Intermittent Supervision/Assistance  Functional Status Assessment  Patient has had a recent decline in their functional status and demonstrates the ability to make significant improvements in function in a reasonable and predictable amount of time.  Equipment Recommendations  Other (comment) (TBD- may need shower seat)    Recommendations for Other Services PT consult     Precautions / Restrictions Precautions Precautions: Fall, low vision (macular  degeneration) Restrictions Weight Bearing Restrictions: No      Mobility Bed Mobility Overal bed mobility: Needs Assistance Bed Mobility: Supine to Sit     Supine to sit: Supervision;HOB elevated     General bed mobility comments: extra time and effort, no physical assist    Transfers Overall transfer level: Needs assistance Equipment used: None Transfers: Sit to/from Stand;Stand Pivot Transfers Sit to Stand: Supervision Stand pivot transfers: Supervision         General transfer comment: EOB to [facility] then walked to recliner. supervision for safety.      Balance Overall balance assessment: Needs assistance Sitting-balance support: Feet supported;No upper extremity supported Sitting balance-Leahy Scale: Good     Standing balance support: No upper extremity supported Standing balance-Leahy Scale: Fair                             ADL either performed or assessed with clinical judgement   ADL Overall ADL's : Needs assistance/impaired Eating/Feeding: Set up   Grooming: Set up;Sitting   Upper Body Bathing: Set up;Sitting   Lower Body Bathing: Min guard;Sit to/from stand   Upper Body Dressing : Set up;Sitting   Lower Body Dressing: Min guard;Sit to/from stand   Toilet Transfer: Min guard   Toileting- Water quality scientist and Hygiene: Min guard         General ADL Comments: Pt took pivotal steps to sit up in recliner for a bit. SOB with minimal exertion noted. No physical assist for transfers.     Vision Baseline Vision/History: 6 Macular Degeneration Additional Comments: does not drive. vision significantly impaired per  son's report     Perception     Praxis      Pertinent Vitals/Pain Pain Assessment: Faces Faces Pain Scale: No hurt     Hand Dominance     Extremity/Trunk Assessment Upper Extremity Assessment Upper Extremity Assessment: Generalized weakness;Overall [facility] for tasks assessed   Lower Extremity Assessment Lower  Extremity Assessment: Defer to PT evaluation       Communication Communication Communication: No difficulties   Cognition Arousal/Alertness: Awake/alert Behavior During Therapy: WFL for tasks assessed/performed Overall Cognitive Status: Within Functional Limits for tasks assessed                                       General Comments  Received on 2L O2 with sats 100 at rest, down briefly to 87 with OOB activity recovered quickly to upper 90s. Left on RA, nurse present.    Exercises     Shoulder Instructions      Home Living Family/patient expects to be discharged to:: Private residence Living Arrangements: Spouse/significant other Available Help at Discharge: Family Type of Home: House Home Access: Stairs to enter Technical brewer of Steps: 3 Entrance Stairs-Rails: Right;Left Home Layout: One level               Home Equipment: None          Prior Functioning/Environment Prior Level of Function : Independent/Modified Independent                        OT Problem List: Decreased strength;Decreased activity tolerance;Impaired balance (sitting and/or standing);Decreased knowledge of use of DME or AE;Decreased knowledge of precautions;Cardiopulmonary status limiting activity;Pain      OT Treatment/Interventions: Self-care/ADL training;Energy conservation;DME and/or AE instruction;Therapeutic activities;Patient/family education;Balance training    OT Goals(Current goals can be found in the care plan section) Acute Rehab OT Goals Patient Stated Goal: home, stay independent OT Goal Formulation: With patient/family Time For Goal Achievement: 11/01/21 Potential to Achieve Goals: Good ADL Goals Pt Will Perform Grooming: with modified independence;standing Pt Will Perform Upper Body Dressing: with modified independence;sitting Pt Will Perform Lower Body Dressing: with modified independence;sit to/from stand Pt Will Transfer to Toilet:  with modified independence;ambulating Pt Will Perform Toileting - Clothing Manipulation and hygiene: with modified independence;sit to/from stand Pt Will Perform Tub/Shower Transfer: Shower transfer;with modified independence;ambulating;shower seat  OT Frequency: Min 2X/week   Barriers to D/C:            Co-evaluation              AM-PAC OT "6 Clicks" Daily Activity     Outcome Measure Help from another person eating meals?: None Help from another person taking care of personal grooming?: A Little Help from another person toileting, which includes using toliet, bedpan, or urinal?: A Little Help from another person bathing (including washing, rinsing, drying)?: A Little Help from another person to put on and taking off regular upper body clothing?: None Help from another person to put on and taking off regular lower body clothing?: A Little 6 Click Score: 20   End of Session Equipment Utilized During Treatment: Oxygen (2L inititally then RA at end of session) Nurse Communication: Other (comment) (bleeding from IV site. trialing sats on RA, left on RA)  Activity Tolerance: Patient limited by fatigue;Other (comment) (SOB with minimal exertion) Patient left: in chair;with call bell/phone within reach;with nursing/sitter in room;with family/visitor present  OT Visit Diagnosis: Unsteadiness on feet (R26.81);Muscle weakness (generalized) (M62.81);Pain;Low vision, both eyes (H54.2)                Time: 3545-6256 OT Time Calculation (min): 25 min Charges:  OT General Charges $OT Visit: 1 Visit OT Evaluation $OT Eval Low Complexity: 1 Low OT Treatments $Self Care/Home Management : 8-22 mins  Tyrone Schimke, OT Acute Rehabilitation Services Pager: [phone] Office: [phone]   Hortencia Pilar 10/18/2021, 2:55 PM

## 2021-10-18 NOTE — Progress Notes (Signed)
ANTICOAGULATION CONSULT NOTE - follow up  Pharmacy Consult for Heparin (apixaban PTA) Indication: chest pain/ACS  Allergies  Allergen Reactions   Sulfa Antibiotics Nausea Only    Patient Measurements: Height: 5\' 3"  (160 cm) Weight: 52.6 kg (115 lb 15.4 oz) IBW/kg (Calculated) : 52.4 Heparin Dosing Weight: TBW  Vital Signs: Temp: 97.6 F (36.4 C) (10/28 2349) Temp Source: Axillary (10/29 0335) BP: 117/69 (10/29 0722) Pulse Rate: 82 (10/29 0335)  Labs: Recent Labs    10/16/21 0252 10/17/21 0329 10/18/21 0152  HGB 10.3* 10.9* 11.3*  HCT 32.8* 33.8* 35.0*  PLT 193 228 195  HEPARINUNFRC 0.59 0.56 0.39  CREATININE 2.01* 1.90* 2.00*     Estimated Creatinine Clearance: 16.4 mL/min (A) (by C-G formula based on SCr of 2 mg/dL (H)).  Medications:  Scheduled:   aspirin EC  81 mg Oral Daily   atorvastatin  20 mg Oral Daily   Chlorhexidine Gluconate Cloth  6 each Topical Daily   furosemide  80 mg Oral BID   hydrALAZINE  25 mg Oral Q8H   mouth rinse  15 mL Mouth Rinse BID   multivitamin with minerals  1 tablet Oral Daily   sodium chloride flush  3 mL Intravenous Q12H   Infusions:   sodium chloride Stopped (10/10/21 1902)   heparin 850 Units/hr (10/17/21 2117)    Assessment: 53 yoF presented to ED with ShOB.  Heparin per pharmacy for ACS, hx PE/DVT on apixaban.  PTA Eliquis held, last dose 10/20 at 18:00.  Patient will remain on heparin until it is deemed that her pleural effusions are stable and will not require further thoracentesis and then restart Eliquis. CXR was completed this morning but has not yet been read.    10/18/2021: Heparin level 0.39, therapeutic on Heparin 850 unit/hr. Heparin level did decrease from 0.56 yesterday to 0.39 today; however, patient has been therapeutic at this rate for 3 days.   CBC- Hgb stable at 11.3, pltc WNL No bleeding or infusion related concerns noted.    Goal of Therapy:  Heparin level 0.3-0.7 units/ml Monitor platelets by  anticoagulation protocol: Yes   Plan:  Continue heparin @ 850 units/hr. Daily heparin level and CBC while on heparin F/U plans to resume apixaban based on any further procedures  Pauletta Browns, Pharm.D. PGY-1 Pharmacy Resident [phone] 10/18/2021 8:09 AM

## 2021-10-18 NOTE — Evaluation (Signed)
Physical Therapy Evaluation Patient Details Name: Jillian Cooley MRN: [id number] DOB: 06/08/1932 Today's Date: 10/18/2021  History of Present Illness  Pt is an 87 y.o. female admitted 10/10/21 with severe SOB; workup for acute hypoxic respiratory failure requiring bipap. CXR with large L effusion. S/p thoracentesis 10/21, repeat 10/23. Ashley 10/24 showed normal CO, high filling pressures. PMH includes CHF (home O2 PRN), DVT/PE s/p rotator cuff repair (04/2021).   Clinical Impression  Pt presents with an overall decrease in functional mobility secondary to above. PTA, pt independent, lives with husband. Today, pt ambulatory with intermittent min guard for balance; stability improved with RW use. Pt limited by generalized weakness, decreased activity tolerance. Pt's children present and supportive; educ re: activity recommendations, DME use, fall risk reduction. Pt would benefit from continued acute PT services to maximize functional mobility and independence prior to d/c with HHPT services.   SpO2 >/92% on RA when pleth reliable HR up to 116 with activity     Recommendations for follow up therapy are one component of a multi-disciplinary discharge planning process, led by the attending physician.  Recommendations may be updated based on patient status, additional functional criteria and insurance authorization.  Follow Up Recommendations Home health PT    Assistance Recommended at Discharge Intermittent Supervision/Assistance  Functional Status Assessment Patient has had a recent decline in their functional status and demonstrates the ability to make significant improvements in function in a reasonable and predictable amount of time.  Equipment Recommendations  Rolling walker (2 wheels)    Recommendations for Other Services       Precautions / Restrictions Precautions Precautions: Fall Restrictions Weight Bearing Restrictions: No      Mobility  Bed Mobility Overal bed mobility:  Needs Assistance Bed Mobility: Supine to Sit     Supine to sit: Supervision;HOB elevated     General bed mobility comments: Received sitting in recliner    Transfers Overall transfer level: Needs assistance Equipment used: None;Rolling walker (2 wheels) Transfers: Sit to/from Stand Sit to Stand: Supervision Stand pivot transfers: Supervision         General transfer comment: Multiple sit<>stands without DME, then trial with RW, educ on hand placement    Ambulation/Gait Ambulation/Gait assistance: Min guard;Min assist;Supervision Gait Distance (Feet): 80 Feet (+ 104') Assistive device: None;Rolling walker (2 wheels) Gait Pattern/deviations: Step-through pattern;Decreased stride length;Trunk flexed Gait velocity: Decreased   General Gait Details: Slow, guarded, mildly unsteady gait without DME, min guard for balance with 1x LOB requiring minA to prevent fall, distance limied by fatigue; seated rest and additional gait training with RW, improved stability with RW, supervision for safety  Stairs            Wheelchair Mobility    Modified Rankin (Stroke Patients Only)       Balance Overall balance assessment: Needs assistance Sitting-balance support: Feet supported;No upper extremity supported Sitting balance-Leahy Scale: Good     Standing balance support: No upper extremity supported;During functional activity Standing balance-Leahy Scale: Fair                               Pertinent Vitals/Pain Pain Assessment: No/denies pain Faces Pain Scale: No hurt    Home Living Family/patient expects to be discharged to:: Private residence Living Arrangements: Spouse/significant other Available Help at Discharge: Family;Available 24 hours/day Type of Home: House Home Access: Stairs to enter Entrance Stairs-Rails: Psychiatric nurse of Steps: 3   Home Layout: One level  Home Equipment: None Additional Comments: Husband able to assist  as needed    Prior Function Prior Level of Function : Independent/Modified Independent             Mobility Comments: Independent without DME. Does not drive secondary to vision impairment (husband drives) ADLs Comments: Typically stands to shower     Hand Dominance        Extremity/Trunk Assessment   Upper Extremity Assessment Upper Extremity Assessment: Generalized weakness    Lower Extremity Assessment Lower Extremity Assessment: Generalized weakness       Communication   Communication: No difficulties  Cognition Arousal/Alertness: Awake/alert Behavior During Therapy: WFL for tasks assessed/performed Overall Cognitive Status: Within Functional Limits for tasks assessed                                          General Comments General comments (skin integrity, edema, etc.): SpO2 >/92% on RA when pleth reliable; HR up to 116 with activity. Pt's children present and supportive; increased time discussing d/c and educ re: activity recommendations, DME, fall risk reduction    Exercises     Assessment/Plan    PT Assessment Patient needs continued PT services  PT Problem List Decreased strength;Decreased activity tolerance;Decreased balance;Decreased mobility;Decreased knowledge of use of DME;Cardiopulmonary status limiting activity       PT Treatment Interventions DME instruction;Gait training;Stair training;Functional mobility training;Therapeutic activities;Therapeutic exercise;Balance training;Patient/family education    PT Goals (Current goals can be found in the Care Plan section)  Acute Rehab PT Goals Patient Stated Goal: return home PT Goal Formulation: With patient/family Time For Goal Achievement: 11/01/21 Potential to Achieve Goals: Good    Frequency Min 3X/week   Barriers to discharge        Co-evaluation               AM-PAC PT "6 Clicks" Mobility  Outcome Measure Help needed turning from your back to your side while  in a flat bed without using bedrails?: None Help needed moving from lying on your back to sitting on the side of a flat bed without using bedrails?: A Little Help needed moving to and from a bed to a chair (including a wheelchair)?: A Little Help needed standing up from a chair using your arms (e.g., wheelchair or bedside chair)?: A Little Help needed to walk in hospital room?: A Little Help needed climbing 3-5 steps with a railing? : A Little 6 Click Score: 19    End of Session Equipment Utilized During Treatment: Gait belt Activity Tolerance: Patient tolerated treatment well Patient left: in chair;with call bell/phone within reach;with family/visitor present;with nursing/sitter in room Nurse Communication: Mobility status PT Visit Diagnosis: Other abnormalities of gait and mobility (R26.89);Muscle weakness (generalized) (M62.81)    Time: 2725-3664 PT Time Calculation (min) (ACUTE ONLY): 21 min   Charges:   PT Evaluation $PT Eval Low Complexity: [place], PT, DPT Acute Rehabilitation Services  Pager [phone] Office [place] 10/18/2021, 3:54 PM

## 2021-10-19 DIAGNOSIS — N178 Other acute kidney failure: Secondary | ICD-10-CM | POA: Diagnosis not present

## 2021-10-19 DIAGNOSIS — I2699 Other pulmonary embolism without acute cor pulmonale: Secondary | ICD-10-CM | POA: Diagnosis not present

## 2021-10-19 DIAGNOSIS — J9621 Acute and chronic respiratory failure with hypoxia: Secondary | ICD-10-CM | POA: Diagnosis not present

## 2021-10-19 DIAGNOSIS — I5033 Acute on chronic diastolic (congestive) heart failure: Secondary | ICD-10-CM | POA: Diagnosis not present

## 2021-10-19 DIAGNOSIS — J90 Pleural effusion, not elsewhere classified: Secondary | ICD-10-CM | POA: Diagnosis not present

## 2021-10-19 DIAGNOSIS — D649 Anemia, unspecified: Secondary | ICD-10-CM | POA: Diagnosis not present

## 2021-10-19 LAB — HEPARIN LEVEL (UNFRACTIONATED): Heparin Unfractionated: 0.4 IU/mL (ref 0.30–0.70)

## 2021-10-19 LAB — CBC
HCT: 33.9 % — ABNORMAL LOW (ref 36.0–46.0)
Hemoglobin: 11 g/dL — ABNORMAL LOW (ref 12.0–15.0)
MCH: 29.9 pg (ref 26.0–34.0)
MCHC: 32.4 g/dL (ref 30.0–36.0)
MCV: 92.1 fL (ref 80.0–100.0)
Platelets: 219 10*3/uL (ref 150–400)
RBC: 3.68 MIL/uL — ABNORMAL LOW (ref 3.87–5.11)
RDW: 15.3 % (ref 11.5–15.5)
WBC: 8.8 10*3/uL (ref 4.0–10.5)
nRBC: 0.2 % (ref 0.0–0.2)

## 2021-10-19 LAB — BASIC METABOLIC PANEL
Anion gap: 8 (ref 5–15)
BUN: 65 mg/dL — ABNORMAL HIGH (ref 8–23)
CO2: 24 mmol/L (ref 22–32)
Calcium: 8.8 mg/dL — ABNORMAL LOW (ref 8.9–10.3)
Chloride: 101 mmol/L (ref 98–111)
Creatinine, Ser: 2.1 mg/dL — ABNORMAL HIGH (ref 0.44–1.00)
GFR, Estimated: 22 mL/min — ABNORMAL LOW (ref >60)
Glucose, Bld: 111 mg/dL — ABNORMAL HIGH (ref 70–99)
Potassium: 4.1 mmol/L (ref 3.5–5.1)
Sodium: 133 mmol/L — ABNORMAL LOW (ref 135–145)

## 2021-10-19 LAB — ECHO TEE
MV M vel: 4.76 m/s
MV Peak grad: 90.6 mmHg
Radius: 0.5 cm

## 2021-10-19 NOTE — Progress Notes (Signed)
ANTICOAGULATION CONSULT NOTE - follow up  Pharmacy Consult for Heparin (apixaban PTA) Indication: NSTEMI and PE  Allergies  Allergen Reactions   Sulfa Antibiotics Nausea Only    Patient Measurements: Height: 5\' 3"  (160 cm) Weight: 52.6 kg (115 lb 15.4 oz) IBW/kg (Calculated) : 52.4 Heparin Dosing Weight: TBW  Vital Signs: Temp: 98 F (36.7 C) (10/30 0600) Temp Source: Oral (10/30 0600) BP: 105/61 (10/30 0600) Pulse Rate: 91 (10/30 0600)  Labs: Recent Labs    10/17/21 0329 10/18/21 0152 10/19/21 0253  HGB 10.9* 11.3* 11.0*  HCT 33.8* 35.0* 33.9*  PLT 228 195 219  HEPARINUNFRC 0.56 0.39 0.40  CREATININE 1.90* 2.00* 2.10*     Estimated Creatinine Clearance: 15.6 mL/min (A) (by C-G formula based on SCr of 2.1 mg/dL (H)).  Medications:  Scheduled:   aspirin EC  81 mg Oral Daily   atorvastatin  20 mg Oral Daily   Chlorhexidine Gluconate Cloth  6 each Topical Daily   furosemide  80 mg Oral BID   hydrALAZINE  25 mg Oral Q8H   mouth rinse  15 mL Mouth Rinse BID   multivitamin with minerals  1 tablet Oral Daily   sodium chloride flush  3 mL Intravenous Q12H   Infusions:   sodium chloride Stopped (10/10/21 1902)   heparin 850 Units/hr (10/19/21 0409)    Assessment: 65 yoF presented to ED with ShOB.  Heparin per pharmacy for ACS, hx PE/DVT on apixaban.  PTA Eliquis held, last dose 10/20 at 18:00.  Patient will remain on heparin until it is deemed that her pleural effusions are stable and will not require further thoracentesis and then restart Eliquis. CXR on 10/29 showed little significant change in bilateral pleural effusions.   Heparin level therapeutic at 0.40 on 850 unit/hr. CBC is stable. No s/sx of bleeding noted.   Goal of Therapy:  Heparin level 0.3-0.7 units/ml Monitor platelets by anticoagulation protocol: Yes   Plan:  Continue heparin @ 850 units/hr. Daily heparin level and CBC while on heparin F/U plans to resume apixaban  Pauletta Browns,  Pharm.D. PGY-1 Pharmacy Resident [phone] 10/19/2021 8:19 AM

## 2021-10-19 NOTE — Progress Notes (Addendum)
Progress Note  Patient Name: Jillian Cooley Date of Encounter: 10/19/2021  [facility] Cardiologist: Werner Lean, MD   Subjective   Breathing is better today.  Creatinine stable with diuresis.  Put out 500 cc yesterday and is net -2.4 L since admission.  Weight is down 6 pounds yesterday to 116lbs today (she tells me her usual weight at home is around 120lbs) -no weight is recorded today Inpatient Medications    Scheduled Meds:  aspirin EC  81 mg Oral Daily   atorvastatin  20 mg Oral Daily   Chlorhexidine Gluconate Cloth  6 each Topical Daily   furosemide  80 mg Oral BID   hydrALAZINE  25 mg Oral Q8H   mouth rinse  15 mL Mouth Rinse BID   multivitamin with minerals  1 tablet Oral Daily   sodium chloride flush  3 mL Intravenous Q12H   Continuous Infusions:  sodium chloride Stopped (10/10/21 1902)   heparin 850 Units/hr (10/19/21 0409)   PRN Meds: sodium chloride, acetaminophen, docusate sodium, ondansetron (ZOFRAN) IV, polyethylene glycol   Vital Signs    Vitals:   10/18/21 1451 10/18/21 1951 10/18/21 2233 10/19/21 0600  BP: 108/61 (!) 105/56 111/64 105/61  Pulse: 91 97  91  Resp: $Remo'16 12  13  'Thbqo$ Temp: 97.6 F (36.4 C) 97.9 F (36.6 C)  98 F (36.7 C)  TempSrc: Oral Oral  Oral  SpO2:    95%  Weight:      Height:        Intake/Output Summary (Last 24 hours) at 10/19/2021 0831 Last data filed at 10/19/2021 0600 Gross per 24 hour  Intake 277.73 ml  Output 500 ml  Net -222.27 ml    Last 3 Weights 10/18/2021 10/17/2021 10/17/2021  Weight (lbs) 115 lb 15.4 oz 122 lb 2.2 oz 122 lb 2.2 oz  Weight (kg) 52.6 kg 55.4 kg 55.4 kg      Telemetry    Normal sinus rhythm with PVCs- Personally Reviewed  ECG    No new EKG to review- Personally Reviewed  Physical Exam  GEN: Thin and frail appearing HEENT: Normal NECK: No JVD; No carotid bruits LYMPHATICS: No lymphadenopathy CARDIAC:RRR, no murmurs, rubs, gallops RESPIRATORY:  crackles at left  base ABDOMEN: Soft, non-tender, non-distended MUSCULOSKELETAL:  No edema; No deformity  SKIN: Warm and dry NEUROLOGIC:  Alert and oriented x 3 PSYCHIATRIC:  Normal affect   Labs    High Sensitivity Troponin:   Recent Labs  Lab 10/10/21 0731 10/10/21 0935 10/10/21 1548 10/10/21 1822 10/12/21 0252  TROPONINIHS 523* 836* 3,307* 5,378* 5,247*      Chemistry Recent Labs  Lab 10/14/21 0456 10/15/21 0255 10/17/21 0329 10/18/21 0152 10/19/21 0253  NA 141   < > 137 136 133*  K 4.1   < > 4.1 5.0 4.1  CL 108   < > 104 104 101  CO2 23   < > $R'26 25 24  'Nj$ GLUCOSE 126*   < > 101* 121* 111*  BUN 86*   < > 65* 61* 65*  CREATININE 2.12*   < > 1.90* 2.00* 2.10*  CALCIUM 8.8*   < > 8.8* 8.9 8.8*  MG 2.3  --   --  2.1  --   GFRNONAA 22*   < > 25* 24* 22*  ANIONGAP 10   < > $R'7 7 8   'wP$ < > = values in this interval not displayed.     Lipids  No results for input(s): CHOL, TRIG,  HDL, LABVLDL, LDLCALC, CHOLHDL in the last 168 hours.   Hematology Recent Labs  Lab 10/17/21 0329 10/18/21 0152 10/19/21 0253  WBC 9.7 9.8 8.8  RBC 3.69* 3.82* 3.68*  HGB 10.9* 11.3* 11.0*  HCT 33.8* 35.0* 33.9*  MCV 91.6 91.6 92.1  MCH 29.5 29.6 29.9  MCHC 32.2 32.3 32.4  RDW 15.0 15.3 15.3  PLT 228 195 219    Thyroid No results for input(s): TSH, FREET4 in the last 168 hours.  BNP Recent Labs  Lab 10/16/21 0252  BNP 1,121.0*     DDimer No results for input(s): DDIMER in the last 168 hours.   Radiology    DG Chest 1 View  Result Date: 10/17/2021 CLINICAL DATA:  Left thoracentesis. EXAM: CHEST  1 VIEW COMPARISON:  10/15/2021 FINDINGS: Interval decrease in left pleural effusion with only trace left pleural effusion visible on the current study. No left-sided pneumothorax. Similar appearance right base collapse/consolidation with small right pleural effusion. The cardio pericardial silhouette is enlarged. The visualized bony structures of the thorax show no acute abnormality. Telemetry leads  overlie the chest. IMPRESSION: Interval decrease in left pleural effusion without evidence for pneumothorax. Electronically Signed   By: Misty Stanley M.D.   On: 10/17/2021 16:17   DG Chest 2 View  Result Date: 10/18/2021 CLINICAL DATA:  Shortness of breath. EXAM: CHEST - 2 VIEW COMPARISON:  10/17/2021 prior studies FINDINGS: Cardiomediastinal silhouette is unchanged. Bilateral pleural effusions are again noted as well as RIGHT LOWER lung consolidation/atelectasis. Slightly increasing atelectasis versus airspace disease in the LEFT LOWER lung is noted. There is no evidence of pneumothorax. No other interval changes are present. IMPRESSION: 1. Slightly increasing atelectasis versus airspace disease in the LEFT LOWER lung. 2. Little significant change in bilateral pleural effusions with RIGHT LOWER lung consolidation/atelectasis. Electronically Signed   By: Margarette Canada M.D.   On: 10/18/2021 11:42   IR THORACENTESIS ASP PLEURAL SPACE W/IMG GUIDE  Result Date: 10/17/2021 INDICATION: Recurrent pleural effusion EXAM: ULTRASOUND GUIDED LEFT THORACENTESIS MEDICATIONS: None. COMPLICATIONS: None immediate. PROCEDURE: An ultrasound guided thoracentesis was thoroughly discussed with the patient and questions answered. The benefits, risks, alternatives and complications were also discussed. The patient understands and wishes to proceed with the procedure. Written consent was obtained. Ultrasound was performed to localize and mark an adequate pocket of fluid in the left chest. The area was then prepped and draped in the normal sterile fashion. 1% Lidocaine was used for local anesthesia. A 6 Fr Safe-T-Centesis catheter was introduced. Thoracentesis was performed. The catheter was removed and a dressing applied. FINDINGS: A total of approximately 1.6L of red tinged pleural fluid was removed. IMPRESSION: Successful ultrasound guided left thoracentesis yielding 1.6L of pleural fluid. Electronically Signed   By: Miachel Roux  M.D.   On: 10/17/2021 12:19    Cardiac Studies   Echocardiogram 10/10/2021      1. Normal LV function; mild AI; severe MR; elevated mean gradient across  MV of 5 mmHg likely at least partially related to severe MR; moderate TR.   2. Left ventricular ejection fraction, by estimation, is 60 to 65%. The  left ventricle has normal function. The left ventricle has no regional  wall motion abnormalities. Left ventricular diastolic parameters are  indeterminate.   3. Right ventricular systolic function is normal. The right ventricular  size is normal. There is moderately elevated pulmonary artery systolic  pressure.   4. Left atrial size was moderately dilated.   5. Large pleural effusion in the left  lateral region.   6. The mitral valve is abnormal. Severe mitral valve regurgitation. Mild  mitral stenosis.   7. Tricuspid valve regurgitation is moderate.   8. The aortic valve has an indeterminant number of cusps. Aortic valve  regurgitation is mild. No aortic stenosis is present.   9. The inferior vena cava is normal in size with <50% respiratory  variability, suggesting right atrial pressure of 8 mmHg.        Right heart catheterization 10/14/2021   Conclusion   1. Right and left heart filling pressures are elevated.  2. Prominent v-wave suggestive of significant mitral regurgitation.  3. Cardiac output is normal.  4. Mixed pulmonary venous/pulmonary arterial HTN   Right Heart   Right Heart Pressures RHC Procedural Findings: Hemodynamics (mmHg) RA mean 11 RV 66/9 PA 74/29, mean 49 PCWP mean 25, prominent V-waves to 37  Oxygen saturations: PA 62% AO 100%  Cardiac Output (Fick) 4.1  Cardiac Index (Fick) 2.6 PVR 5.8 WU      Patient Profile     87 y.o. female with multiple myeloma, HTN, HLD, valvular heart disease (at least moderate, probably severe MR), CKD stage 4, carpal tunnel, bilateral DVT/PE, pleural effusion s/p repeated thoracentesis admitted 10/21 with  hypoxic respiratory failure and hypotension, lactic acidosis, elevated troponin, treated with IVF and stress dose steroids. She underwent L sided thoracentesis on 10/21 (1.5L) and repeat L sided thoracentesis on 10/23 (1.2L). Cytology -ve for malignancy.  Right heart catheterization performed 10/13/2021 showed mixed findings of acute left heart failure and intrinsic pulmonary artery hypertension (consider secondary to pulm embolism).  Transferred to [facility] for TEE to assess if MitraClip is an option, possibly involve HF team if not.  Assessment & Plan    Acute diastolic heart failure: Secondary at least in large part to (probably) severe mitral insufficiency.  Mean PA wedge at cardiac catheterization was 25 mmHg with prominent V waves.  Despite IV diuretics, continues to have rapidly reaccumulating pleural effusion.  Renal function is stable.  In May her BNP was elevated at 530, on this admission was over 1000, now 1121.   -Have continued to increase hydralazine for afterload reduction for MR but due to low diastolic blood pressure there is been little room to increase dose  -Have avoided RAAS inhibitors due to renal dysfunction.   -Discussed signs and symptoms of heart for exacerbation, the importance of sodium restricted diet, daily weight monitoring.  -TEE showed severe thickening of the mitral valve with at least mild mitral stenosis with mean gradient 5 mmHg at a heart rate of 60 and severe mitral regurgitation.  It was felt that the mechanism of MR was bileaflet restriction with poor commissural coaptation and felt not to be a suitable candidate for MitraClip given her diastolic mitral gradient and bileaflet thickening -We will discuss further with structural heart team -her weight was now down 6lbs to 115lbs yesterday and she normally runs around 120lbs at home -Chest x-ray yesterday showed slightly increased atelectasis versus airspace disease in the left lower lung and little significant change in  bilateral effusions with right lower lung consolidation/atelectasis -She does not appear volume overloaded on exam> she has crackles at left base but suspect this is more related to atelectasis -Lasix 80 mg twice daily p.o. -Serum creatinine is stable at 2.1 and potassium 4.1 today  Mitral insufficiency: Described as moderate on outside echocardiogram performed in February (unable to review images), moderate (but possibly underestimated) by echo in May.   -She is not  a surgical candidate for mitral valve repair/replacement.  -TEE showed severe thickening of the mitral valve leaflets with at least mild mitral stenosis with mean gradient 5 mmHg and severe mitral regurgitation with mechanism of MR being bileaflet restriction with poor commissural coaptation unfortunately likely not a candidate for MitraClip given her diastolic mitral gradient and bileaflet thickening -Continue hydralazine 25 mg 3 times daily -We will discuss further with structural heart team on Monday  PAH: Mixed etiology due to left heart failure (primarily due to mitral regurgitation) and intrinsic pulmonary arterial disease (possibly sequelae of previous pulmonary embolism).  Note that she had evidence of moderate to severe pulmonary hypertension by echocardiogram performed in February. -Continue diuretics with Lasix 80 mg p.o. twice daily  Acute on chronic respiratory failure with hypoxia: Improved with thoracentesis and diuresis, but pleural effusion re-accumulated fast.  -Chest x-ray yesterday showed slightly increased atelectasis versus airspace disease in the left lower lung and little significant change in bilateral effusions with right lower lung consolidation/atelectasis -She has crackles at the left lung base but suspect this is more related to atelectasis -Order incentive spirometry  NSTEMI: She does not have any regional wall motion abnormalities on the echocardiogram, but it is quite possible that she has underlying  CAD based on the degree of increase in cardiac enzymes.   -She is not a candidate for invasive angiography and percutaneous revascularization or surgery.   -It is quite reasonable to treat her with aggressive lipid-lowering therapy.   -Continue aspirin 81 mg daily but this should be stopped when she restarts treatment with Eliquis for her pulmonary embolism  6.  PE: Although this occurred in the setting of postoperative state, it is very likely that she has a hypercoagulable condition due to the hematological disorder.   -Currently on IV heparin until it is deemed that her pleural effusions are stable and will require no further thoracentesis and then restart Eliquis -Probably will remain on lifelong anticoagulation.  7.  Sinus pauses: No significant bradycardia on telemetry overnight and mostly blocked PACs  8. Chronic kidney disease stage IV: Renal parameters back to baseline and serum creatinine is hovering around 1.9-2   9.  multiple myeloma: to discuss plans to resume therapy w he Oncologist.    I have spent a total of 30 minutes with patient reviewing TEE, right heart cath,, telemetry, EKGs, labs and examining patient as well as establishing an assessment and plan that was discussed with the patient.  > 50% of time was spent in direct patient care.     For questions or updates, please contact [place] Please consult [email or web address] for contact info under        Signed, Fransico Him, MD  10/19/2021, 8:31 AM

## 2021-10-19 NOTE — Progress Notes (Signed)
[facility] PROGRESS NOTE   Jillian Cooley YQM:[id number] DOB: February 20, 1932 DOA: 10/10/2021  PCP: [name], MD  Brief History/Interval Summary: 87 y.o. F with dCHF on home O2 PRN, recent Dx MM not yet started chemo, DVT and PE after rotator cuff surgery last May, on apixaban, as well as recurrent transudative pleural effusion (see below) who presented with slowly progressive SOB, finally severe.  Required BiPAP initially.  Was found to have a large left-sided pleural effusion.  Was admitted to the intensive care unit.  Underwent thoracentesis x2 during this hospital stay.  Seen by cardiology as well.  Underwent right heart catheterization.  Plan is for TEE.   Reason for Visit: Pleural effusion  Consultants: Pulmonology.  Cardiology  Procedures: Thoracentesis x2.  Right heart catheterization.   Subjective/Interval History: Patient mentions that she is feeling much better compared to the last few days.  Denies any chest pain shortness of breath.  She was able to ambulate with physical therapy yesterday and was taken off of oxygen.  Daughter is at the bedside.    Assessment/Plan:  Acute on chronic respiratory failure with hypoxia Secondary to CHF along with pleural effusion.  Uses oxygen as needed at home.  Taken off of oxygen yesterday.  Doing better.  Saturations are in the 90s.  Left-sided pleural effusion Initially diagnosed in February.  Seen by pulmonology.  Has undergone multiple thoracentesis including multiple times during this hospital stay.  Fluid analysis is consistent with a transudative process.  Thought to be secondary to cardiac process.   Patient underwent another thoracentesis on 10/28.   Chest x-ray was repeated yesterday and shows atelectasis.  No significant pleural effusion noted.  Patient seems to be doing better from a respiratory standpoint.   Pulmonology to follow patient in their office. Hopefully with higher dose of diuretics her need  for thoracentesis will decrease.  Acute on chronic diastolic CHF/NSTEMI/Mobitz type I AV block Has been on and off of diuretics during this hospital stay mainly driven by creatinine and symptoms.  Placed on oral furosemide yesterday.  Seems to be doing well.  Renal function is stable for the most part.  Monitor urine output.  Strict ins and outs and daily weights. Patient was found to have elevated troponin levels though doubt acute coronary syndrome.   Patient is on aspirin and statin.  Not noted to be on beta-blocker at this time.  Not on ACE inhibitor or ARB due to elevated creatinine.  Severe mitral regurgitation Severe mitral regurgitation noted on TEE.  Further management per cardiology but it appears that patient may not be a candidate for any invasive interventions.  Cardiology to discuss with the structural heart team tomorrow.  Acute kidney injury superimposed on chronic kidney disease stage IV Baseline creatinine is around 1.8-2.1.  It was up to 2.7.  Has improved and now back to baseline.  Monitor urine output.    History of bilateral pulmonary embolism diagnosed in May Was on apixaban.  Currently on IV heparin until all procedures have been completed.  Respiratory status is stable.  Hopefully transition to apixaban at the time of discharge.  Pulmonary artery hypertension Cardiology and pulmonology has been following.  Essential hypertension/hypokalemia Blood pressure on the lower side but stable.  Patient is asymptomatic.  Remains on furosemide and hydralazine.   Potassium is better today.    History of multiple myeloma Followed by [name] with oncology.  Has not been started on treatment yet due to her other comorbidities and  acute issues.  Patient has an appointment with [name] on 10/23/2021 at 2:40 PM.  Normocytic anemia Stable.  No evidence of overt bleeding.  Severe protein calorie malnutrition Nutrition Problem: Severe Malnutrition Etiology: chronic  illness  Signs/Symptoms: severe fat depletion, severe muscle depletion  Interventions: Magic cup, MVI   DVT Prophylaxis: On IV heparin Code Status: Full code Family Communication: Discussed with patient and her daughter who was at bedside. Disposition Plan: Home health recommended by PT  Status is: Inpatient  Remains inpatient appropriate because: Need for procedures that cannot be done in the outpatient setting and IV diuretics     Medications: Scheduled:  aspirin EC  81 mg Oral Daily   atorvastatin  20 mg Oral Daily   Chlorhexidine Gluconate Cloth  6 each Topical Daily   furosemide  80 mg Oral BID   hydrALAZINE  25 mg Oral Q8H   mouth rinse  15 mL Mouth Rinse BID   multivitamin with minerals  1 tablet Oral Daily   sodium chloride flush  3 mL Intravenous Q12H   Continuous:  sodium chloride Stopped (10/10/21 1902)   heparin 850 Units/hr (10/19/21 0409)   TZG:YFVCBS chloride, acetaminophen, docusate sodium, ondansetron (ZOFRAN) IV, polyethylene glycol  Antibiotics: Anti-infectives (From admission, onward)    Start     Dose/Rate Route Frequency Ordered Stop   10/10/21 1900  cefTRIAXone (ROCEPHIN) 2 g in sodium chloride 0.9 % 100 mL IVPB  Status:  Discontinued        2 g 200 mL/hr over 30 Minutes Intravenous Every 24 hours 10/10/21 1457 10/11/21 1039   10/10/21 1900  azithromycin (ZITHROMAX) 500 mg in sodium chloride 0.9 % 250 mL IVPB  Status:  Discontinued        500 mg 250 mL/hr over 60 Minutes Intravenous Every 24 hours 10/10/21 1457 10/11/21 1015   10/10/21 1045  piperacillin-tazobactam (ZOSYN) IVPB 3.375 g        3.375 g 100 mL/hr over 30 Minutes Intravenous  Once 10/10/21 1034 10/10/21 1255   10/10/21 1030  piperacillin-tazobactam (ZOSYN) IVPB 2.25 g  Status:  Discontinued        2.25 g 100 mL/hr over 30 Minutes Intravenous  Once 10/10/21 1017 10/10/21 1034       Objective:  Vital Signs  Vitals:   10/18/21 1951 10/18/21 2233 10/19/21 0600 10/19/21 0847   BP: (!) 105/56 111/64 105/61   Pulse: 97  91   Resp: 12  13   Temp: 97.9 F (36.6 C)  98 F (36.7 C) 97.7 F (36.5 C)  TempSrc: Oral  Oral Oral  SpO2:   95%   Weight:      Height:        Intake/Output Summary (Last 24 hours) at 10/19/2021 1032 Last data filed at 10/19/2021 0700 Gross per 24 hour  Intake 277.73 ml  Output 700 ml  Net -422.27 ml    Filed Weights   10/17/21 0415 10/17/21 1300 10/18/21 0335  Weight: 55.4 kg 55.4 kg 52.6 kg    General appearance: Awake alert.  In no distress Resp: Diminished air entry at the bases bilaterally few crackles.  No wheezing. Cardio: S1-S2 is normal regular.  No S3-S4.  No rubs murmurs or bruit GI: Abdomen is soft.  Nontender nondistended.  Bowel sounds are present normal.  No masses organomegaly Extremities: Minimal edema. Neurologic: Alert and oriented x3.  No focal neurological deficits.      Lab Results:  Data Reviewed: I have personally reviewed following labs  and imaging studies  CBC: Recent Labs  Lab 10/15/21 0255 10/16/21 0252 10/17/21 0329 10/18/21 0152 10/19/21 0253  WBC 7.3 7.6 9.7 9.8 8.8  HGB 9.7* 10.3* 10.9* 11.3* 11.0*  HCT 31.2* 32.8* 33.8* 35.0* 33.9*  MCV 94.8 94.5 91.6 91.6 92.1  PLT 182 193 228 195 219     Basic Metabolic Panel: Recent Labs  Lab 10/14/21 0456 10/15/21 0255 10/16/21 0252 10/17/21 0329 10/18/21 0152 10/19/21 0253  NA 141 135 138 137 136 133*  K 4.1 3.0* 4.5 4.1 5.0 4.1  CL 108 103 106 104 104 101  CO2 $Re'23 23 25 26 25 24  'jlt$ GLUCOSE 126* 111* 118* 101* 121* 111*  BUN 86* 76* 76* 65* 61* 65*  CREATININE 2.12* 2.11* 2.01* 1.90* 2.00* 2.10*  CALCIUM 8.8* 8.1* 8.6* 8.8* 8.9 8.8*  MG 2.3  --   --   --  2.1  --      GFR: Estimated Creatinine Clearance: 15.6 mL/min (A) (by C-G formula based on SCr of 2.1 mg/dL (H)).    Recent Results (from the past 240 hour(s))  Resp Panel by RT-PCR (Flu A&B, Covid) Nasopharyngeal Swab     Status: None   Collection Time: 10/10/21  8:57  AM   Specimen: Nasopharyngeal Swab; Nasopharyngeal(NP) swabs in vial transport medium  Result Value Ref Range Status   SARS Coronavirus 2 by RT PCR NEGATIVE NEGATIVE Final    Comment: (NOTE) SARS-CoV-2 target nucleic acids are NOT DETECTED.  The SARS-CoV-2 RNA is generally detectable in upper respiratory specimens during the acute phase of infection. The lowest concentration of SARS-CoV-2 viral copies this assay can detect is 138 copies/mL. A negative result does not preclude SARS-Cov-2 infection and should not be used as the sole basis for treatment or other patient management decisions. A negative result may occur with  improper specimen collection/handling, submission of specimen other than nasopharyngeal swab, presence of viral mutation(s) within the areas targeted by this assay, and inadequate number of viral copies(<138 copies/mL). A negative result must be combined with clinical observations, patient history, and epidemiological information. The expected result is Negative.  Fact Sheet for Patients:  EntrepreneurPulse.com.au  Fact Sheet for Healthcare Providers:  IncredibleEmployment.be  This test is no t yet approved or cleared by the [place] FDA and  has been authorized for detection and/or diagnosis of SARS-CoV-2 by FDA under an Emergency Use Authorization (EUA). This EUA will remain  in effect (meaning this test can be used) for the duration of the COVID-19 declaration under Section 564(b)(1) of the Act, 21 U.S.C.section 360bbb-3(b)(1), unless the authorization is terminated  or revoked sooner.       Influenza A by PCR NEGATIVE NEGATIVE Final   Influenza B by PCR NEGATIVE NEGATIVE Final    Comment: (NOTE) The Xpert Xpress SARS-CoV-2/FLU/RSV plus assay is intended as an aid in the diagnosis of influenza from Nasopharyngeal swab specimens and should not be used as a sole basis for treatment. Nasal washings and aspirates are  unacceptable for Xpert Xpress SARS-CoV-2/FLU/RSV testing.  Fact Sheet for Patients: EntrepreneurPulse.com.au  Fact Sheet for Healthcare Providers: IncredibleEmployment.be  This test is not yet approved or cleared by the [place] FDA and has been authorized for detection and/or diagnosis of SARS-CoV-2 by FDA under an Emergency Use Authorization (EUA). This EUA will remain in effect (meaning this test can be used) for the duration of the COVID-19 declaration under Section 564(b)(1) of the Act, 21 U.S.C. section 360bbb-3(b)(1), unless the authorization is terminated or revoked.  Performed at [facility], [place] [address], [place], [place] [address]   Culture, blood (routine x 2)     Status: None   Collection Time: 10/10/21 10:08 AM   Specimen: BLOOD  Result Value Ref Range Status   Specimen Description   Final    BLOOD RIGHT ANTECUBITAL Performed at [place] [address], [place], [place] [address]    Special Requests   Final    BOTTLES DRAWN AEROBIC AND ANAEROBIC Blood Culture results may not be optimal due to an inadequate volume of blood received in culture bottles Performed at [place] [address] [place], [place] [address]    Culture   Final    NO GROWTH 5 DAYS Performed at [facility], [place] [address] [place], [place] [address]    Report Status 10/15/2021 FINAL  Final  Body fluid culture w Gram Stain     Status: None   Collection Time: 10/10/21  2:21 PM   Specimen: PATH Cytology Pleural fluid  Result Value Ref Range Status   Specimen Description   Final    PLEURAL Performed at [place] [address], [place], [place] [address]    Special Requests   Final    NONE Performed at [facility], [place] [address] [place], [place] [address]    Gram Stain   Final    NO SQUAMOUS EPITHELIAL CELLS SEEN FEW WBC  SEEN NO ORGANISMS SEEN    Culture   Final    NO GROWTH 3 DAYS Performed at [facility], [place] [address], [place], [place] [address]    Report Status 10/14/2021 FINAL  Final  MRSA Next Gen by PCR, Nasal     Status: None   Collection Time: 10/10/21  5:34 PM   Specimen: Nasal Mucosa; Nasal Swab  Result Value Ref Range Status   MRSA by PCR Next Gen NOT DETECTED NOT DETECTED Final    Comment: (NOTE) The GeneXpert MRSA Assay (FDA approved for NASAL specimens only), is one component of a comprehensive MRSA colonization surveillance program. It is not intended to diagnose MRSA infection nor to guide or monitor treatment for MRSA infections. Test performance is not FDA approved in patients less than 31 years old. Performed at [facility], [place] [address], [place], [place] [address]   Culture, blood (routine x 2)     Status: None   Collection Time: 10/10/21  6:22 PM   Specimen: BLOOD LEFT FOREARM  Result Value Ref Range Status   Specimen Description   Final    BLOOD LEFT FOREARM Performed at [place] [address], [place], [place] [address]    Special Requests   Final    BOTTLES DRAWN AEROBIC ONLY Blood Culture adequate volume Performed at [place] [address], [place], [place] [address]    Culture   Final    NO GROWTH 5 DAYS Performed at [facility], [place] [address], [place],  [address]    Report Status 10/15/2021 FINAL  Final  Respiratory (~20 pathogens) panel by PCR     Status: None   Collection Time: 10/11/21 10:29 AM   Specimen: Nasopharyngeal Swab; Respiratory  Result Value Ref Range Status   Adenovirus NOT DETECTED NOT DETECTED Final   Coronavirus 229E NOT DETECTED NOT DETECTED Final    Comment: (NOTE) The Coronavirus on the Respiratory Panel, DOES NOT test for the  novel  Coronavirus (2019 nCoV)    Coronavirus HKU1 NOT DETECTED NOT DETECTED Final   Coronavirus NL63 NOT DETECTED  NOT DETECTED Final   Coronavirus OC43 NOT DETECTED NOT DETECTED Final   Metapneumovirus NOT DETECTED NOT DETECTED Final   Rhinovirus / Enterovirus NOT DETECTED NOT DETECTED Final   Influenza A NOT DETECTED NOT DETECTED Final   Influenza B NOT DETECTED NOT DETECTED Final   Parainfluenza Virus 1 NOT DETECTED NOT DETECTED Final   Parainfluenza Virus 2 NOT DETECTED NOT DETECTED Final   Parainfluenza Virus 3 NOT DETECTED NOT DETECTED Final   Parainfluenza Virus 4 NOT DETECTED NOT DETECTED Final   Respiratory Syncytial Virus NOT DETECTED NOT DETECTED Final   Bordetella pertussis NOT DETECTED NOT DETECTED Final   Bordetella Parapertussis NOT DETECTED NOT DETECTED Final   Chlamydophila pneumoniae NOT DETECTED NOT DETECTED Final   Mycoplasma pneumoniae NOT DETECTED NOT DETECTED Final    Comment: Performed at [facility], [place] [address], [place], [place] [address]       Radiology Studies: DG Chest 1 View  Result Date: 10/17/2021 CLINICAL DATA:  Left thoracentesis. EXAM: CHEST  1 VIEW COMPARISON:  10/15/2021 FINDINGS: Interval decrease in left pleural effusion with only trace left pleural effusion visible on the current study. No left-sided pneumothorax. Similar appearance right base collapse/consolidation with small right pleural effusion. The cardio pericardial silhouette is enlarged. The visualized bony structures of the thorax show no acute abnormality. Telemetry leads overlie the chest. IMPRESSION: Interval decrease in left pleural effusion without evidence for pneumothorax. Electronically Signed   By: Misty Stanley M.D.   On: 10/17/2021 16:17   DG Chest 2 View  Result Date: 10/18/2021 CLINICAL DATA:  Shortness of breath. EXAM: CHEST - 2 VIEW COMPARISON:  10/17/2021 prior studies FINDINGS: Cardiomediastinal silhouette is unchanged. Bilateral pleural effusions are again noted as well as RIGHT LOWER lung consolidation/atelectasis. Slightly increasing atelectasis versus airspace disease in  the LEFT LOWER lung is noted. There is no evidence of pneumothorax. No other interval changes are present. IMPRESSION: 1. Slightly increasing atelectasis versus airspace disease in the LEFT LOWER lung. 2. Little significant change in bilateral pleural effusions with RIGHT LOWER lung consolidation/atelectasis. Electronically Signed   By: Margarette Canada M.D.   On: 10/18/2021 11:42   ECHO TEE  Result Date: 10/19/2021    TRANSESOPHOGEAL ECHO REPORT   Patient Name:   Jillian Cooley Date of Exam: 10/17/2021 Medical Rec #:  [id number]             Height:       63.0 in Accession #:    [id number]            Weight:       122.1 lb Date of Birth:  05/19/1932             BSA:          1.568 m Patient Age:    60 years              BP:           116/63 mmHg Patient Gender: F                     HR:           93 bpm. Exam Location:  Inpatient Procedure: 2D Echo, 3D Echo, Color Doppler and Cardiac Doppler Indications:     Mitral Regurgitation  History:         Patient has  prior history of Echocardiogram examinations, most                  recent 10/10/2021. CHF; Risk Factors:Hypertension.  Sonographer:     Raquel Sarna Senior RDCS Referring Phys:  [id number] Tami Lin [facility] Diagnosing Phys: Cherlynn Kaiser MD PROCEDURE: After discussion of the risks and benefits of a TEE, an informed consent was obtained from the patient. The transesophogeal probe was passed without difficulty through the esophogus of the patient. Local oropharyngeal anesthetic was provided with Cetacaine. Sedation performed by different physician. The patient was monitored while under deep sedation. Anesthestetic sedation was provided intravenously by Anesthesiology: 173mg  of Propofol. The patient developed no complications during the procedure. IMPRESSIONS  1. The mitral valve is abnormally thickened, with restricted motion. Severe mitral valve regurgitation with at least 2 jets, mechanism of MR is likely from restricted leaflet motion and commissural  malcoaptation. Systolic reversals in the pulmonary veins. The mean mitral valve gradient is 4.5 mmHg with average heart rate of 74 bpm, suggesting at least mild mitral stenosis. Moderate mitral annular calcification. Severe leaflet thickening. Wilkins score 10-12.  2. Left ventricular ejection fraction, by estimation, is 60 to 65%. The left ventricle has normal function.  3. Right ventricular systolic function is mildly reduced. The right ventricular size is normal.  4. Left atrial size was moderately dilated. No left atrial/left atrial appendage thrombus was detected. The LAA emptying velocity was 54 cm/s.  5. Right atrial size was mildly dilated.  6. The tricuspid valve is abnormally thickened. Mild TR.  7. The aortic valve is abnormal. There is severe thickening of the aortic valve. Aortic valve regurgitation is mild. Mild aortic valve sclerosis is present, with no evidence of aortic valve stenosis.  8. There is Moderate (Grade III) atheroma plaque involving the transverse and descending aorta. Conclusion(s)/Recommendation(s): The mitral valve regurgitation on this exam appears at least moderate-severe by color flow however this is in the setting of significant hypotension, mean BP in 80/40s for majority of exam. There are still systolic reversals in pulmonary veins and at least two jets of MR. All cardiac valves (AV, TV, MV, PV) appear abnormal thickened, suggesting possible post inflammatory valve changes. Based on the appearance of the mitral valve, unlikely to be suitable for TEER, consider structural heart team discussion. FINDINGS  Left Ventricle: Left ventricular ejection fraction, by estimation, is 60 to 65%. The left ventricle has normal function. The left ventricular internal cavity size was normal in size. Right Ventricle: The right ventricular size is normal. Right vetricular wall thickness was not well visualized. Right ventricular systolic function is mildly reduced. Left Atrium: Left atrial size  was moderately dilated. No left atrial/left atrial appendage thrombus was detected. The LAA emptying velocity was 54 cm/s. Right Atrium: Right atrial size was mildly dilated. Pericardium: There is no evidence of pericardial effusion. Mitral Valve: The mitral valve is abnormal. There is severe thickening of the mitral valve leaflet(s). Severely decreased mobility of the mitral valve leaflets. Moderate mitral annular calcification. Severe mitral valve regurgitation. Mild mitral valve stenosis. MV peak gradient, 7.6 mmHg. The mean mitral valve gradient is 4.5 mmHg with average heart rate of 74 bpm. Tricuspid Valve: The tricuspid valve is abnormal. Tricuspid valve regurgitation is mild. Aortic Valve: The aortic valve is abnormal. There is severe thickening of the aortic valve. Aortic valve regurgitation is mild. Mild aortic valve sclerosis is present, with no evidence of aortic valve stenosis. Pulmonic Valve: The pulmonic valve was thickened with mildly decreased excursion. Pulmonic  valve regurgitation is trivial. Aorta: The aortic root and ascending aorta are structurally normal, with no evidence of dilitation. There is moderate (Grade III) atheroma plaque involving the transverse and descending aorta. IAS/Shunts: No atrial level shunt detected by color flow Doppler.   AORTA Ao Root diam: 3.00 cm Ao Asc diam:  3.40 cm MITRAL VALVE MV Peak grad: 7.6 mmHg MV Mean grad: 4.5 mmHg MV Vmax:      1.38 m/s MV Vmean:     97.0 cm/s MR Peak grad:    90.6 mmHg MR Mean grad:    67.0 mmHg MR Vmax:         476.00 cm/s MR Vmean:        394.0 cm/s MR PISA:         1.57 cm MR PISA Eff ROA: 17 mm MR PISA Radius:  0.50 cm Cherlynn Kaiser MD Electronically signed by Cherlynn Kaiser MD Signature Date/Time: 10/19/2021/8:55:34 AM    Final    IR THORACENTESIS ASP PLEURAL SPACE W/IMG GUIDE  Result Date: 10/17/2021 INDICATION: Recurrent pleural effusion EXAM: ULTRASOUND GUIDED LEFT THORACENTESIS MEDICATIONS: None. COMPLICATIONS: None  immediate. PROCEDURE: An ultrasound guided thoracentesis was thoroughly discussed with the patient and questions answered. The benefits, risks, alternatives and complications were also discussed. The patient understands and wishes to proceed with the procedure. Written consent was obtained. Ultrasound was performed to localize and mark an adequate pocket of fluid in the left chest. The area was then prepped and draped in the normal sterile fashion. 1% Lidocaine was used for local anesthesia. A 6 Fr Safe-T-Centesis catheter was introduced. Thoracentesis was performed. The catheter was removed and a dressing applied. FINDINGS: A total of approximately 1.6L of red tinged pleural fluid was removed. IMPRESSION: Successful ultrasound guided left thoracentesis yielding 1.6L of pleural fluid. Electronically Signed   By: Miachel Roux M.D.   On: 10/17/2021 12:19       LOS: 9 days   [place] Sweetin [employer] on [email or web address]  10/19/2021, 10:32 AM

## 2021-10-20 ENCOUNTER — Encounter (HOSPITAL_COMMUNITY): Payer: Self-pay | Admitting: Internal Medicine

## 2021-10-20 ENCOUNTER — Inpatient Hospital Stay (HOSPITAL_COMMUNITY): Payer: Medicare PPO

## 2021-10-20 DIAGNOSIS — I3139 Other pericardial effusion (noninflammatory): Secondary | ICD-10-CM | POA: Diagnosis not present

## 2021-10-20 DIAGNOSIS — J9621 Acute and chronic respiratory failure with hypoxia: Secondary | ICD-10-CM | POA: Diagnosis not present

## 2021-10-20 DIAGNOSIS — I5033 Acute on chronic diastolic (congestive) heart failure: Secondary | ICD-10-CM | POA: Diagnosis not present

## 2021-10-20 DIAGNOSIS — D649 Anemia, unspecified: Secondary | ICD-10-CM | POA: Diagnosis not present

## 2021-10-20 DIAGNOSIS — J90 Pleural effusion, not elsewhere classified: Secondary | ICD-10-CM

## 2021-10-20 DIAGNOSIS — I340 Nonrheumatic mitral (valve) insufficiency: Secondary | ICD-10-CM

## 2021-10-20 DIAGNOSIS — I361 Nonrheumatic tricuspid (valve) insufficiency: Secondary | ICD-10-CM

## 2021-10-20 DIAGNOSIS — I517 Cardiomegaly: Secondary | ICD-10-CM

## 2021-10-20 LAB — BASIC METABOLIC PANEL
Anion gap: 10 (ref 5–15)
BUN: 68 mg/dL — ABNORMAL HIGH (ref 8–23)
CO2: 25 mmol/L (ref 22–32)
Calcium: 9.1 mg/dL (ref 8.9–10.3)
Chloride: 97 mmol/L — ABNORMAL LOW (ref 98–111)
Creatinine, Ser: 2.28 mg/dL — ABNORMAL HIGH (ref 0.44–1.00)
GFR, Estimated: 20 mL/min — ABNORMAL LOW (ref >60)
Glucose, Bld: 107 mg/dL — ABNORMAL HIGH (ref 70–99)
Potassium: 4.2 mmol/L (ref 3.5–5.1)
Sodium: 132 mmol/L — ABNORMAL LOW (ref 135–145)

## 2021-10-20 LAB — HEPARIN LEVEL (UNFRACTIONATED): Heparin Unfractionated: 0.45 IU/mL (ref 0.30–0.70)

## 2021-10-20 LAB — CBC
HCT: 34.3 % — ABNORMAL LOW (ref 36.0–46.0)
Hemoglobin: 11.1 g/dL — ABNORMAL LOW (ref 12.0–15.0)
MCH: 29.6 pg (ref 26.0–34.0)
MCHC: 32.4 g/dL (ref 30.0–36.0)
MCV: 91.5 fL (ref 80.0–100.0)
Platelets: 215 10*3/uL (ref 150–400)
RBC: 3.75 MIL/uL — ABNORMAL LOW (ref 3.87–5.11)
RDW: 15.3 % (ref 11.5–15.5)
WBC: 9.7 10*3/uL (ref 4.0–10.5)
nRBC: 0 % (ref 0.0–0.2)

## 2021-10-20 MED ORDER — FUROSEMIDE 40 MG PO TABS
80.0000 mg | ORAL_TABLET | Freq: Every day | ORAL | Status: DC
  Administered 2021-10-20 – 2021-10-25 (×6): 80 mg via ORAL
  Filled 2021-10-20: qty 2
  Filled 2021-10-20: qty 4
  Filled 2021-10-20 (×3): qty 2

## 2021-10-20 NOTE — Progress Notes (Signed)
Mobility Specialist Progress Note    10/20/21 1302  Mobility  Activity Ambulated in hall  Level of Assistance Standby assist, set-up cues, supervision of patient - no hands on  Assistive Device Front wheel walker  Distance Ambulated (ft) 65 ft  Mobility Ambulated with assistance in hallway  Mobility Response Tolerated well  Mobility performed by Mobility specialist  Bed Position Chair  $Mobility charge 1 Mobility   During Mobility: 121 HR, 98% SpO2  Pt received in chair and agreeable. C/o being lightheaded upon standing. Took a short seated break then went into the hall. Returned to chair with call bell in reach and daughter present.   Hildred Alamin Mobility Specialist  Mobility Specialist Phone: [phone]

## 2021-10-20 NOTE — Progress Notes (Signed)
ANTICOAGULATION CONSULT NOTE - follow up  Pharmacy Consult for Heparin (apixaban PTA) Indication: NSTEMI and PE  Allergies  Allergen Reactions   Sulfa Antibiotics Nausea Only    Patient Measurements: Height: 5\' 3"  (160 cm) Weight: 50.6 kg (111 lb 8 oz) IBW/kg (Calculated) : 52.4 Heparin Dosing Weight: TBW  Vital Signs: Temp: 98.4 F (36.9 C) (10/31 0811) Temp Source: Oral (10/31 0811) BP: 104/57 (10/31 0811) Pulse Rate: 97 (10/31 0811)  Labs: Recent Labs    10/18/21 0152 10/19/21 0253 10/20/21 0156  HGB 11.3* 11.0* 11.1*  HCT 35.0* 33.9* 34.3*  PLT 195 219 215  HEPARINUNFRC 0.39 0.40 0.45  CREATININE 2.00* 2.10* 2.28*     Estimated Creatinine Clearance: 13.9 mL/min (A) (by C-G formula based on SCr of 2.28 mg/dL (H)).  Medications:  Scheduled:   aspirin EC  81 mg Oral Daily   atorvastatin  20 mg Oral Daily   Chlorhexidine Gluconate Cloth  6 each Topical Daily   furosemide  80 mg Oral BID   hydrALAZINE  25 mg Oral Q8H   mouth rinse  15 mL Mouth Rinse BID   multivitamin with minerals  1 tablet Oral Daily   sodium chloride flush  3 mL Intravenous Q12H   Infusions:   sodium chloride Stopped (10/10/21 1902)   heparin 850 Units/hr (10/20/21 0406)    Assessment: 33 yoF presented to ED with ShOB.  Heparin per pharmacy for ACS, hx PE/DVT on apixaban.  PTA Eliquis held, last dose 10/20 at 18:00.  Patient will remain on heparin until it is deemed that her pleural effusions are stable and will not require further thoracentesis and then restart Eliquis.  Heparin level remains therapeutic, CBC stable.  Goal of Therapy:  Heparin level 0.3-0.7 units/ml Monitor platelets by anticoagulation protocol: Yes   Plan:  Continue heparin 850 units/h Daily heparin level and CBC  [name], PharmD, [place], [facility] Clinical Pharmacist [phone] Please check AMION for all [facility] numbers 10/20/2021

## 2021-10-20 NOTE — Care Management Important Message (Signed)
Important Message  Patient Details  Name: Jillian Cooley MRN: [id number] Date of Birth: [birth year]   Medicare Important Message Given:  Yes     Shelda Altes 10/20/2021, 1:22 PM

## 2021-10-20 NOTE — Progress Notes (Signed)
[facility] PROGRESS NOTE   Jillian Cooley ZHG:[id number] DOB: [birth year] DOA: 10/10/2021  PCP: [name], MD  Brief History/Interval Summary: 87 y.o. F with dCHF on home O2 PRN, recent Dx MM not yet started chemo, DVT and PE after rotator cuff surgery last May, on apixaban, as well as recurrent transudative pleural effusion (see below) who presented with slowly progressive SOB, finally severe.  Required BiPAP initially.  Was found to have a large left-sided pleural effusion.  Was admitted to the intensive care unit.  Underwent thoracentesis x2 during this hospital stay.  Seen by cardiology as well.  Underwent right heart catheterization.  Plan is for TEE.   Reason for Visit: Pleural effusion  Consultants: Pulmonology.  Cardiology  Procedures:  Thoracentesis x2.   Right heart catheterization.   Subjective/Interval History: Patient denies any complaints this morning.  No shortness of breath.  Making urine.  Having regular bowel movements.  No nausea or vomiting.  Daughter is at the bedside.    Assessment/Plan:  Acute on chronic respiratory failure with hypoxia Secondary to CHF along with pleural effusion.  Uses oxygen as needed at home.  Currently on room air and doing well.  Saturations are in the 90s.    Left-sided pleural effusion Initially diagnosed in February.  Seen by pulmonology.  Has undergone multiple thoracentesis including during this hospital stay.  Patient underwent thoracentesis on 10/28.  Fluid analysis is consistent with a transudative process.  Thought to be secondary to cardiac process.   Chest x-ray was repeated on 10/29 and showed atelectasis.  No significant pleural effusion noted.  Patient seems to be doing better from a respiratory standpoint.   Pulmonology to follow patient in their office. Hopefully with higher dose of diuretics her need for thoracentesis will decrease.  Acute on chronic diastolic CHF/NSTEMI/Mobitz type I AV  block Has been on and off of diuretics during this hospital stay mainly driven by creatinine and symptoms.   Patient was transitioned to oral furosemide 80 mg twice a day.  Creatinine noted to be slightly higher today.  It appears that cardiology has decreased the dose of her furosemide to once a day.  Recheck labs tomorrow.   Continue to monitor urine output.  Strict ins and outs and daily weight.   Patient was found to have elevated troponin levels though doubt acute coronary syndrome.   Patient is on aspirin and statin.  Not noted to be on beta-blocker at this time.  Not on ACE inhibitor or ARB due to elevated creatinine.  Severe mitral regurgitation Severe mitral regurgitation noted on TEE.  Further management per cardiology but it appears that patient may not be a candidate for any invasive interventions.   Cardiology is planning cardiac MRI to further delineate the severity of mitral regurgitation.    Acute kidney injury superimposed on chronic kidney disease stage IV/hyponatremia Baseline creatinine is around 1.8-2.1.  It was up to 2.7.  Had improved to 1.9.  Has been creeping up over the last 48 hours.  Noted to be 2.28 today.  Sodium level is also lower at 132.  Monitor urine output.    History of bilateral pulmonary embolism diagnosed in May Was on apixaban.  Currently on IV heparin until all procedures have been completed.  Respiratory status is stable.  Hopefully transition to apixaban at the time of discharge.  Should not need aspirin at discharge.  Pulmonary artery hypertension Cardiology and pulmonology has been following.  Essential hypertension/hypokalemia Pressures have been on the  lower side but stable.  Patient is asymptomatic.  Remains on furosemide and hydralazine.  Potassium is stable   History of multiple myeloma Followed by [name] with oncology.  Has not been started on treatment yet due to her other comorbidities and acute issues.  Patient has an appointment with  [name] on 10/23/2021 at 2:40 PM.  Normocytic anemia Stable.  No evidence of overt bleeding.  Severe protein calorie malnutrition Nutrition Problem: Severe Malnutrition Etiology: chronic illness  Signs/Symptoms: severe fat depletion, severe muscle depletion  Interventions: Magic cup, MVI   DVT Prophylaxis: On IV heparin Code Status: Full code Family Communication: Discussed with patient and her daughter who was at bedside. Disposition Plan: Home health recommended by PT  Status is: Inpatient  Remains inpatient appropriate because: Need for procedures that cannot be done in the outpatient setting and IV diuretics     Medications: Scheduled:  aspirin EC  81 mg Oral Daily   atorvastatin  20 mg Oral Daily   Chlorhexidine Gluconate Cloth  6 each Topical Daily   furosemide  80 mg Oral Daily   hydrALAZINE  25 mg Oral Q8H   mouth rinse  15 mL Mouth Rinse BID   multivitamin with minerals  1 tablet Oral Daily   sodium chloride flush  3 mL Intravenous Q12H   Continuous:  sodium chloride Stopped (10/10/21 1902)   heparin 850 Units/hr (10/20/21 0915)   ERX:VQMGQQ chloride, acetaminophen, docusate sodium, ondansetron (ZOFRAN) IV, polyethylene glycol  Antibiotics: Anti-infectives (From admission, onward)    Start     Dose/Rate Route Frequency Ordered Stop   10/10/21 1900  cefTRIAXone (ROCEPHIN) 2 g in sodium chloride 0.9 % 100 mL IVPB  Status:  Discontinued        2 g 200 mL/hr over 30 Minutes Intravenous Every 24 hours 10/10/21 1457 10/11/21 1039   10/10/21 1900  azithromycin (ZITHROMAX) 500 mg in sodium chloride 0.9 % 250 mL IVPB  Status:  Discontinued        500 mg 250 mL/hr over 60 Minutes Intravenous Every 24 hours 10/10/21 1457 10/11/21 1015   10/10/21 1045  piperacillin-tazobactam (ZOSYN) IVPB 3.375 g        3.375 g 100 mL/hr over 30 Minutes Intravenous  Once 10/10/21 1034 10/10/21 1255   10/10/21 1030  piperacillin-tazobactam (ZOSYN) IVPB 2.25 g  Status:   Discontinued        2.25 g 100 mL/hr over 30 Minutes Intravenous  Once 10/10/21 1017 10/10/21 1034       Objective:  Vital Signs  Vitals:   10/19/21 1359 10/19/21 2212 10/20/21 0411 10/20/21 0811  BP: 112/64 (!) 110/58 (!) 102/54 (!) 104/57  Pulse:  99 99 97  Resp:  $Remo'16 14 13  'eSIBS$ Temp:  97.9 F (36.6 C) 97.9 F (36.6 C) 98.4 F (36.9 C)  TempSrc:  Oral Oral Oral  SpO2:  96% 96% 97%  Weight:   50.6 kg   Height:        Intake/Output Summary (Last 24 hours) at 10/20/2021 0925 Last data filed at 10/20/2021 0425 Gross per 24 hour  Intake 76.49 ml  Output 401 ml  Net -324.51 ml    Filed Weights   10/17/21 1300 10/18/21 0335 10/20/21 0411  Weight: 55.4 kg 52.6 kg 50.6 kg    General appearance: Awake alert.  In no distress Resp: Normal effort at rest.  Diminished air entry at the bases with few crackles.  No wheezing or rhonchi. Cardio: S1-S2 is normal regular.  No S3-S4.  No rubs murmurs or bruit GI: Abdomen is soft.  Nontender nondistended.  Bowel sounds are present normal.  No masses organomegaly Extremities: Minimal edema.  Full range of motion of lower extremities. Neurologic: Alert and oriented x3.  No focal neurological deficits.        Lab Results:  Data Reviewed: I have personally reviewed following labs and imaging studies  CBC: Recent Labs  Lab 10/16/21 0252 10/17/21 0329 10/18/21 0152 10/19/21 0253 10/20/21 0156  WBC 7.6 9.7 9.8 8.8 9.7  HGB 10.3* 10.9* 11.3* 11.0* 11.1*  HCT 32.8* 33.8* 35.0* 33.9* 34.3*  MCV 94.5 91.6 91.6 92.1 91.5  PLT 193 228 195 219 215     Basic Metabolic Panel: Recent Labs  Lab 10/14/21 0456 10/15/21 0255 10/16/21 0252 10/17/21 0329 10/18/21 0152 10/19/21 0253 10/20/21 0156  NA 141   < > 138 137 136 133* 132*  K 4.1   < > 4.5 4.1 5.0 4.1 4.2  CL 108   < > 106 104 104 101 97*  CO2 23   < > $R'25 26 25 24 25  'WQ$ GLUCOSE 126*   < > 118* 101* 121* 111* 107*  BUN 86*   < > 76* 65* 61* 65* 68*  CREATININE 2.12*   < >  2.01* 1.90* 2.00* 2.10* 2.28*  CALCIUM 8.8*   < > 8.6* 8.8* 8.9 8.8* 9.1  MG 2.3  --   --   --  2.1  --   --    < > = values in this interval not displayed.     GFR: Estimated Creatinine Clearance: 13.9 mL/min (A) (by C-G formula based on SCr of 2.28 mg/dL (H)).    Recent Results (from the past 240 hour(s))  Culture, blood (routine x 2)     Status: None   Collection Time: 10/10/21 10:08 AM   Specimen: BLOOD  Result Value Ref Range Status   Specimen Description   Final    BLOOD RIGHT ANTECUBITAL Performed at [facility] [address] [place], [place] [address]    Special Requests   Final    BOTTLES DRAWN AEROBIC AND ANAEROBIC Blood Culture results may not be optimal due to an inadequate volume of blood received in culture bottles Performed at [facility] [address] [place], [place] [address]    Culture   Final    NO GROWTH 5 DAYS Performed at [facility], [place] [address], [place], [place] [address]    Report Status 10/15/2021 FINAL  Final  Body fluid culture w Gram Stain     Status: None   Collection Time: 10/10/21  2:21 PM   Specimen: PATH Cytology Pleural fluid  Result Value Ref Range Status   Specimen Description   Final    PLEURAL Performed at [facility] [address], [place], [place] [address]    Special Requests   Final    NONE Performed at [facility], [place] [address], [place], [place] [address]    Gram Stain   Final    NO SQUAMOUS EPITHELIAL CELLS SEEN FEW WBC SEEN NO ORGANISMS SEEN    Culture   Final    NO GROWTH 3 DAYS Performed at [facility], [place] [address], [place], [place] [address]    Report Status 10/14/2021 FINAL  Final  MRSA Next Gen by PCR, Nasal     Status: None   Collection Time: 10/10/21  5:34  PM   Specimen: Nasal Mucosa; Nasal Swab  Result Value Ref Range Status   MRSA by PCR Next Gen NOT DETECTED NOT DETECTED Final    Comment:  (NOTE) The GeneXpert MRSA Assay (FDA approved for NASAL specimens only), is one component of a comprehensive MRSA colonization surveillance program. It is not intended to diagnose MRSA infection nor to guide or monitor treatment for MRSA infections. Test performance is not FDA approved in patients less than 40 years old. Performed at [facility], [place] [address] [place], [place] [address]   Culture, blood (routine x 2)     Status: None   Collection Time: 10/10/21  6:22 PM   Specimen: BLOOD LEFT FOREARM  Result Value Ref Range Status   Specimen Description   Final    BLOOD LEFT FOREARM Performed at [facility] [address] [place], [place] [address]    Special Requests   Final    BOTTLES DRAWN AEROBIC ONLY Blood Culture adequate volume Performed at [place] [address] [place], [place] [address]    Culture   Final    NO GROWTH 5 DAYS Performed at [facility], [place] [address], [place], [place] [address]    Report Status 10/15/2021 FINAL  Final  Respiratory (~20 pathogens) panel by PCR     Status: None   Collection Time: 10/11/21 10:29 AM   Specimen: Nasopharyngeal Swab; Respiratory  Result Value Ref Range Status   Adenovirus NOT DETECTED NOT DETECTED Final   Coronavirus 229E NOT DETECTED NOT DETECTED Final    Comment: (NOTE) The Coronavirus on the Respiratory Panel, DOES NOT test for the novel  Coronavirus (2019 nCoV)    Coronavirus HKU1 NOT DETECTED NOT DETECTED Final   Coronavirus NL63 NOT DETECTED NOT DETECTED Final   Coronavirus OC43 NOT DETECTED NOT DETECTED Final   Metapneumovirus NOT DETECTED NOT DETECTED Final   Rhinovirus / Enterovirus NOT DETECTED NOT DETECTED Final   Influenza A NOT DETECTED NOT DETECTED Final   Influenza B NOT DETECTED NOT DETECTED Final   Parainfluenza Virus 1 NOT DETECTED NOT DETECTED Final   Parainfluenza Virus 2 NOT DETECTED NOT DETECTED Final   Parainfluenza Virus  3 NOT DETECTED NOT DETECTED Final   Parainfluenza Virus 4 NOT DETECTED NOT DETECTED Final   Respiratory Syncytial Virus NOT DETECTED NOT DETECTED Final   Bordetella pertussis NOT DETECTED NOT DETECTED Final   Bordetella Parapertussis NOT DETECTED NOT DETECTED Final   Chlamydophila pneumoniae NOT DETECTED NOT DETECTED Final   Mycoplasma pneumoniae NOT DETECTED NOT DETECTED Final    Comment: Performed at [facility], [place]. [address], [place], [place] [address]       Radiology Studies: No results found.     LOS: 10 days   Dametria Tuzzolino [employer] on [email or web address]  10/20/2021, 9:25 AM

## 2021-10-20 NOTE — Progress Notes (Signed)
Progress Note  Patient Name: Jillian Cooley Date of Encounter: 10/20/2021  [facility] Cardiologist: Werner Lean, MD   Subjective   Net negative 300cc yesterday but incomplete I/Os. Net negative 2.9L on admission.mild bump in creatinine (2.0 > 1 0.9 > 2.0 > 2.1 > 2.3) and sodium trending down (136 > 133 > 132).  BP 104/57.  Denies any dyspnea. Inpatient Medications    Scheduled Meds:  aspirin EC  81 mg Oral Daily   atorvastatin  20 mg Oral Daily   Chlorhexidine Gluconate Cloth  6 each Topical Daily   furosemide  80 mg Oral BID   hydrALAZINE  25 mg Oral Q8H   mouth rinse  15 mL Mouth Rinse BID   multivitamin with minerals  1 tablet Oral Daily   sodium chloride flush  3 mL Intravenous Q12H   Continuous Infusions:  sodium chloride Stopped (10/10/21 1902)   heparin 850 Units/hr (10/20/21 0406)   PRN Meds: sodium chloride, acetaminophen, docusate sodium, ondansetron (ZOFRAN) IV, polyethylene glycol   Vital Signs    Vitals:   10/19/21 1359 10/19/21 2212 10/20/21 0411 10/20/21 0811  BP: 112/64 (!) 110/58 (!) 102/54 (!) 104/57  Pulse:  99 99 97  Resp:  _0 Temp:  97.9 F (36.6 C) 97.9 F (36.6 C) 98.4 F (36.9 C)  TempSrc:  Oral Oral Oral  SpO2:  96% 96% 97%  Weight:   50.6 kg   Height:        Intake/Output Summary (Last 24 hours) at 10/20/2021 0814 Last data filed at 10/20/2021 0425 Gross per 24 hour  Intake 76.49 ml  Output 401 ml  Net -324.51 ml    Last 3 Weights 10/20/2021 10/18/2021 10/17/2021  Weight (lbs) 111 lb 8 oz 115 lb 15.4 oz 122 lb 2.2 oz  Weight (kg) 50.576 kg 52.6 kg 55.4 kg      Telemetry    Normal sinus rhythm with PVCs- Personally Reviewed  ECG    No new EKG to review- Personally Reviewed  Physical Exam  GEN: Thin and frail appearing HEENT: Normal NECK: No JVD; No carotid bruits LYMPHATICS: No lymphadenopathy CARDIAC:RRR, no murmurs, rubs, gallops RESPIRATORY:  crackles at left base ABDOMEN: Soft,  non-tender, non-distended MUSCULOSKELETAL:  No edema; No deformity  SKIN: Warm and dry NEUROLOGIC:  Alert and oriented x 3 PSYCHIATRIC:  Normal affect   Labs    High Sensitivity Troponin:   Recent Labs  Lab 10/10/21 0731 10/10/21 0935 10/10/21 1548 10/10/21 1822 10/12/21 0252  TROPONINIHS 523* 836* 3,307* 5,378* 5,247*      Chemistry Recent Labs  Lab 10/14/21 0456 10/15/21 0255 10/18/21 0152 10/19/21 0253 10/20/21 0156  NA 141   < > 136 133* 132*  K 4.1   < > 5.0 4.1 4.2  CL 108   < > 104 101 97*  CO2 23   < > _1 GLUCOSE 126*   < > 121* 111* 107*  BUN 86*   < > 61* 65* 68*  CREATININE 2.12*   < > 2.00* 2.10* 2.28*  CALCIUM 8.8*   < > 8.9 8.8* 9.1  MG 2.3  --  2.1  --   --   GFRNONAA 22*   < > 24* 22* 20*  ANIONGAP 10   < > _2 < > = values in this interval not displayed.     Lipids  No results for input(s): CHOL, TRIG, HDL, LABVLDL, LDLCALC, CHOLHDL  in the last 168 hours.   Hematology Recent Labs  Lab 10/18/21 0152 10/19/21 0253 10/20/21 0156  WBC 9.8 8.8 9.7  RBC 3.82* 3.68* 3.75*  HGB 11.3* 11.0* 11.1*  HCT 35.0* 33.9* 34.3*  MCV 91.6 92.1 91.5  MCH 29.6 29.9 29.6  MCHC 32.3 32.4 32.4  RDW 15.3 15.3 15.3  PLT 195 219 215    Thyroid No results for input(s): TSH, FREET4 in the last 168 hours.  BNP Recent Labs  Lab 10/16/21 0252  BNP 1,121.0*     DDimer No results for input(s): DDIMER in the last 168 hours.   Radiology    No results found.  Cardiac Studies   Echocardiogram 10/10/2021      1. Normal LV function; mild AI; severe MR; elevated mean gradient across  MV of 5 mmHg likely at least partially related to severe MR; moderate TR.   2. Left ventricular ejection fraction, by estimation, is 60 to 65%. The  left ventricle has normal function. The left ventricle has no regional  wall motion abnormalities. Left ventricular diastolic parameters are  indeterminate.   3. Right ventricular systolic function is normal. The right  ventricular  size is normal. There is moderately elevated pulmonary artery systolic  pressure.   4. Left atrial size was moderately dilated.   5. Large pleural effusion in the left lateral region.   6. The mitral valve is abnormal. Severe mitral valve regurgitation. Mild  mitral stenosis.   7. Tricuspid valve regurgitation is moderate.   8. The aortic valve has an indeterminant number of cusps. Aortic valve  regurgitation is mild. No aortic stenosis is present.   9. The inferior vena cava is normal in size with <50% respiratory  variability, suggesting right atrial pressure of 8 mmHg.        Right heart catheterization 10/14/2021   Conclusion   1. Right and left heart filling pressures are elevated.  2. Prominent v-wave suggestive of significant mitral regurgitation.  3. Cardiac output is normal.  4. Mixed pulmonary venous/pulmonary arterial HTN   Right Heart   Right Heart Pressures [facility] Procedural Findings: Hemodynamics (mmHg) RA mean 11 RV 66/9 PA 74/29, mean 49 PCWP mean 25, prominent V-waves to 37  Oxygen saturations: PA 62% AO 100%  Cardiac Output (Fick) 4.1  Cardiac Index (Fick) 2.6 PVR 5.8 WU      Patient Profile     87 y.o. female with multiple myeloma, HTN, HLD, valvular heart disease (at least moderate, probably severe MR), CKD stage 4, carpal tunnel, bilateral DVT/PE, pleural effusion s/p repeated thoracentesis admitted 10/21 with hypoxic respiratory failure and hypotension, lactic acidosis, elevated troponin, treated with IVF and stress dose steroids. She underwent L sided thoracentesis on 10/21 (1.5L) and repeat L sided thoracentesis on 10/23 (1.2L). Cytology -ve for malignancy.  Right heart catheterization performed 10/13/2021 showed mixed findings of acute left heart failure and intrinsic pulmonary artery hypertension (consider secondary to pulm embolism).  Transferred to [facility] for TEE to assess if MitraClip is an option, possibly involve HF team if  not.  Assessment & Plan    Acute diastolic heart failure: Echo 10/10/2021 with normal LV function, mild AI, severe MR, moderate TR.  [place] 10/13/2021 with RA 11, RV 66/9, PA 74/29/49, PCWP 25 with prominent V waves to 37, PA sat 62%, CI 2.6.  TEE 10/28 with thickened mitral valve with restricted leaflet motion and can measure all mall coaptation, severe MR, mild MS, EF 60 to 65%, mild RV dysfunction, mild  AI. -On review of the TEE, I think the MR appears more moderate, 3D VCA totaled 0.19cm^2 for the 2 jets.  In addition there is no murmur on exam. Given discordant parameters on TEE, would recommend cardiac MRI to quantify MR severity.  Given her multiple myeloma, this also will let us evaluate for evidence of amyloidosis (though unfortunately will not be able to give contrast given her renal function, which limits amyloid evaluation though will be able to do T1 mapping) -Continue hydralazine 25 mg 3 times daily for afterload reduction -Have avoided ACE/ARB given renal dysfunction -Creatinine trending up and worsening hyponatremia.  She does not appear volume overloaded on exam, will decrease Lasix to 80 mg daily  Mitral insufficiency: Described as moderate on outside echocardiogram performed in February (unable to review images), moderate (but possibly underestimated) by echo in May.   -She is not a surgical candidate for mitral valve repair/replacement.  -TEE showed severe thickening of the mitral valve leaflets with at least mild mitral stenosis with mean gradient 5 mmHg and severe mitral regurgitation with mechanism of MR being bileaflet restriction with poor commissural coaptation unfortunately likely not a candidate for MitraClip given her diastolic mitral gradient and bileaflet thickening.  On my review, I think MR may be more moderate, will check CMR as above -Continue hydralazine 25 mg 3 times daily  PAH: Mixed etiology due to diastolic heart failure and intrinsic pulmonary arterial disease  (possibly sequelae of previous pulmonary embolism).  Note that she had evidence of moderate to severe pulmonary hypertension by echocardiogram performed in February. -Continue diuretics with Lasix 80 mg p.o. twice daily  Acute on chronic respiratory failure with hypoxia: Improved with thoracentesis and diuresis, but pleural effusion re-accumulated fast.  -Chest x-ray 10/29 showed slightly increased atelectasis versus airspace disease in the left lower lung and little significant change in bilateral effusions with right lower lung consolidation/atelectasis -She has crackles at the left lung base but suspect this is more related to atelectasis -Ordereed incentive spirometry  NSTEMI: She does not have any regional wall motion abnormalities on the echocardiogram, but it is quite possible that she has underlying CAD based on the degree of increase in cardiac enzymes.   -She is not a candidate for invasive angiography and percutaneous revascularization or surgery.   -It is quite reasonable to treat her with aggressive lipid-lowering therapy.   -Continue aspirin 81 mg daily but this should be stopped when she restarts treatment with Eliquis for her pulmonary embolism  PE: Although this occurred in the setting of postoperative state, it is very likely that she has a hypercoagulable condition due to the hematological disorder.   -Currently on IV heparin until it is deemed that her pleural effusions are stable and will require no further thoracentesis and then restart Eliquis -Probably will remain on lifelong anticoagulation.  Sinus pauses: No significant bradycardia on telemetry overnight and mostly blocked PACs  Chronic kidney disease stage IV: Renal parameters back to baseline and serum creatinine is hovering around 1.9-2   Multiple myeloma: to discuss plans to resume therapy w her Oncologist.     For questions or updates, please contact [place] Please consult [email or web address] for contact info  under        Signed, Donato Heinz, MD  10/20/2021, 8:14 AM

## 2021-10-20 NOTE — Progress Notes (Signed)
Physical Therapy Treatment Patient Details Name: Jillian Cooley MRN: [id number] DOB: 04/07/1932 Today's Date: 10/20/2021   History of Present Illness 87 y.o. female admitted 10/10/21 with severe SOB; workup for acute hypoxic respiratory failure requiring bipap. CXR with large L effusion. S/p thoracentesis 10/21, repeat 10/23. [place] 10/24 showed normal CO, high filling pressures. PMH includes CHF (home O2 PRN), DVT/PE s/p rotator cuff repair (04/2021).    PT Comments    BP readings for orthostatics were taken with following results: Supine 106/59, sat 96% and pulse 94 Sitting 100/59, sat 96% and pulse 111 Standing 75/42, pulse 115, sat 96% Post gait on side of bed 98/77 pulse 119 Pt is light headed with standing and walking, but did report walking on the hall earlier with mobility tech.  Follow up with her to work on movement as her vitals permit, to focus on her strength and tolerance for being OOB.  Encourage OOB to chair to work toward control of BP.     Recommendations for follow up therapy are one component of a multi-disciplinary discharge planning process, led by the attending physician.  Recommendations may be updated based on patient status, additional functional criteria and insurance authorization.  Follow Up Recommendations  Home health PT     Assistance Recommended at Discharge Frequent or constant Supervision/Assistance  Equipment Recommendations  Rolling walker (2 wheels)    Recommendations for Other Services       Precautions / Restrictions Precautions Precautions: Fall Precaution Comments: monitor for orthostatics Restrictions Weight Bearing Restrictions: No     Mobility  Bed Mobility Overal bed mobility: Needs Assistance Bed Mobility: Supine to Sit;Sit to Supine     Supine to sit: Min guard Sit to supine: Min assist        Transfers Overall transfer level: Needs assistance Equipment used: Rolling walker (2 wheels);1 person hand held  assist Transfers: Sit to/from Stand Sit to Stand: Min guard           General transfer comment: min guard due to feeling lightheaded, ck of orthostatics    Ambulation/Gait Ambulation/Gait assistance: Min assist Gait Distance (Feet): 15 Feet Assistive device: Rolling walker (2 wheels);1 person hand held assist Gait Pattern/deviations: Step-to pattern;Decreased stride length;Wide base of support Gait velocity: Decreased Gait velocity interpretation: <1.31 ft/sec, indicative of household ambulator General Gait Details: min assist to steady on walker and to control balance with light headed feelings   Stairs             Wheelchair Mobility    Modified Rankin (Stroke Patients Only)       Balance Overall balance assessment: Needs assistance Sitting-balance support: Feet supported Sitting balance-Leahy Scale: Good     Standing balance support: Bilateral upper extremity supported;During functional activity Standing balance-Leahy Scale: Fair Standing balance comment: less than fair dynamically                            Cognition Arousal/Alertness: Awake/alert Behavior During Therapy: WFL for tasks assessed/performed Overall Cognitive Status: Within Functional Limits for tasks assessed                                          Exercises      General Comments General comments (skin integrity, edema, etc.): Pt was put into supine and ck was 106/59 with 96% sat and pulse 94;  sitting 100/59  and sat 96% pulse 111, standing 75/42 with light headed feeling, pulse 115 sat 96%.  Post sidestepping was 98/77 pulse119      Pertinent Vitals/Pain Pain Assessment: No/denies pain Faces Pain Scale: No hurt    Home Living                          Prior Function            PT Goals (current goals can now be found in the care plan section) Acute Rehab PT Goals Patient Stated Goal: go home Progress towards PT goals: Not progressing  toward goals - comment    Frequency    Min 3X/week      PT Plan Current plan remains appropriate;Other (comment) (updating assistance)    Co-evaluation              AM-PAC PT "6 Clicks" Mobility   Outcome Measure  Help needed turning from your back to your side while in a flat bed without using bedrails?: None Help needed moving from lying on your back to sitting on the side of a flat bed without using bedrails?: A Little Help needed moving to and from a bed to a chair (including a wheelchair)?: A Little Help needed standing up from a chair using your arms (e.g., wheelchair or bedside chair)?: A Little Help needed to walk in hospital room?: A Little Help needed climbing 3-5 steps with a railing? : A Little 6 Click Score: 19    End of Session Equipment Utilized During Treatment: Gait belt Activity Tolerance: Patient limited by fatigue;Treatment limited secondary to medical complications (Comment) Patient left: in bed;with call bell/phone within reach;with bed alarm set;with family/visitor present Nurse Communication: Mobility status;Other (comment) (BP drops) PT Visit Diagnosis: Other abnormalities of gait and mobility (R26.89);Muscle weakness (generalized) (M62.81)     Time: 2130-8657 PT Time Calculation (min) (ACUTE ONLY): 13 min  Charges:  $Therapeutic Activity: 8-22 mins           Ramond Dial 10/20/2021, 5:16 PM  Mee Hives, PT PhD Acute Rehab Dept. Number: [place] [facility] [place]

## 2021-10-20 NOTE — Progress Notes (Signed)
OT Cancellation Note  Patient Details Name: Jillian Cooley MRN: [id number] DOB: 04/19/1932   Cancelled Treatment:    Reason Eval/Treat Not Completed: Patient at procedure or test/ unavailable  Patient out of room, therapy will follow back if time permits.   [name] E. [place], [place] [facility] [phone] [place] 10/20/2021, 11:34 AM

## 2021-10-21 ENCOUNTER — Inpatient Hospital Stay (HOSPITAL_COMMUNITY): Payer: Medicare PPO

## 2021-10-21 DIAGNOSIS — I5033 Acute on chronic diastolic (congestive) heart failure: Secondary | ICD-10-CM | POA: Diagnosis not present

## 2021-10-21 DIAGNOSIS — J90 Pleural effusion, not elsewhere classified: Secondary | ICD-10-CM | POA: Diagnosis not present

## 2021-10-21 DIAGNOSIS — N184 Chronic kidney disease, stage 4 (severe): Secondary | ICD-10-CM | POA: Diagnosis not present

## 2021-10-21 DIAGNOSIS — N179 Acute kidney failure, unspecified: Secondary | ICD-10-CM | POA: Diagnosis not present

## 2021-10-21 LAB — CBC
HCT: 32.8 % — ABNORMAL LOW (ref 36.0–46.0)
Hemoglobin: 10.8 g/dL — ABNORMAL LOW (ref 12.0–15.0)
MCH: 29.9 pg (ref 26.0–34.0)
MCHC: 32.9 g/dL (ref 30.0–36.0)
MCV: 90.9 fL (ref 80.0–100.0)
Platelets: 209 10*3/uL (ref 150–400)
RBC: 3.61 MIL/uL — ABNORMAL LOW (ref 3.87–5.11)
RDW: 15.5 % (ref 11.5–15.5)
WBC: 7.7 10*3/uL (ref 4.0–10.5)
nRBC: 0 % (ref 0.0–0.2)

## 2021-10-21 LAB — BASIC METABOLIC PANEL
Anion gap: 10 (ref 5–15)
BUN: 66 mg/dL — ABNORMAL HIGH (ref 8–23)
CO2: 26 mmol/L (ref 22–32)
Calcium: 9.2 mg/dL (ref 8.9–10.3)
Chloride: 98 mmol/L (ref 98–111)
Creatinine, Ser: 2.45 mg/dL — ABNORMAL HIGH (ref 0.44–1.00)
GFR, Estimated: 19 mL/min — ABNORMAL LOW (ref >60)
Glucose, Bld: 107 mg/dL — ABNORMAL HIGH (ref 70–99)
Potassium: 4.1 mmol/L (ref 3.5–5.1)
Sodium: 134 mmol/L — ABNORMAL LOW (ref 135–145)

## 2021-10-21 LAB — ACID FAST SMEAR (AFB, MYCOBACTERIA): Acid Fast Smear: NEGATIVE

## 2021-10-21 LAB — HEPARIN LEVEL (UNFRACTIONATED): Heparin Unfractionated: 0.63 IU/mL (ref 0.30–0.70)

## 2021-10-21 NOTE — Progress Notes (Signed)
[facility] PROGRESS NOTE   Jillian Cooley ZSW:[id number] DOB: 1932-07-20 DOA: 10/10/2021  PCP: [name], MD  Brief History/Interval Summary: 87 y.o. F with dCHF on home O2 PRN, recent Dx MM not yet started chemo, DVT and PE after rotator cuff surgery last May, on apixaban, as well as recurrent transudative pleural effusion (see below) who presented with slowly progressive SOB, finally severe.  Required BiPAP initially.  Was found to have a large left-sided pleural effusion.  Was admitted to the intensive care unit.  Underwent thoracentesis x2 during this hospital stay.  Seen by cardiology as well.  Underwent right heart catheterization.  Subsequently underwent TEE.  Then underwent cardiac MRI.  Plan is to repeat echocardiogram tomorrow for pericardial effusion.  Monitor creatinine closely.  Reason for Visit: Pleural effusion  Consultants: Pulmonology.  Cardiology  Procedures:  Thoracentesis x2.   Right heart catheterization. Cardiac MRI   Subjective/Interval History: Patient denies any complaints this morning although she did mention that she got quite lightheaded yesterday when she worked with physical therapy but her symptoms stabilized.  She was told how to mobilize herself to prevent significant drops in blood pressure when she goes from lying to standing position  Denies any chest pain.  No worsening shortness of breath compared to yesterday.  Son is at the bedside.   Assessment/Plan:  Acute on chronic respiratory failure with hypoxia Secondary to CHF along with pleural effusion.  Uses oxygen as needed at home.  After thoracentesis she was able to come off of oxygen.  Doing well on room air.  Left-sided pleural effusion Initially diagnosed in February.  Seen by pulmonology.  Has undergone multiple thoracentesis including during this hospital stay.  Patient underwent thoracentesis on 10/28.  Fluid analysis is consistent with a transudative process.   Thought to be secondary to cardiac process.   Chest x-ray was repeated on 10/29 and showed atelectasis.  No significant pleural effusion was noted.  Chest x-ray was repeated today by cardiology which shows an increase in the left-sided pleural effusion.  Changes suggestive of atelectasis versus pneumonitis noticed.  No history suggestive of infectious process.  Likely atelectasis.  Will prescribe incentive spirometer.   Patient has been seen by pulmonology in the outpatient setting and will need follow-up with them again soon.  Acute on chronic diastolic CHF/NSTEMI/Mobitz type I AV block Has been on and off of diuretics during this hospital stay mainly driven by creatinine and symptoms.   Patient was transitioned to oral furosemide 80 mg twice a day.   Creatinine was noted to be higher yesterday at 2.28.  Dose of Lasix was decreased.  Creatinine noted to be 2.45 today.  We will wait for renal function to equilibrate.  She continues to have good urine output.  Recheck labs tomorrow.  Discussed with cardiology today.  If renal function continues to worsen then may need to involve nephrology. Continue to monitor urine output.  Strict ins and outs and daily weight.   Patient was found to have elevated troponin levels though doubt acute coronary syndrome.   Patient is on aspirin and statin.  Not noted to be on beta-blocker at this time.  Not on ACE inhibitor or ARB due to elevated creatinine.  Mitral regurgitation Cardiac MRI suggest moderate mitral regurgitation.  TEE had suggested severe mitral regurgitation.  Further plans per cardiology.   Acute kidney injury superimposed on chronic kidney disease stage IV/hyponatremia Baseline creatinine is around 1.8-2.1.  It was up to 2.7.  Had  improved to 1.9.   As discussed above creatinine has been creeping up.  Dose of Lasix was decreased yesterday.  We will wait for creatinine to equilibrate.  May have to tolerate a higher level of creatinine in this patient  with CHF requiring diuretics.  Sodium level has improved 134   History of bilateral pulmonary embolism diagnosed in May Was on apixaban.  Currently on IV heparin until all procedures have been completed.  Respiratory status is stable.  Hopefully transition to apixaban at the time of discharge.  Should not need aspirin at discharge.  Pulmonary artery hypertension Cardiology and pulmonology has been following.  Essential hypertension/orthostatic hypotension/hypokalemia Orthostatic drop in blood pressure noted when she worked with physical therapy yesterday.  Likely due to her multiple comorbidities and heart conditions along with the use of diuretics.  Patient was asked to get up slowly from sitting and lying position.  TED stockings may also help.  Remains on furosemide and hydralazine.  Potassium is stable   History of multiple myeloma Followed by [name] with oncology.  Has not been started on treatment yet due to her other comorbidities and acute issues.  Patient has an appointment with [name] on 10/23/2021 at 2:40 PM.  This may need to be rescheduled if she is still here in the hospital.  Normocytic anemia Stable.  No evidence of overt bleeding.  Severe protein calorie malnutrition Nutrition Problem: Severe Malnutrition Etiology: chronic illness  Signs/Symptoms: severe fat depletion, severe muscle depletion  Interventions: Magic cup, MVI   DVT Prophylaxis: On IV heparin Code Status: Full code Family Communication: Discussed with patient and her son Disposition Plan: Home health recommended by PT  Status is: Inpatient  Remains inpatient appropriate because: Need for procedures that cannot be done in the outpatient setting and IV diuretics     Medications: Scheduled:  aspirin EC  81 mg Oral Daily   atorvastatin  20 mg Oral Daily   furosemide  80 mg Oral Daily   hydrALAZINE  25 mg Oral Q8H   mouth rinse  15 mL Mouth Rinse BID   multivitamin with minerals  1 tablet  Oral Daily   sodium chloride flush  3 mL Intravenous Q12H   Continuous:  sodium chloride Stopped (10/10/21 1902)   heparin 850 Units/hr (10/21/21 0516)   ERD:EYCXKG chloride, acetaminophen, docusate sodium, ondansetron (ZOFRAN) IV, polyethylene glycol  Antibiotics: Anti-infectives (From admission, onward)    Start     Dose/Rate Route Frequency Ordered Stop   10/10/21 1900  cefTRIAXone (ROCEPHIN) 2 g in sodium chloride 0.9 % 100 mL IVPB  Status:  Discontinued        2 g 200 mL/hr over 30 Minutes Intravenous Every 24 hours 10/10/21 1457 10/11/21 1039   10/10/21 1900  azithromycin (ZITHROMAX) 500 mg in sodium chloride 0.9 % 250 mL IVPB  Status:  Discontinued        500 mg 250 mL/hr over 60 Minutes Intravenous Every 24 hours 10/10/21 1457 10/11/21 1015   10/10/21 1045  piperacillin-tazobactam (ZOSYN) IVPB 3.375 g        3.375 g 100 mL/hr over 30 Minutes Intravenous  Once 10/10/21 1034 10/10/21 1255   10/10/21 1030  piperacillin-tazobactam (ZOSYN) IVPB 2.25 g  Status:  Discontinued        2.25 g 100 mL/hr over 30 Minutes Intravenous  Once 10/10/21 1017 10/10/21 1034       Objective:  Vital Signs  Vitals:   10/20/21 2315 10/21/21 0441 10/21/21 0505 10/21/21 1000  BP: 102/61  (!) 108/51   Pulse: 94  97 99  Resp: _0 Temp: 97.8 F (36.6 C) (!) 97.5 F (36.4 C) 98 F (36.7 C)   TempSrc: Oral Oral Oral   SpO2: 95%  96% 97%  Weight:   50.1 kg   Height:        Intake/Output Summary (Last 24 hours) at 10/21/2021 1135 Last data filed at 10/21/2021 1000 Gross per 24 hour  Intake 1013.8 ml  Output 1000 ml  Net 13.8 ml    Filed Weights   10/18/21 0335 10/20/21 0411 10/21/21 0505  Weight: 52.6 kg 50.6 kg 50.1 kg    General appearance: Awake alert.  In no distress Resp: Normal effort at rest.  Few crackles at the bases bilaterally with diminished air entry.  No wheezing or rhonchi. Cardio: S1-S2 is normal regular.  No S3-S4.  No rubs murmurs or bruit GI: Abdomen is  soft.  Nontender nondistended.  Bowel sounds are present normal.  No masses organomegaly Extremities: Minimal edema bilateral lower extremities Neurologic: Alert and oriented x3.  No focal neurological deficits.      Lab Results:  Data Reviewed: I have personally reviewed following labs and imaging studies  CBC: Recent Labs  Lab 10/17/21 0329 10/18/21 0152 10/19/21 0253 10/20/21 0156 10/21/21 0235  WBC 9.7 9.8 8.8 9.7 7.7  HGB 10.9* 11.3* 11.0* 11.1* 10.8*  HCT 33.8* 35.0* 33.9* 34.3* 32.8*  MCV 91.6 91.6 92.1 91.5 90.9  PLT 228 195 219 215 209     Basic Metabolic Panel: Recent Labs  Lab 10/17/21 0329 10/18/21 0152 10/19/21 0253 10/20/21 0156 10/21/21 0235  NA 137 136 133* 132* 134*  K 4.1 5.0 4.1 4.2 4.1  CL 104 104 101 97* 98  CO2 _1 GLUCOSE 101* 121* 111* 107* 107*  BUN 65* 61* 65* 68* 66*  CREATININE 1.90* 2.00* 2.10* 2.28* 2.45*  CALCIUM 8.8* 8.9 8.8* 9.1 9.2  MG  --  2.1  --   --   --      GFR: Estimated Creatinine Clearance: 12.8 mL/min (A) (by C-G formula based on SCr of 2.45 mg/dL (H)).    No results found for this or any previous visit (from the past 240 hour(s)).      Radiology Studies: DG CHEST PORT 1 VIEW  Result Date: 10/21/2021 CLINICAL DATA:  Shortness of breath, pleural effusion EXAM: PORTABLE CHEST 1 VIEW COMPARISON:  Previous studies including the examination of 10/18/2021 FINDINGS: Transverse diameter of heart is increased. Central pulmonary vessels are prominent. Bilateral pleural effusions are seen, more so on the left side. There is increase in amount of left pleural effusion. Increased markings are seen in both lower lung fields suggesting underlying atelectasis/pneumonia. There is no pneumothorax. IMPRESSION: Cardiomegaly. Bilateral pleural effusions. There is interval increase in amount of left pleural effusion. Increased markings are seen in both lower lung fields suggesting atelectasis/pneumonitis. Electronically  Signed   By: Elmer Picker M.D.   On: 10/21/2021 10:47   MR CARDIAC MORPHOLOGY WO CONTRAST  Result Date: 10/20/2021 CLINICAL DATA:  Evaluate mitral regurgitation EXAM: CARDIAC MRI TECHNIQUE: The patient was scanned on a 1.5 Tesla Siemens magnet. A dedicated cardiac coil was used. Functional imaging was done using Fiesta sequences. 2,3, and 4 chamber views were done to assess for RWMA's. Modified Simpson's rule using a short axis stack was used to calculate an ejection fraction on a dedicated work Conservation officer, nature. The patient  did not receive Gadavist. CONTRAST:  No contrast administered FINDINGS: Left ventricle: -Asymmetric hypertrophy measuring 46m in basal septum, 874min posterior wall -T1 elevated at 110090mNormal size -Normal systolic function LV EF:  63% (Normal 56-78%) Absolute volumes: LV EDV: 53m95mormal 52-141 mL) LV ESV: 24mL70mrmal 13-51 mL) LV SV: 42mL 36mmal 33-97 mL) CO: 4.0L/min (Normal 2.7-6.0 L/min) Indexed volumes: LV EDV: 44mL/s61m(Normal 41-81 mL/sq-m) LV ESV: 16mL/sq14mNormal 12-21 mL/sq-m) LV SV: 28mL/sq-47mormal 26-56 mL/sq-m) CI: 2.7L/min/sq-m (Normal 1.8-3.8 L/min/sq-m) Right ventricle: Normal size and systolic function RV EF: 54% (Norm68%47-80%) Absolute volumes: RV EDV: 72mL (Nor11m58-154 mL) RV ESV: 33mL (Norm21m2-68 mL) RV SV: 39mL (Norma55m-98 mL) CO: 3.8L/min (Normal 2.7-6 L/min) Indexed volumes: RV EDV: 48mL/sq-m (N62ml 48-87 mL/sq-m) RV ESV: 22mL/sq-m (No106m 11-28 mL/sq-m) RV SV: 26mL/sq-m (Nor35m27-57 mL/sq-m) CI: 2.5L/min/sq-m (Normal 1.8-3.8 L/min/sq-m) Left atrium: Normal size Right atrium: Normal size Mitral valve: Moderate regurgitation (regurgitant fraction 21%) Aortic valve: Mild to moderate regurgitation (regurgitant fraction 18%) Tricuspid valve: Moderate regurgitation (regurgitant fraction 28%) Pulmonic valve: No regurgitation Aorta: Normal proximal ascending aorta Pericardium: Small to moderate effusion measuring 10mm adjacent t53m  inferior wall Extracardiac structures: Moderate bilateral pleural effusions IMPRESSION: 1. Moderate asymmetric LV hypertrophy measuring 15mm in basal se93m (8mm in posterior 80ml) 2. Elevated myocardial native T1 values (1100ms), which could65mconsistent with amyloidosis, though contrast was not administered due to renal function which reduces specificity of T1 findings, as unable to calculate ECV or assess for LGE to confirm amyloidosis 3.  Normal LV size and systolic function (EF 63%) 4.  Normal RV 15%e and systolic function (EF 54%) 5.  Moderate m94%al regurgitation (regurgitant fraction 21%) 6.  Moderate tricuspid regurgitation (regurgitant fraction 28%) 7.  Mild to moderate aortic regurgitation (regurgitant fraction 18%) 8. Small to moderate pericardial effusion measuring 10mm adjacent to LV23merior wall 9.  Moderate bilateral pleural effusions Electronically Signed   By: Christopher  SchumanOswaldo Milian22 21:56       LOS: 11 days   Jodene Polyak  [email or web address]  10/21/2021, 11:35 AM

## 2021-10-21 NOTE — Progress Notes (Addendum)
Progress Note  Patient Name: Jillian Cooley Date of Encounter: 10/21/2021  [place] Cardiologist: Werner Lean, MD   Subjective   Incomplete I/Os yesterday, recorded as -60 cc, -3 L on admission.  Weight is down 1pound to 111 pounds, from peak weight 124 pounds.  Creatinine continues to trend up (1.9 > 2 0.0 > 2.1 > 2.3 > 2.5).  Sodium improved today (132 > 134).  Denies any dyspnea.  Inpatient Medications    Scheduled Meds:  aspirin EC  81 mg Oral Daily   atorvastatin  20 mg Oral Daily   furosemide  80 mg Oral Daily   hydrALAZINE  25 mg Oral Q8H   mouth rinse  15 mL Mouth Rinse BID   multivitamin with minerals  1 tablet Oral Daily   sodium chloride flush  3 mL Intravenous Q12H   Continuous Infusions:  sodium chloride Stopped (10/10/21 1902)   heparin 850 Units/hr (10/21/21 0516)   PRN Meds: sodium chloride, acetaminophen, docusate sodium, ondansetron (ZOFRAN) IV, polyethylene glycol   Vital Signs    Vitals:   10/20/21 2032 10/20/21 2315 10/21/21 0441 10/21/21 0505  BP: 90/65 102/61  (!) 108/51  Pulse: 99 94  97  Resp: $Remo'17 20  12  'RFrQk$ Temp: 97.6 F (36.4 C) 97.8 F (36.6 C) (!) 97.5 F (36.4 C) 98 F (36.7 C)  TempSrc: Oral Oral Oral Oral  SpO2: 96% 95%  96%  Weight:    50.1 kg  Height:        Intake/Output Summary (Last 24 hours) at 10/21/2021 0806 Last data filed at 10/20/2021 1812 Gross per 24 hour  Intake 941.5 ml  Output 1000 ml  Net -58.5 ml    Last 3 Weights 10/21/2021 10/20/2021 10/18/2021  Weight (lbs) 110 lb 8 oz 111 lb 8 oz 115 lb 15.4 oz  Weight (kg) 50.122 kg 50.576 kg 52.6 kg      Telemetry    Normal sinus rhythm with PVCs- Personally Reviewed  ECG    No new EKG to review- Personally Reviewed  Physical Exam  GEN: Thin and frail appearing HEENT: Normal NECK: No JVD LYMPHATICS: No lymphadenopathy CARDIAC:RRR, no murmurs, rubs, gallops RESPIRATORY:  diminished at left base ABDOMEN: Soft, non-tender,  non-distended MUSCULOSKELETAL:  No edema; No deformity  SKIN: Warm and dry NEUROLOGIC:  Alert and oriented x 3 PSYCHIATRIC:  Normal affect   Labs    High Sensitivity Troponin:   Recent Labs  Lab 10/10/21 0731 10/10/21 0935 10/10/21 1548 10/10/21 1822 10/12/21 0252  TROPONINIHS 523* 836* 3,307* 5,378* 5,247*      Chemistry Recent Labs  Lab 10/18/21 0152 10/19/21 0253 10/20/21 0156 10/21/21 0235  NA 136 133* 132* 134*  K 5.0 4.1 4.2 4.1  CL 104 101 97* 98  CO2 $Re'25 24 25 26  'eFD$ GLUCOSE 121* 111* 107* 107*  BUN 61* 65* 68* 66*  CREATININE 2.00* 2.10* 2.28* 2.45*  CALCIUM 8.9 8.8* 9.1 9.2  MG 2.1  --   --   --   GFRNONAA 24* 22* 20* 19*  ANIONGAP $RemoveB'7 8 10 10     'ncDEWLPV$ Lipids  No results for input(s): CHOL, TRIG, HDL, LABVLDL, LDLCALC, CHOLHDL in the last 168 hours.   Hematology Recent Labs  Lab 10/19/21 0253 10/20/21 0156 10/21/21 0235  WBC 8.8 9.7 7.7  RBC 3.68* 3.75* 3.61*  HGB 11.0* 11.1* 10.8*  HCT 33.9* 34.3* 32.8*  MCV 92.1 91.5 90.9  MCH 29.9 29.6 29.9  MCHC 32.4 32.4 32.9  RDW 15.3 15.3 15.5  PLT 219 215 209    Thyroid No results for input(s): TSH, FREET4 in the last 168 hours.  BNP Recent Labs  Lab 10/16/21 0252  BNP 1,121.0*     DDimer No results for input(s): DDIMER in the last 168 hours.   Radiology    MR CARDIAC MORPHOLOGY WO CONTRAST  Result Date: 10/20/2021 CLINICAL DATA:  Evaluate mitral regurgitation EXAM: CARDIAC MRI TECHNIQUE: The patient was scanned on a 1.5 Tesla Siemens magnet. A dedicated cardiac coil was used. Functional imaging was done using Fiesta sequences. 2,3, and 4 chamber views were done to assess for RWMA's. Modified Simpson's rule using a short axis stack was used to calculate an ejection fraction on a dedicated work Conservation officer, nature. The patient did not receive Gadavist. CONTRAST:  No contrast administered FINDINGS: Left ventricle: -Asymmetric hypertrophy measuring 21mm in basal septum, 80mm in posterior wall -T1  elevated at 1125ms -Normal size -Normal systolic function LV EF:  63% (Normal 56-78%) Absolute volumes: LV EDV: 31mL (Normal 52-141 mL) LV ESV: 18mL (Normal 13-51 mL) LV SV: 23mL (Normal 33-97 mL) CO: 4.0L/min (Normal 2.7-6.0 L/min) Indexed volumes: LV EDV: 50mL/sq-m (Normal 41-81 mL/sq-m) LV ESV: 71mL/sq-m (Normal 12-21 mL/sq-m) LV SV: 66mL/sq-m (Normal 26-56 mL/sq-m) CI: 2.7L/min/sq-m (Normal 1.8-3.8 L/min/sq-m) Right ventricle: Normal size and systolic function RV EF: 26% (Normal 47-80%) Absolute volumes: RV EDV: 23mL (Normal 58-154 mL) RV ESV: 61mL (Normal 12-68 mL) RV SV: 15mL (Normal 35-98 mL) CO: 3.8L/min (Normal 2.7-6 L/min) Indexed volumes: RV EDV: 31mL/sq-m (Normal 48-87 mL/sq-m) RV ESV: 56mL/sq-m (Normal 11-28 mL/sq-m) RV SV: 35mL/sq-m (Normal 27-57 mL/sq-m) CI: 2.5L/min/sq-m (Normal 1.8-3.8 L/min/sq-m) Left atrium: Normal size Right atrium: Normal size Mitral valve: Moderate regurgitation (regurgitant fraction 21%) Aortic valve: Mild to moderate regurgitation (regurgitant fraction 18%) Tricuspid valve: Moderate regurgitation (regurgitant fraction 28%) Pulmonic valve: No regurgitation Aorta: Normal proximal ascending aorta Pericardium: Small to moderate effusion measuring 33mm adjacent to LV inferior wall Extracardiac structures: Moderate bilateral pleural effusions IMPRESSION: 1. Moderate asymmetric LV hypertrophy measuring 64mm in basal septum (84mm in posterior wall) 2. Elevated myocardial native T1 values (1132ms), which could be consistent with amyloidosis, though contrast was not administered due to renal function which reduces specificity of T1 findings, as unable to calculate ECV or assess for LGE to confirm amyloidosis 3.  Normal LV size and systolic function (EF 94%) 4.  Normal RV size and systolic function (EF 85%) 5.  Moderate mitral regurgitation (regurgitant fraction 21%) 6.  Moderate tricuspid regurgitation (regurgitant fraction 28%) 7.  Mild to moderate aortic regurgitation (regurgitant  fraction 18%) 8. Small to moderate pericardial effusion measuring 70mm adjacent to LV inferior wall 9.  Moderate bilateral pleural effusions Electronically Signed   By: Oswaldo Milian M.D.   On: 10/20/2021 21:56    Cardiac Studies   Echocardiogram 10/10/2021      1. Normal LV function; mild AI; severe MR; elevated mean gradient across  MV of 5 mmHg likely at least partially related to severe MR; moderate TR.   2. Left ventricular ejection fraction, by estimation, is 60 to 65%. The  left ventricle has normal function. The left ventricle has no regional  wall motion abnormalities. Left ventricular diastolic parameters are  indeterminate.   3. Right ventricular systolic function is normal. The right ventricular  size is normal. There is moderately elevated pulmonary artery systolic  pressure.   4. Left atrial size was moderately dilated.   5. Large pleural effusion in the left lateral  region.   6. The mitral valve is abnormal. Severe mitral valve regurgitation. Mild  mitral stenosis.   7. Tricuspid valve regurgitation is moderate.   8. The aortic valve has an indeterminant number of cusps. Aortic valve  regurgitation is mild. No aortic stenosis is present.   9. The inferior vena cava is normal in size with <50% respiratory  variability, suggesting right atrial pressure of 8 mmHg.        Right heart catheterization 10/14/2021   Conclusion   1. Right and left heart filling pressures are elevated.  2. Prominent v-wave suggestive of significant mitral regurgitation.  3. Cardiac output is normal.  4. Mixed pulmonary venous/pulmonary arterial HTN   Right Heart   Right Heart Pressures RHC Procedural Findings: Hemodynamics (mmHg) RA mean 11 RV 66/9 PA 74/29, mean 49 PCWP mean 25, prominent V-waves to 37  Oxygen saturations: PA 62% AO 100%  Cardiac Output (Fick) 4.1  Cardiac Index (Fick) 2.6 PVR 5.8 WU      Patient Profile     87 y.o. female with multiple myeloma,  HTN, HLD, valvular heart disease (at least moderate, probably severe MR), CKD stage 4, carpal tunnel, bilateral DVT/PE, pleural effusion s/p repeated thoracentesis admitted 10/21 with hypoxic respiratory failure and hypotension, lactic acidosis, elevated troponin, treated with IVF and stress dose steroids. She underwent L sided thoracentesis on 10/21 (1.5L) and repeat L sided thoracentesis on 10/23 (1.2L). Cytology -ve for malignancy.  Right heart catheterization performed 10/13/2021 showed mixed findings of acute left heart failure and intrinsic pulmonary artery hypertension (consider secondary to pulm embolism).  Transferred to [facility] for TEE to assess if MitraClip is an option, possibly involve HF team if not.  Assessment & Plan    Acute diastolic heart failure: Echo 10/10/2021 with normal LV function, mild AI, severe MR, moderate TR.  [place] 10/13/2021 with RA 11, RV 66/9, PA 74/29/49, PCWP 25 with prominent V waves to 37, PA sat 62%, CI 2.6.  TEE 10/28 with thickened mitral valve with restricted leaflet motion and can measure all mall coaptation, severe MR, mild MS, EF 60 to 65%, mild RV dysfunction, mild AI. -On review of the TEE, I think the MR appears more moderate, 3D VCA totaled 0.19cm^2 for the 2 jets.  In addition there is no murmur on exam. Given discordant parameters on TEE, recommended cardiac MRI to quantify MR severity, which showed moderate MR (regurgitant fraction 21%)   -Given her multiple myeloma, cardiac MRI was also done to evaluate for evidence of amyloidosis.  Cardiac MRI does show some features of amyloid (LVH, elevated native T1) though nonspecific findings as unable to give contrast due to renal function, so not able to evaluate ECV or LGE to confirm amyloidosis  -Continue hydralazine 25 mg 3 times daily for afterload reduction -Have avoided ACE/ARB given renal dysfunction -Creatinine trending up and worsening hyponatremia, decrease Lasix to 80 mg daily yesterday.  Would recommend  nephrology evaluation if creatinine continues to trend up -Pleural effusions appeared more significant on MRI, will check chest x-ray  Mitral insufficiency: Described as moderate on outside echocardiogram performed in February (unable to review images), moderate (but possibly underestimated) by echo in May.  TEE concerning for severe MR though there were discordant parameters as above (3D VCA 0.19cm^2); cardiac MRI shows moderate MR (regurgitant fraction 21%)  PAH: Mixed etiology due to diastolic heart failure and intrinsic pulmonary arterial disease (possibly sequelae of previous pulmonary embolism).  Note that she had evidence of moderate to severe pulmonary  hypertension by echocardiogram performed in February. -Continue diuretics with Lasix 80 mg daily  Acute on chronic respiratory failure with hypoxia: Improved with thoracentesis and diuresis, but pleural effusion re-accumulated fast.  -Chest x-ray 10/29 showed slightly increased atelectasis versus airspace disease in the left lower lung and little significant change in bilateral effusions with right lower lung consolidation/atelectasis -Repeat CXR today  NSTEMI: She does not have any regional wall motion abnormalities on the echocardiogram, but it is quite possible that she has underlying CAD based on the degree of increase in cardiac enzymes.   -She is not a candidate for invasive angiography and percutaneous revascularization or surgery.   -It is quite reasonable to treat her with aggressive lipid-lowering therapy.   -Continue aspirin 81 mg daily but this should be stopped when she restarts treatment with Eliquis for her pulmonary embolism  PE: Although this occurred in the setting of postoperative state, it is very likely that she has a hypercoagulable condition due to the hematological disorder.   -Currently on IV heparin until it is deemed that her pleural effusions are stable and will require no further thoracentesis and then restart  Eliquis -Probably will remain on lifelong anticoagulation.  Sinus pauses: No significant bradycardia on telemetry overnight and mostly blocked PACs  AKI on Chronic kidney disease stage IV: Creatinine increasing, up to 2.5 today.  Low threshold to involve nephrology  Multiple myeloma: to discuss plans to resume therapy w her Oncologist.  Pericardial effusion: small to moderate on CMR 10/31, did not have effusion on prior echo. Will plan limited echo tomorrow for further evaluation     For questions or updates, please contact [place] Please consult [email or web address] for contact info under        Signed, Donato Heinz, MD  10/21/2021, 8:06 AM

## 2021-10-21 NOTE — Progress Notes (Signed)
   10/21/21 1308  Assess: MEWS Score  BP (!) 100/57  Pulse Rate (!) 104  ECG Heart Rate (!) 104  Resp 11  SpO2 95 %  Assess: MEWS Score  MEWS Temp 0  MEWS Systolic 1  MEWS Pulse 1  MEWS RR 1  MEWS LOC 0  MEWS Score 3  MEWS Score Color Yellow  Assess: if the MEWS score is Yellow or Red  Were vital signs taken at a resting state? Yes  Focused Assessment No change from prior assessment  Early Detection of Sepsis Score *See Row Information* Low  MEWS guidelines implemented *See Row Information* Yes  Treat  MEWS Interventions Escalated (See documentation below)  Pain Scale 0-10  Pain Score 0  Take Vital Signs  Increase Vital Sign Frequency  Yellow: Q 2hr X 2 then Q 4hr X 2, if remains yellow, continue Q 4hrs  Escalate  MEWS: Escalate Yellow: discuss with charge nurse/RN and consider discussing with provider and RRT  Notify: Charge Nurse/RN  Name of Charge Nurse/RN Notified Doctor, general practice  Date Charge Nurse/RN Notified 10/21/21  Time Charge Nurse/RN Notified 1310  Document  Patient Outcome Stabilized after interventions  Progress note created (see row info) Yes  Assess: SIRS CRITERIA  SIRS Temperature  0  SIRS Pulse 1  SIRS Respirations  0  SIRS WBC 0  SIRS Score Sum  1

## 2021-10-21 NOTE — Plan of Care (Signed)

## 2021-10-21 NOTE — Progress Notes (Signed)
Jillian Cooley for Heparin (apixaban PTA) Indication: NSTEMI and PE  Allergies  Allergen Reactions   Sulfa Antibiotics Nausea Only    Patient Measurements: Height: 5\' 3"  (160 cm) Weight: 50.1 kg (110 lb 8 oz) IBW/kg (Calculated) : 52.4 Heparin Dosing Weight: TBW  Vital Signs: Temp: 98 F (36.7 C) (11/01 0505) Temp Source: Oral (11/01 0505) BP: 108/51 (11/01 0505) Pulse Rate: 97 (11/01 0505)  Labs: Recent Labs    10/19/21 0253 10/20/21 0156 10/21/21 0235  HGB 11.0* 11.1* 10.8*  HCT 33.9* 34.3* 32.8*  PLT 219 215 209  HEPARINUNFRC 0.40 0.45 0.63  CREATININE 2.10* 2.28* 2.45*     Estimated Creatinine Clearance: 12.8 mL/min (A) (by C-G formula based on SCr of 2.45 mg/dL (H)).  Medications:  Scheduled:   aspirin EC  81 mg Oral Daily   atorvastatin  20 mg Oral Daily   furosemide  80 mg Oral Daily   hydrALAZINE  25 mg Oral Q8H   mouth rinse  15 mL Mouth Rinse BID   multivitamin with minerals  1 tablet Oral Daily   sodium chloride flush  3 mL Intravenous Q12H   Infusions:   sodium chloride Stopped (10/10/21 1902)   heparin 850 Units/hr (10/21/21 0516)    Assessment: 58 yoF presented to ED with ShOB.  Heparin per pharmacy for ACS, hx PE/DVT on apixaban.  PTA Eliquis held, last dose 10/20 at 18:00.  Patient will remain on heparin until it is deemed that her pleural effusions are stable and will not require further thoracentesis and then restart Eliquis.  Heparin level remains therapeutic, CBC is stable, no S/Sx bleeding documented.  Goal of Therapy:  Heparin level 0.3-0.7 units/ml Monitor platelets by anticoagulation protocol: Yes   Plan:  Continue heparin 850 units/h Daily heparin level and CBC  [name], PharmD, [place], [facility] Clinical Pharmacist [phone] Please check AMION for all [place] numbers 10/21/2021

## 2021-10-21 NOTE — Progress Notes (Signed)
Occupational Therapy Treatment Patient Details Name: Patriciann Becht MRN: [id number] DOB: 23-May-1932 Today's Date: 10/21/2021   History of present illness 87 y.o. female admitted 10/10/21 with severe SOB; workup for acute hypoxic respiratory failure requiring bipap. CXR with large L effusion. S/p thoracentesis 10/21, repeat 10/23. [place] 10/24 showed normal CO, high filling pressures. PMH includes CHF (home O2 PRN), DVT/PE s/p rotator cuff repair (04/2021).   OT comments  Pt is slowly progressing towards OT goals. Pt remains limited by balance, weakness, and activity tolerance. During session, pt completed grooming tasks while standing at sink with supervision and functional transfers/mobility with Min guard. Pt's BP did drop during activity (see below), however pt did not report any symptoms and tolerated well. Pt remains appropriate for HHOT upon d/c. Will continue to follow acutely.   Recommendations for follow up therapy are one component of a multi-disciplinary discharge planning process, led by the attending physician.  Recommendations may be updated based on patient status, additional functional criteria and insurance authorization.    Follow Up Recommendations  Home health OT    Assistance Recommended at Discharge Intermittent Supervision/Assistance  Equipment Recommendations  Other (comment) (TBD - may need shower seat)    Recommendations for Other Services      Precautions / Restrictions Precautions Precautions: Fall Precaution Comments: monitor for orthostatics Restrictions Weight Bearing Restrictions: No       Mobility Bed Mobility Overal bed mobility: Needs Assistance Bed Mobility: Supine to Sit;Sit to Supine     Supine to sit: Min guard Sit to supine: Min guard       Transfers Overall transfer level: Needs assistance Equipment used: Rolling walker (2 wheels) Transfers: Sit to/from Stand Sit to Stand: Min guard                Balance Overall  balance assessment: Needs assistance Sitting-balance support: Feet supported Sitting balance-Leahy Scale: Good     Standing balance support: Bilateral upper extremity supported;During functional activity;Single extremity supported;No upper extremity supported Standing balance-Leahy Scale: Fair Standing balance comment: Able to brace against sink to maintain balance while performing grooming tasks                           ADL either performed or assessed with clinical judgement   ADL Overall ADL's : Needs assistance/impaired     Grooming: Supervision/safety;Standing;Wash/dry face;Oral care;Brushing hair Grooming Details (indicate cue type and reason): Standing at sink                             Functional mobility during ADLs: Min guard;Rolling walker (2 wheels) General ADL Comments: Limited by balance, weakness, and activity tolerance. Required seated rest at sink after grooming before returning to bed. Tolerated well overall with no reported symptoms of dizziness/lightheadedness.     Vision       Perception     Praxis      Cognition Arousal/Alertness: Awake/alert Behavior During Therapy: WFL for tasks assessed/performed Overall Cognitive Status: Within Functional Limits for tasks assessed                                            Exercises     Shoulder Instructions       General Comments Pt BP 111/65 in supine, 106/54 sitting EOB, and 85/43 when standing  at sink during grooming. Pt stated that she did not feel dizzy or lightheaded while standing. Pt BP 125/68 when she returned to supine in bed.    Pertinent Vitals/ Pain       Pain Assessment: No/denies pain Faces Pain Scale: No hurt  Home Living                                          Prior Functioning/Environment              Frequency  Min 2X/week        Progress Toward Goals  OT Goals(current goals can now be found in the care plan  section)  Progress towards OT goals: Progressing toward goals  Acute Rehab OT Goals Patient Stated Goal: return home OT Goal Formulation: With patient Time For Goal Achievement: 11/01/21 Potential to Achieve Goals: Good ADL Goals Pt Will Perform Grooming: with modified independence;standing Pt Will Perform Upper Body Dressing: with modified independence;sitting Pt Will Perform Lower Body Dressing: with modified independence;sit to/from stand Pt Will Transfer to Toilet: with modified independence;ambulating Pt Will Perform Toileting - Clothing Manipulation and hygiene: with modified independence;sit to/from stand Pt Will Perform Tub/Shower Transfer: Shower transfer;with modified independence;ambulating;shower seat  Plan Discharge plan remains appropriate;Frequency remains appropriate    Co-evaluation                 AM-PAC OT "6 Clicks" Daily Activity     Outcome Measure   Help from another person eating meals?: None Help from another person taking care of personal grooming?: A Little Help from another person toileting, which includes using toliet, bedpan, or urinal?: A Little Help from another person bathing (including washing, rinsing, drying)?: A Little Help from another person to put on and taking off regular upper body clothing?: None Help from another person to put on and taking off regular lower body clothing?: A Little 6 Click Score: 20    End of Session Equipment Utilized During Treatment: Rolling walker (2 wheels)  OT Visit Diagnosis: Unsteadiness on feet (R26.81);Muscle weakness (generalized) (M62.81);Pain;Low vision, both eyes (H54.2)   Activity Tolerance Patient limited by fatigue   Patient Left in bed;with call bell/phone within reach;with family/visitor present   Nurse Communication Mobility status        Time: 1541-1606 OT Time Calculation (min): 25 min  Charges: OT General Charges $OT Visit: 1 Visit OT Treatments $Self Care/Home Management :  23-37 mins  Elius Etheredge C, OT/L  Acute Rehab [place] 10/21/2021, 4:21 PM

## 2021-10-21 NOTE — Progress Notes (Addendum)
Physical Therapy Treatment Patient Details Name: Jillian Cooley MRN: [id number] DOB: [birth year] Today's Date: 10/21/2021   History of Present Illness 87 y.o. female admitted 10/10/21 with severe SOB; workup for acute hypoxic respiratory failure requiring bipap. CXR with large L effusion. S/p thoracentesis 10/21, repeat 10/23. Hendrix 10/24 showed normal CO, high filling pressures. PMH includes CHF (home O2 PRN), DVT/PE s/p rotator cuff repair (04/2021).    PT Comments    Pt was seen for review of BP readings while initiating mobility in room on walker.  Pt was assisted to supine and was measured as follows:    Supine 102/60 pulse 104 and sat 96%;   sitting 96/57, sat 94% and pulse 114;   standing 77/42, pulse 116 and sat 96%;  first gait 87/55, pulse 119, sat 95%;   second gait 80/55, pulse 120, sat 97% .    Pt is demonstrating some support of BP to walk short trips, but continues to have concerning values of BP with steady HR elevation to manage it.  Family in to observe and ask questions, and discussed her systemic response with HR and the role that support stockings would play.  Will continue to work on tolerances for mobility and increase as tolerated.  Pt was assisted to sitting posture to eat lunch, and will encourage OOB as planned in last visit.    Recommendations for follow up therapy are one component of a multi-disciplinary discharge planning process, led by the attending physician.  Recommendations may be updated based on patient status, additional functional criteria and insurance authorization.  Follow Up Recommendations  Home health PT     Assistance Recommended at Discharge Frequent or constant Supervision/Assistance  Equipment Recommendations  Rolling walker (2 wheels)    Recommendations for Other Services       Precautions / Restrictions Precautions Precautions: Fall Precaution Comments: monitor for orthostatics Restrictions Weight Bearing Restrictions: No      Mobility  Bed Mobility Overal bed mobility: Needs Assistance Bed Mobility: Supine to Sit;Sit to Supine     Supine to sit: Min guard Sit to supine: Min guard   General bed mobility comments: assisted with covers on bed and then pt could use rail and get in and out of bed alone    Transfers Overall transfer level: Needs assistance Equipment used: Rolling walker (2 wheels) Transfers: Sit to/from Stand Sit to Stand: Min guard           General transfer comment: min guard due to BP readings    Ambulation/Gait Ambulation/Gait assistance: Min guard Gait Distance (Feet): 60 Feet (30 x 2) Assistive device: Rolling walker (2 wheels);1 person hand held assist Gait Pattern/deviations: Step-through pattern;Decreased stride length;Narrow base of support Gait velocity: Decreased Gait velocity interpretation: <1.31 ft/sec, indicative of household ambulator General Gait Details: less instability on walker even away from side of bed   Stairs             Wheelchair Mobility    Modified Rankin (Stroke Patients Only)       Balance Overall balance assessment: Needs assistance Sitting-balance support: Feet supported Sitting balance-Leahy Scale: Good     Standing balance support: Bilateral upper extremity supported;During functional activity Standing balance-Leahy Scale: Fair Standing balance comment: less than fair dynamically                            Cognition Arousal/Alertness: Awake/alert Behavior During Therapy: WFL for tasks assessed/performed Overall Cognitive Status: Within Functional  Limits for tasks assessed                                          Exercises      General Comments General comments (skin integrity, edema, etc.): pt was assisted to supine and was as follow:  102/60 pulse 104 and sat 96%;  sitting 96/57, sat 94% and pulse 114;  standing 77/42, pulse 116 and sat 96%; first gait 87/55, pulse 119, sat 95%;  second  gait 80/55, pulse 120, sat 97%      Pertinent Vitals/Pain Pain Assessment: No/denies pain Faces Pain Scale: No hurt    Home Living                          Prior Function            PT Goals (current goals can now be found in the care plan section) Acute Rehab PT Goals Patient Stated Goal: go home Progress towards PT goals: Progressing toward goals    Frequency    Min 3X/week      PT Plan Current plan remains appropriate;Other (comment)    Co-evaluation              AM-PAC PT "6 Clicks" Mobility   Outcome Measure  Help needed turning from your back to your side while in a flat bed without using bedrails?: None Help needed moving from lying on your back to sitting on the side of a flat bed without using bedrails?: A Little Help needed moving to and from a bed to a chair (including a wheelchair)?: A Little Help needed standing up from a chair using your arms (e.g., wheelchair or bedside chair)?: A Little Help needed to walk in hospital room?: A Little Help needed climbing 3-5 steps with a railing? : A Little 6 Click Score: 19    End of Session Equipment Utilized During Treatment: Gait belt Activity Tolerance: Patient limited by fatigue;Treatment limited secondary to medical complications (Comment) Patient left: in bed;with call bell/phone within reach;with bed alarm set;with family/visitor present Nurse Communication: Mobility status;Other (comment) PT Visit Diagnosis: Other abnormalities of gait and mobility (R26.89);Muscle weakness (generalized) (M62.81)     Time: 4562-5638 PT Time Calculation (min) (ACUTE ONLY): 25 min  Charges:  $Gait Training: 8-22 mins $Therapeutic Activity: 8-22 mins          Ramond Dial 10/21/2021, 1:58 PM  Mee Hives, PT PhD Acute Rehab Dept. Number: [place] [facility] [place]

## 2021-10-22 ENCOUNTER — Ambulatory Visit: Payer: Medicare PPO | Admitting: Internal Medicine

## 2021-10-22 ENCOUNTER — Inpatient Hospital Stay (HOSPITAL_COMMUNITY): Payer: Medicare PPO

## 2021-10-22 DIAGNOSIS — N179 Acute kidney failure, unspecified: Secondary | ICD-10-CM | POA: Diagnosis not present

## 2021-10-22 DIAGNOSIS — I5033 Acute on chronic diastolic (congestive) heart failure: Secondary | ICD-10-CM | POA: Diagnosis not present

## 2021-10-22 DIAGNOSIS — I3139 Other pericardial effusion (noninflammatory): Secondary | ICD-10-CM

## 2021-10-22 DIAGNOSIS — J9621 Acute and chronic respiratory failure with hypoxia: Secondary | ICD-10-CM | POA: Diagnosis not present

## 2021-10-22 DIAGNOSIS — I455 Other specified heart block: Secondary | ICD-10-CM

## 2021-10-22 DIAGNOSIS — J90 Pleural effusion, not elsewhere classified: Secondary | ICD-10-CM | POA: Diagnosis not present

## 2021-10-22 LAB — CBC
HCT: 32.3 % — ABNORMAL LOW (ref 36.0–46.0)
Hemoglobin: 10.3 g/dL — ABNORMAL LOW (ref 12.0–15.0)
MCH: 29.5 pg (ref 26.0–34.0)
MCHC: 31.9 g/dL (ref 30.0–36.0)
MCV: 92.6 fL (ref 80.0–100.0)
Platelets: 200 10*3/uL (ref 150–400)
RBC: 3.49 MIL/uL — ABNORMAL LOW (ref 3.87–5.11)
RDW: 15.7 % — ABNORMAL HIGH (ref 11.5–15.5)
WBC: 9 10*3/uL (ref 4.0–10.5)
nRBC: 0 % (ref 0.0–0.2)

## 2021-10-22 LAB — BASIC METABOLIC PANEL
Anion gap: 10 (ref 5–15)
BUN: 65 mg/dL — ABNORMAL HIGH (ref 8–23)
CO2: 26 mmol/L (ref 22–32)
Calcium: 9.1 mg/dL (ref 8.9–10.3)
Chloride: 99 mmol/L (ref 98–111)
Creatinine, Ser: 2.42 mg/dL — ABNORMAL HIGH (ref 0.44–1.00)
GFR, Estimated: 19 mL/min — ABNORMAL LOW (ref >60)
Glucose, Bld: 100 mg/dL — ABNORMAL HIGH (ref 70–99)
Potassium: 4 mmol/L (ref 3.5–5.1)
Sodium: 135 mmol/L (ref 135–145)

## 2021-10-22 LAB — ECHOCARDIOGRAM LIMITED
Height: 63 in
S' Lateral: 2.1 cm
Weight: 1768 oz

## 2021-10-22 LAB — HEPARIN LEVEL (UNFRACTIONATED)
Heparin Unfractionated: 0.8 IU/mL — ABNORMAL HIGH (ref 0.30–0.70)
Heparin Unfractionated: 0.78 IU/mL — ABNORMAL HIGH (ref 0.30–0.70)
Heparin Unfractionated: <0.1 IU/mL — ABNORMAL LOW (ref 0.30–0.70)

## 2021-10-22 MED ORDER — BOOST / RESOURCE BREEZE PO LIQD CUSTOM
1.0000 | Freq: Three times a day (TID) | ORAL | Status: DC
  Administered 2021-10-22 – 2021-10-23 (×2): 1 via ORAL

## 2021-10-22 NOTE — Progress Notes (Signed)
ANTICOAGULATION CONSULT NOTE - Follow Up Consult  Pharmacy Consult for heparin Indication:  NSTEMI and h/o PE  Labs: Recent Labs    10/20/21 0156 10/21/21 0235 10/22/21 0325  HGB 11.1* 10.8* 10.3*  HCT 34.3* 32.8* 32.3*  PLT 215 209 200  HEPARINUNFRC 0.45 0.63 0.80*  CREATININE 2.28* 2.45* 2.42*    Assessment: 87yo female now supratherapeutic on heparin after several levels at goal though had been trending up; no infusion issues or signs of bleeding per RN.  Goal of Therapy:  Heparin level 0.3-0.7 units/ml   Plan:  Will decrease heparin infusion by 1-2 units/kg/hr to 750 units/hr and check level in 8 hours.    Wynona Neat, PharmD, BCPS  10/22/2021,4:42 AM

## 2021-10-22 NOTE — Progress Notes (Signed)
Nutrition Follow-up  DOCUMENTATION CODES:   Severe malnutrition in context of chronic illness  INTERVENTION:   Liberalize diet to regular. Add Boost Breeze po TID, each supplement provides 250 kcal and 9 grams of protein. Continue Magic cup TID with meals, each supplement provides 290 kcal and 9 grams of protein.  NUTRITION DIAGNOSIS:   Severe Malnutrition related to chronic illness as evidenced by severe fat depletion, severe muscle depletion.  Ongoing   GOAL:   Patient will meet greater than or equal to 90% of their needs  Progressing  MONITOR:   PO intake, Supplement acceptance, Labs, Weight trends  REASON FOR ASSESSMENT:   Consult Assessment of nutrition requirement/status  ASSESSMENT:   87 year old female with medical history of skin cancer, pleural effusion, HTN, mixed HLD, vitamin D deficiency, DDD, osteoarthritis, carpal tunnel syndrome, and stage 3-4 CKD. She was admitted due to chronic hypoxic respiratory failure d/t large recurrent L pleural effusion, hypotension, acute on chronic CHF, severe MR, pulmonary HTN, and NSTEMI. She underwent left thoracentesis on 10/23 with 1.5 L removed.  S/P thoracentesis 10/28. Remains on Lasix.  Meal intakes documented at 25-75% Remains on a heart healthy diet.  Patient reports that she has been eating poorly d/t decreased appetite. She has been eating bites of meals. She is receiving magic cups with meals, but only eats a few bites of it. She would like additional meal options. Discussed with attending physician, okay to liberalize diet to regular. Patient tried [employer] and liked it okay; will order TID between meals.    Labs reviewed.  Medications reviewed and include Lasix, MVI with minerals.  Admission weight 53.9 kg Weight 11/1 50.1 kg  Diet Order:   Diet Order             Diet regular Room service appropriate? Yes; Fluid consistency: Thin  Diet effective now                   EDUCATION  NEEDS:   Education needs have been addressed  Skin:  Skin Assessment: Skin Integrity Issues: Skin Integrity Issues:: Stage I Stage I: sacrum  Last BM:  11/2  Height:   Ht Readings from Last 1 Encounters:  10/17/21 5\' 3"  (1.6 m)    Weight:   Wt Readings from Last 1 Encounters:  10/21/21 50.1 kg    Ideal Body Weight:  52.3 kg  BMI:  Body mass index is 19.57 kg/m.  Estimated Nutritional Needs:   Kcal:  1500-1700 kcal  Protein:  75-85 grams  Fluid:  >/= 1.3 L/day   Jillian Cooley, RD, LDN, CNSC Please refer to Amion for contact information.

## 2021-10-22 NOTE — Progress Notes (Signed)
[facility] PROGRESS NOTE   Jillian Cooley TLX:[id number] DOB: 26-Sep-1932 DOA: 10/10/2021  PCP: Jillian Seashore, MD  Brief History/Interval Summary: 87 y.o. F with dCHF on home O2 PRN, recent Dx MM not yet started chemo, DVT and PE after rotator cuff surgery last May, on apixaban, as well as recurrent transudative pleural effusion (see below) who presented with slowly progressive SOB, finally severe.  Required BiPAP initially.  Was found to have a large left-sided pleural effusion.  Was admitted to the intensive care unit.  Underwent thoracentesis x2 during this hospital stay.  Seen by cardiology as well.  Underwent right heart catheterization.  Subsequently underwent TEE that showed moderate/severe mitral regurg.  Then underwent cardiac MRI that only moderate mitral regur .  Plan is to repeat echocardiogram today for pericardial effusion.  Monitor creatinine closely.  Reason for Visit: Pleural effusion  Consultants: Pulmonology.  Cardiology  Procedures:  Thoracentesis x2.   Right heart catheterization. Cardiac MRI   Subjective/Interval History: Met with pt and son at bedside. Introduced myself at attending. Subsequently, Dr. Gardiner Rhyme with cardiology also came into the room. We were able to interview pt together. Pt denies any CP, SOB. Still feel weak. Remains on IV heparin. Scr stable at 2.42.  pt has been on po lasix 80 mg daily for 3 days now.  Bedside thoracic U/S performed. Showed moderate size left sided effusion. Last thoracentesis was on 10-17-2021. Also had left thoracentesis on 10-17-2021.  Assessment/Plan:  Acute on chronic respiratory failure with hypoxia Secondary to CHF along with pleural effusion.  Uses oxygen as needed at home.  After thoracentesis she was able to come off of oxygen.  Doing well on room air.  Left-sided pleural effusion Initially diagnosed in February.  Seen by pulmonology.  Has undergone multiple thoracentesis including during this  hospital stay.  Patient underwent thoracentesis on 10/28.  Fluid analysis is consistent with a transudative process.  Thought to be secondary to cardiac process.   Chest x-ray was repeated on 10/29 and showed atelectasis.  No significant pleural effusion was noted.  Chest x-ray was repeated today by cardiology which shows an increase in the left-sided pleural effusion.  Changes suggestive of atelectasis versus pneumonitis noticed.  No history suggestive of infectious process.  Likely atelectasis.  Will prescribe incentive spirometer.   Patient has been seen by pulmonology in the outpatient setting and will need follow-up with them again soon.  With recurrent left sided thoracentesis seen today(10-22-2021), Cardiology and I discussed getting pulmonology involved again to consider chest tube with talc pleurodesis since her pleural effusion is recurrent. Cardiology does not think that pt's pleural effusion is cardiac in origin(I.e. not from mitral regurg). Will leave pt on IV heparin gtts until we can decide if talc pleurodesis is the right solution for this recurrent issue.  Acute on chronic diastolic CHF/NSTEMI/Mobitz type I AV block Has been on and off of diuretics during this hospital stay mainly driven by creatinine and symptoms.   Patient was transitioned to oral furosemide 80 mg twice a day.   Creatinine was noted to be higher yesterday at 2.28.  Dose of Lasix was decreased to 80 mg daily on 10-20-2021.  Creatinine noted to be 2.42.   Renal function seems stable at 2.4 Continue to monitor urine output.  Strict ins and outs and daily weight.   Patient was found to have elevated troponin levels though doubt acute coronary syndrome.   Patient is on aspirin and statin.  Not noted to be  on beta-blocker at this time.  Not on ACE inhibitor or ARB due to elevated creatinine.  Mitral regurgitation Cardiac MRI suggest moderate mitral regurgitation.  TEE had suggested severe mitral regurgitation.  Further  plans per cardiology. Cardiology does not think mitral regurg is the cause of pt's recurrent pleural effusion.  Acute kidney injury superimposed on chronic kidney disease stage IV/hyponatremia Baseline creatinine is around 1.8-2.1.  It was up to 2.7.  Had improved to 1.9.   Dose of Lasix was decreased to 80 mg daily on 10-20-2021.  Scr has stabilized at 2.4. May have to tolerate a higher level of creatinine in this patient with CHF requiring diuretics.  Sodium level has improved 134. Continue with lasix 80 mg daily.  History of bilateral pulmonary embolism diagnosed in May Was on apixaban.  Currently on IV heparin until all procedures have been completed.  Respiratory status is stable.  Hopefully transition to apixaban at the time of discharge.  Should not need aspirin at discharge.  Pulmonary artery hypertension Cardiology and pulmonology has been following.  Essential hypertension/orthostatic hypotension/hypokalemia Orthostatic drop in blood pressure noted when she worked with physical therapy yesterday.  Likely due to her multiple comorbidities and heart conditions along with the use of diuretics.  Patient was asked to get up slowly from sitting and lying position.  TED stockings may also help.  Remains on furosemide. Hydralazine stopped due to orthostatic hypotension on 10-21-2021.  Potassium is stable   History of multiple myeloma Followed by Jillian Cooley with oncology.  Has not been started on treatment yet due to her other comorbidities and acute issues.  Patient has an appointment with Jillian Cooley on 10/23/2021 at 2:40 PM.  This may need to be rescheduled if she is still here in the hospital.  Normocytic anemia Stable.  No evidence of overt bleeding.  Severe protein calorie malnutrition Nutrition Problem: Severe Malnutrition Etiology: chronic illness  Signs/Symptoms: severe fat depletion, severe muscle depletion  Interventions: Magic cup, MVI   DVT Prophylaxis: On IV heparin Code  Status: Full code Family Communication: Discussed with patient and her son Disposition Plan: Home health recommended by PT  Status is: Inpatient  Remains inpatient appropriate because: Need for procedures that cannot be done in the outpatient setting(possible chest tube and talc pleurodesis).     Medications: Scheduled:  aspirin EC  81 mg Oral Daily   atorvastatin  20 mg Oral Daily   furosemide  80 mg Oral Daily   mouth rinse  15 mL Mouth Rinse BID   multivitamin with minerals  1 tablet Oral Daily   sodium chloride flush  3 mL Intravenous Q12H   Continuous:  sodium chloride Stopped (10/10/21 1902)   heparin 750 Units/hr (10/22/21 0456)   RCV:ELFYBO chloride, acetaminophen, docusate sodium, ondansetron (ZOFRAN) IV, polyethylene glycol  Antibiotics: Anti-infectives (From admission, onward)    Start     Dose/Rate Route Frequency Ordered Stop   10/10/21 1900  cefTRIAXone (ROCEPHIN) 2 g in sodium chloride 0.9 % 100 mL IVPB  Status:  Discontinued        2 g 200 mL/hr over 30 Minutes Intravenous Every 24 hours 10/10/21 1457 10/11/21 1039   10/10/21 1900  azithromycin (ZITHROMAX) 500 mg in sodium chloride 0.9 % 250 mL IVPB  Status:  Discontinued        500 mg 250 mL/hr over 60 Minutes Intravenous Every 24 hours 10/10/21 1457 10/11/21 1015   10/10/21 1045  piperacillin-tazobactam (ZOSYN) IVPB 3.375 g  3.375 g 100 mL/hr over 30 Minutes Intravenous  Once 10/10/21 1034 10/10/21 1255   10/10/21 1030  piperacillin-tazobactam (ZOSYN) IVPB 2.25 g  Status:  Discontinued        2.25 g 100 mL/hr over 30 Minutes Intravenous  Once 10/10/21 1017 10/10/21 1034       Objective:  Vital Signs  Vitals:   10/21/21 2016 10/22/21 0001 10/22/21 0500 10/22/21 0742  BP: 112/64 (!) 105/59 103/67 (!) 100/56  Pulse: 97 (!) 103 87 99  Resp: $Remo'18 13 20 13  'PthOy$ Temp: 98.2 F (36.8 C) 97.9 F (36.6 C) 97.8 F (36.6 C) 98 F (36.7 C)  TempSrc: Oral Oral Oral Oral  SpO2: 96% 95% 96% 96%  Weight:       Height:        Intake/Output Summary (Last 24 hours) at 10/22/2021 0953 Last data filed at 10/22/2021 0300 Gross per 24 hour  Intake 769.97 ml  Output 500 ml  Net 269.97 ml   Filed Weights   10/18/21 0335 10/20/21 0411 10/21/21 0505  Weight: 52.6 kg 50.6 kg 50.1 kg   Physical Exam Vitals and nursing note reviewed.  Constitutional:      General: She is not in acute distress.    Appearance: She is normal weight. She is not toxic-appearing or diaphoretic.     Comments: Appears chronically ill  HENT:     Head: Normocephalic and atraumatic.     Nose: Nose normal. No rhinorrhea.  Eyes:     Pupils: Pupils are equal, round, and reactive to light.  Cardiovascular:     Rate and Rhythm: Normal rate and regular rhythm.     Pulses: Normal pulses.     Heart sounds: Murmur heard.  Pulmonary:     Breath sounds: Examination of the left-middle field reveals decreased breath sounds. Examination of the left-lower field reveals decreased breath sounds. Decreased breath sounds present. No wheezing or rhonchi.     Comments: Bedside thoracic U/S showed moderate sized left sided effusion. Estimated at least 500 ml in volume  Right side with minimal effusion Abdominal:     General: Abdomen is flat. Bowel sounds are normal. There is no distension.     Tenderness: There is no abdominal tenderness. There is no rebound.  Musculoskeletal:     Right lower leg: No edema.     Left lower leg: No edema.  Skin:    Capillary Refill: Capillary refill takes less than 2 seconds.  Neurological:     General: No focal deficit present.     Mental Status: She is alert and oriented to person, place, and time.     Lab Results:  Data Reviewed: I have personally reviewed following labs and imaging studies  CBC: Recent Labs  Lab 10/18/21 0152 10/19/21 0253 10/20/21 0156 10/21/21 0235 10/22/21 0325  WBC 9.8 8.8 9.7 7.7 9.0  HGB 11.3* 11.0* 11.1* 10.8* 10.3*  HCT 35.0* 33.9* 34.3* 32.8* 32.3*  MCV 91.6  92.1 91.5 90.9 92.6  PLT 195 219 215 209 034    Basic Metabolic Panel: Recent Labs  Lab 10/18/21 0152 10/19/21 0253 10/20/21 0156 10/21/21 0235 10/22/21 0325  NA 136 133* 132* 134* 135  K 5.0 4.1 4.2 4.1 4.0  CL 104 101 97* 98 99  CO2 $Re'25 24 25 26 26  'INx$ GLUCOSE 121* 111* 107* 107* 100*  BUN 61* 65* 68* 66* 65*  CREATININE 2.00* 2.10* 2.28* 2.45* 2.42*  CALCIUM 8.9 8.8* 9.1 9.2 9.1  MG 2.1  --   --   --   --  GFR: Estimated Creatinine Clearance: 13 mL/min (A) (by C-G formula based on SCr of 2.42 mg/dL (H)).    No results found for this or any previous visit (from the past 240 hour(s)).      Radiology Studies: DG CHEST PORT 1 VIEW  Result Date: 10/21/2021 CLINICAL DATA:  Shortness of breath, pleural effusion EXAM: PORTABLE CHEST 1 VIEW COMPARISON:  Previous studies including the examination of 10/18/2021 FINDINGS: Transverse diameter of heart is increased. Central pulmonary vessels are prominent. Bilateral pleural effusions are seen, more so on the left side. There is increase in amount of left pleural effusion. Increased markings are seen in both lower lung fields suggesting underlying atelectasis/pneumonia. There is no pneumothorax. IMPRESSION: Cardiomegaly. Bilateral pleural effusions. There is interval increase in amount of left pleural effusion. Increased markings are seen in both lower lung fields suggesting atelectasis/pneumonitis. Electronically Signed   By: Elmer Picker M.D.   On: 10/21/2021 10:47   MR CARDIAC MORPHOLOGY WO CONTRAST  Result Date: 10/20/2021 CLINICAL DATA:  Evaluate mitral regurgitation EXAM: CARDIAC MRI TECHNIQUE: The patient was scanned on a 1.5 Tesla Siemens magnet. A dedicated cardiac coil was used. Functional imaging was done using Fiesta sequences. 2,3, and 4 chamber views were done to assess for RWMA's. Modified Simpson's rule using a short axis stack was used to calculate an ejection fraction on a dedicated work Doctor, hospital. The patient did not receive Gadavist. CONTRAST:  No contrast administered FINDINGS: Left ventricle: -Asymmetric hypertrophy measuring 40mm in basal septum, 35mm in posterior wall -T1 elevated at 1144ms -Normal size -Normal systolic function LV EF:  63% (Normal 56-78%) Absolute volumes: LV EDV: 76mL (Normal 52-141 mL) LV ESV: 37mL (Normal 13-51 mL) LV SV: 55mL (Normal 33-97 mL) CO: 4.0L/min (Normal 2.7-6.0 L/min) Indexed volumes: LV EDV: 25mL/sq-m (Normal 41-81 mL/sq-m) LV ESV: 71mL/sq-m (Normal 12-21 mL/sq-m) LV SV: 62mL/sq-m (Normal 26-56 mL/sq-m) CI: 2.7L/min/sq-m (Normal 1.8-3.8 L/min/sq-m) Right ventricle: Normal size and systolic function RV EF: 19% (Normal 47-80%) Absolute volumes: RV EDV: 36mL (Normal 58-154 mL) RV ESV: 37mL (Normal 12-68 mL) RV SV: 34mL (Normal 35-98 mL) CO: 3.8L/min (Normal 2.7-6 L/min) Indexed volumes: RV EDV: 48mL/sq-m (Normal 48-87 mL/sq-m) RV ESV: 93mL/sq-m (Normal 11-28 mL/sq-m) RV SV: 61mL/sq-m (Normal 27-57 mL/sq-m) CI: 2.5L/min/sq-m (Normal 1.8-3.8 L/min/sq-m) Left atrium: Normal size Right atrium: Normal size Mitral valve: Moderate regurgitation (regurgitant fraction 21%) Aortic valve: Mild to moderate regurgitation (regurgitant fraction 18%) Tricuspid valve: Moderate regurgitation (regurgitant fraction 28%) Pulmonic valve: No regurgitation Aorta: Normal proximal ascending aorta Pericardium: Small to moderate effusion measuring 15mm adjacent to LV inferior wall Extracardiac structures: Moderate bilateral pleural effusions IMPRESSION: 1. Moderate asymmetric LV hypertrophy measuring 101mm in basal septum (10mm in posterior wall) 2. Elevated myocardial native T1 values (1168ms), which could be consistent with amyloidosis, though contrast was not administered due to renal function which reduces specificity of T1 findings, as unable to calculate ECV or assess for LGE to confirm amyloidosis 3.  Normal LV size and systolic function (EF 62%) 4.  Normal RV size and systolic function  (EF 22%) 5.  Moderate mitral regurgitation (regurgitant fraction 21%) 6.  Moderate tricuspid regurgitation (regurgitant fraction 28%) 7.  Mild to moderate aortic regurgitation (regurgitant fraction 18%) 8. Small to moderate pericardial effusion measuring 52mm adjacent to LV inferior wall 9.  Moderate bilateral pleural effusions Electronically Signed   By: Oswaldo Milian M.D.   On: 10/20/2021 21:56       LOS: 12 days   Kristopher Oppenheim  [facility]  Pager on [email or web address]  10/22/2021, 9:53 AM

## 2021-10-22 NOTE — Progress Notes (Signed)
[place] for Heparin (apixaban PTA) Indication: NSTEMI and PE  Allergies  Allergen Reactions   Sulfa Antibiotics Nausea Only    Patient Measurements: Height: 5\' 3"  (160 cm) Weight: 50.1 kg (110 lb 8 oz) IBW/kg (Calculated) : 52.4 Heparin Dosing Weight: TBW  Vital Signs: Temp: 98 F (36.7 C) (11/02 1248) Temp Source: Oral (11/02 1248) BP: 95/65 (11/02 1248) Pulse Rate: 108 (11/02 1248)  Labs: Recent Labs    10/20/21 0156 10/21/21 0235 10/22/21 0325 10/22/21 1314  HGB 11.1* 10.8* 10.3*  --   HCT 34.3* 32.8* 32.3*  --   PLT 215 209 200  --   HEPARINUNFRC 0.45 0.63 0.80* 0.78*  CREATININE 2.28* 2.45* 2.42*  --      Estimated Creatinine Clearance: 13 mL/min (A) (by C-G formula based on SCr of 2.42 mg/dL (H)).  Medications:  Scheduled:   aspirin EC  81 mg Oral Daily   atorvastatin  20 mg Oral Daily   feeding supplement  1 Container Oral TID BM   furosemide  80 mg Oral Daily   mouth rinse  15 mL Mouth Rinse BID   multivitamin with minerals  1 tablet Oral Daily   sodium chloride flush  3 mL Intravenous Q12H   Infusions:   sodium chloride Stopped (10/10/21 1902)   heparin 750 Units/hr (10/22/21 0456)    Assessment: 66 yoF presented to ED with ShOB.  Heparin per pharmacy for ACS, hx PE/DVT on apixaban.  PTA Eliquis held, last dose 10/20 at 18:00.  Patient will remain on heparin until it is deemed that her pleural effusions are stable and will not require further thoracentesis and then restart Eliquis.  Heparin level remains slightly elevated.  Goal of Therapy:  Heparin level 0.3-0.7 units/ml Monitor platelets by anticoagulation protocol: Yes   Plan:  Reduce heparin to 650 units/h Repeat heparin level in 8h   [name], PharmD, [place], [facility] Clinical Pharmacist [phone] Please check AMION for all [place] numbers 10/22/2021

## 2021-10-22 NOTE — Progress Notes (Signed)
Progress Note  Patient Name: Jillian Cooley Date of Encounter: 10/22/2021  [facility] Cardiologist: Werner Lean, MD   Subjective   Incomplete I/Os yesterday, recorded as net positive to 70 cc, -2.6 L on admission.  BP 100/56 this morning.  No weight recorded today.  Sodium has normalized (135) and creatinine stable (2.5 > 2.4).  Reports dyspnea has improved.  Remains on room air.   Inpatient Medications    Scheduled Meds:  aspirin EC  81 mg Oral Daily   atorvastatin  20 mg Oral Daily   furosemide  80 mg Oral Daily   mouth rinse  15 mL Mouth Rinse BID   multivitamin with minerals  1 tablet Oral Daily   sodium chloride flush  3 mL Intravenous Q12H   Continuous Infusions:  sodium chloride Stopped (10/10/21 1902)   heparin 750 Units/hr (10/22/21 0456)   PRN Meds: sodium chloride, acetaminophen, docusate sodium, ondansetron (ZOFRAN) IV, polyethylene glycol   Vital Signs    Vitals:   10/21/21 2016 10/22/21 0001 10/22/21 0500 10/22/21 0742  BP: 112/64 (!) 105/59 103/67 (!) 100/56  Pulse: 97 (!) 103 87 99  Resp: $Remo'18 13 20 13  'bbTou$ Temp: 98.2 F (36.8 C) 97.9 F (36.6 C) 97.8 F (36.6 C) 98 F (36.7 C)  TempSrc: Oral Oral Oral Oral  SpO2: 96% 95% 96% 96%  Weight:      Height:        Intake/Output Summary (Last 24 hours) at 10/22/2021 0829 Last data filed at 10/22/2021 0300 Gross per 24 hour  Intake 769.97 ml  Output 500 ml  Net 269.97 ml    Last 3 Weights 10/21/2021 10/20/2021 10/18/2021  Weight (lbs) 110 lb 8 oz 111 lb 8 oz 115 lb 15.4 oz  Weight (kg) 50.122 kg 50.576 kg 52.6 kg      Telemetry    Normal sinus rhythm with PVCs- Personally Reviewed  ECG    No new EKG to review- Personally Reviewed  Physical Exam  GEN: Thin and frail appearing HEENT: Normal NECK: No JVD LYMPHATICS: No lymphadenopathy CARDIAC:RRR, no murmurs, rubs, gallops RESPIRATORY:  diminished at left base ABDOMEN: Soft, non-tender, non-distended MUSCULOSKELETAL:  No  edema; No deformity  SKIN: Warm and dry NEUROLOGIC:  Alert and oriented x 3 PSYCHIATRIC:  Normal affect   Labs    High Sensitivity Troponin:   Recent Labs  Lab 10/10/21 0731 10/10/21 0935 10/10/21 1548 10/10/21 1822 10/12/21 0252  TROPONINIHS 523* 836* 3,307* 5,378* 5,247*      Chemistry Recent Labs  Lab 10/18/21 0152 10/19/21 0253 10/20/21 0156 10/21/21 0235 10/22/21 0325  NA 136   < > 132* 134* 135  K 5.0   < > 4.2 4.1 4.0  CL 104   < > 97* 98 99  CO2 25   < > $R'25 26 26  'eX$ GLUCOSE 121*   < > 107* 107* 100*  BUN 61*   < > 68* 66* 65*  CREATININE 2.00*   < > 2.28* 2.45* 2.42*  CALCIUM 8.9   < > 9.1 9.2 9.1  MG 2.1  --   --   --   --   GFRNONAA 24*   < > 20* 19* 19*  ANIONGAP 7   < > $R'10 10 10   'nu$ < > = values in this interval not displayed.     Lipids  No results for input(s): CHOL, TRIG, HDL, LABVLDL, LDLCALC, CHOLHDL in the last 168 hours.   Hematology Recent Labs  Lab 10/20/21 0156 10/21/21 0235 10/22/21 0325  WBC 9.7 7.7 9.0  RBC 3.75* 3.61* 3.49*  HGB 11.1* 10.8* 10.3*  HCT 34.3* 32.8* 32.3*  MCV 91.5 90.9 92.6  MCH 29.6 29.9 29.5  MCHC 32.4 32.9 31.9  RDW 15.3 15.5 15.7*  PLT 215 209 200    Thyroid No results for input(s): TSH, FREET4 in the last 168 hours.  BNP Recent Labs  Lab 10/16/21 0252  BNP 1,121.0*     DDimer No results for input(s): DDIMER in the last 168 hours.   Radiology    DG CHEST PORT 1 VIEW  Result Date: 10/21/2021 CLINICAL DATA:  Shortness of breath, pleural effusion EXAM: PORTABLE CHEST 1 VIEW COMPARISON:  Previous studies including the examination of 10/18/2021 FINDINGS: Transverse diameter of heart is increased. Central pulmonary vessels are prominent. Bilateral pleural effusions are seen, more so on the left side. There is increase in amount of left pleural effusion. Increased markings are seen in both lower lung fields suggesting underlying atelectasis/pneumonia. There is no pneumothorax. IMPRESSION: Cardiomegaly.  Bilateral pleural effusions. There is interval increase in amount of left pleural effusion. Increased markings are seen in both lower lung fields suggesting atelectasis/pneumonitis. Electronically Signed   By: Elmer Picker M.D.   On: 10/21/2021 10:47   MR CARDIAC MORPHOLOGY WO CONTRAST  Result Date: 10/20/2021 CLINICAL DATA:  Evaluate mitral regurgitation EXAM: CARDIAC MRI TECHNIQUE: The patient was scanned on a 1.5 Tesla Siemens magnet. A dedicated cardiac coil was used. Functional imaging was done using Fiesta sequences. 2,3, and 4 chamber views were done to assess for RWMA's. Modified Simpson's rule using a short axis stack was used to calculate an ejection fraction on a dedicated work Conservation officer, nature. The patient did not receive Gadavist. CONTRAST:  No contrast administered FINDINGS: Left ventricle: -Asymmetric hypertrophy measuring 71mm in basal septum, 33mm in posterior wall -T1 elevated at 1150ms -Normal size -Normal systolic function LV EF:  63% (Normal 56-78%) Absolute volumes: LV EDV: 69mL (Normal 52-141 mL) LV ESV: 54mL (Normal 13-51 mL) LV SV: 56mL (Normal 33-97 mL) CO: 4.0L/min (Normal 2.7-6.0 L/min) Indexed volumes: LV EDV: 9mL/sq-m (Normal 41-81 mL/sq-m) LV ESV: 70mL/sq-m (Normal 12-21 mL/sq-m) LV SV: 49mL/sq-m (Normal 26-56 mL/sq-m) CI: 2.7L/min/sq-m (Normal 1.8-3.8 L/min/sq-m) Right ventricle: Normal size and systolic function RV EF: 38% (Normal 47-80%) Absolute volumes: RV EDV: 6mL (Normal 58-154 mL) RV ESV: 54mL (Normal 12-68 mL) RV SV: 40mL (Normal 35-98 mL) CO: 3.8L/min (Normal 2.7-6 L/min) Indexed volumes: RV EDV: 90mL/sq-m (Normal 48-87 mL/sq-m) RV ESV: 36mL/sq-m (Normal 11-28 mL/sq-m) RV SV: 74mL/sq-m (Normal 27-57 mL/sq-m) CI: 2.5L/min/sq-m (Normal 1.8-3.8 L/min/sq-m) Left atrium: Normal size Right atrium: Normal size Mitral valve: Moderate regurgitation (regurgitant fraction 21%) Aortic valve: Mild to moderate regurgitation (regurgitant fraction 18%) Tricuspid  valve: Moderate regurgitation (regurgitant fraction 28%) Pulmonic valve: No regurgitation Aorta: Normal proximal ascending aorta Pericardium: Small to moderate effusion measuring 80mm adjacent to LV inferior wall Extracardiac structures: Moderate bilateral pleural effusions IMPRESSION: 1. Moderate asymmetric LV hypertrophy measuring 43mm in basal septum (66mm in posterior wall) 2. Elevated myocardial native T1 values (1158ms), which could be consistent with amyloidosis, though contrast was not administered due to renal function which reduces specificity of T1 findings, as unable to calculate ECV or assess for LGE to confirm amyloidosis 3.  Normal LV size and systolic function (EF 45%) 4.  Normal RV size and systolic function (EF 36%) 5.  Moderate mitral regurgitation (regurgitant fraction 21%) 6.  Moderate tricuspid regurgitation (regurgitant fraction 28%) 7.  Mild to moderate aortic regurgitation (regurgitant fraction 18%) 8. Small to moderate pericardial effusion measuring 9mm adjacent to LV inferior wall 9.  Moderate bilateral pleural effusions Electronically Signed   By: Oswaldo Milian M.D.   On: 10/20/2021 21:56    Cardiac Studies   Echocardiogram 10/10/2021      1. Normal LV function; mild AI; severe MR; elevated mean gradient across  MV of 5 mmHg likely at least partially related to severe MR; moderate TR.   2. Left ventricular ejection fraction, by estimation, is 60 to 65%. The  left ventricle has normal function. The left ventricle has no regional  wall motion abnormalities. Left ventricular diastolic parameters are  indeterminate.   3. Right ventricular systolic function is normal. The right ventricular  size is normal. There is moderately elevated pulmonary artery systolic  pressure.   4. Left atrial size was moderately dilated.   5. Large pleural effusion in the left lateral region.   6. The mitral valve is abnormal. Severe mitral valve regurgitation. Mild  mitral stenosis.    7. Tricuspid valve regurgitation is moderate.   8. The aortic valve has an indeterminant number of cusps. Aortic valve  regurgitation is mild. No aortic stenosis is present.   9. The inferior vena cava is normal in size with <50% respiratory  variability, suggesting right atrial pressure of 8 mmHg.        Right heart catheterization 10/14/2021   Conclusion   1. Right and left heart filling pressures are elevated.  2. Prominent v-wave suggestive of significant mitral regurgitation.  3. Cardiac output is normal.  4. Mixed pulmonary venous/pulmonary arterial HTN   Right Heart   Right Heart Pressures RHC Procedural Findings: Hemodynamics (mmHg) RA mean 11 RV 66/9 PA 74/29, mean 49 PCWP mean 25, prominent V-waves to 37  Oxygen saturations: PA 62% AO 100%  Cardiac Output (Fick) 4.1  Cardiac Index (Fick) 2.6 PVR 5.8 WU      Patient Profile     87 y.o. female with multiple myeloma, HTN, HLD, valvular heart disease (at least moderate, probably severe MR), CKD stage 4, carpal tunnel, bilateral DVT/PE, pleural effusion s/p repeated thoracentesis admitted 10/21 with hypoxic respiratory failure and hypotension, lactic acidosis, elevated troponin, treated with IVF and stress dose steroids. She underwent L sided thoracentesis on 10/21 (1.5L) and repeat L sided thoracentesis on 10/23 (1.2L). Cytology -ve for malignancy.  Right heart catheterization performed 10/13/2021 showed mixed findings of acute left heart failure and intrinsic pulmonary artery hypertension (consider secondary to pulm embolism).  Transferred to [facility] for TEE to assess if MitraClip is an option, possibly involve HF team if not.  Assessment & Plan    Acute diastolic heart failure: Echo 10/10/2021 with normal LV function, mild AI, severe MR, moderate TR.  [place] 10/13/2021 with RA 11, RV 66/9, PA 74/29/49, PCWP 25 with prominent V waves to 37, PA sat 62%, CI 2.6.  TEE 10/28 with thickened mitral valve with restricted leaflet  motion and can measure all mall coaptation, severe MR, mild MS, EF 60 to 65%, mild RV dysfunction, mild AI. -On review of the TEE, I think the MR appears more moderate, 3D VCA totaled 0.19cm^2 for the 2 jets.  In addition there is no murmur on exam. Given discordant parameters on TEE, recommended cardiac MRI to quantify MR severity, which showed moderate MR (regurgitant fraction 21%)   -Given her multiple myeloma, cardiac MRI was also done to evaluate for evidence of amyloidosis.  Cardiac MRI does  show some features of amyloid (LVH, elevated native T1) though nonspecific findings as unable to give contrast due to renal function, so not able to evaluate ECV or LGE to confirm amyloidosis  -Was started on hydralazine, held due to soft BP -Have avoided ACE/ARB given renal dysfunction -Creatinine trending up and worsening hyponatremia, decreased Lasix to 80 mg daily 10/31, renal function has stabilized  Mitral insufficiency: Described as moderate on outside echocardiogram performed in February (unable to review images), moderate (but possibly underestimated) by echo in May.  TEE concerning for severe MR though there were discordant parameters as above (3D VCA 0.19cm^2); cardiac MRI shows moderate MR (regurgitant fraction 21%)  PAH: Mixed etiology due to diastolic heart failure and intrinsic pulmonary arterial disease (possibly sequelae of previous pulmonary embolism).  Note that she had evidence of moderate to severe pulmonary hypertension by echocardiogram performed in February. -Continue diuretics with Lasix 80 mg daily  Acute on chronic respiratory failure with hypoxia: Improved with thoracentesis and diuresis, but pleural effusion re-accumulated fast.  -Chest x-ray 10/29 showed slightly increased atelectasis versus airspace disease in the left lower lung and little significant change in bilateral effusions with right lower lung consolidation/atelectasis.  Chest x-ray 11/1 with bilateral pleural effusions  with interval increase in left pleural effusion.  Would discuss with pulmonology, may need to consider pleurodesis as effusion keeps recurring -Continue p.o. Lasix 80 mg daily  NSTEMI: She does not have any regional wall motion abnormalities on the echocardiogram, but it is quite possible that she has underlying CAD based on the degree of increase in cardiac enzymes.   -She is not a candidate for invasive angiography and percutaneous revascularization or surgery.   -It is quite reasonable to treat her with aggressive lipid-lowering therapy.   -Continue aspirin 81 mg daily but this should be stopped when she restarts treatment with Eliquis for her pulmonary embolism  PE: Although this occurred in the setting of postoperative state, it is very likely that she has a hypercoagulable condition due to the hematological disorder.   -Currently on IV heparin until it is deemed that her pleural effusions are stable and will require no further thoracentesis and then restart Eliquis -Probably will remain on lifelong anticoagulation.  Sinus pauses: No significant bradycardia on telemetry and mostly blocked PACs  AKI on Chronic kidney disease stage IV: Creatinine stable at 2.4 today  Multiple myeloma: to discuss plans to resume therapy w her Oncologist.  Pericardial effusion: small to moderate on CMR 10/31, did not have effusion on prior echo. Will plan limited echo today for further evaluation     For questions or updates, please contact [place] Please consult [email or web address] for contact info under        Signed, Donato Heinz, MD  10/22/2021, 8:29 AM

## 2021-10-22 NOTE — Progress Notes (Signed)
NAME:  Jillian Cooley, MRN:  [id number], DOB:  27-Aug-1932, LOS: 12 ADMISSION DATE:  10/10/2021, CONSULTATION DATE:  10/10/2021 REFERRING MD:  [place]: Respiratory failure, large left pleural effusion, metabolic acidosis   History of Present Illness:  87 year old woman who presented to [facility] ED 10/21 for SOB/dyspnea and hypoxia. PMHx significant for HTN, HLD, NSTEMI (2022), severe MR, pulmonary HTN, acute-on-chronic diastolic HF, DVT/PE (05/7892, on Eliquis), recurrent L pleural effusion, acute-on-chronic hypoxic respiratory failure, multiple myeloma, CKD stage IV.  Patient was scheduled for chemotherapy 10/21 but was found to be hypoxic to 80s (per husband) despite 4L [place]. She required BiPAP. Blood pressures were soft and labs revealed metabolic acidosis with pH 7.1 and LA 4.0. Additionally, patient was noted to have mild AKI on CKD and elevated BNP to 1000. Troponins peaked at 5300, c/f NSTEMI (type II). CXR demonstrated large L pleural effusion.  PCCM called for assistance with management of critical illness and recurrent transudative L pleural effusion.  Pertinent Medical History:   Past Medical History:  Diagnosis Date   Abnormal EKG    Carpal tunnel syndrome    CKD (chronic kidney disease), stage III (HCC)    DDD (degenerative disc disease), lumbar    Hypertension    Hypertension with renal disease    Menopause    Mixed hyperlipidemia    Muscular degeneration    Nonrheumatic aortic (valve) insufficiency    Nonrheumatic mitral valve regurgitation    Osteoarthritis    Pedal edema    Pleural effusion    Skin cancer    Vitamin D deficiency    Significant Hospital Events: Including procedures, antibiotic start and stop dates in addition to other pertinent events   10/21 - Admitted via [facility] ED. On BiPAP. Soft BP, LA 4 with pH 7.1, s/p 1.5L thoracentesis (transudate with LDH 64, cyto pending). SDS, fluids for soft BPs.  IV heparin for NSTEMI. 10/22 - seen by  cardiology.  Demand ischemia suspected troponin peaked at 5000.  Currently on 4 L oxygen and off BiPAP.  Feeling better after thoracentesis.  Chest x-ray with reduced but residual left pleural effusion.  Currently on bicarbonate infusion and heparin infusion.  Not on pressors. AFebirle an cultur enegative. BP improved 100-119 sbp.,. and dias 50s. 10/24 - RHC today, elevated R and L heart filling pressures, significant MR, normal CO, mixed pulmonary venous/pulmonary arterial HTN.  Interim History / Subjective:  Reports feeling good.  Currently on room air without distress  Objective:  Blood pressure (!) 100/56, pulse 99, temperature 98 F (36.7 C), temperature source Oral, resp. rate 13, height $RemoveBe'5\' 3"'dWEblRDpS$  (1.6 m), weight 50.1 kg, SpO2 96 %.      Estimated body mass index is 19.57 kg/m as calculated from the following:   Height as of this encounter: $RemoveBeforeD'5\' 3"'VvdHwzvQrLnkhW$  (1.6 m).   Weight as of this encounter: 50.1 kg.   Intake/Output Summary (Last 24 hours) at 10/22/2021 1112 Last data filed at 10/22/2021 0300 Gross per 24 hour  Intake 409.97 ml  Output 300 ml  Net 109.97 ml   Filed Weights   10/18/21 0335 10/20/21 0411 10/21/21 0505  Weight: 52.6 kg 50.6 kg 50.1 kg   Physical Examination: General: Well-nourished well-developed female no acute distress HEENT: No JVD or lymphadenopathy is appreciated Neuro: Grossly intact without focal defect CV: Heart sounds are distant PULM: To the bases currently on room air with O2 sats of 94%  GI: soft, bsx4 active  GU: Extremities: warm/dry,  edema  Skin: no rashes or lesions    Resolved Hospital Problem List:   Severe lactic acidosis and metabolic acidosis at admission.?  Due to cardiac versus renal injury for myeloma versus sepsis  Assessment & Plan:   Chronic recurrent left pleural effusion  Transudative (February 2022, May 2022, September 2022) S/p 1.5L [place] with dyspnea improvement (transudate). Cytology with lymphoid cells / reactive..  Note  substantial left pleural effusion is noted via ultrasound on 10/22/2021.  Chest x-ray is not impressive.  Currently on room air without respiratory distress Discussion between pulmonary and cardiac and oncology concerning treatment path. Family would be agreeable to 1 more thoracentesis possible Pleurx tube placement and/or talc pleurodesis if this proves to be a cancerous process they understand that with multiple procedures there is greater chance of complication. Serial chest x-ray If Thora done will attempt cytology Diuresis as tolerated She has a follow-up appointment with Dr. Silas Flood Sorbid schedule    Acute-on-chronic hypoxemic respiratory failure Currently on room air No respiratory distress at rest  History of DVT/PE in May 2022 Anticoagulation per cardiology   Acute-on-chronic diastolic dysfunction and severe mitral regurgitation with severe elevated pulmonary artery pressures and left atrial dilatation Grade 2 diastolic dysfunction with severe elevation pulmonary artery pressures - May 2022 Type II NSTEMI Demand ischemia per Cards. Echo on admission with severe increased PASP and severe MR. Peak troponins 5300.    Appreciate cardiology input Anticoagulation per cardiology    Acute kidney injury on CKD IV Renal US without evidence of hydronephrosis. Lab Results  Component Value Date   CREATININE 2.42 (H) 10/22/2021   CREATININE 2.45 (H) 10/21/2021   CREATININE 2.28 (H) 10/20/2021   CREATININE 2.38 (H) 09/30/2021   CREATININE 2.29 (H) 09/16/2021   CREATININE 1.79 (H) 06/17/2021   Consider renal consult  Multiple myeloma  Untreated. Due to start chemotherapy 10/10/2021.  Sees [name]. Per [facility] hospitalist and oncology  Failure to thrive - Prior to & Present on Admit (admit to weight loss prior to admit and low appetite x 1 mo) Mild protein calorie malnutrition - Prior to & Present on Admit -encourage nutrition   GOC Extensive discussion with patient,  son & daughter regarding plan of care.  Discussed how complex her medical needs currently are to include severe MR, CKD, PE, Pulmonary HTN, CHF, recurrent pleural effusion and multiple myeloma. We discussed how each impacts the other organ systems.  We reviewed "best case" scenario of shooting for a euvolemic state, focusing on PT/nutrition and getting her to the [place] office for evaluation for chemotherapy.  We discussed concepts such as impacts of therapy on her chronic disease states.  We reviewed what was most important to her - currently, she hopes to go home.  We also discussed that she is in charge and if ever she feels the impact of healthcare interventions are too great, she always has the ability to stop.  She understands that given her complexity of issues that chemotherapy may not be an achievable goal.  10/22/2021 family again updated as far as goals of care if this thoracentesis done will be the 6 thoracentesis.  Currently she is in no acute distress.  The son was at the bedside and they both understood the concept of palliative care.  At this time it appears she would want another thoracentesis hopefully with a diagnosis.  This is a cancer then talc pleurodesis would be an option.   -consider palliative care consultation for home needs .  Best Practice (right click  and "Reselect all SmartList Selections" daily)  Diet/type: NPO w/ oral meds DVT prophylaxis: systemic heparin GI prophylaxis: H2B Lines: N/A Foley:  N/A Code Status:  full code   Richardson Landry Tanajah Boulter ACNP Acute Care Nurse Practitioner [place] Please consult [place] 10/22/2021, 11:12 AM

## 2021-10-22 NOTE — Progress Notes (Signed)
Mobility Specialist Progress Note    10/22/21 1050  Mobility  Activity Ambulated in hall  Level of Assistance Standby assist, set-up cues, supervision of patient - no hands on  Assistive Device Front wheel walker  Distance Ambulated (ft) 72 ft  Mobility Ambulated with assistance in hallway  Mobility Response Tolerated well  Mobility performed by Mobility specialist  Bed Position Chair  $Mobility charge 1 Mobility   Pre-Mobility: 87 HR, 103/53 BP, 98% SpO2  Pt received in bed and agreeable. Had successful void on BSC. No complaints on walk. Moved smoothly without stopping. Returned to chair with son and MD present.   Hildred Alamin Mobility Specialist  Mobility Specialist Phone: [phone]

## 2021-10-22 NOTE — Plan of Care (Signed)

## 2021-10-23 ENCOUNTER — Inpatient Hospital Stay: Payer: Medicare PPO

## 2021-10-23 ENCOUNTER — Encounter (HOSPITAL_COMMUNITY): Payer: Self-pay | Admitting: Internal Medicine

## 2021-10-23 ENCOUNTER — Encounter (HOSPITAL_COMMUNITY)
Admission: EM | Disposition: A | Discharge status: Home Health | Payer: Self-pay | Source: Home / Self Care | Attending: Internal Medicine

## 2021-10-23 ENCOUNTER — Inpatient Hospital Stay: Payer: Medicare PPO | Admitting: Hematology and Oncology

## 2021-10-23 ENCOUNTER — Inpatient Hospital Stay (HOSPITAL_COMMUNITY): Payer: Medicare PPO

## 2021-10-23 DIAGNOSIS — J9621 Acute and chronic respiratory failure with hypoxia: Secondary | ICD-10-CM | POA: Diagnosis not present

## 2021-10-23 DIAGNOSIS — I340 Nonrheumatic mitral (valve) insufficiency: Secondary | ICD-10-CM | POA: Diagnosis not present

## 2021-10-23 DIAGNOSIS — J90 Pleural effusion, not elsewhere classified: Secondary | ICD-10-CM | POA: Diagnosis not present

## 2021-10-23 DIAGNOSIS — N179 Acute kidney failure, unspecified: Secondary | ICD-10-CM | POA: Diagnosis not present

## 2021-10-23 DIAGNOSIS — I5033 Acute on chronic diastolic (congestive) heart failure: Secondary | ICD-10-CM | POA: Diagnosis not present

## 2021-10-23 HISTORY — PX: CHEST TUBE INSERTION: SHX231

## 2021-10-23 LAB — BASIC METABOLIC PANEL
Anion gap: 8 (ref 5–15)
BUN: 63 mg/dL — ABNORMAL HIGH (ref 8–23)
CO2: 27 mmol/L (ref 22–32)
Calcium: 9 mg/dL (ref 8.9–10.3)
Chloride: 99 mmol/L (ref 98–111)
Creatinine, Ser: 2.29 mg/dL — ABNORMAL HIGH (ref 0.44–1.00)
GFR, Estimated: 20 mL/min — ABNORMAL LOW (ref >60)
Glucose, Bld: 103 mg/dL — ABNORMAL HIGH (ref 70–99)
Potassium: 3.9 mmol/L (ref 3.5–5.1)
Sodium: 134 mmol/L — ABNORMAL LOW (ref 135–145)

## 2021-10-23 LAB — CBC
HCT: 32.9 % — ABNORMAL LOW (ref 36.0–46.0)
Hemoglobin: 10.7 g/dL — ABNORMAL LOW (ref 12.0–15.0)
MCH: 30.1 pg (ref 26.0–34.0)
MCHC: 32.5 g/dL (ref 30.0–36.0)
MCV: 92.4 fL (ref 80.0–100.0)
Platelets: 199 10*3/uL (ref 150–400)
RBC: 3.56 MIL/uL — ABNORMAL LOW (ref 3.87–5.11)
RDW: 15.6 % — ABNORMAL HIGH (ref 11.5–15.5)
WBC: 8.3 10*3/uL (ref 4.0–10.5)
nRBC: 0 % (ref 0.0–0.2)

## 2021-10-23 SURGERY — INSERTION, PLEURAL DRAINAGE CATHETER
Anesthesia: LOCAL | Laterality: Left

## 2021-10-23 MED ORDER — HYDROCODONE-ACETAMINOPHEN 5-325 MG PO TABS
1.0000 | ORAL_TABLET | Freq: Once | ORAL | Status: AC
  Administered 2021-10-24: 1 via ORAL
  Filled 2021-10-23: qty 1

## 2021-10-23 MED ORDER — APIXABAN 5 MG PO TABS
5.0000 mg | ORAL_TABLET | Freq: Two times a day (BID) | ORAL | Status: DC
  Administered 2021-10-24 – 2021-10-25 (×3): 5 mg via ORAL
  Filled 2021-10-23 (×3): qty 1

## 2021-10-23 NOTE — Progress Notes (Signed)
Occupational Therapy Treatment Patient Details Name: Jillian Cooley MRN: [id number] DOB: Nov 30, 1932 Today's Date: 10/23/2021   History of present illness 87 y.o. female admitted 10/10/21 with severe SOB; workup for acute hypoxic respiratory failure requiring bipap. CXR with large L effusion. S/p thoracentesis 10/21, repeat 10/23. [place] 10/24 showed normal CO, high filling pressures. PMH includes CHF (home O2 PRN), DVT/PE s/p rotator cuff repair (04/2021).   OT comments  Pt completed bathing, dressing and grooming with supervision, sit<>stand at sink. Assisted to don compression socks, but pt donned gripper socks with set up. HR to 80s to 118 bpm, Sp02 96% on RA.   Recommendations for follow up therapy are one component of a multi-disciplinary discharge planning process, led by the attending physician.  Recommendations may be updated based on patient status, additional functional criteria and insurance authorization.    Follow Up Recommendations  Home health OT    Assistance Recommended at Discharge Intermittent Supervision/Assistance  Equipment Recommendations  Tub/shower seat    Recommendations for Other Services      Precautions / Restrictions Precautions Precautions: Fall Restrictions Weight Bearing Restrictions: No       Mobility Bed Mobility Overal bed mobility: Modified Independent                  Transfers Overall transfer level: Needs assistance Equipment used: Rolling walker (2 wheels) Transfers: Sit to/from Stand Sit to Stand: Supervision                 Balance Overall balance assessment: Needs assistance   Sitting balance-Leahy Scale: Good     Standing balance support: No upper extremity supported Standing balance-Leahy Scale: Fair                             ADL either performed or assessed with clinical judgement   ADL Overall ADL's : Needs assistance/impaired     Grooming: Oral  care;Standing;Supervision/safety;Wash/dry hands;Wash/dry face;Sitting;Set up   Upper Body Bathing: Set up;Sitting   Lower Body Bathing: Supervison/ safety;Sit to/from stand   Upper Body Dressing : Set up;Sitting   Lower Body Dressing: Set up;Sitting/lateral leans Lower Body Dressing Details (indicate cue type and reason): assisted with compression socks, pt donned gripper socks Toilet Transfer: Supervision/safety;Ambulation;BSC;Rolling walker (2 wheels)   Toileting- Clothing Manipulation and Hygiene: Supervision/safety;Sit to/from stand       Functional mobility during ADLs: Supervision/safety;Rolling walker (2 wheels)       Vision       Perception     Praxis      Cognition Arousal/Alertness: Awake/alert Behavior During Therapy: WFL for tasks assessed/performed Overall Cognitive Status: Within Functional Limits for tasks assessed                                            Exercises     Shoulder Instructions       General Comments      Pertinent Vitals/ Pain       Pain Assessment: No/denies pain  Home Living                                          Prior Functioning/Environment              Frequency  Min  2X/week        Progress Toward Goals  OT Goals(current goals can now be found in the care plan section)  Progress towards OT goals: Progressing toward goals  Acute Rehab OT Goals OT Goal Formulation: With patient Time For Goal Achievement: 11/01/21 Potential to Achieve Goals: Good  Plan Discharge plan remains appropriate;Frequency remains appropriate    Co-evaluation                 AM-PAC OT "6 Clicks" Daily Activity     Outcome Measure   Help from another person eating meals?: None Help from another person taking care of personal grooming?: A Little Help from another person toileting, which includes using toliet, bedpan, or urinal?: A Little Help from another person bathing (including  washing, rinsing, drying)?: A Little Help from another person to put on and taking off regular upper body clothing?: None Help from another person to put on and taking off regular lower body clothing?: A Little 6 Click Score: 20    End of Session Equipment Utilized During Treatment: Rolling walker (2 wheels)  OT Visit Diagnosis: Unsteadiness on feet (R26.81);Muscle weakness (generalized) (M62.81);Pain;Low vision, both eyes (H54.2)   Activity Tolerance Patient tolerated treatment well   Patient Left in chair;with call bell/phone within reach   Nurse Communication          Time: 7616-0737 OT Time Calculation (min): 26 min  Charges: OT General Charges $OT Visit: 1 Visit OT Treatments $Self Care/Home Management : 23-37 mins  Nestor Lewandowsky, OTR/L Acute Rehabilitation Services Pager: [phone] Office: [phone]   Malka So 10/23/2021, 9:41 AM

## 2021-10-23 NOTE — Progress Notes (Signed)
Mobility Specialist Progress Note    10/23/21 1546  Mobility  Activity Ambulated in hall  Level of Assistance Standby assist, set-up cues, supervision of patient - no hands on  Assistive Device Front wheel walker  Distance Ambulated (ft) 130 ft  Mobility Ambulated with assistance in hallway  Mobility Response Tolerated well  Mobility performed by Mobility specialist  $Mobility charge 1 Mobility   Pt received in bed and agreeable. No complaints on walk. Returned to use BR and transport ready to take her.   Hildred Alamin Mobility Specialist  Mobility Specialist Phone: [phone]

## 2021-10-23 NOTE — Progress Notes (Signed)
PT Cancellation Note  Patient Details Name: Jillian Cooley MRN: [id number] DOB: 1931/12/24   Cancelled Treatment:    Reason Eval/Treat Not Completed: Patient at procedure or test/unavailable Attempted 2x, still at procedure. Will plan to follow-up later as time permits.   Moishe Spice, PT, DPT Acute Rehabilitation Services  Pager: [phone] Office: [place] 10/23/2021, 4:43 PM

## 2021-10-23 NOTE — Progress Notes (Signed)
PROGRESS NOTE    Jillian Cooley   XBD:[id number]  DOB: 09/21/1932  PCP: [name], MD    DOA: 10/10/2021 LOS: 60    Brief Narrative / Hospital Course to Date:   87 y.o. F with dCHF on home O2 PRN, recent Dx MM not yet started chemo, DVT and PE after rotator cuff surgery last May, on apixaban, as well as recurrent transudative pleural effusion (see below) who presented with slowly progressive SOB, finally severe.  Required BiPAP initially.  Was found to have a large left-sided pleural effusion.  Was admitted to the intensive care unit.  Underwent thoracentesis x2 during this hospital stay.  Seen by cardiology as well.  Underwent right heart catheterization.  Subsequently underwent TEE that showed moderate/severe mitral regurg.  Then underwent cardiac MRI that only moderate mitral regur .  Repeat echocardiogram on 10/22/2021 showed moderate pleural effusion, no significant pericardial effusion.    Assessment & Plan   Principal Problem:   Acute on chronic respiratory failure with hypoxia (HCC) Active Problems:   Mitral valve insufficiency   Essential hypertension   Bilateral pulmonary embolism (HCC)   Chronic bilateral pleural effusions   Normocytic anemia   Multiple myeloma not having achieved remission (HCC)   Pressure injury of coccygeal region, stage 2 (HCC)   Acute on chronic diastolic CHF (congestive heart failure) (HCC)   Severe mitral regurgitation   PAH (pulmonary artery hypertension) (HCC)   Non-ST elevation (NSTEMI) myocardial infarction ([place])   Acute renal failure superimposed on stage 4 chronic kidney disease (HCC)   Hypotension, unspecified   Hyperphosphatemia   Hypokalemia   Mobitz type 1 second degree AV block   Protein-calorie malnutrition, severe   Pericardial effusion   Acute on chronic respiratory failure with hypoxia Secondary to CHF along with pleural effusion.   Uses oxygen PRN at home.   After thoracentesis, was weaned off oxygen.    Doing well on room air. --Supplement O2 if needed to keep sats >90%  Left-sided pleural effusion, recurrent -  Initially diagnosed in February.  Seen by pulmonology.  Has undergone multiple thoracentesis including during this hospital stay.  Patient underwent thoracentesis on 10/28.  Fluid analysis is consistent with a transudative process.  Thought to be secondary to cardiac process vs malignancy(?).   Chest x-ray was repeated on 10/29 and showed atelectasis.  No significant pleural effusion was noted.  Chest x-ray was repeated 11/2 by cardiology which shows an increase in the left-sided pleural effusion.  Changes suggestive of atelectasis versus pneumonitis noticed.  No history suggestive of infectious process.  Likely atelectasis.   --incentive spirometer.   --Pulmonology consulted, plan for PleurX catheter placement today.       Acute on chronic diastolic CHF NSTEMI Mobitz type I AV block Has been on and off of diuretics during this admission, mainly based on renal functionand symptoms.   Transitioned to oral furosemide 80 mg BID, then reduced to 80 mg daily on 10-20-2021.   Continue to monitor urine output.   Strict ins and outs and daily weight.    Elevated troponin levels unlikely due to ACS but demand ischemia.   --Continue aspirin and statin.   --Not noted to be on beta-blocker at this time.   --Not on ACE inhibitor or ARB due to elevated creatinine.  Mitral regurgitation - cardiac MRI suggest moderate mitral regurgitation.  TEE had suggested severe mitral regurgitation.  Mgmt per cardiology.  Cardiology does not think mitral regurg is the cause of pt's recurrent  pleural effusion.  AKI superimposed on CKD stage IV Hyponatremia Baseline creatinine is around 1.8-2.1.   Cr increased up to 2.7, now improving.  Dose of Lasix was decreased to 80 mg daily on 10-20-2021.   May have to tolerate a higher level of creatinine in this patient with CHF requiring diuretics.   Sodium level  has improved 134.  --Continue with lasix 80 mg daily. --Monitor renal function   History of bilateral Pe's - diagnosed in May this year. Has been on Eliquis.   Currently on IV heparin pending procedure/s.  Respiratory status is stable.   Resume Eliquis at discharge.   Should not need aspirin at discharge.  Pulmonary artery hypertension -  Cardiology and pulmonology has been following.  Essential hypertension Orthostatic hypotension Orthostatic drop in blood pressure noted when she worked with PT on 11/1, suspect due to her multiple comorbidities and heart conditions along with the use of diuretics.   Pt advised to be cautious when getting up.   TED stockings may also help.   Remains on furosemide.  Hydralazine stopped due to orthostatic hypotension on 10-21-2021.    Hypokalemia - replaced. Monitor and replace PRN.  History of multiple myeloma - Followed by [name] with oncology.  Has not been started on treatment yet due to her other comorbidities and acute issues.  Patient has an appointment with [name] on 10/23/2021 at 2:40 PM.  To be rescheduled.   Normocytic anemia - Stable.   No evidence of overt bleeding. Monitor CBC   Severe protein calorie malnutrition Etiology: chronic illness Signs/Symptoms: severe fat depletion, severe muscle depletion Interventions: Magic cup, MVI  Patient BMI: Body mass index is 19.1 kg/m.   DVT prophylaxis: Place TED hose Start: 10/21/21 1146 SCD's Start: 10/13/21 2340   Diet:  Diet Orders (From admission, onward)     Start     Ordered   10/22/21 1257  Diet regular Room service appropriate? Yes; Fluid consistency: Thin  Diet effective now       Question Answer Comment  Room service appropriate? Yes   Fluid consistency: Thin      10/22/21 1256              Code Status: Full Code   Subjective 10/23/21    Pt seen with sons visiting at bedside today.  She reports feeling well.  No F/C, CP, SOB, N/V or other complaints.   Hopes to go home soon.  To have PleurX placed later today.  We discussed this procedure being very similar to her prior thoracenteses.   Disposition Plan & Communication   Status is: Inpatient  Remains inpatient appropriate because: procedure planned later today.     Family Communication: sons at bedside on rounds   Consults, Procedures, Significant Events   Consultants:  Cardiology Pulmonology  Procedures:  Thoracenteses 10/21, 10/23, 10/28 PleurX catheter placement today, 11/3  Antimicrobials:  Anti-infectives (From admission, onward)    Start     Dose/Rate Route Frequency Ordered Stop   10/10/21 1900  cefTRIAXone (ROCEPHIN) 2 g in sodium chloride 0.9 % 100 mL IVPB  Status:  Discontinued        2 g 200 mL/hr over 30 Minutes Intravenous Every 24 hours 10/10/21 1457 10/11/21 1039   10/10/21 1900  azithromycin (ZITHROMAX) 500 mg in sodium chloride 0.9 % 250 mL IVPB  Status:  Discontinued        500 mg 250 mL/hr over 60 Minutes Intravenous Every 24 hours 10/10/21 1457 10/11/21 1015  10/10/21 1045  piperacillin-tazobactam (ZOSYN) IVPB 3.375 g        3.375 g 100 mL/hr over 30 Minutes Intravenous  Once 10/10/21 1034 10/10/21 1255   10/10/21 1030  piperacillin-tazobactam (ZOSYN) IVPB 2.25 g  Status:  Discontinued        2.25 g 100 mL/hr over 30 Minutes Intravenous  Once 10/10/21 1017 10/10/21 1034         Micro    Objective   Vitals:   10/23/21 1135 10/23/21 1550 10/23/21 1703 10/23/21 1713  BP: (!) 105/59 114/60 (!) 109/57 (!) 105/56  Pulse: (!) 106 96 (!) 102 (!) 102  Resp: 20 20 (!) 25 16  Temp: 98.1 F (36.7 C) (!) 97.2 F (36.2 C)    TempSrc: Oral Temporal    SpO2: 95% 100% 99% 100%  Weight:  48.9 kg    Height:  _0  (1.6 m)      Intake/Output Summary (Last 24 hours) at 10/23/2021 1720 Last data filed at 10/23/2021 1710 Gross per 24 hour  Intake 240 ml  Output 1500 ml  Net -1260 ml   Filed Weights   10/21/21 0505 10/23/21 1000 10/23/21 1550   Weight: 50.1 kg 48.9 kg 48.9 kg    Physical Exam:  General exam: awake, alert, no acute distress HEENT: atraumatic, clear conjunctiva, anicteric sclera, moist mucus membranes, hearing grossly normal  Respiratory system: clear with diminished bases L>R, no wheezes, rales or rhonchi, normal respiratory effort. Cardiovascular system: normal S1/S2, RRR, no JVD, murmurs, rubs, gallops, no pedal edema.   Gastrointestinal system: soft, NT, ND, no HSM felt, +bowel sounds. Central nervous system: A&O x4. no gross focal neurologic deficits, normal speech Extremities: moves all, no edema, normal tone Skin: dry, intact, normal temperature Psychiatry: normal mood, congruent affect, judgement and insight appear normal  Labs   Data Reviewed: I have personally reviewed following labs and imaging studies  CBC: Recent Labs  Lab 10/19/21 0253 10/20/21 0156 10/21/21 0235 10/22/21 0325 10/23/21 0235  WBC 8.8 9.7 7.7 9.0 8.3  HGB 11.0* 11.1* 10.8* 10.3* 10.7*  HCT 33.9* 34.3* 32.8* 32.3* 32.9*  MCV 92.1 91.5 90.9 92.6 92.4  PLT 219 215 209 200 625   Basic Metabolic Panel: Recent Labs  Lab 10/18/21 0152 10/19/21 0253 10/20/21 0156 10/21/21 0235 10/22/21 0325 10/23/21 0235  NA 136 133* 132* 134* 135 134*  K 5.0 4.1 4.2 4.1 4.0 3.9  CL 104 101 97* 98 99 99  CO2 _1 GLUCOSE 121* 111* 107* 107* 100* 103*  BUN 61* 65* 68* 66* 65* 63*  CREATININE 2.00* 2.10* 2.28* 2.45* 2.42* 2.29*  CALCIUM 8.9 8.8* 9.1 9.2 9.1 9.0  MG 2.1  --   --   --   --   --    GFR: Estimated Creatinine Clearance: 13.4 mL/min (A) (by C-G formula based on SCr of 2.29 mg/dL (H)). Liver Function Tests: No results for input(s): AST, ALT, ALKPHOS, BILITOT, PROT, ALBUMIN in the last 168 hours. No results for input(s): LIPASE, AMYLASE in the last 168 hours. No results for input(s): AMMONIA in the last 168 hours. Coagulation Profile: No results for input(s): INR, PROTIME in the last 168 hours. Cardiac  Enzymes: No results for input(s): CKTOTAL, CKMB, CKMBINDEX, TROPONINI in the last 168 hours. BNP (last 3 results) Recent Labs    02/28/21 1109 07/07/21 1500  PROBNP 1,545* 3,761*   HbA1C: No results for input(s): HGBA1C in the last 72 hours. CBG: No results for  input(s): GLUCAP in the last 168 hours. Lipid Profile: No results for input(s): CHOL, HDL, LDLCALC, TRIG, CHOLHDL, LDLDIRECT in the last 72 hours. Thyroid Function Tests: No results for input(s): TSH, T4TOTAL, FREET4, T3FREE, THYROIDAB in the last 72 hours. Anemia Panel: No results for input(s): VITAMINB12, FOLATE, FERRITIN, TIBC, IRON, RETICCTPCT in the last 72 hours. Sepsis Labs: No results for input(s): PROCALCITON, LATICACIDVEN in the last 168 hours.  No results found for this or any previous visit (from the past 240 hour(s)).    Imaging Studies   ECHOCARDIOGRAM LIMITED  Result Date: 10/22/2021    ECHOCARDIOGRAM LIMITED REPORT   Patient Name:   AYDIA MAJ Date of Exam: 10/22/2021 Medical Rec #:  [id number]             Height:       63.0 in Accession #:    [id number]            Weight:       110.5 lb Date of Birth:  1932-02-04             BSA:          1.503 m Patient Age:    20 years              BP:           95/65 mmHg Patient Gender: F                     HR:           84 bpm. Exam Location:  Inpatient Procedure: 2D Echo, Cardiac Doppler and Limited Color Doppler Indications:    lmtd for pericardial effusion  History:        Patient has prior history of Echocardiogram examinations, most                 recent 10/10/2021. Previous Myocardial Infarction, Pulmonary                 HTN, Mitral Valve Disease; Risk Factors:Hypertension. Pulmonary                 Embolism                 Pericardial effusion.  Sonographer:    Glo Herring Referring Phys: [id number] [place]  Conclusion(s)/Recommendation(s): Normal LV size and function. Moderate pleural effusion. No significant pericardial effusion. Normal  IVC. LEFT VENTRICLE PLAX 2D LVIDd:         3.00 cm LVIDs:         2.10 cm LV PW:         1.30 cm LV IVS:        1.30 cm  IVC IVC diam: 1.70 cm LEFT ATRIUM         Index LA diam:    3.80 cm 2.53 cm/m Phineas Inches Electronically signed by Phineas Inches Signature Date/Time: 10/22/2021/7:50:28 PM    Final      Medications   Scheduled Meds:  [MAR Hold] aspirin EC  81 mg Oral Daily   [MAR Hold] atorvastatin  20 mg Oral Daily   [MAR Hold] feeding supplement  1 Container Oral TID BM   [MAR Hold] furosemide  80 mg Oral Daily   [MAR Hold] mouth rinse  15 mL Mouth Rinse BID   [MAR Hold] multivitamin with minerals  1 tablet Oral Daily   [MAR Hold] sodium chloride flush  3 mL Intravenous Q12H   Continuous Infusions:  [facility]  Hold] sodium chloride Stopped (10/10/21 1902)       LOS: 13 days    Time spent: 30 minutes   Ezekiel Slocumb, DO [facility]  10/23/2021, 5:20 PM      If 7PM-7AM, please contact night-coverage. How to contact the [facility] Attending or Consulting provider [place] or covering provider during after hours [place], for this patient?    Check the care team in [facility] and look for a) attending/consulting [facility] provider listed and b) the [facility] team listed Log into [email or web address] and use [place]'s universal password to access. If you do not have the password, please contact the hospital operator. Locate the [facility] provider you are looking for under [facility] and page to a number that you can be directly reached. If you still have difficulty reaching the provider, please page the [facility] (Director on Call) for the Hospitalists listed on amion for assistance.

## 2021-10-23 NOTE — Progress Notes (Signed)
[place] for Heparin (apixaban PTA) Indication: NSTEMI and PE  Allergies  Allergen Reactions   Sulfa Antibiotics Nausea Only    Patient Measurements: Height: 5\' 3"  (160 cm) Weight: 48.9 kg (107 lb 12.9 oz) IBW/kg (Calculated) : 52.4 Heparin Dosing Weight: TBW  Vital Signs: Temp: 97.9 F (36.6 C) (11/03 1731) Temp Source: Oral (11/03 1731) BP: 118/64 (11/03 1731) Pulse Rate: 99 (11/03 1731)  Labs: Recent Labs    10/21/21 0235 10/22/21 0325 10/22/21 1314 10/22/21 2233 10/23/21 0235  HGB 10.8* 10.3*  --   --  10.7*  HCT 32.8* 32.3*  --   --  32.9*  PLT 209 200  --   --  199  HEPARINUNFRC 0.63 0.80* 0.78* <0.10*  --   CREATININE 2.45* 2.42*  --   --  2.29*     Estimated Creatinine Clearance: 13.4 mL/min (A) (by C-G formula based on SCr of 2.29 mg/dL (H)).  Medications:  Scheduled:   [START ON 10/24/2021] apixaban  5 mg Oral BID   atorvastatin  20 mg Oral Daily   feeding supplement  1 Container Oral TID BM   furosemide  80 mg Oral Daily   mouth rinse  15 mL Mouth Rinse BID   multivitamin with minerals  1 tablet Oral Daily   sodium chloride flush  3 mL Intravenous Q12H   Infusions:   sodium chloride Stopped (10/10/21 1902)    Assessment: 30 yoF presented to ED with ShOB.  Heparin per pharmacy for ACS, hx PE/DVT on apixaban.  PTA Eliquis held, last dose 10/20 at 18:00.  Patient on heparin until  pleural effusions are stable and plueurex placed   PleurX catheter paced 11/3  heparin on hold - will not resume Plan to resume apixaban 11/4   Goal of Therapy:  Monitor platelets by anticoagulation protocol: Yes   Plan:  Stop heparin Apixaban 5mg  BID start 11/4    Bonnita Nasuti Pharm.D. CPP, BCPS Clinical Pharmacist [phone] 10/23/2021 6:51 PM   Please check AMION for all [place] numbers 10/23/2021

## 2021-10-23 NOTE — TOC Initial Note (Signed)
Transition of Care [facility]) - Initial/Assessment Note    Patient Details  Name: Jillian Cooley MRN: [id number] Date of Birth: 25-Apr-1932  Transition of Care [facility]) CM/SW Contact:    Bethena Roys, RN Phone Number: 10/23/2021, 3:57 PM  Clinical Narrative: Risk for readmission assessment completed. Case Manager spoke with patient regarding home health needs. Patient is independent from home with spouse and has support from children. Patient has a shower chair and is in need of a rolling walker. Patient is agreeable to durable medical equipment with [employer]-rolling walker will be delivered to the room prior to discharge home. Case Manager discussed home health services post PleurX Drain- patient is agreeable to RN, PT,OT- no preference with agency; however to be in network with insurance. Case Manager reached out to [facility] and they can service the patient. Patient and family will need education regarding the PleurX Drain-  just in case the [facility] agency may not be able to visit over the weekend. Case Manager will ask the unit staff to order a box of ten PleurX Canisters for home. Case Manager will fill out the paperwork and fax to [place]. Case Manager will continue to follow for additional needs.    Expected Discharge Plan: [place] Barriers to Discharge: Continued Medical Work up   Patient Goals and CMS Choice Patient states their goals for this hospitalization and ongoing recovery are:: to go home with home health services. CMS Medicare.gov Compare Post Acute Care list provided to:: Patient Choice offered to / list presented to : Adult Children (No preference- in network with insurance.)  Expected Discharge Plan and Services Expected Discharge Plan: [place] In-house Referral: NA Discharge Planning Services: CM Consult   Living arrangements for the past 2 months: Single Family Home                 DME Arranged: Walker rolling DME Agency:  [employer] Date DME Agency Contacted: 10/23/21 Time DME Agency Contacted: [id number] Representative spoke with at DME Agency: Freda Munro HH Arranged: RN, PT, OT [facility] Agency: [place] ([place]) Date [place]: 10/23/21 Time [place]: 59 Representative spoke with at [place]: Tommi Rumps  Prior Living Arrangements/Services Living arrangements for the past 2 months: [place] with:: Spouse Patient language and need for interpreter reviewed:: Yes Do you feel safe going back to the place where you live?: Yes      Need for Family Participation in Patient Care: Yes (Comment) Care giver support system in place?: Yes (comment) Current home services: DME (Patient has a shower chair) Criminal Activity/Legal Involvement Pertinent to Current Situation/Hospitalization: No - Comment as needed  Activities of Daily Living Home Assistive Devices/Equipment: Eyeglasses, Oxygen ADL Screening (condition at time of admission) Patient's cognitive ability adequate to safely complete daily activities?: Yes Is the patient deaf or have difficulty hearing?: No Does the patient have difficulty seeing, even when wearing glasses/contacts?: Yes (has macular degeneration) Does the patient have difficulty concentrating, remembering, or making decisions?: No Patient able to express need for assistance with ADLs?: Yes Does the patient have difficulty dressing or bathing?: Yes (secondary to shortness of breath and weakness) Independently performs ADLs?: No (secondary to shortness of breath and weakness) Communication: Independent Dressing (OT): Needs assistance Is this a change from baseline?: Change from baseline, expected to last >3 days Grooming: Needs assistance Is this a change from baseline?: Change from baseline, expected to last >3 days Feeding: Needs assistance Is this a change  from baseline?: Change from baseline, expected to last >3 days Bathing: Needs assistance Is this  a change from baseline?: Change from baseline, expected to last >3 days Toileting: Needs assistance Is this a change from baseline?: Change from baseline, expected to last >3days In/Out Bed: Needs assistance Is this a change from baseline?: Change from baseline, expected to last >3 days Walks in Home: Needs assistance Is this a change from baseline?: Change from baseline, expected to last >3 days Does the patient have difficulty walking or climbing stairs?: Yes (secondary to shortness of breath and weakness) Weakness of Legs: Both Weakness of Arms/Hands: None  Permission Sought/Granted Permission sought to share information with : Facility Sport and exercise psychologist, Family Supports, Case Manager Permission granted to share information with : Yes, Verbal Permission Granted     Permission granted to share info w AGENCY: [place], [employer].        Emotional Assessment Appearance:: Appears stated age Attitude/Demeanor/Rapport: Engaged Affect (typically observed): Appropriate Orientation: : Oriented to Place, Oriented to Situation, Oriented to Self, Oriented to  Time Alcohol / Substance Use: Not Applicable Psych Involvement: No (comment)  Admission diagnosis:  Acidosis [E87.20] Acute respiratory failure (HCC) [J96.00] Status post thoracentesis [Z98.890] Pleural effusion on left [J90] Pleural effusion, left [J90] Acute respiratory failure, unspecified whether with hypoxia or hypercapnia (HCC) [J96.00] Patient Active Problem List   Diagnosis Date Noted   Pericardial effusion    Hypotension, unspecified 10/15/2021   Hyperphosphatemia 10/15/2021   Hypokalemia 10/15/2021   Mobitz type 1 second degree AV block 10/15/2021   Protein-calorie malnutrition, severe 10/15/2021   Severe mitral regurgitation    PAH (pulmonary artery hypertension) (HCC)    Non-ST elevation (NSTEMI) myocardial infarction ([place])    Acute renal failure superimposed on stage 4 chronic kidney disease (HCC)    Acute on  chronic diastolic CHF (congestive heart failure) (HCC)    Pressure injury of coccygeal region, stage 2 ([place]) 10/12/2021   Acute on chronic respiratory failure with hypoxia ([place]) 10/10/2021   Multiple myeloma not having achieved remission ([place]) 09/30/2021   Normocytic anemia 09/18/2021   Monoclonal gammopathy 09/18/2021   Chronic bilateral pleural effusions    Absolute anemia 08/12/2021   Chronic anticoagulation 08/12/2021   Bilateral pulmonary embolism ([place]) 06/18/2021   DVT, bilateral lower limbs ([place]) 06/18/2021   S/P right rotator cuff repair 04/23/2021   S/P arthroscopy of shoulder 04/23/2021   CHF (congestive heart failure) ([place])    Mitral valve insufficiency 02/21/2021   Nonrheumatic tricuspid valve regurgitation 02/21/2021   Nonrheumatic aortic valve insufficiency 02/21/2021   Preoperative cardiovascular examination 02/21/2021   Essential hypertension 02/21/2021   Herniated nucleus pulposus, lumbar 06/13/2012   PCP:  [name], MD Pharmacy:   [id number] - [place], [place] [address] Phone: [phone] Fax: [phone]   Readmission Risk Interventions Readmission Risk Prevention Plan 10/23/2021  Transportation Screening Complete  Medication Review (RN Care Manager) Complete  PCP or Specialist appointment within 3-5 days of discharge Complete  HRI or Home Care Consult Complete  SW Recovery Care/Counseling Consult Complete  Palliative Care Screening Not Applicable  Skilled Nursing Facility Not Applicable  Some recent data might be hidden

## 2021-10-23 NOTE — Progress Notes (Signed)
[place] for Heparin (apixaban PTA) Indication: NSTEMI and PE  Allergies  Allergen Reactions   Sulfa Antibiotics Nausea Only    Patient Measurements: Height: 5\' 3"  (160 cm) Weight: 48.9 kg (107 lb 11.2 oz) IBW/kg (Calculated) : 52.4 Heparin Dosing Weight: TBW  Vital Signs: Temp: 98.1 F (36.7 C) (11/03 1135) Temp Source: Oral (11/03 1135) BP: 105/59 (11/03 1135) Pulse Rate: 106 (11/03 1135)  Labs: Recent Labs    10/21/21 0235 10/22/21 0325 10/22/21 1314 10/22/21 2233 10/23/21 0235  HGB 10.8* 10.3*  --   --  10.7*  HCT 32.8* 32.3*  --   --  32.9*  PLT 209 200  --   --  199  HEPARINUNFRC 0.63 0.80* 0.78* <0.10*  --   CREATININE 2.45* 2.42*  --   --  2.29*     Estimated Creatinine Clearance: 13.4 mL/min (A) (by C-G formula based on SCr of 2.29 mg/dL (H)).  Medications:  Scheduled:   aspirin EC  81 mg Oral Daily   atorvastatin  20 mg Oral Daily   feeding supplement  1 Container Oral TID BM   furosemide  80 mg Oral Daily   mouth rinse  15 mL Mouth Rinse BID   multivitamin with minerals  1 tablet Oral Daily   sodium chloride flush  3 mL Intravenous Q12H   Infusions:   sodium chloride Stopped (10/10/21 1902)    Assessment: 70 yoF presented to ED with ShOB.  Heparin per pharmacy for ACS, hx PE/DVT on apixaban.  PTA Eliquis held, last dose 10/20 at 18:00.  Patient will remain on heparin until it is deemed that her pleural effusions are stable and will not require further thoracentesis and then restart Eliquis.  PleurX catheter planned this afternoon, heparin on hold.  Goal of Therapy:  Heparin level 0.3-0.7 units/ml Monitor platelets by anticoagulation protocol: Yes   Plan:  Heparin on hold Pharmacy will F/U resuming 11/4    [name], PharmD, BCPS, [facility] Clinical Pharmacist [phone] Please check AMION for all [place] numbers 10/23/2021

## 2021-10-23 NOTE — Progress Notes (Addendum)
Pt stated still having pain near drain insertion site. Hospitalist paged awaiting call back. Pt given Hydrocodone 5 mg PO as ordered see EMAR for details. Will f/u with results.

## 2021-10-23 NOTE — Procedures (Signed)
PleurX Insertion Procedure Note  Jillian Cooley  [id number]  29-Jul-1932  Date:10/23/21  Time:5:24 PM   Provider Performing:Ayiden Milliman Loletha Grayer Tamala Julian  Procedure: PleurX Tunneled Pleural Catheter Placement (32761)  Indication(s) Relief of dyspnea from recurrent effusion  Consent Risks of the procedure as well as the alternatives and risks of each were explained to the patient and/or caregiver.  Consent for the procedure was obtained.   Anesthesia Topical only with 1% lidocaine    Time Out Verified patient identification, verified procedure, site/side was marked, verified correct patient position, special equipment/implants available, medications/allergies/relevant history reviewed, required imaging and test results available.   Sterile Technique Maximal sterile technique including sterile barrier drape, hand hygiene, sterile gown, sterile gloves, mask, hair covering.    Procedure Description Ultrasound used to identify appropriate pleural anatomy for placement and overlying skin marked.  Area of drainage cleaned and draped in sterile fashion.   Lidocaine was used to anesthetize the skin and subcutaneous tissue.   1.5 cm incision made overlying fluid and another about 5 cm anterior to this along chest wall.  PleurX catheter inserted in usual sterile fashion using modified seldinger technique.  Interrupted silk sutures placed at catheter insertion and tunneling points which will be removed at later date.  PleurX catheter then hooked to suction.  After fluid aspirated, pleurX capped and sterile dressing applied.   Complications/Tolerance None; patient tolerated the procedure well. Chest X-ray is ordered to confirm no post-procedural complication.   EBL Minimal   Specimen(s) none

## 2021-10-23 NOTE — Progress Notes (Signed)
10/23/2021   I have seen and evaluated the patient for recurrent effusion.  S:  Seen in endo, no events, ready to have procedure done.  O: Blood pressure (!) 105/56, pulse (!) 102, temperature (!) 97.2 F (36.2 C), temperature source Temporal, resp. rate 16, height 5\' 3"  (1.6 m), weight 48.9 kg, SpO2 100 %.  No distress, moderate effusion on [place] Scattered bruising stable  A:  Recurrent effusion, see discussion yesterday  P:  PleurX today then will need training by CNS for home drainage Will see her tomorrow and talk her through Would drain daily for now Will outline OP f/u tomorrow  Erskine Emery MD [place] [facility] [place] epic messenger for cross cover needs If after hours, please call E-link

## 2021-10-23 NOTE — Care Management Important Message (Signed)
Important Message  Patient Details  Name: Jillian Cooley MRN: [id number] Date of Birth: 09-25-1932   Medicare Important Message Given:  Yes     Shelda Altes 10/23/2021, 10:54 AM

## 2021-10-23 NOTE — Progress Notes (Signed)
Progress Note  Patient Name: Jillian Cooley Date of Encounter: 10/23/2021  [facility] Cardiologist: Werner Lean, MD   Subjective   Denies any chest pain or dyspnea  Inpatient Medications    Scheduled Meds:  aspirin EC  81 mg Oral Daily   atorvastatin  20 mg Oral Daily   feeding supplement  1 Container Oral TID BM   furosemide  80 mg Oral Daily   mouth rinse  15 mL Mouth Rinse BID   multivitamin with minerals  1 tablet Oral Daily   sodium chloride flush  3 mL Intravenous Q12H   Continuous Infusions:  sodium chloride Stopped (10/10/21 1902)   PRN Meds: sodium chloride, acetaminophen, docusate sodium, ondansetron (ZOFRAN) IV, polyethylene glycol   Vital Signs    Vitals:   10/23/21 0314 10/23/21 0549 10/23/21 0804 10/23/21 1000  BP:  (!) 105/59 109/63   Pulse:  99 94   Resp:  15 18   Temp:  97.6 F (36.4 C) 97.6 F (36.4 C)   TempSrc:  Oral Oral   SpO2: 98% 97% 96%   Weight:    48.9 kg  Height:        Intake/Output Summary (Last 24 hours) at 10/23/2021 1027 Last data filed at 10/23/2021 0816 Gross per 24 hour  Intake 240 ml  Output 700 ml  Net -460 ml    Last 3 Weights 10/23/2021 10/21/2021 10/20/2021  Weight (lbs) 107 lb 11.2 oz 110 lb 8 oz 111 lb 8 oz  Weight (kg) 48.852 kg 50.122 kg 50.576 kg      Telemetry    Normal sinus rhythm with PVCs- Personally Reviewed  ECG    No new EKG to review- Personally Reviewed  Physical Exam  GEN: Thin and frail appearing HEENT: Normal NECK: No JVD LYMPHATICS: No lymphadenopathy CARDIAC:RRR, no murmurs, rubs, gallops RESPIRATORY:  diminished at left base ABDOMEN: Soft, non-tender, non-distended MUSCULOSKELETAL:  No edema; No deformity  SKIN: Warm and dry NEUROLOGIC:  Alert and oriented x 3 PSYCHIATRIC:  Normal affect   Labs    High Sensitivity Troponin:   Recent Labs  Lab 10/10/21 0731 10/10/21 0935 10/10/21 1548 10/10/21 1822 10/12/21 0252  TROPONINIHS 523* 836* 3,307* 5,378*  5,247*      Chemistry Recent Labs  Lab 10/18/21 0152 10/19/21 0253 10/21/21 0235 10/22/21 0325 10/23/21 0235  NA 136   < > 134* 135 134*  K 5.0   < > 4.1 4.0 3.9  CL 104   < > 98 99 99  CO2 25   < > $R'26 26 27  'kQ$ GLUCOSE 121*   < > 107* 100* 103*  BUN 61*   < > 66* 65* 63*  CREATININE 2.00*   < > 2.45* 2.42* 2.29*  CALCIUM 8.9   < > 9.2 9.1 9.0  MG 2.1  --   --   --   --   GFRNONAA 24*   < > 19* 19* 20*  ANIONGAP 7   < > $R'10 10 8   'wo$ < > = values in this interval not displayed.     Lipids  No results for input(s): CHOL, TRIG, HDL, LABVLDL, LDLCALC, CHOLHDL in the last 168 hours.   Hematology Recent Labs  Lab 10/21/21 0235 10/22/21 0325 10/23/21 0235  WBC 7.7 9.0 8.3  RBC 3.61* 3.49* 3.56*  HGB 10.8* 10.3* 10.7*  HCT 32.8* 32.3* 32.9*  MCV 90.9 92.6 92.4  MCH 29.9 29.5 30.1  MCHC 32.9 31.9 32.5  RDW  15.5 15.7* 15.6*  PLT 209 200 199    Thyroid No results for input(s): TSH, FREET4 in the last 168 hours.  BNP No results for input(s): BNP, PROBNP in the last 168 hours.   DDimer No results for input(s): DDIMER in the last 168 hours.   Radiology    ECHOCARDIOGRAM LIMITED  Result Date: 10/22/2021    ECHOCARDIOGRAM LIMITED REPORT   Patient Name:   Jillian Cooley Date of Exam: 10/22/2021 Medical Rec #:  [id number]             Height:       63.0 in Accession #:    [id number]            Weight:       110.5 lb Date of Birth:  Oct 08, 1932             BSA:          1.503 m Patient Age:    87 years              BP:           95/65 mmHg Patient Gender: F                     HR:           84 bpm. Exam Location:  Inpatient Procedure: 2D Echo, Cardiac Doppler and Limited Color Doppler Indications:    lmtd for pericardial effusion  History:        Patient has prior history of Echocardiogram examinations, most                 recent 10/10/2021. Previous Myocardial Infarction, Pulmonary                 HTN, Mitral Valve Disease; Risk Factors:Hypertension. Pulmonary                  Embolism                 Pericardial effusion.  Sonographer:    Glo Herring Referring Phys: [id number] [place]  Conclusion(s)/Recommendation(s): Normal LV size and function. Moderate pleural effusion. No significant pericardial effusion. Normal IVC. LEFT VENTRICLE PLAX 2D LVIDd:         3.00 cm LVIDs:         2.10 cm LV PW:         1.30 cm LV IVS:        1.30 cm  IVC IVC diam: 1.70 cm LEFT ATRIUM         Index LA diam:    3.80 cm 2.53 cm/m Phineas Inches Electronically signed by Phineas Inches Signature Date/Time: 10/22/2021/7:50:28 PM    Final     Cardiac Studies   Echocardiogram 10/10/2021      1. Normal LV function; mild AI; severe MR; elevated mean gradient across  MV of 5 mmHg likely at least partially related to severe MR; moderate TR.   2. Left ventricular ejection fraction, by estimation, is 60 to 65%. The  left ventricle has normal function. The left ventricle has no regional  wall motion abnormalities. Left ventricular diastolic parameters are  indeterminate.   3. Right ventricular systolic function is normal. The right ventricular  size is normal. There is moderately elevated pulmonary artery systolic  pressure.   4. Left atrial size was moderately dilated.   5. Large pleural effusion in the left lateral region.   6. The mitral valve is abnormal.  Severe mitral valve regurgitation. Mild  mitral stenosis.   7. Tricuspid valve regurgitation is moderate.   8. The aortic valve has an indeterminant number of cusps. Aortic valve  regurgitation is mild. No aortic stenosis is present.   9. The inferior vena cava is normal in size with <50% respiratory  variability, suggesting right atrial pressure of 8 mmHg.        Right heart catheterization 10/14/2021   Conclusion   1. Right and left heart filling pressures are elevated.  2. Prominent v-wave suggestive of significant mitral regurgitation.  3. Cardiac output is normal.  4. Mixed pulmonary venous/pulmonary arterial  HTN   Right Heart   Right Heart Pressures RHC Procedural Findings: Hemodynamics (mmHg) RA mean 11 RV 66/9 PA 74/29, mean 49 PCWP mean 25, prominent V-waves to 37  Oxygen saturations: PA 62% AO 100%  Cardiac Output (Fick) 4.1  Cardiac Index (Fick) 2.6 PVR 5.8 WU      Patient Profile     87 y.o. female with multiple myeloma, HTN, HLD, valvular heart disease (at least moderate, probably severe MR), CKD stage 4, carpal tunnel, bilateral DVT/PE, pleural effusion s/p repeated thoracentesis admitted 10/21 with hypoxic respiratory failure and hypotension, lactic acidosis, elevated troponin, treated with IVF and stress dose steroids. She underwent L sided thoracentesis on 10/21 (1.5L) and repeat L sided thoracentesis on 10/23 (1.2L). Cytology -ve for malignancy.  Right heart catheterization performed 10/13/2021 showed mixed findings of acute left heart failure and intrinsic pulmonary artery hypertension (consider secondary to pulm embolism).  Transferred to [facility] for TEE to assess if MitraClip is an option, possibly involve HF team if not.  Assessment & Plan    Acute diastolic heart failure: Echo 10/10/2021 with normal LV function, mild AI, severe MR, moderate TR.  [place] 10/13/2021 with RA 11, RV 66/9, PA 74/29/49, PCWP 25 with prominent V waves to 37, PA sat 62%, CI 2.6.  TEE 10/28 with thickened mitral valve with restricted leaflet motion and can measure all mall coaptation, severe MR, mild MS, EF 60 to 65%, mild RV dysfunction, mild AI. -On review of the TEE, I think the MR appears more moderate, 3D VCA totaled 0.19cm^2 for the 2 jets.  In addition there is no murmur on exam. Given discordant parameters on TEE, recommended cardiac MRI to quantify MR severity, which showed moderate MR (regurgitant fraction 21%)   -Given her multiple myeloma, cardiac MRI was also done to evaluate for evidence of amyloidosis.  Cardiac MRI does show some features of amyloid (LVH, elevated native T1) though nonspecific  findings as unable to give contrast due to renal function, so not able to evaluate ECV or LGE to confirm amyloidosis  -Was started on hydralazine, held due to soft BP -Have avoided ACE/ARB given renal dysfunction -Creatinine trending up and worsening hyponatremia, decreased Lasix to 80 mg daily 10/31, renal function has stabilized.  Would continue Lasix 80 mg daily  Mitral insufficiency: Described as moderate on outside echocardiogram performed in February (unable to review images), moderate (but possibly underestimated) by echo in May.  TEE concerning for severe MR though there were discordant parameters as above (3D VCA 0.19cm^2); cardiac MRI shows moderate MR (regurgitant fraction 21%)  PAH: Mixed etiology due to diastolic heart failure and intrinsic pulmonary arterial disease (possibly sequelae of previous pulmonary embolism).  Note that she had evidence of moderate to severe pulmonary hypertension by echocardiogram performed in February. -Continue diuretics with Lasix 80 mg daily  Acute on chronic respiratory failure with hypoxia:  Improved with thoracentesis and diuresis, but pleural effusion re-accumulated fast.  -Chest x-ray 10/29 showed slightly increased atelectasis versus airspace disease in the left lower lung and little significant change in bilateral effusions with right lower lung consolidation/atelectasis.  Chest x-ray 11/1 with bilateral pleural effusions with interval increase in left pleural effusion.  Pulmonology reconsulted, recommending Pleurx catheter repeat limited echo 11/2, -Continue p.o. Lasix 80 mg daily  NSTEMI: She does not have any regional wall motion abnormalities on the echocardiogram, but it is quite possible that she has underlying CAD based on the degree of increase in cardiac enzymes.   -She is not a candidate for invasive angiography and percutaneous revascularization or surgery.   -It is quite reasonable to treat her with aggressive lipid-lowering therapy.    -Continue aspirin 81 mg daily but this should be stopped when she restarts treatment with Eliquis for her pulmonary embolism  PE: Although this occurred in the setting of postoperative state, it is very likely that she has a hypercoagulable condition due to the hematological disorder.   -Currently on IV heparin until it is deemed that her pleural effusions are stable and will require no further thoracentesis and then restart Eliquis -Probably will remain on lifelong anticoagulation.  Sinus pauses: No significant bradycardia on telemetry and mostly blocked PACs  AKI on Chronic kidney disease stage IV: Creatinine stable at 2.4 today  Multiple myeloma: to discuss plans to resume therapy w her Oncologist.  Pericardial effusion: small to moderate on CMR 10/31, did not have effusion on prior echo.  Repeated limited echo 11/2, no significant pericardial effusion     For questions or updates, please contact [place] Please consult [email or web address] for contact info under        Signed, Donato Heinz, MD  10/23/2021, 10:27 AM

## 2021-10-24 ENCOUNTER — Inpatient Hospital Stay (HOSPITAL_COMMUNITY): Payer: Medicare PPO

## 2021-10-24 ENCOUNTER — Other Ambulatory Visit: Payer: Self-pay | Admitting: Physician Assistant

## 2021-10-24 DIAGNOSIS — I340 Nonrheumatic mitral (valve) insufficiency: Secondary | ICD-10-CM | POA: Diagnosis not present

## 2021-10-24 DIAGNOSIS — J90 Pleural effusion, not elsewhere classified: Secondary | ICD-10-CM | POA: Diagnosis not present

## 2021-10-24 DIAGNOSIS — J9621 Acute and chronic respiratory failure with hypoxia: Secondary | ICD-10-CM | POA: Diagnosis not present

## 2021-10-24 DIAGNOSIS — I5033 Acute on chronic diastolic (congestive) heart failure: Secondary | ICD-10-CM | POA: Diagnosis not present

## 2021-10-24 DIAGNOSIS — N179 Acute kidney failure, unspecified: Secondary | ICD-10-CM | POA: Diagnosis not present

## 2021-10-24 DIAGNOSIS — Z79899 Other long term (current) drug therapy: Secondary | ICD-10-CM

## 2021-10-24 LAB — BASIC METABOLIC PANEL
Anion gap: 7 (ref 5–15)
BUN: 61 mg/dL — ABNORMAL HIGH (ref 8–23)
CO2: 28 mmol/L (ref 22–32)
Calcium: 9.1 mg/dL (ref 8.9–10.3)
Chloride: 98 mmol/L (ref 98–111)
Creatinine, Ser: 2.42 mg/dL — ABNORMAL HIGH (ref 0.44–1.00)
GFR, Estimated: 19 mL/min — ABNORMAL LOW (ref >60)
Glucose, Bld: 109 mg/dL — ABNORMAL HIGH (ref 70–99)
Potassium: 3.9 mmol/L (ref 3.5–5.1)
Sodium: 133 mmol/L — ABNORMAL LOW (ref 135–145)

## 2021-10-24 LAB — CBC
HCT: 31.5 % — ABNORMAL LOW (ref 36.0–46.0)
Hemoglobin: 10.3 g/dL — ABNORMAL LOW (ref 12.0–15.0)
MCH: 30.4 pg (ref 26.0–34.0)
MCHC: 32.7 g/dL (ref 30.0–36.0)
MCV: 92.9 fL (ref 80.0–100.0)
Platelets: 181 10*3/uL (ref 150–400)
RBC: 3.39 MIL/uL — ABNORMAL LOW (ref 3.87–5.11)
RDW: 15.4 % (ref 11.5–15.5)
WBC: 7.9 10*3/uL (ref 4.0–10.5)
nRBC: 0 % (ref 0.0–0.2)

## 2021-10-24 MED ORDER — HYDROCODONE-ACETAMINOPHEN 5-325 MG PO TABS
1.0000 | ORAL_TABLET | Freq: Four times a day (QID) | ORAL | Status: DC | PRN
  Administered 2021-10-24 – 2021-10-25 (×3): 1 via ORAL
  Filled 2021-10-24 (×3): qty 1

## 2021-10-24 NOTE — Progress Notes (Addendum)
1650 - Have confirmed with [facility] home health they can see patient for follow-up in the home on Sunday, if patient medically stable for discharge please discharge on Saturday 11/5 so [facility] can follow-up accordingly.   According to progress notes and the prescription form, PleurX drainage schedule is every other day.    PleurX Discharge Teaching  Spent some time with Mrs. Deines this afternoon to teach her how to drain her pleural effusion via her Left PleurX catheter (placed yesterday 11/3 - 800 cc drainage yesterday per report from primary nurse today).  Mrs. Valli is able to perform some of the procedure with prompting, however, she has chronic numbness in her left hand which makes it difficult for her to perform key parts of the procedure and she would be at risk for dropping parts of the sterile kit if she did the procedure herself.  550ccs of drainage obtained when patient reported too much pain to continue. Procedure was stopped and a new dressing was applied to the PleurX catheter site by me. I reported the patient's pain to the primary nurse.  A small 5cm skin injury noted left posteriolateral chest likely MARSI from removal of tegaderm dressing. I applied a protective skin foam to this and used skin protectant on her skin prior to applying a new tegaderm dressing.   She says that her daughter or husband would be able to help, neither were present during my visit, however, she has orders for a home health nurse who could do further education with the family.

## 2021-10-24 NOTE — Progress Notes (Signed)
PROGRESS NOTE    Jillian Cooley   DEY:[id number]  DOB: 1932-03-23  PCP: [name], MD    DOA: 10/10/2021 LOS: 108    Brief Narrative / Hospital Course to Date:   87 y.o. F with dCHF on home O2 PRN, recent Dx MM not yet started chemo, DVT and PE after rotator cuff surgery last May, on apixaban, as well as recurrent transudative pleural effusion (see below) who presented with slowly progressive SOB, finally severe.  Required BiPAP initially.  Was found to have a large left-sided pleural effusion.  Was admitted to the intensive care unit.  Underwent thoracentesis x2 during this hospital stay.  Seen by cardiology as well.  Underwent right heart catheterization.  Subsequently underwent TEE that showed moderate/severe mitral regurg.  Then underwent cardiac MRI that only moderate mitral regur .  Repeat echocardiogram on 10/22/2021 showed moderate pleural effusion, no significant pericardial effusion.    Assessment & Plan   Principal Problem:   Acute on chronic respiratory failure with hypoxia (HCC) Active Problems:   Mitral valve insufficiency   Essential hypertension   Bilateral pulmonary embolism (HCC)   Pleural effusion   Normocytic anemia   Multiple myeloma not having achieved remission (HCC)   Pressure injury of coccygeal region, stage 2 (HCC)   Acute on chronic diastolic CHF (congestive heart failure) (HCC)   Severe mitral regurgitation   PAH (pulmonary artery hypertension) (HCC)   Non-ST elevation (NSTEMI) myocardial infarction ([place])   Acute renal failure superimposed on stage 4 chronic kidney disease (HCC)   Hypotension, unspecified   Hyperphosphatemia   Hypokalemia   Mobitz type 1 second degree AV block   Protein-calorie malnutrition, severe   Pericardial effusion   Acute on chronic respiratory failure with hypoxia Secondary to CHF along with pleural effusion.   Uses oxygen PRN at home.   After thoracentesis, was weaned off oxygen.   Doing well on room  air. --Supplement O2 if needed to keep sats >90%  Left-sided pleural effusion, recurrent -  Initially diagnosed in February.  Seen by pulmonology.  Has undergone multiple thoracentesis including during this hospital stay.  Patient underwent thoracentesis on 10/28.  Fluid analysis is consistent with a transudative process.  Thought to be secondary to cardiac process vs malignancy(?).   Chest x-ray was repeated on 10/29 and showed atelectasis.  No significant pleural effusion was noted.  Chest x-ray was repeated 11/2 by cardiology which shows an increase in the left-sided pleural effusion.  Changes suggestive of atelectasis versus pneumonitis noticed.  No history suggestive of infectious process.  Likely atelectasis.   --incentive spirometer.   --Pulmonology consulted --PleurX catheter placed 11/3 --pain control - Norco PRN ordered     Acute on chronic diastolic CHF NSTEMI Mobitz type I AV block Has been on and off of diuretics during this admission, mainly based on renal functionand symptoms.   Transitioned to oral furosemide 80 mg BID, then reduced to 80 mg daily on 10-20-2021.   Continue to monitor urine output.   Strict ins and outs and daily weight.    Elevated troponin levels unlikely due to ACS but demand ischemia.   --Continue aspirin and statin.   --Not noted to be on beta-blocker at this time.   --Not on ACE inhibitor or ARB due to elevated creatinine.  Mitral regurgitation - cardiac MRI suggest moderate mitral regurgitation.  TEE had suggested severe mitral regurgitation.  Mgmt per cardiology.  Cardiology does not think mitral regurg is the cause of pt's recurrent  pleural effusion.  AKI superimposed on CKD stage IV Hyponatremia Baseline creatinine is around 1.8-2.1.   Cr increased up to 2.7, now improving.  Dose of Lasix was decreased to 80 mg daily on 10-20-2021.   May have to tolerate a higher level of creatinine in this patient with CHF requiring diuretics.   Sodium  level has improved 134.  --Continue with lasix 80 mg daily. --Monitor renal function   History of bilateral Pe's - diagnosed in May this year. Resumed on Eliquis.   Treated with heparin until procedures completed. Respiratory status is stable.   Should not need aspirin at discharge.  Pulmonary artery hypertension -  Cardiology and pulmonology has been following.  Essential hypertension Orthostatic hypotension Orthostatic drop in blood pressure noted when she worked with PT on 11/1, suspect due to her multiple comorbidities and heart conditions along with the use of diuretics.   Pt advised to be cautious when getting up.   TED stockings may also help.   Remains on furosemide.  Hydralazine stopped due to orthostatic hypotension on 10-21-2021.    Hypokalemia - replaced. Monitor and replace PRN.  History of multiple myeloma - Followed by [name] with oncology.  Has not been started on treatment yet due to her other comorbidities and acute issues.  Patient has an appointment with [name] on 10/23/2021 at 2:40 PM.  To be rescheduled.   Normocytic anemia - Stable.   No evidence of overt bleeding. Monitor CBC   Severe protein calorie malnutrition Etiology: chronic illness Signs/Symptoms: severe fat depletion, severe muscle depletion Interventions: Magic cup, MVI  Patient BMI: Body mass index is 18.9 kg/m.   DVT prophylaxis: Place TED hose Start: 10/21/21 1146 SCD's Start: 10/13/21 2340 apixaban (ELIQUIS) tablet 5 mg   Diet:  Diet Orders (From admission, onward)     Start     Ordered   10/22/21 1257  Diet regular Room service appropriate? Yes; Fluid consistency: Thin  Diet effective now       Question Answer Comment  Room service appropriate? Yes   Fluid consistency: Thin      10/22/21 1256              Code Status: Full Code   Subjective 10/24/21    Pt seen with son at bedside this AM.  Pt reports significant pain in her left side at the PleurX site, worse  with any movement.  Was finally able to sleep a bit after Norco given last night.  Tylenol this AM was not very helpful.  No other acute complaints besides pain today.   Disposition Plan & Communication   Status is: Inpatient  Remains inpatient appropriate because: PleurX education underway, pain uncontrolled.  Anticipate d/c once cleared by PCCM and pain controlled.    Family Communication: son at bedside on rounds   Consults, Procedures, Significant Events   Consultants:  Cardiology Pulmonology  Procedures:  Thoracenteses 10/21, 10/23, 10/28 PleurX catheter placement 11/3  Antimicrobials:  Anti-infectives (From admission, onward)    Start     Dose/Rate Route Frequency Ordered Stop   10/10/21 1900  cefTRIAXone (ROCEPHIN) 2 g in sodium chloride 0.9 % 100 mL IVPB  Status:  Discontinued        2 g 200 mL/hr over 30 Minutes Intravenous Every 24 hours 10/10/21 1457 10/11/21 1039   10/10/21 1900  azithromycin (ZITHROMAX) 500 mg in sodium chloride 0.9 % 250 mL IVPB  Status:  Discontinued        500 mg  250 mL/hr over 60 Minutes Intravenous Every 24 hours 10/10/21 1457 10/11/21 1015   10/10/21 1045  piperacillin-tazobactam (ZOSYN) IVPB 3.375 g        3.375 g 100 mL/hr over 30 Minutes Intravenous  Once 10/10/21 1034 10/10/21 1255   10/10/21 1030  piperacillin-tazobactam (ZOSYN) IVPB 2.25 g  Status:  Discontinued        2.25 g 100 mL/hr over 30 Minutes Intravenous  Once 10/10/21 1017 10/10/21 1034         Micro    Objective   Vitals:   10/23/21 2020 10/24/21 0012 10/24/21 0429 10/24/21 1416  BP:  106/61 99/61 116/70  Pulse:  93 89 94  Resp:  _0 Temp: 98 F (36.7 C)  97.9 F (36.6 C) 98.7 F (37.1 C)  TempSrc: Oral  Oral Oral  SpO2: 98% 95% 99%   Weight:   48.4 kg   Height:        Intake/Output Summary (Last 24 hours) at 10/24/2021 1521 Last data filed at 10/23/2021 1710 Gross per 24 hour  Intake --  Output 800 ml  Net -800 ml   Filed Weights    10/23/21 1000 10/23/21 1550 10/24/21 0429  Weight: 48.9 kg 48.9 kg 48.4 kg    Physical Exam:  General exam: awake, appears fatigued and mildly uncomfortable, no acute distress Respiratory system: normal respiratory effort, Left Pleurx catheter in place. Cardiovascular system: normal S1/S2, RRR, no pedal edema.   Central nervous system: A&O x4. no gross focal neurologic deficits, normal speech Psychiatry: normal mood, congruent affect, judgement and insight appear normal  Labs   Data Reviewed: I have personally reviewed following labs and imaging studies  CBC: Recent Labs  Lab 10/20/21 0156 10/21/21 0235 10/22/21 0325 10/23/21 0235 10/24/21 0523  WBC 9.7 7.7 9.0 8.3 7.9  HGB 11.1* 10.8* 10.3* 10.7* 10.3*  HCT 34.3* 32.8* 32.3* 32.9* 31.5*  MCV 91.5 90.9 92.6 92.4 92.9  PLT 215 209 200 199 917   Basic Metabolic Panel: Recent Labs  Lab 10/18/21 0152 10/19/21 0253 10/20/21 0156 10/21/21 0235 10/22/21 0325 10/23/21 0235 10/24/21 0523  NA 136   < > 132* 134* 135 134* 133*  K 5.0   < > 4.2 4.1 4.0 3.9 3.9  CL 104   < > 97* 98 99 99 98  CO2 25   < > _1 GLUCOSE 121*   < > 107* 107* 100* 103* 109*  BUN 61*   < > 68* 66* 65* 63* 61*  CREATININE 2.00*   < > 2.28* 2.45* 2.42* 2.29* 2.42*  CALCIUM 8.9   < > 9.1 9.2 9.1 9.0 9.1  MG 2.1  --   --   --   --   --   --    < > = values in this interval not displayed.   GFR: Estimated Creatinine Clearance: 12.5 mL/min (A) (by C-G formula based on SCr of 2.42 mg/dL (H)). Liver Function Tests: No results for input(s): AST, ALT, ALKPHOS, BILITOT, PROT, ALBUMIN in the last 168 hours. No results for input(s): LIPASE, AMYLASE in the last 168 hours. No results for input(s): AMMONIA in the last 168 hours. Coagulation Profile: No results for input(s): INR, PROTIME in the last 168 hours. Cardiac Enzymes: No results for input(s): CKTOTAL, CKMB, CKMBINDEX, TROPONINI in the last 168 hours. BNP (last 3 results) Recent Labs     02/28/21 1109 07/07/21 1500  PROBNP 1,545* 3,761*   HbA1C: No  results for input(s): HGBA1C in the last 72 hours. CBG: No results for input(s): GLUCAP in the last 168 hours. Lipid Profile: No results for input(s): CHOL, HDL, LDLCALC, TRIG, CHOLHDL, LDLDIRECT in the last 72 hours. Thyroid Function Tests: No results for input(s): TSH, T4TOTAL, FREET4, T3FREE, THYROIDAB in the last 72 hours. Anemia Panel: No results for input(s): VITAMINB12, FOLATE, FERRITIN, TIBC, IRON, RETICCTPCT in the last 72 hours. Sepsis Labs: No results for input(s): PROCALCITON, LATICACIDVEN in the last 168 hours.  No results found for this or any previous visit (from the past 240 hour(s)).    Imaging Studies   DG Chest 1 View  Result Date: 10/23/2021 CLINICAL DATA:  Shortness of breath. EXAM: CHEST  1 VIEW COMPARISON:  Chest x-ray dated October 21, 2021. FINDINGS: New left-sided PleurX drainage catheter with decreased small left pleural effusion and mildly improved aeration at the left lung base. Unchanged small right pleural effusion and right basilar atelectasis. New small left apical pneumothorax, less than 5%. The heart size and mediastinal contours are within normal limits. No acute osseous abnormality. IMPRESSION: 1. New left-sided PleurX drainage catheter with decreased small left pleural effusion. 2. New small left apical pneumothorax, less than 5%. 3. Unchanged small right pleural effusion and right basilar atelectasis. Electronically Signed   By: Titus Dubin M.D.   On: 10/23/2021 18:42   DG CHEST PORT 1 VIEW  Result Date: 10/24/2021 CLINICAL DATA:  Chest pain EXAM: PORTABLE CHEST 1 VIEW COMPARISON:  Previous studies including the examination of 10/23/2021 FINDINGS: Transverse diameter of heart is slightly increased. There are no signs of pulmonary edema. Bilateral pleural effusions are seen with no significant interval change. There are patchy infiltrates in the medial aspects of both lower lung  fields, more so on the left side. Catheter is seen in the left pleural space in the left lower lung fields. There is small left apical pneumothorax measuring 1.4 cm in thickness. Left lung remains well expanded. There is no shift of mediastinum. IMPRESSION: Bilateral pleural effusions. There are patchy infiltrates in the lower lung fields, more so on the left side suggesting atelectasis/pneumonia. Small left apical pneumothorax with no significant change. Electronically Signed   By: Elmer Picker M.D.   On: 10/24/2021 11:30     Medications   Scheduled Meds:  apixaban  5 mg Oral BID   atorvastatin  20 mg Oral Daily   feeding supplement  1 Container Oral TID BM   furosemide  80 mg Oral Daily   mouth rinse  15 mL Mouth Rinse BID   multivitamin with minerals  1 tablet Oral Daily   sodium chloride flush  3 mL Intravenous Q12H   Continuous Infusions:  sodium chloride Stopped (10/10/21 1902)       LOS: 14 days    Time spent: 25 minutes with > 50% spent at bedside and in coordination of care    Ezekiel Slocumb, DO [facility]  10/24/2021, 3:21 PM      If 7PM-7AM, please contact night-coverage. How to contact the [facility] Attending or Consulting provider [place] or covering provider during after hours [place], for this patient?    Check the care team in [facility] and look for a) attending/consulting [facility] provider listed and b) the [facility] team listed Log into [email or web address] and use [place]'s universal password to access. If you do not have the password, please contact the hospital operator. Locate the [facility] provider you are looking for under [facility] and page to  a number that you can be directly reached. If you still have difficulty reaching the provider, please page the [facility] (Director on Call) for the Hospitalists listed on amion for assistance.

## 2021-10-24 NOTE — Progress Notes (Signed)
NAME:  Jillian Cooley, MRN:  [id number], DOB:  [birth year], LOS: 75 ADMISSION DATE:  10/10/2021, CONSULTATION DATE:  10/10/2021 REFERRING MD:  F[place]: Respiratory failure, large left pleural effusion, metabolic acidosis   History of Present Illness:  87 year old woman who presented to [facility] ED 10/21 for SOB/dyspnea and hypoxia. PMHx significant for HTN, HLD, NSTEMI (2022), severe MR, pulmonary HTN, acute-on-chronic diastolic HF, DVT/PE (01/5426, on Eliquis), recurrent L pleural effusion, acute-on-chronic hypoxic respiratory failure, multiple myeloma, CKD stage IV.  Patient was scheduled for chemotherapy 10/21 but was found to be hypoxic to 80s (per husband) despite 4L [place]. She required BiPAP. Blood pressures were soft and labs revealed metabolic acidosis with pH 7.1 and LA 4.0. Additionally, patient was noted to have mild AKI on CKD and elevated BNP to 1000. Troponins peaked at 5300, c/f NSTEMI (type II). CXR demonstrated large L pleural effusion.  PCCM called for assistance with management of critical illness and recurrent transudative L pleural effusion.  Pertinent Medical History:   Past Medical History:  Diagnosis Date   Abnormal EKG    Carpal tunnel syndrome    CKD (chronic kidney disease), stage III (HCC)    DDD (degenerative disc disease), lumbar    Hypertension    Hypertension with renal disease    Menopause    Mixed hyperlipidemia    Muscular degeneration    Nonrheumatic aortic (valve) insufficiency    Nonrheumatic mitral valve regurgitation    Osteoarthritis    Pedal edema    Pleural effusion    Skin cancer    Vitamin D deficiency    Significant Hospital Events: Including procedures, antibiotic start and stop dates in addition to other pertinent events   10/21 - Admitted via [facility] ED. On BiPAP. Soft BP, LA 4 with pH 7.1, s/p 1.5L thoracentesis (transudate with LDH 64, cyto pending). SDS, fluids for soft BPs.  IV heparin for NSTEMI. 10/21 left thora  1.5 off 10/22 - seen by cardiology.  Demand ischemia suspected troponin peaked at 5000.  Currently on 4 L oxygen and off BiPAP.  Feeling better after thoracentesis.  Chest x-ray with reduced but residual left pleural effusion.  Currently on bicarbonate infusion and heparin infusion.  Not on pressors. AFebirle an cultur enegative. BP improved 100-119 sbp.,. and dias 50s. 10/23 left thora, 1.2 off 10/24 - RHC today, elevated R and L heart filling pressures, significant MR, normal CO, mixed pulmonary venous/pulmonary arterial HTN. 10/28 left thora, 1.6 off 11/3 pleur-x  Interim History / Subjective:  Complained of pain on the left chest and back overnight No dyspnea  Objective:  Blood pressure 99/61, pulse 89, temperature 97.9 F (36.6 C), temperature source Oral, resp. rate 17, height _0  (1.6 m), weight 48.4 kg, SpO2 99 %.      Estimated body mass index is 18.9 kg/m as calculated from the following:   Height as of this encounter: _1  (1.6 m).   Weight as of this encounter: 48.4 kg.   Intake/Output Summary (Last 24 hours) at 10/24/2021 1052 Last data filed at 10/23/2021 1710 Gross per 24 hour  Intake --  Output 800 ml  Net -800 ml   Filed Weights   10/23/21 1000 10/23/21 1550 10/24/21 0429  Weight: 48.9 kg 48.9 kg 48.4 kg   Physical Examination:  General:  chronically ill appearing female resting comfortably in bed HENT: NCAT OP clear PULM: Diminished on left, clear on right, normal effort CV: RRR, no mgr GI: BS+, soft, nontender  MSK: normal bulk and tone Derm: left pleurx dressing c/d/I, no drainage or bleeding Neuro: awake, alert, no distress, MAEW     Resolved Hospital Problem List:   Severe lactic acidosis and metabolic acidosis at admission.?  Due to cardiac versus renal injury for myeloma versus sepsis  Assessment & Plan:   Chronic recurrent left pleural effusion  Transudative (February 2022, May 2022, September 2022), multiple thoracentesis this  admission S/p pleurx, some pain on 11/4, likely related to insertion > check CXR now > will order outpatient pleural fluid drainage qOD for now given frequency and relative high volume of removal in this admission > f/u with pulmonary in 1-2 weeks  Best Practice (right click and "Reselect all SmartList Selections" daily)  Per [facility]  Roselie Awkward, MD [facility] Pager: [phone] Cell: [phone] After 7:00 pm call Elink  [phone] 10/24/2021, 10:52 AM

## 2021-10-24 NOTE — Progress Notes (Signed)
Nutrition Follow-up  DOCUMENTATION CODES:   Severe malnutrition in context of chronic illness  INTERVENTION:   Continue regular diet. Continue Boost Breeze po TID, each supplement provides 250 kcal and 9 grams of protein. Continue Magic cup TID with meals, each supplement provides 290 kcal and 9 grams of protein. Education provided on low sodium diet.  NUTRITION DIAGNOSIS:   Severe Malnutrition related to chronic illness as evidenced by severe fat depletion, severe muscle depletion.  Ongoing   GOAL:   Patient will meet greater than or equal to 90% of their needs  Progressing  MONITOR:   PO intake, Supplement acceptance, Labs, Weight trends  REASON FOR ASSESSMENT:   Consult Assessment of nutrition requirement/status  ASSESSMENT:   87 year old female with medical history of skin cancer, pleural effusion, HTN, mixed HLD, vitamin D deficiency, DDD, osteoarthritis, carpal tunnel syndrome, and stage 3-4 CKD. She was admitted due to chronic hypoxic respiratory failure d/t large recurrent L pleural effusion, hypotension, acute on chronic CHF, severe MR, pulmonary HTN, and NSTEMI. She underwent left thoracentesis on 10/23 with 1.5 L removed.  S/P pleur-x tube placement 11/3.  Patient still sore from the procedure, so she is not being discharged home today. Remains on Lasix.  Regular diet. Meal intakes documented at 25-50%  Patient reports ongoing poor appetite and intake. She is drinking some Boost Breeze, but not a lot. Encouraged patient to continue to eat and drink supplements throughout the day.   Patient's son states that patient is "the cook" and he is worried that she will not be able to cook for herself when she returns home because she is too weak. He is looking for a meal service to prepare healthy meals ahead of time. Provided "Heart Failure Nutrition Therapy for the Undernourished" handout from the Academy of Nutrition and Dietetics. Discussed good options to  maximize protein and calorie intake while limiting sodium intake.    Labs reviewed. Na 133 Medications reviewed and include Lasix, MVI with minerals.  Admission weight 53.9 kg Current weight 48.4 kg  Diet Order:   Diet Order             Diet regular Room service appropriate? Yes; Fluid consistency: Thin  Diet effective now                   EDUCATION NEEDS:   Education needs have been addressed  Skin:  Skin Assessment: Skin Integrity Issues: Skin Integrity Issues:: Stage I Stage I: sacrum  Last BM:  11/2  Height:   Ht Readings from Last 1 Encounters:  10/23/21 5\' 3"  (1.6 m)    Weight:   Wt Readings from Last 1 Encounters:  10/24/21 48.4 kg    Ideal Body Weight:  52.3 kg  BMI:  Body mass index is 18.9 kg/m.  Estimated Nutritional Needs:   Kcal:  1500-1700 kcal  Protein:  75-85 grams  Fluid:  >/= 1.3 L/day   Lucas Mallow, RD, LDN, CNSC Please refer to Amion for contact information.

## 2021-10-24 NOTE — Progress Notes (Signed)
Physical Therapy Treatment Patient Details Name: Jillian Cooley MRN: [id number] DOB: 1932/12/14 Today's Date: 10/24/2021   History of Present Illness 87 y.o. female admitted 10/10/21 with severe SOB; workup for acute hypoxic respiratory failure requiring bipap. CXR with large L effusion. S/p thoracentesis 10/21, repeat 10/23. [place] 10/24 showed normal CO, high filling pressures. S/p PluerX tunneled pleural catheter placement 11/3. PMH includes CHF (home O2 PRN), DVT/PE s/p rotator cuff repair (04/2021).    PT Comments    Focused session on educating pt on managing her BP through wearing her TED hose and performing leg exercises with transitions, good success noted as pt reported feeling only slightly lightheaded during session, see below. Performed several standing exercises and ambulated in hallway to improve lower extremity strength and endurance. Will continue to follow acutely. Current recommendations remain appropriate.  BP:  98/59 supine 92/58 sitting 91/67 standing starting to ambulate 112/44 during standing exercises *exercises performed throughout transitions to maintain adequate BP measures   Recommendations for follow up therapy are one component of a multi-disciplinary discharge planning process, led by the attending physician.  Recommendations may be updated based on patient status, additional functional criteria and insurance authorization.  Follow Up Recommendations  Home health PT     Assistance Recommended at Discharge Frequent or constant Supervision/Assistance  Equipment Recommendations  Rolling walker (2 wheels)    Recommendations for Other Services       Precautions / Restrictions Precautions Precautions: Fall Precaution Comments: monitor for orthostatics Restrictions Weight Bearing Restrictions: No     Mobility  Bed Mobility Overal bed mobility: Modified Independent Bed Mobility: Supine to Sit;Sit to Supine     Supine to sit: Modified  independent (Device/Increase time);HOB elevated Sit to supine: Modified independent (Device/Increase time);HOB elevated   General bed mobility comments: Pt able to perform all bed mobility aspects with mod I using bed rails with HOB elevated.    Transfers Overall transfer level: Needs assistance Equipment used: Rolling walker (2 wheels) Transfers: Sit to/from Stand Sit to Stand: Supervision           General transfer comment: Supervision for safety, no LOB.    Ambulation/Gait Ambulation/Gait assistance: Supervision Gait Distance (Feet): 88 Feet Assistive device: Rolling walker (2 wheels) Gait Pattern/deviations: Step-through pattern;Decreased stride length;Narrow base of support Gait velocity: Decreased Gait velocity interpretation: <1.31 ft/sec, indicative of household ambulator General Gait Details: Pt with stable, slow gait using RW, no LOB, supervision for safety, fatigues quickly.   Stairs             Wheelchair Mobility    Modified Rankin (Stroke Patients Only)       Balance Overall balance assessment: Needs assistance Sitting-balance support: No upper extremity supported;Feet supported Sitting balance-Leahy Scale: Good     Standing balance support: No upper extremity supported;Bilateral upper extremity supported;During functional activity Standing balance-Leahy Scale: Fair Standing balance comment: Able to stand statically without UE support but prefers UE support with mobility.                            Cognition Arousal/Alertness: Awake/alert Behavior During Therapy: WFL for tasks assessed/performed Overall Cognitive Status: Within Functional Limits for tasks assessed                                          Exercises General Exercises - Lower Extremity Ankle Circles/Pumps: AROM;Both;10  reps;Supine Long Arc Quad: AROM;Both;10 reps;Seated Hip ABduction/ADduction: Strengthening;Both;10 reps;Standing (with RW) Hip  Flexion/Marching: Strengthening;Both;10 reps;Standing (with RW) Heel Raises: Strengthening;Both;10 reps;Standing (with RW) Mini-Sqauts: Strengthening;Both;10 reps;Standing (with RW)    General Comments General comments (skin integrity, edema, etc.): BP: 98/59 supine, 92/58 sitting, 91/67 standing starting to ambulate, 112/44 during standing exercises, exercises performed throughout transitions to maintain adequate BP measures, HR 100-116; educated pt on use of TED hose and exercises to prevent decline in BP; reported slight lightheadedness this date; educated pt on safe stair negotiation and having chairs as needed nearby for safety      Pertinent Vitals/Pain Pain Assessment: 0-10 Pain Score: 3  Pain Location: L side location of drain Pain Descriptors / Indicators: Discomfort;Grimacing;Operative site guarding Pain Intervention(s): Monitored during session;Limited activity within patient's tolerance;Repositioned    Home Living                          Prior Function            PT Goals (current goals can now be found in the care plan section) Acute Rehab PT Goals Patient Stated Goal: to go home PT Goal Formulation: With patient Time For Goal Achievement: 11/01/21 Potential to Achieve Goals: Good Progress towards PT goals: Progressing toward goals    Frequency    Min 3X/week      PT Plan Current plan remains appropriate;Other (comment)    Co-evaluation              AM-PAC PT "6 Clicks" Mobility   Outcome Measure  Help needed turning from your back to your side while in a flat bed without using bedrails?: None Help needed moving from lying on your back to sitting on the side of a flat bed without using bedrails?: None Help needed moving to and from a bed to a chair (including a wheelchair)?: A Little Help needed standing up from a chair using your arms (e.g., wheelchair or bedside chair)?: A Little Help needed to walk in hospital room?: A Little Help  needed climbing 3-5 steps with a railing? : A Little 6 Click Score: 20    End of Session   Activity Tolerance: Patient tolerated treatment well Patient left: in bed;with call bell/phone within reach;with bed alarm set Nurse Communication: Mobility status PT Visit Diagnosis: Other abnormalities of gait and mobility (R26.89);Muscle weakness (generalized) (M62.81);Unsteadiness on feet (R26.81);Difficulty in walking, not elsewhere classified (R26.2);Dizziness and giddiness (R42)     Time: 9024-0973 PT Time Calculation (min) (ACUTE ONLY): 20 min  Charges:  $Therapeutic Activity: 8-22 mins                     Moishe Spice, PT, DPT Acute Rehabilitation Services  Pager: [phone] Office: [place] 10/24/2021, 1:11 PM

## 2021-10-24 NOTE — Progress Notes (Signed)
Pharmacist Chemotherapy Monitoring - Initial Assessment    Anticipated start date: 10/31/21   The following has been reviewed per standard work regarding the patient's treatment regimen: [name] diagnosis, treatment plan and drug doses, and organ/hematologic function Lab orders and baseline tests specific to treatment regimen  The treatment plan start date, drug sequencing, and pre-medications Prior authorization status  Patient's documented medication list, including drug-drug interaction screen and prescriptions for anti-emetics and supportive care specific to the treatment regimen The drug concentrations, fluid compatibility, administration routes, and timing of the medications to be used [name] access for treatment and lifetime cumulative dose history, if applicable  [name] medication allergies and previous infusion related reactions, if applicable   Changes made to treatment plan:  treatment plan date  Follow up needed:  N/A   Judge Stall, [place], 10/24/2021  11:09 AM

## 2021-10-24 NOTE — Progress Notes (Addendum)
Progress Note  Patient Name: Jillian Cooley Date of Encounter: 10/24/2021  [facility] Cardiologist: Werner Lean, MD   Subjective   Denies any chest pain or dyspnea.  PleurX catheter placed yesterday,  Inpatient Medications    Scheduled Meds:  apixaban  5 mg Oral BID   atorvastatin  20 mg Oral Daily   feeding supplement  1 Container Oral TID BM   furosemide  80 mg Oral Daily   mouth rinse  15 mL Mouth Rinse BID   multivitamin with minerals  1 tablet Oral Daily   sodium chloride flush  3 mL Intravenous Q12H   Continuous Infusions:  sodium chloride Stopped (10/10/21 1902)   PRN Meds: sodium chloride, acetaminophen, docusate sodium, HYDROcodone-acetaminophen, ondansetron (ZOFRAN) IV, polyethylene glycol   Vital Signs    Vitals:   10/23/21 1731 10/23/21 2020 10/24/21 0012 10/24/21 0429  BP: 118/64 109/62 106/61 99/61  Pulse: 99 88 93 89  Resp: (!) $RemoveB'21 20 17 17  'yGRslWuu$ Temp: 97.9 F (36.6 C) 98 F (36.7 C)  97.9 F (36.6 C)  TempSrc: Oral Oral  Oral  SpO2: 96% 98% 95% 99%  Weight:    48.4 kg  Height:        Intake/Output Summary (Last 24 hours) at 10/24/2021 1108 Last data filed at 10/23/2021 1710 Gross per 24 hour  Intake --  Output 800 ml  Net -800 ml    Last 3 Weights 10/24/2021 10/23/2021 10/23/2021  Weight (lbs) 106 lb 11.2 oz 107 lb 12.9 oz 107 lb 11.2 oz  Weight (kg) 48.4 kg 48.9 kg 48.852 kg      Telemetry    Normal sinus rhythm rate 90s Personally Reviewed  ECG    No new EKG to review- Personally Reviewed  Physical Exam  GEN: Thin and frail appearing HEENT: Normal NECK: No JVD LYMPHATICS: No lymphadenopathy CARDIAC:RRR, no murmurs, rubs, gallops RESPIRATORY:  diminished at left base ABDOMEN: Soft, non-tender, non-distended MUSCULOSKELETAL:  No edema; No deformity  SKIN: Warm and dry NEUROLOGIC:  Alert and oriented x 3 PSYCHIATRIC:  Normal affect   Labs    High Sensitivity Troponin:   Recent Labs  Lab 10/10/21 0731  10/10/21 0935 10/10/21 1548 10/10/21 1822 10/12/21 0252  TROPONINIHS 523* 836* 3,307* 5,378* 5,247*      Chemistry Recent Labs  Lab 10/18/21 0152 10/19/21 0253 10/22/21 0325 10/23/21 0235 10/24/21 0523  NA 136   < > 135 134* 133*  K 5.0   < > 4.0 3.9 3.9  CL 104   < > 99 99 98  CO2 25   < > $R'26 27 28  'TH$ GLUCOSE 121*   < > 100* 103* 109*  BUN 61*   < > 65* 63* 61*  CREATININE 2.00*   < > 2.42* 2.29* 2.42*  CALCIUM 8.9   < > 9.1 9.0 9.1  MG 2.1  --   --   --   --   GFRNONAA 24*   < > 19* 20* 19*  ANIONGAP 7   < > $R'10 8 7   'ay$ < > = values in this interval not displayed.     Lipids  No results for input(s): CHOL, TRIG, HDL, LABVLDL, LDLCALC, CHOLHDL in the last 168 hours.   Hematology Recent Labs  Lab 10/22/21 0325 10/23/21 0235 10/24/21 0523  WBC 9.0 8.3 7.9  RBC 3.49* 3.56* 3.39*  HGB 10.3* 10.7* 10.3*  HCT 32.3* 32.9* 31.5*  MCV 92.6 92.4 92.9  MCH 29.5 30.1 30.4  MCHC 31.9 32.5 32.7  RDW 15.7* 15.6* 15.4  PLT 200 199 181    Thyroid No results for input(s): TSH, FREET4 in the last 168 hours.  BNP No results for input(s): BNP, PROBNP in the last 168 hours.   DDimer No results for input(s): DDIMER in the last 168 hours.   Radiology    DG Chest 1 View  Result Date: 10/23/2021 CLINICAL DATA:  Shortness of breath. EXAM: CHEST  1 VIEW COMPARISON:  Chest x-ray dated October 21, 2021. FINDINGS: New left-sided PleurX drainage catheter with decreased small left pleural effusion and mildly improved aeration at the left lung base. Unchanged small right pleural effusion and right basilar atelectasis. New small left apical pneumothorax, less than 5%. The heart size and mediastinal contours are within normal limits. No acute osseous abnormality. IMPRESSION: 1. New left-sided PleurX drainage catheter with decreased small left pleural effusion. 2. New small left apical pneumothorax, less than 5%. 3. Unchanged small right pleural effusion and right basilar atelectasis.  Electronically Signed   By: Titus Dubin M.D.   On: 10/23/2021 18:42   ECHOCARDIOGRAM LIMITED  Result Date: 10/22/2021    ECHOCARDIOGRAM LIMITED REPORT   Patient Name:   Jillian Cooley Date of Exam: 10/22/2021 Medical Rec #:  [id number]             Height:       63.0 in Accession #:    [id number]            Weight:       110.5 lb Date of Birth:  December 29, 1931             BSA:          1.503 m Patient Age:    87 years              BP:           95/65 mmHg Patient Gender: F                     HR:           84 bpm. Exam Location:  Inpatient Procedure: 2D Echo, Cardiac Doppler and Limited Color Doppler Indications:    lmtd for pericardial effusion  History:        Patient has prior history of Echocardiogram examinations, most                 recent 10/10/2021. Previous Myocardial Infarction, Pulmonary                 HTN, Mitral Valve Disease; Risk Factors:Hypertension. Pulmonary                 Embolism                 Pericardial effusion.  Sonographer:    Glo Herring Referring Phys: [id number] [place]  Conclusion(s)/Recommendation(s): Normal LV size and function. Moderate pleural effusion. No significant pericardial effusion. Normal IVC. LEFT VENTRICLE PLAX 2D LVIDd:         3.00 cm LVIDs:         2.10 cm LV PW:         1.30 cm LV IVS:        1.30 cm  IVC IVC diam: 1.70 cm LEFT ATRIUM         Index LA diam:    3.80 cm 2.53 cm/m Phineas Inches Electronically signed by Phineas Inches Signature Date/Time: 10/22/2021/7:50:28 PM  Final     Cardiac Studies   Echocardiogram 10/10/2021      1. Normal LV function; mild AI; severe MR; elevated mean gradient across  MV of 5 mmHg likely at least partially related to severe MR; moderate TR.   2. Left ventricular ejection fraction, by estimation, is 60 to 65%. The  left ventricle has normal function. The left ventricle has no regional  wall motion abnormalities. Left ventricular diastolic parameters are  indeterminate.   3. Right ventricular  systolic function is normal. The right ventricular  size is normal. There is moderately elevated pulmonary artery systolic  pressure.   4. Left atrial size was moderately dilated.   5. Large pleural effusion in the left lateral region.   6. The mitral valve is abnormal. Severe mitral valve regurgitation. Mild  mitral stenosis.   7. Tricuspid valve regurgitation is moderate.   8. The aortic valve has an indeterminant number of cusps. Aortic valve  regurgitation is mild. No aortic stenosis is present.   9. The inferior vena cava is normal in size with <50% respiratory  variability, suggesting right atrial pressure of 8 mmHg.        Right heart catheterization 10/14/2021   Conclusion   1. Right and left heart filling pressures are elevated.  2. Prominent v-wave suggestive of significant mitral regurgitation.  3. Cardiac output is normal.  4. Mixed pulmonary venous/pulmonary arterial HTN   Right Heart   Right Heart Pressures RHC Procedural Findings: Hemodynamics (mmHg) RA mean 11 RV 66/9 PA 74/29, mean 49 PCWP mean 25, prominent V-waves to 37  Oxygen saturations: PA 62% AO 100%  Cardiac Output (Fick) 4.1  Cardiac Index (Fick) 2.6 PVR 5.8 WU      Patient Profile     87 y.o. female with multiple myeloma, HTN, HLD, valvular heart disease (at least moderate, probably severe MR), CKD stage 4, carpal tunnel, bilateral DVT/PE, pleural effusion s/p repeated thoracentesis admitted 10/21 with hypoxic respiratory failure and hypotension, lactic acidosis, elevated troponin, treated with IVF and stress dose steroids. She underwent L sided thoracentesis on 10/21 (1.5L) and repeat L sided thoracentesis on 10/23 (1.2L). Cytology -ve for malignancy.  Right heart catheterization performed 10/13/2021 showed mixed findings of acute left heart failure and intrinsic pulmonary artery hypertension (consider secondary to pulm embolism).  Transferred to [facility] for TEE to assess if MitraClip is an option,  possibly involve HF team if not.  Assessment & Plan    Acute on chronic diastolic heart failure: Echo 10/10/2021 with normal LV function, mild AI, severe MR, moderate TR.  [place] 10/13/2021 with RA 11, RV 66/9, PA 74/29/49, PCWP 25 with prominent V waves to 37, PA sat 62%, CI 2.6.  TEE 10/28 with thickened mitral valve with restricted leaflet motion and can measure all mall coaptation, severe MR, mild MS, EF 60 to 65%, mild RV dysfunction, mild AI. -On review of the TEE, I think the MR appears more moderate, 3D VCA totaled 0.19cm^2 for the 2 jets.  In addition there is no murmur on exam. Given discordant parameters on TEE, recommended cardiac MRI to quantify MR severity, which showed moderate MR (regurgitant fraction 21%)   -Given her multiple myeloma, cardiac MRI was also done to evaluate for evidence of amyloidosis.  Cardiac MRI does show some features of amyloid (LVH, elevated native T1) though nonspecific findings as unable to give contrast due to renal function, so not able to evaluate ECV or LGE to confirm amyloidosis  -Was started on hydralazine,  held due to soft BP -Have avoided ACE/ARB given renal dysfunction -Creatinine trending up and worsening hyponatremia, decreased Lasix to 80 mg daily 10/31, renal function has stabilized.  Would continue Lasix 80 mg daily  Mitral insufficiency: Described as moderate on outside echocardiogram performed in February (unable to review images), moderate (but possibly underestimated) by echo in May.  TEE concerning for severe MR though there were discordant parameters as above (3D VCA 0.19cm^2); cardiac MRI shows moderate MR (regurgitant fraction 21%)  PAH: Mixed etiology due to diastolic heart failure and intrinsic pulmonary arterial disease (possibly sequelae of previous pulmonary embolism).  Note that she had evidence of moderate to severe pulmonary hypertension by echocardiogram performed in February. -Continue diuretics with Lasix 80 mg daily  Acute on  chronic respiratory failure with hypoxia: Improved with thoracentesis and diuresis, but pleural effusion re-accumulated fast.  -Chest x-ray 10/29 showed slightly increased atelectasis versus airspace disease in the left lower lung and little significant change in bilateral effusions with right lower lung consolidation/atelectasis.  Chest x-ray 11/1 with bilateral pleural effusions with interval increase in left pleural effusion.   -Pulmonology reconsulted, recommending Pleurx catheter, placed 11/3 -Continue p.o. Lasix 80 mg daily  NSTEMI: She does not have any regional wall motion abnormalities on the echocardiogram, but it is quite possible that she has underlying CAD based on the degree of increase in cardiac enzymes.   -She is not a candidate for invasive angiography and percutaneous revascularization or surgery.   -It is quite reasonable to treat her with aggressive lipid-lowering therapy.   -Stopped ASA since starting Eliquis   PE: Although this occurred in the setting of postoperative state, it is very likely that she has a hypercoagulable condition due to the hematological disorder.   -Transition from heparin to Eliquis since no further procedures planned -Probably will remain on lifelong anticoagulation.  AKI on Chronic kidney disease stage IV: Creatinine stable at 2.4 today  Multiple myeloma: to discuss plans to resume therapy w her Oncologist.  Pericardial effusion: small to moderate on CMR 10/31, did not have effusion on prior echo.  Repeated limited echo 11/2, no significant pericardial effusion  [facility] will sign off.   Medication Recommendations:  Lasix 80 mg daily, Eliquis $RemoveBefore'5mg'FreubUImSauSp$  BID  Other recommendations (labs, testing, etc):  BMET/CBC in 1 week Follow up as an outpatient:  Will schedule     For questions or updates, please contact [place] Please consult [email or web address] for contact info under        Signed, Donato Heinz, MD  10/24/2021, 11:08 AM

## 2021-10-24 NOTE — Progress Notes (Signed)
Mobility Specialist Progress Note    10/24/21 1213  Mobility  Activity Ambulated in hall  Level of Assistance Standby assist, set-up cues, supervision of patient - no hands on  Assistive Device Front wheel walker  Distance Ambulated (ft) 70 ft  Mobility Ambulated with assistance in hallway  Mobility Response Tolerated well  Mobility performed by Mobility specialist  $Mobility charge 1 Mobility   Pre-Mobility: 89/55 BP  Pt received in bed and agreeable. Feeling a little weak when sitting up and took time to get her bearings. No complaints on walk. Returned to bed with call bell in reach.   Hildred Alamin Mobility Specialist  Mobility Specialist Phone: [phone]

## 2021-10-24 NOTE — Progress Notes (Signed)
TRH night cross cover note:  As notified by RN patient's complaint left flank pain following please Pleurx catheter on 10/23/2021 for recurrent pleural effusion.  She received Tylenol for this discomfort, but reported residual pain requesting additional pharmacologic intervention.  In the setting of hospital course including acute kidney injury on CKD as well as acute on chronic diastolic heart failure, will refrain from Toradol and other NSAIDs at this time.  Consequently, I placed a one-time order for Norco 5/325 mg p.o.  Of note, her left-sided flank discomfort is not associate with any frank chest pain or shortness of breath. Close monitoring of ensuing blood pressure response to this single dose of norco.      Babs Bertin, DO Hospitalist

## 2021-10-25 LAB — BASIC METABOLIC PANEL
Anion gap: 8 (ref 5–15)
BUN: 61 mg/dL — ABNORMAL HIGH (ref 8–23)
CO2: 28 mmol/L (ref 22–32)
Calcium: 9.1 mg/dL (ref 8.9–10.3)
Chloride: 97 mmol/L — ABNORMAL LOW (ref 98–111)
Creatinine, Ser: 2.26 mg/dL — ABNORMAL HIGH (ref 0.44–1.00)
GFR, Estimated: 20 mL/min — ABNORMAL LOW (ref >60)
Glucose, Bld: 117 mg/dL — ABNORMAL HIGH (ref 70–99)
Potassium: 4.3 mmol/L (ref 3.5–5.1)
Sodium: 133 mmol/L — ABNORMAL LOW (ref 135–145)

## 2021-10-25 MED ORDER — ATORVASTATIN CALCIUM 20 MG PO TABS: 20.0000 mg | ORAL_TABLET | Freq: Every day | ORAL | 1 refills | Status: AC

## 2021-10-25 MED ORDER — FUROSEMIDE 80 MG PO TABS: 80.0000 mg | ORAL_TABLET | Freq: Every day | ORAL | 1 refills | Status: AC

## 2021-10-25 MED ORDER — HYDROCODONE-ACETAMINOPHEN 5-325 MG PO TABS
1.0000 | ORAL_TABLET | Freq: Four times a day (QID) | ORAL | 0 refills | Status: AC | PRN
Start: 2021-10-25 — End: ?

## 2021-10-25 NOTE — TOC Transition Note (Signed)
Transition of Care [facility]) - CM/SW Discharge Note   Patient Details  Name: Neriyah Cercone MRN: [id number] Date of Birth: 05-31-1932  [facility] CM/SW Contact:  Konrad Penta, RN Phone Number: [phone] 10/25/2021, 9:44 AM   Clinical Narrative:   Confirmed with weekday RNCM that [place] form was faxed with confirmation. Patient has 2 boxes of canisters at bedside along with rolling walker. Cory with [facility] is aware of transition today. [place] RN to visit patient tomorrow 10/26/21 for pleurex drain and family teaching.   Patient states family will pick her up today. No further needs assessed. Writer spoke with RN and MD.    Final next level of care: [place] Barriers to Discharge: No Barriers Identified   Patient Goals and CMS Choice Patient states their goals for this hospitalization and ongoing recovery are:: return home CMS Medicare.gov Compare Post Acute Care list provided to:: Patient Choice offered to / list presented to : Adult Children (No preference- in network with insurance.)  Discharge Placement                       Discharge Plan and Services In-house Referral: NA Discharge Planning Services: CM Consult            DME Arranged: Walker rolling DME Agency: [employer] Date DME Agency Contacted: 10/23/21 Time DME Agency Contacted: [phone] Representative spoke with at DME Agency: Freda Munro HH Arranged: RN, PT, OT [facility] Agency: [employer] ([place]) Date [place]: 10/23/21 Time [place]: 1300 Representative spoke with at [place]: [place] ([place]) Interventions     Readmission Risk Interventions Readmission Risk Prevention Plan 10/23/2021  Transportation Screening Complete  Medication Review Press photographer) Complete  PCP or Specialist appointment within 3-5 days of discharge Complete  HRI or [place] Complete  SW Recovery Care/Counseling  Consult Complete  [place] Not Applicable  Some recent data might be hidden

## 2021-10-25 NOTE — Discharge Summary (Signed)
Physician Discharge Summary  Jillian Cooley [facility]:[id number] DOB: 1932-10-04 DOA: 10/10/2021  PCP: Georgianne Fick, MD  Admit date: 10/10/2021 Discharge date: 10/25/2021  Admitted From: home Disposition:  home  Recommendations for Outpatient Follow-up:  Follow up with PCP in 1-2 weeks Please obtain BMP/CBC in one week Please follow up with Pulmonology in 1-2 weeks to check PleurX catheter Please follow up with Oncology as scheduled  Home Health: PT, OT, RN  Equipment/Devices:  rolling walker  Discharge Condition: Stable  CODE STATUS: Full Diet recommendation: Regular     Discharge Diagnoses: Principal Problem:   Acute on chronic respiratory failure with hypoxia (HCC) Active Problems:   Mitral valve insufficiency   Essential hypertension   Bilateral pulmonary embolism (HCC)   Pleural effusion   Normocytic anemia   Multiple myeloma not having achieved remission (HCC)   Pressure injury of coccygeal region, stage 2 (HCC)   Acute on chronic diastolic CHF (congestive heart failure) (HCC)   Severe mitral regurgitation   PAH (pulmonary artery hypertension) (HCC)   Non-ST elevation (NSTEMI) myocardial infarction (HCC)   Acute renal failure superimposed on stage 4 chronic kidney disease (HCC)   Hypotension, unspecified   Hyperphosphatemia   Hypokalemia   Mobitz type 1 second degree AV block   Protein-calorie malnutrition, severe   Pericardial effusion    Summary of HPI and Hospital Course:    87 y.o. F with dCHF on home O2 PRN, recent Dx MM not yet started chemo, DVT and PE after rotator cuff surgery last May, on apixaban, as well as recurrent transudative pleural effusion (see below) who presented with slowly progressive SOB, finally severe.  Required BiPAP initially.  Was found to have a large left-sided pleural effusion.  Was admitted to the intensive care unit.  Underwent thoracentesis x3 during this hospital stay.  Seen by cardiology as well.  Underwent right  heart catheterization.  Subsequently underwent TEE that showed moderate/severe mitral regurg.  Then underwent cardiac MRI that only moderate mitral regurgitation.   Repeat echocardiogram on 10/22/2021 showed moderate pleural effusion, no significant pericardial effusion.     Acute on chronic respiratory failure with hypoxia Secondary to CHF along with pleural effusion.   Uses oxygen PRN at home.   After thoracentesis, was weaned off oxygen.   Doing well on room air. Monitor closely.  Left-sided pleural effusion, recurrent -  Initially diagnosed in February.  Seen by pulmonology.  Has undergone multiple thoracentesis including during this hospital stay.  Patient underwent thoracentesis on 10/28.  Fluid analysis is consistent with a transudative process.  Thought to be secondary to cardiac process vs malignancy(?).   Chest x-ray was repeated on 10/29 and showed atelectasis.  No significant pleural effusion was noted.  Chest x-ray was repeated 11/2 by cardiology which shows an increase in the left-sided pleural effusion.  Changes suggestive of atelectasis versus pneumonitis noticed.  No history suggestive of infectious process.  Likely atelectasis.   --incentive spirometer.   --Pulmonology consulted --PleurX catheter placed 11/3 --pain control - Norco PRN ordered Close follow up with pulmonology. Home health RN arranged to assist with management at home.     Acute on chronic diastolic CHF NSTEMI Mobitz type I AV block Has been on and off of diuretics during this admission, mainly based on renal functionand symptoms.   Transitioned to oral furosemide 80 mg BID, then reduced to 80 mg daily on 10-20-2021.   Continue to monitor urine output.   Strict ins and outs and daily weight.  Elevated troponin levels unlikely due to ACS but demand ischemia.   No active chest pain / angina, no acute ischemic changes on EKG. --Continue aspirin and statin.   --Not noted to be on beta-blocker at this  time.   --Not on ACE inhibitor or ARB due to elevated creatinine.  Mitral regurgitation - cardiac MRI suggest moderate mitral regurgitation.  TEE had suggested severe mitral regurgitation.  Mgmt per cardiology.   AKI superimposed on CKD stage IV Hyponatremia Baseline creatinine is around 1.8-2.1.   Cr increased up to 2.7, now improving.  Dose of Lasix was decreased to 80 mg daily on 10-20-2021.   May have to tolerate a higher level of creatinine in this patient with CHF requiring diuretics.   Sodium level has improved 134.  --Continue with lasix 80 mg daily. --Monitor renal function   History of bilateral Pe's - diagnosed in May this year. Resumed on Eliquis.   Treated with heparin until procedures completed. Respiratory status is stable.   Should not need aspirin at discharge.  Pulmonary artery hypertension -  Cardiology and pulmonology has been following.  Essential hypertension Orthostatic hypotension Orthostatic drop in blood pressure noted when she worked with PT on 11/1, suspect due to her multiple comorbidities and heart conditions along with the use of diuretics.   Pt advised to be cautious when getting up.   TED stockings recommended.   Remains on furosemide.  Hydralazine stopped due to orthostatic hypotension on 10-21-2021.    Hypokalemia - replaced. Monitor and replace PRN.  History of multiple myeloma - Followed by [name] with oncology.  Has not been started on treatment yet due to her other comorbidities and acute issues.  Patient had an appointment with [name] on 10/23/2021 at 2:40 PM.   To be rescheduled.   Normocytic anemia - Stable.   No evidence of overt bleeding. Monitor CBC   Severe protein calorie malnutrition Etiology: chronic illness Signs/Symptoms: severe fat depletion, severe muscle depletion Interventions: Magic cup, MVI    Discharge Instructions   Discharge Instructions     (HEART FAILURE PATIENTS) Call MD:  Anytime you have any of  the following symptoms: 1) 3 pound weight gain in 24 hours or 5 pounds in 1 week 2) shortness of breath, with or without a dry hacking cough 3) swelling in the hands, feet or stomach 4) if you have to sleep on extra pillows at night in order to breathe.   Complete by: As directed    Ambulatory Pleural Drainage Schedule   Complete by: As directed    Drain daily, up to max of 1L until patient is only able to drain out 146ml. If <117ml for 3 consecutive drains every other day, then call Interventional Radiology [phone]) for evaluation and possible removal.   Call MD for:   Complete by: As directed    Concerns or problems with PleurX catheter.  If having worsening shortness of breath, especially if this happens despite draining fluid.  Chest Pain   Call MD for:  extreme fatigue   Complete by: As directed    Call MD for:  persistant dizziness or light-headedness   Complete by: As directed    Call MD for:  persistant nausea and vomiting   Complete by: As directed    Call MD for:  severe uncontrolled pain   Complete by: As directed    Call MD for:  temperature >100.4   Complete by: As directed    Diet - low sodium heart  healthy   Complete by: As directed    Discharge instructions   Complete by: As directed    PLEUR-X Drain -- recommend draining from the catheter every other day for now.  If you are feeling more short of breath between draining, okay to go to once daily.  Pulmonology wants to see you for follow up in 1-2 weeks in clinic to check the drain site.  BLOOD PRESSURE - please see your medication list.  Losartan is stopped for right now because your BP has been a little low at times and normal while this has been held in the hospital.  We also held losartan due to your kidney numbers being elevated.  Please follow up with your Primary Care and/or Cardiologist.    We changed your "statin" medication to atorvastatin because it has less effect on the kidneys.     ONCOLOGY --  please contact your oncologist's office on Monday if you have not already had your appointments with them rescheduled.   Discharge wound care:   Complete by: As directed    Change dressing at PleurX catheter site with each draining.   Increase activity slowly   Complete by: As directed       Allergies as of 10/25/2021       Reactions   Sulfa Antibiotics Nausea Only        Medication List     STOP taking these medications    amLODipine 5 MG tablet Commonly known as: NORVASC   losartan 25 MG tablet Commonly known as: COZAAR   simvastatin 20 MG tablet Commonly known as: ZOCOR       TAKE these medications    acyclovir 400 MG tablet Commonly known as: ZOVIRAX Take 1 tablet (400 mg total) by mouth 2 (two) times daily.   atorvastatin 20 MG tablet Commonly known as: LIPITOR Take 1 tablet (20 mg total) by mouth daily.   CALTRATE 600+D3 PO Take 1 tablet by mouth daily with breakfast.   dexamethasone 4 MG tablet Commonly known as: DECADRON Take 5 tablets (20 mg total) by mouth once a week.   Eliquis 5 MG Tabs tablet Generic drug: apixaban TAKE 1 TABLET BY MOUTH TWICE A DAY   ferrous sulfate 325 (65 FE) MG EC tablet Take 1 tablet (325 mg total) by mouth daily with breakfast.   furosemide 80 MG tablet Commonly known as: LASIX Take 1 tablet (80 mg total) by mouth daily. What changed:  medication strength how much to take when to take this   HYDROcodone-acetaminophen 5-325 MG tablet Commonly known as: NORCO/VICODIN Take 1-2 tablets by mouth every 6 (six) hours as needed for moderate pain or severe pain. What changed:  how much to take when to take this reasons to take this   ondansetron 8 MG tablet Commonly known as: ZOFRAN Take 1 tablet (8 mg total) by mouth every 8 (eight) hours as needed.   PRESERVISION AREDS 2+MULTI VIT PO Take 1 tablet by mouth 2 (two) times daily.   prochlorperazine 10 MG tablet Commonly known as: COMPAZINE Take 1 tablet (10 mg  total) by mouth every 6 (six) hours as needed for nausea or vomiting.   vitamin B-12 1000 MCG tablet Commonly known as: CYANOCOBALAMIN Take 1 tablet (1,000 mcg total) by mouth daily.               Durable Medical Equipment  (From admission, onward)           Start     Ordered  10/23/21 1548  For home use only DME Walker rolling  Once       Question Answer Comment  Walker: With [place]   Patient needs a walker to treat with the following condition General weakness      10/23/21 1548              Discharge Care Instructions  (From admission, onward)           Start     Ordered   10/25/21 0000  Discharge wound care:       Comments: Change dressing at PleurX catheter site with each draining.   10/25/21 4037            Follow-up Information     Orson Slick, MD Follow up on 10/23/2021.   Specialty: Hematology and Oncology Why: at 2:40Pm Contact information: [address] [place] [address] [phone]         [facility], [facility] Follow up.   Specialty: Home Health Services Why: Registered Nurse, Physical and Occupational Therapy-office to contact patient with visit times. Contact information: [address] [place] [address] [phone]         [facility] Follow up.   Why: Rolling Walker-to be delivered to the room prior to discharge home. Contact information: [place] [address] [phone]         [place] [facility] Follow up on 10/31/2021.   Specialty: Cardiology Why: obtain CBC and BMET blood work during lab hours (8AM-4:30PM) Contact information: [address], [address] [place] Allen, [place], PA-C Follow up on 11/19/2021.   Specialty: Cardiology Why: $RemoveBeforeD'@12'[id number]$ :15PM. Cardiology follow up Contact information: [address]  [place] [address] [phone]                 Allergies  Allergen Reactions   Sulfa Antibiotics Nausea Only     If you experience worsening of your admission symptoms, develop shortness of breath, life threatening emergency, suicidal or homicidal thoughts you must seek medical attention immediately by calling 911 or calling your MD immediately  if symptoms less severe.    Please note   You were cared for by a hospitalist during your hospital stay. If you have any questions about your discharge medications or the care you received while you were in the hospital after you are discharged, you can call the unit and asked to speak with the hospitalist on call if the hospitalist that took care of you is not available. Once you are discharged, your primary care physician will handle any further medical issues. Please note that NO REFILLS for any discharge medications will be authorized once you are discharged, as it is imperative that you return to your primary care physician (or establish a relationship with a primary care physician if you do not have one) for your aftercare needs so that they can reassess your need for medications and monitor your lab values.   Consultations: PCCM Cardiology    Procedures/Studies: DG Chest 1 View  Result Date: 10/23/2021 CLINICAL DATA:  Shortness of breath. EXAM: CHEST  1 VIEW COMPARISON:  Chest x-ray dated October 21, 2021. FINDINGS: New left-sided PleurX drainage catheter with decreased small left pleural effusion and mildly improved aeration at the left lung base. Unchanged small right pleural effusion and right basilar atelectasis. New small left apical pneumothorax,  less than 5%. The heart size and mediastinal contours are within normal limits. No acute osseous abnormality. IMPRESSION: 1. New left-sided PleurX drainage catheter with decreased small left pleural effusion. 2. New small left apical pneumothorax, less than 5%. 3. Unchanged small right pleural effusion and right basilar  atelectasis. Electronically Signed   By: Titus Dubin M.D.   On: 10/23/2021 18:42   DG Chest 1 View  Result Date: 10/17/2021 CLINICAL DATA:  Left thoracentesis. EXAM: CHEST  1 VIEW COMPARISON:  10/15/2021 FINDINGS: Interval decrease in left pleural effusion with only trace left pleural effusion visible on the current study. No left-sided pneumothorax. Similar appearance right base collapse/consolidation with small right pleural effusion. The cardio pericardial silhouette is enlarged. The visualized bony structures of the thorax show no acute abnormality. Telemetry leads overlie the chest. IMPRESSION: Interval decrease in left pleural effusion without evidence for pneumothorax. Electronically Signed   By: Misty Stanley M.D.   On: 10/17/2021 16:17   DG Chest 1 View  Result Date: 10/12/2021 CLINICAL DATA:  Post left-sided thoracentesis. EXAM: CHEST  1 VIEW COMPARISON:  Radiographs 10/12/2021 and 10/11/2021. FINDINGS: 1255 hours. Interval significant decrease in size of previously demonstrated left pleural effusion with improved aeration of the left lung base. Small right pleural effusion and right basilar atelectasis remain. The heart size and mediastinal contours are stable with aortic atherosclerosis. No evidence of pneumothorax. The bones appear unchanged status post lower cervical fusion. Telemetry leads overlie the chest. IMPRESSION: Interval improved left pleural effusion and left basilar aeration following thoracentesis. No pneumothorax. Electronically Signed   By: Richardean Sale M.D.   On: 10/12/2021 13:03   DG Chest 1 View  Result Date: 10/11/2021 CLINICAL DATA:  Dyspnea and wheezing. EXAM: CHEST  1 VIEW COMPARISON:  Radiograph earlier today, additional priors reviewed. FINDINGS: Bilateral pleural effusions, left greater than right, at least moderate in size. No significant change from earlier today. Associated bibasilar opacities. Interstitial edema with slight improvement. Heart is likely  enlarged, but obscured by pleural effusions on the current exam. No pneumothorax. IMPRESSION: 1. Unchanged bilateral pleural effusions, left greater than right, at least moderate in size. 2. Interstitial edema with slight improvement from earlier today. Electronically Signed   By: Keith Rake M.D.   On: 10/11/2021 19:15   DG Chest 1 View  Result Date: 10/10/2021 CLINICAL DATA:  Post thoracentesis EXAM: CHEST  1 VIEW COMPARISON:  Earlier same day FINDINGS: Decreased left pleural effusion with improved left lung aeration. Right pleural effusion is also decreased with improved right basilar aeration. Stable interstitial prominence. No pneumothorax. Normal heart size for technique. IMPRESSION: Significantly decreased left pleural effusion with improved lung aeration. No pneumothorax. Right pleural effusion has also decreased with improved right basilar aeration. Electronically Signed   By: Macy Mis M.D.   On: 10/10/2021 14:51   DG Chest 2 View  Result Date: 10/18/2021 CLINICAL DATA:  Shortness of breath. EXAM: CHEST - 2 VIEW COMPARISON:  10/17/2021 prior studies FINDINGS: Cardiomediastinal silhouette is unchanged. Bilateral pleural effusions are again noted as well as RIGHT LOWER lung consolidation/atelectasis. Slightly increasing atelectasis versus airspace disease in the LEFT LOWER lung is noted. There is no evidence of pneumothorax. No other interval changes are present. IMPRESSION: 1. Slightly increasing atelectasis versus airspace disease in the LEFT LOWER lung. 2. Little significant change in bilateral pleural effusions with RIGHT LOWER lung consolidation/atelectasis. Electronically Signed   By: Margarette Canada M.D.   On: 10/18/2021 11:42   CARDIAC CATHETERIZATION  Result Date: 10/13/2021  1. Right and left heart filling pressures are elevated. 2. Prominent v-wave suggestive of significant mitral regurgitation. 3. Cardiac output is normal. 4. Mixed pulmonary venous/pulmonary arterial HTN    US RENAL  Result Date: 10/12/2021 CLINICAL DATA:  Concern for hydronephrosis. EXAM: RENAL / URINARY TRACT ULTRASOUND COMPLETE COMPARISON:  Renal ultrasound dated 09/12/2021. FINDINGS: Right Kidney: Renal measurements: 8.6 x 3.3 x 4.1 cm = volume: 61 mL. Echogenicity within normal limits. A renal cyst measures 2.5 x 2.8 x 1.9 cm. No solid mass or hydronephrosis visualized. Left Kidney: Renal measurements: 8.2 x 4.5 x 4.5 cm = volume: 87 mL. Echogenicity within normal limits. No mass or hydronephrosis visualized. Bladder: Appears normal for degree of bladder distention. Other: The images are suboptimal due to rapid breathing and inability to perform breath hold during the exam. IMPRESSION: No evidence of hydronephrosis. Electronically Signed   By: Zerita Boers M.D.   On: 10/12/2021 15:28   DG CHEST PORT 1 VIEW  Result Date: 10/24/2021 CLINICAL DATA:  Chest pain EXAM: PORTABLE CHEST 1 VIEW COMPARISON:  Previous studies including the examination of 10/23/2021 FINDINGS: Transverse diameter of heart is slightly increased. There are no signs of pulmonary edema. Bilateral pleural effusions are seen with no significant interval change. There are patchy infiltrates in the medial aspects of both lower lung fields, more so on the left side. Catheter is seen in the left pleural space in the left lower lung fields. There is small left apical pneumothorax measuring 1.4 cm in thickness. Left lung remains well expanded. There is no shift of mediastinum. IMPRESSION: Bilateral pleural effusions. There are patchy infiltrates in the lower lung fields, more so on the left side suggesting atelectasis/pneumonia. Small left apical pneumothorax with no significant change. Electronically Signed   By: Elmer Picker M.D.   On: 10/24/2021 11:30   DG CHEST PORT 1 VIEW  Result Date: 10/21/2021 CLINICAL DATA:  Shortness of breath, pleural effusion EXAM: PORTABLE CHEST 1 VIEW COMPARISON:  Previous studies including the  examination of 10/18/2021 FINDINGS: Transverse diameter of heart is increased. Central pulmonary vessels are prominent. Bilateral pleural effusions are seen, more so on the left side. There is increase in amount of left pleural effusion. Increased markings are seen in both lower lung fields suggesting underlying atelectasis/pneumonia. There is no pneumothorax. IMPRESSION: Cardiomegaly. Bilateral pleural effusions. There is interval increase in amount of left pleural effusion. Increased markings are seen in both lower lung fields suggesting atelectasis/pneumonitis. Electronically Signed   By: Elmer Picker M.D.   On: 10/21/2021 10:47   DG CHEST PORT 1 VIEW  Result Date: 10/15/2021 CLINICAL DATA:  LEFT pleural effusion EXAM: PORTABLE CHEST 1 VIEW COMPARISON:  Portable exam 1448 hours compared to 10/12/2021 FINDINGS: Normal heart size and mediastinal contours. Moderate LEFT pleural effusion significantly increased from prior exam. Persistent small RIGHT pleural effusion and basilar atelectasis. Upper lungs clear. No pneumothorax or acute osseous findings. Atherosclerotic calcification aortic arch. IMPRESSION: Bibasilar pleural effusions and atelectasis larger on LEFT, significantly increased on the LEFT since prior study. Aortic Atherosclerosis (ICD10-I70.0). Electronically Signed   By: Lavonia Dana M.D.   On: 10/15/2021 16:44   DG CHEST PORT 1 VIEW  Result Date: 10/12/2021 CLINICAL DATA:  Left pleural effusion.  History of malignancy. EXAM: PORTABLE CHEST 1 VIEW COMPARISON:  Chest radiograph 08/11/2021 FINDINGS: Moderate left and small right pleural effusion, slightly increased compared to yesterday's exam. Opacification of the left lower lobe secondary to pleural fluid left lower lobe atelectasis. Right basilar atelectasis also noted.  Diffuse hazy interstitial thickening, consistent with pulmonary edema. No pneumothorax. Unable to assess heart size due to adjacent consolidations. Aortic calcifications.  No acute osseous abnormality. IMPRESSION: Bilateral pleural effusions, moderate left and small right, mildly increased compared to yesterday's exam. Bibasilar compressive atelectasis. Mild pulmonary edema. Aortic Atherosclerosis (ICD10-I70.0). Electronically Signed   By: Ileana Roup M.D.   On: 10/12/2021 10:57   DG Chest Port 1 View  Result Date: 10/11/2021 CLINICAL DATA:  Follow-up pleural effusions. EXAM: PORTABLE CHEST 1 VIEW COMPARISON:  10/10/2021 FINDINGS: Stable cardiomediastinal contours. Bilateral pleural effusions are again noted left greater than right. Compared with the previous exam there is been interval increase in left pleural effusion. There is mild interstitial edema. Progressive pulmonary opacities are identified within both mid and lower lung zones, left greater than right. IMPRESSION: 1. Bilateral pleural effusions, left greater than right. 2. Mild interstitial edema with progressive bilateral pulmonary opacities, left greater than right. Electronically Signed   By: Kerby Moors M.D.   On: 10/11/2021 06:35   DG Chest Portable 1 View  Result Date: 10/10/2021 CLINICAL DATA:  Increased short of breath EXAM: PORTABLE CHEST 1 VIEW COMPARISON:  None. FINDINGS: Cardiac silhouette is obscured by a large LEFT pleural effusion. Effusion is increased significantly from comparison exam and now occupies 2/3 of the LEFT hemithorax. Small RIGHT effusion is also increased. Mild airspace disease in RIGHT lower lobe. IMPRESSION: 1. Markedly increased large LEFT pleural effusion. 2. Small RIGHT effusion and probable RIGHT lower lobe edema. Electronically Signed   By: Suzy Bouchard M.D.   On: 10/10/2021 07:36   MR CARDIAC MORPHOLOGY WO CONTRAST  Result Date: 10/20/2021 CLINICAL DATA:  Evaluate mitral regurgitation EXAM: CARDIAC MRI TECHNIQUE: The patient was scanned on a 1.5 Tesla Siemens magnet. A dedicated cardiac coil was used. Functional imaging was done using Fiesta sequences. 2,3, and 4  chamber views were done to assess for RWMA's. Modified Simpson's rule using a short axis stack was used to calculate an ejection fraction on a dedicated work Conservation officer, nature. The patient did not receive Gadavist. CONTRAST:  No contrast administered FINDINGS: Left ventricle: -Asymmetric hypertrophy measuring 96mm in basal septum, 50mm in posterior wall -T1 elevated at 1170ms -Normal size -Normal systolic function LV EF:  63% (Normal 56-78%) Absolute volumes: LV EDV: 80mL (Normal 52-141 mL) LV ESV: 27mL (Normal 13-51 mL) LV SV: 75mL (Normal 33-97 mL) CO: 4.0L/min (Normal 2.7-6.0 L/min) Indexed volumes: LV EDV: 75mL/sq-m (Normal 41-81 mL/sq-m) LV ESV: 12mL/sq-m (Normal 12-21 mL/sq-m) LV SV: 40mL/sq-m (Normal 26-56 mL/sq-m) CI: 2.7L/min/sq-m (Normal 1.8-3.8 L/min/sq-m) Right ventricle: Normal size and systolic function RV EF: 92% (Normal 47-80%) Absolute volumes: RV EDV: 74mL (Normal 58-154 mL) RV ESV: 85mL (Normal 12-68 mL) RV SV: 80mL (Normal 35-98 mL) CO: 3.8L/min (Normal 2.7-6 L/min) Indexed volumes: RV EDV: 51mL/sq-m (Normal 48-87 mL/sq-m) RV ESV: 5mL/sq-m (Normal 11-28 mL/sq-m) RV SV: 18mL/sq-m (Normal 27-57 mL/sq-m) CI: 2.5L/min/sq-m (Normal 1.8-3.8 L/min/sq-m) Left atrium: Normal size Right atrium: Normal size Mitral valve: Moderate regurgitation (regurgitant fraction 21%) Aortic valve: Mild to moderate regurgitation (regurgitant fraction 18%) Tricuspid valve: Moderate regurgitation (regurgitant fraction 28%) Pulmonic valve: No regurgitation Aorta: Normal proximal ascending aorta Pericardium: Small to moderate effusion measuring 7mm adjacent to LV inferior wall Extracardiac structures: Moderate bilateral pleural effusions IMPRESSION: 1. Moderate asymmetric LV hypertrophy measuring 54mm in basal septum (32mm in posterior wall) 2. Elevated myocardial native T1 values (1110ms), which could be consistent with amyloidosis, though contrast was not administered due to renal function which reduces  specificity of T1 findings, as unable to calculate ECV or assess for LGE to confirm amyloidosis 3.  Normal LV size and systolic function (EF 11%) 4.  Normal RV size and systolic function (EF 94%) 5.  Moderate mitral regurgitation (regurgitant fraction 21%) 6.  Moderate tricuspid regurgitation (regurgitant fraction 28%) 7.  Mild to moderate aortic regurgitation (regurgitant fraction 18%) 8. Small to moderate pericardial effusion measuring 51mm adjacent to LV inferior wall 9.  Moderate bilateral pleural effusions Electronically Signed   By: Oswaldo Milian M.D.   On: 10/20/2021 21:56   ECHOCARDIOGRAM COMPLETE  Result Date: 10/10/2021    ECHOCARDIOGRAM REPORT   Patient Name:   Jillian Cooley Date of Exam: 10/10/2021 Medical Rec #:  [id number]             Height:       63.0 in Accession #:    [id number]            Weight:       125.1 lb Date of Birth:  20-Aug-1932             BSA:          1.584 m Patient Age:    76 years              BP:           87/66 mmHg Patient Gender: F                     HR:           81 bpm. 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 Risk Factors:Hypertension and Dyslipidemia.  Sonographer:    Bernadene Person RDCS Referring Phys: [place]  1. Normal LV function; mild AI; severe MR; elevated mean gradient across MV of 5 mmHg likely at least partially related to severe MR; moderate TR.  2. Left ventricular ejection fraction, by estimation, is 60 to 65%. The left ventricle has normal function. The left ventricle has no regional wall motion abnormalities. Left ventricular diastolic parameters are indeterminate.  3. Right ventricular systolic function is normal. The right ventricular size is normal. There is moderately elevated pulmonary artery systolic  pressure.  4. Left atrial size was moderately dilated.  5. Large pleural effusion in the left lateral region.  6. The mitral valve is abnormal. Severe mitral valve regurgitation. Mild mitral stenosis.  7. Tricuspid valve regurgitation is moderate.  8. The aortic valve has an indeterminant number of cusps. Aortic valve regurgitation is mild. No aortic stenosis is present.  9. The inferior vena cava is normal in size with <50% respiratory variability, suggesting right atrial pressure of 8 mmHg. FINDINGS  Left Ventricle: Left ventricular ejection fraction, by estimation, is 60 to 65%. The left ventricle has normal function. The left ventricle has no regional wall motion abnormalities. The left ventricular internal cavity size was normal in size. There is  no left ventricular hypertrophy. Left ventricular diastolic parameters are indeterminate. Right Ventricle: The right ventricular size is normal. Right ventricular systolic function is normal. There is moderately elevated pulmonary artery systolic pressure. The tricuspid regurgitant velocity is 3.40 m/s, and with an assumed right atrial pressure of 8  mmHg, the estimated right ventricular systolic pressure is 54.2 mmHg. Left Atrium: Left atrial size was moderately dilated. Right Atrium: Right atrial size was normal in size. Pericardium: Trivial pericardial effusion is present. Mitral Valve: The mitral valve is abnormal. There is mild thickening of the mitral valve leaflet(s). Severe mitral valve regurgitation. Mild mitral valve stenosis. Tricuspid Valve: The tricuspid valve is normal in structure. Tricuspid valve regurgitation is moderate . No evidence of tricuspid stenosis. Aortic Valve: The aortic valve has an indeterminant number of cusps. Aortic valve regurgitation is mild. Aortic regurgitation PHT measures 242 msec. No aortic stenosis is present. Pulmonic Valve: The pulmonic valve was grossly normal. Pulmonic valve regurgitation is not visualized. No evidence of  pulmonic stenosis. Aorta: The aortic root is normal in size and structure. Venous: The inferior vena cava is normal in size with less than 50% respiratory variability, suggesting right atrial pressure of 8 mmHg. IAS/Shunts: No atrial level shunt detected by color flow Doppler. Additional Comments: Normal LV function; mild AI; severe MR; elevated mean gradient across MV of 5 mmHg likely at least partially related to severe MR; moderate TR. There is a large pleural effusion in the left lateral region.  LEFT VENTRICLE PLAX 2D LVIDd:         3.60 cm LVIDs:         2.70 cm LV PW:         0.90 cm LV IVS:        0.90 cm LVOT diam:     1.50 cm LV SV:         25 LV SV Index:   16 LVOT Area:     1.77 cm  LV Volumes (MOD) LV vol d, MOD A2C: 49.8 ml LV vol d, MOD A4C: 65.4 ml LV vol s, MOD A2C: 26.8 ml LV vol s, MOD A4C: 42.0 ml LV SV MOD A2C:     23.0 ml LV SV MOD A4C:     65.4 ml LV SV MOD BP:      22.0 ml RIGHT VENTRICLE TAPSE (M-mode): 1.1 cm LEFT ATRIUM             Index        RIGHT ATRIUM           Index LA diam:        2.70 cm 1.70 cm/m   RA Area:     14.50 cm LA Vol (A2C):   47.4 ml 29.92 ml/m  RA Volume:   35.80 ml  22.60 ml/m LA Vol (A4C):   44.6 ml 28.16 ml/m LA Biplane Vol: 50.9 ml 32.13 ml/m  AORTIC VALVE LVOT Vmax:   65.60 cm/s LVOT Vmean:  43.400 cm/s LVOT VTI:    0.140 m AI PHT:      242 msec  AORTA Ao Root diam: 3.00 cm Ao Asc diam:  3.00 cm MR Peak grad:    63.8 mmHg    TRICUSPID VALVE MR Mean grad:    44.0 mmHg    TR Peak grad:   46.2 mmHg MR Vmax:         399.50 cm/s  TR Vmax:        340.00 cm/s MR Vmean:        314.0 cm/s MR PISA:         2.26 cm     SHUNTS MR PISA Eff ROA: 22 mm       Systemic VTI:  0.14 m MR PISA Radius:  0.60 cm  Systemic Diam: 1.50 cm Kirk Ruths MD Electronically signed by Kirk Ruths MD Signature Date/Time: 10/10/2021/2:04:01 PM    Final    ECHO TEE  Result Date: 10/19/2021    TRANSESOPHOGEAL ECHO REPORT   Patient Name:   Jillian Cooley Date of Exam:  10/17/2021 Medical Rec #:  [id number]             Height:       63.0 in Accession #:    [id number]            Weight:       122.1 lb Date of Birth:  08/26/1932             BSA:          1.568 m Patient Age:    25 years              BP:           116/63 mmHg Patient Gender: F                     HR:           93 bpm. Exam Location:  Inpatient Procedure: 2D Echo, 3D Echo, Color Doppler and Cardiac Doppler Indications:     Mitral Regurgitation  History:         Patient has prior history of Echocardiogram examinations, most                  recent 10/10/2021. CHF; Risk Factors:Hypertension.  Sonographer:     Raquel Sarna Senior RDCS Referring Phys:  [id number] Tami Lin [facility] Diagnosing Phys: Cherlynn Kaiser MD PROCEDURE: After discussion of the risks and benefits of a TEE, an informed consent was obtained from the patient. The transesophogeal probe was passed without difficulty through the esophogus of the patient. Local oropharyngeal anesthetic was provided with Cetacaine. Sedation performed by different physician. The patient was monitored while under deep sedation. Anesthestetic sedation was provided intravenously by Anesthesiology: 173mg  of Propofol. The patient developed no complications during the procedure. IMPRESSIONS  1. The mitral valve is abnormally thickened, with restricted motion. Severe mitral valve regurgitation with at least 2 jets, mechanism of MR is likely from restricted leaflet motion and commissural malcoaptation. Systolic reversals in the pulmonary veins. The mean mitral valve gradient is 4.5 mmHg with average heart rate of 74 bpm, suggesting at least mild mitral stenosis. Moderate mitral annular calcification. Severe leaflet thickening. Wilkins score 10-12.  2. Left ventricular ejection fraction, by estimation, is 60 to 65%. The left ventricle has normal function.  3. Right ventricular systolic function is mildly reduced. The right ventricular size is normal.  4. Left atrial size was moderately dilated.  No left atrial/left atrial appendage thrombus was detected. The LAA emptying velocity was 54 cm/s.  5. Right atrial size was mildly dilated.  6. The tricuspid valve is abnormally thickened. Mild TR.  7. The aortic valve is abnormal. There is severe thickening of the aortic valve. Aortic valve regurgitation is mild. Mild aortic valve sclerosis is present, with no evidence of aortic valve stenosis.  8. There is Moderate (Grade III) atheroma plaque involving the transverse and descending aorta. Conclusion(s)/Recommendation(s): The mitral valve regurgitation on this exam appears at least moderate-severe by color flow however this is in the setting of significant hypotension, mean BP in 80/40s for majority of exam. There are still systolic reversals in pulmonary veins and at least two jets of MR. All cardiac valves (AV, TV, MV, PV) appear  abnormal thickened, suggesting possible post inflammatory valve changes. Based on the appearance of the mitral valve, unlikely to be suitable for TEER, consider structural heart team discussion. FINDINGS  Left Ventricle: Left ventricular ejection fraction, by estimation, is 60 to 65%. The left ventricle has normal function. The left ventricular internal cavity size was normal in size. Right Ventricle: The right ventricular size is normal. Right vetricular wall thickness was not well visualized. Right ventricular systolic function is mildly reduced. Left Atrium: Left atrial size was moderately dilated. No left atrial/left atrial appendage thrombus was detected. The LAA emptying velocity was 54 cm/s. Right Atrium: Right atrial size was mildly dilated. Pericardium: There is no evidence of pericardial effusion. Mitral Valve: The mitral valve is abnormal. There is severe thickening of the mitral valve leaflet(s). Severely decreased mobility of the mitral valve leaflets. Moderate mitral annular calcification. Severe mitral valve regurgitation. Mild mitral valve stenosis. MV peak gradient, 7.6  mmHg. The mean mitral valve gradient is 4.5 mmHg with average heart rate of 74 bpm. Tricuspid Valve: The tricuspid valve is abnormal. Tricuspid valve regurgitation is mild. Aortic Valve: The aortic valve is abnormal. There is severe thickening of the aortic valve. Aortic valve regurgitation is mild. Mild aortic valve sclerosis is present, with no evidence of aortic valve stenosis. Pulmonic Valve: The pulmonic valve was thickened with mildly decreased excursion. Pulmonic valve regurgitation is trivial. Aorta: The aortic root and ascending aorta are structurally normal, with no evidence of dilitation. There is moderate (Grade III) atheroma plaque involving the transverse and descending aorta. IAS/Shunts: No atrial level shunt detected by color flow Doppler.   AORTA Ao Root diam: 3.00 cm Ao Asc diam:  3.40 cm MITRAL VALVE MV Peak grad: 7.6 mmHg MV Mean grad: 4.5 mmHg MV Vmax:      1.38 m/s MV Vmean:     97.0 cm/s MR Peak grad:    90.6 mmHg MR Mean grad:    67.0 mmHg MR Vmax:         476.00 cm/s MR Vmean:        394.0 cm/s MR PISA:         1.57 cm MR PISA Eff ROA: 17 mm MR PISA Radius:  0.50 cm Cherlynn Kaiser MD Electronically signed by Cherlynn Kaiser MD Signature Date/Time: 10/19/2021/8:55:34 AM    Final    VAS [place] LOWER EXTREMITY VENOUS (DVT)  Result Date: 10/11/2021  Lower Venous DVT Study Patient Name:  Jillian Cooley  Date of Exam:   10/10/2021 Medical Rec #: [id number]              Accession #:    [id number] Date of Birth: 02-24-1932              Patient Gender: F Patient Age:   47 years Exam Location:  [facility] Procedure:      VAS [place] LOWER EXTREMITY VENOUS (DVT) Referring Phys: MURALI RAMASWAMY --------------------------------------------------------------------------------  Indications: Edema, and history of DVT.  Comparison Study: 04/25/2021 bilateral lower extremity venous duplex- acute DVT                   right soleal veins and left PTV. Performing Technologist: Maudry Mayhew  MHA, RDMS, RVT, RDCS  Examination Guidelines: A complete evaluation includes B-mode imaging, spectral Doppler, color Doppler, and power Doppler as needed of all accessible portions of each vessel. Bilateral testing is considered an integral part of a complete examination. Limited examinations for reoccurring indications may be performed as noted. The reflux  portion of the exam is performed with the patient in reverse Trendelenburg.  +---------+---------------+---------+-----------+----------+--------------+ RIGHT    CompressibilityPhasicitySpontaneityPropertiesThrombus Aging +---------+---------------+---------+-----------+----------+--------------+ CFV      Full           No       Yes                                 +---------+---------------+---------+-----------+----------+--------------+ SFJ      Full                                                        +---------+---------------+---------+-----------+----------+--------------+ FV Prox  Full                                                        +---------+---------------+---------+-----------+----------+--------------+ FV Mid   Full                                                        +---------+---------------+---------+-----------+----------+--------------+ FV DistalFull                                                        +---------+---------------+---------+-----------+----------+--------------+ PFV      Full                                                        +---------+---------------+---------+-----------+----------+--------------+ POP      Full           No       Yes                                 +---------+---------------+---------+-----------+----------+--------------+ PTV      Full                                                        +---------+---------------+---------+-----------+----------+--------------+ PERO     Full                                                         +---------+---------------+---------+-----------+----------+--------------+   Right Technical Findings: Not visualized segments include limited visualization PTV and peroneal veins.  +---------+---------------+---------+-----------+----------+--------------+ LEFT     CompressibilityPhasicitySpontaneityPropertiesThrombus Aging +---------+---------------+---------+-----------+----------+--------------+ CFV      Full  No       Yes                                 +---------+---------------+---------+-----------+----------+--------------+ SFJ      Full                                                        +---------+---------------+---------+-----------+----------+--------------+ FV Prox  Full                                                        +---------+---------------+---------+-----------+----------+--------------+ FV Mid   Full                                                        +---------+---------------+---------+-----------+----------+--------------+ FV DistalFull                                                        +---------+---------------+---------+-----------+----------+--------------+ PFV      Full                                                        +---------+---------------+---------+-----------+----------+--------------+ POP      Full           No       Yes                                 +---------+---------------+---------+-----------+----------+--------------+   Left Technical Findings: Not visualized segments include PTV, peroneal veins.   Summary: RIGHT: - There is no evidence of deep vein thrombosis in the lower extremity. However, portions of this examination were limited- see technologist comments above.  - No cystic structure found in the popliteal fossa.  LEFT: - There is no evidence of deep vein thrombosis in the lower extremity. However, portions of this examination were limited- see technologist comments  above.  - No cystic structure found in the popliteal fossa.  Bilateral lower extremity veins exhibit pulsatile flow, suggestive of possibly elevated right heart pressure.  *See table(s) above for measurements and observations. Electronically signed by Deitra Mayo MD on 10/11/2021 at 5:14:55 AM.    Final    ECHOCARDIOGRAM LIMITED  Result Date: 10/22/2021    ECHOCARDIOGRAM LIMITED REPORT   Patient Name:   Jillian Cooley Date of Exam: 10/22/2021 Medical Rec #:  [id number]             Height:       63.0 in Accession #:    [id number]  Weight:       110.5 lb Date of Birth:  1932/10/27             BSA:          1.503 m Patient Age:    37 years              BP:           95/65 mmHg Patient Gender: F                     HR:           84 bpm. Exam Location:  Inpatient Procedure: 2D Echo, Cardiac Doppler and Limited Color Doppler Indications:    lmtd for pericardial effusion  History:        Patient has prior history of Echocardiogram examinations, most                 recent 10/10/2021. Previous Myocardial Infarction, Pulmonary                 HTN, Mitral Valve Disease; Risk Factors:Hypertension. Pulmonary                 Embolism                 Pericardial effusion.  Sonographer:    Glo Herring Referring Phys: [id number] [place]  Conclusion(s)/Recommendation(s): Normal LV size and function. Moderate pleural effusion. No significant pericardial effusion. Normal IVC. LEFT VENTRICLE PLAX 2D LVIDd:         3.00 cm LVIDs:         2.10 cm LV PW:         1.30 cm LV IVS:        1.30 cm  IVC IVC diam: 1.70 cm LEFT ATRIUM         Index LA diam:    3.80 cm 2.53 cm/m Phineas Inches Electronically signed by Phineas Inches Signature Date/Time: 10/22/2021/7:50:28 PM    Final    US Abdomen Limited RUQ (LIVER/GB)  Result Date: 10/12/2021 CLINICAL DATA:  Cirrhosis. EXAM: ULTRASOUND ABDOMEN LIMITED RIGHT UPPER QUADRANT COMPARISON:  Renal ultrasound dated 09/12/2021. FINDINGS: Gallbladder: No  gallstones or wall thickening visualized. No sonographic Murphy sign noted by sonographer. Common bile duct: Diameter: 4 mm Liver: No focal lesion identified. Increased parenchymal echogenicity. Portal vein is patent on color Doppler imaging with normal direction of blood flow towards the liver. Other: A right pleural effusion is noted. Images are suboptimal due to fast breathing by the patient and inability to perform breath hold during the exam. IMPRESSION: 1. Increased liver echogenicity likely reflects hepatic steatosis. No focal hepatic lesion is identified. 2.  Right pleural effusion. Electronically Signed   By: Zerita Boers M.D.   On: 10/12/2021 15:30   IR THORACENTESIS ASP PLEURAL SPACE W/IMG GUIDE  Result Date: 10/17/2021 INDICATION: Recurrent pleural effusion EXAM: ULTRASOUND GUIDED LEFT THORACENTESIS MEDICATIONS: None. COMPLICATIONS: None immediate. PROCEDURE: An ultrasound guided thoracentesis was thoroughly discussed with the patient and questions answered. The benefits, risks, alternatives and complications were also discussed. The patient understands and wishes to proceed with the procedure. Written consent was obtained. Ultrasound was performed to localize and mark an adequate pocket of fluid in the left chest. The area was then prepped and draped in the normal sterile fashion. 1% Lidocaine was used for local anesthesia. A 6 Fr Safe-T-Centesis catheter was introduced. Thoracentesis was performed. The catheter was removed and a dressing applied. FINDINGS: A total  of approximately 1.6L of red tinged pleural fluid was removed. IMPRESSION: Successful ultrasound guided left thoracentesis yielding 1.6L of pleural fluid. Electronically Signed   By: Miachel Roux M.D.   On: 10/17/2021 12:19   US THORACENTESIS ASP PLEURAL SPACE W/IMG GUIDE  Result Date: 10/12/2021 INDICATION: Chronic kidney disease with recurrent left pleural effusion. Request for therapeutic thoracentesis. EXAM: ULTRASOUND GUIDED  LEFT THORACENTESIS MEDICATIONS: 1% lidocaine 8 mL COMPLICATIONS: None immediate. PROCEDURE: An ultrasound guided thoracentesis was thoroughly discussed with the patient and questions answered. The benefits, risks, alternatives and complications were also discussed. The patient understands and wishes to proceed with the procedure. Written consent was obtained. Ultrasound was performed to localize and mark an adequate pocket of fluid in the left chest. The area was then prepped and draped in the normal sterile fashion. 1% Lidocaine was used for local anesthesia. Under ultrasound guidance a 6 Fr Safe-T-Centesis catheter was introduced. Thoracentesis was performed. The catheter was removed and a dressing applied. FINDINGS: A total of approximately 1.2 L of red tinged fluid was removed. IMPRESSION: Successful ultrasound guided left thoracentesis yielding 1.2 L of pleural fluid. Read by: Gareth Eagle, PA-C Electronically Signed   By: Ruthann Cancer M.D.   On: 10/12/2021 14:48   US THORACENTESIS ASP PLEURAL SPACE W/IMG GUIDE  Result Date: 10/10/2021 INDICATION: Shortness of breath with hypoxia. Large left pleural effusion. Request for diagnostic and therapeutic thoracentesis, some maximum of 1.5 L. EXAM: ULTRASOUND GUIDED LEFT THORACENTESIS MEDICATIONS: 1% plain lidocaine, 5 mL COMPLICATIONS: None immediate. PROCEDURE: An ultrasound guided thoracentesis was thoroughly discussed with the patient and questions answered. The benefits, risks, alternatives and complications were also discussed. The patient understands and wishes to proceed with the procedure. Written consent was obtained. Ultrasound was performed to localize and mark an adequate pocket of fluid in the left chest. The area was then prepped and draped in the normal sterile fashion. 1% Lidocaine was used for local anesthesia. Under ultrasound guidance a 6 Fr Safe-T-Centesis catheter was introduced. Thoracentesis was performed. The catheter was removed and a  dressing applied. FINDINGS: A total of approximately 1.5 L of clear yellow fluid was removed. Samples were sent to the laboratory as requested by the clinical team. IMPRESSION: Successful ultrasound guided left thoracentesis yielding 1.5 L of pleural fluid. Read by: Ascencion Dike PA-C Electronically Signed   By: Sandi Mariscal M.D.   On: 10/10/2021 14:49        Subjective: Pt still feels weak but anxious to go home.  No SOB.  Having pain at the drain site, overall controlled with pain medication but it makes her sleepy.  Appetite little bit improved and hopeful it gets better.     Discharge Exam: Vitals:   10/25/21 0504 10/25/21 0827  BP: (!) 100/53 (!) 98/55  Pulse: 91 95  Resp: 11 18  Temp: (!) 97.5 F (36.4 C) 97.7 F (36.5 C)  SpO2: 100%    Vitals:   10/24/21 2203 10/25/21 0018 10/25/21 0504 10/25/21 0827  BP: (!) 109/58 (!) 97/58 (!) 100/53 (!) 98/55  Pulse: 98 87 91 95  Resp: $Remo'13 19 11 18  'RXlno$ Temp: 97.7 F (36.5 C) 97.8 F (36.6 C) (!) 97.5 F (36.4 C) 97.7 F (36.5 C)  TempSrc: Oral Oral Oral Oral  SpO2: 100% 97% 100%   Weight:   47.6 kg   Height:        General: Pt is alert, awake, not in acute distress Cardiovascular: RRR, S1/S2 +, no rubs, no gallops Respiratory: CTA  bilaterally, Left Pleurx catheter in place, no wheezing, no rhonchi, normal respiratory effort at rest on room air. Abdominal: Soft, NT, ND, bowel sounds + Extremities: no edema, no cyanosis    The results of significant diagnostics from this hospitalization (including imaging, microbiology, ancillary and laboratory) are listed below for reference.     Microbiology: No results found for this or any previous visit (from the past 240 hour(s)).   Labs: BNP (last 3 results) Recent Labs    04/24/21 0920 10/10/21 0731 10/16/21 0252  BNP 528.9* 1,070.9* 3,734.2*   Basic Metabolic Panel: Recent Labs  Lab 10/21/21 0235 10/22/21 0325 10/23/21 0235 10/24/21 0523 10/25/21 0117  NA 134* 135 134*  133* 133*  K 4.1 4.0 3.9 3.9 4.3  CL 98 99 99 98 97*  CO2 $Re'26 26 27 28 28  'wEd$ GLUCOSE 107* 100* 103* 109* 117*  BUN 66* 65* 63* 61* 61*  CREATININE 2.45* 2.42* 2.29* 2.42* 2.26*  CALCIUM 9.2 9.1 9.0 9.1 9.1   Liver Function Tests: No results for input(s): AST, ALT, ALKPHOS, BILITOT, PROT, ALBUMIN in the last 168 hours. No results for input(s): LIPASE, AMYLASE in the last 168 hours. No results for input(s): AMMONIA in the last 168 hours. CBC: Recent Labs  Lab 10/20/21 0156 10/21/21 0235 10/22/21 0325 10/23/21 0235 10/24/21 0523  WBC 9.7 7.7 9.0 8.3 7.9  HGB 11.1* 10.8* 10.3* 10.7* 10.3*  HCT 34.3* 32.8* 32.3* 32.9* 31.5*  MCV 91.5 90.9 92.6 92.4 92.9  PLT 215 209 200 199 181   Cardiac Enzymes: No results for input(s): CKTOTAL, CKMB, CKMBINDEX, TROPONINI in the last 168 hours. BNP: Invalid input(s): POCBNP CBG: No results for input(s): GLUCAP in the last 168 hours. D-Dimer No results for input(s): DDIMER in the last 72 hours. Hgb A1c No results for input(s): HGBA1C in the last 72 hours. Lipid Profile No results for input(s): CHOL, HDL, LDLCALC, TRIG, CHOLHDL, LDLDIRECT in the last 72 hours. Thyroid function studies No results for input(s): TSH, T4TOTAL, T3FREE, THYROIDAB in the last 72 hours.  Invalid input(s): FREET3 Anemia work up No results for input(s): VITAMINB12, FOLATE, FERRITIN, TIBC, IRON, RETICCTPCT in the last 72 hours. Urinalysis    Component Value Date/Time   COLORURINE YELLOW 10/10/2021 1900   APPEARANCEUR CLOUDY (A) 10/10/2021 1900   LABSPEC 1.013 10/10/2021 1900   PHURINE 5.0 10/10/2021 1900   GLUCOSEU NEGATIVE 10/10/2021 1900   HGBUR NEGATIVE 10/10/2021 1900   BILIRUBINUR NEGATIVE 10/10/2021 1900   KETONESUR NEGATIVE 10/10/2021 1900   PROTEINUR NEGATIVE 10/10/2021 1900   NITRITE NEGATIVE 10/10/2021 1900   LEUKOCYTESUR MODERATE (A) 10/10/2021 1900   Sepsis Labs Invalid input(s): PROCALCITONIN,  WBC,  LACTICIDVEN Microbiology No results found  for this or any previous visit (from the past 240 hour(s)).   Time coordinating discharge: Over 30 minutes  SIGNED:   Ezekiel Slocumb, DO [facility] 10/25/2021, 9:25 AM   If 7PM-7AM, please contact night-coverage [email or web address]

## 2021-10-26 ENCOUNTER — Encounter (HOSPITAL_COMMUNITY): Payer: Self-pay | Admitting: Internal Medicine

## 2021-10-27 ENCOUNTER — Telehealth: Payer: Self-pay | Admitting: *Deleted

## 2021-10-27 NOTE — Telephone Encounter (Signed)
Advised that I would let [name] know of this. Encouraged her to call back with any changes in her mother's condition.

## 2021-10-27 NOTE — Telephone Encounter (Signed)
Received call from pt's daughter, Kern Reap. She states her mother was discharged home from the hospital on Saturday. She has a pleuryx drain and home health came yesterday to drain (550cc)  They will be coming every other day.  Daughter states that she is very weak and her BP drops to 70/40 when she stands up. She does not think she will be ready for her next treatment on 10/31/21 and wants to wait until her next appt on November 27, 2021

## 2021-10-28 ENCOUNTER — Telehealth: Payer: Self-pay | Admitting: Pulmonary Disease

## 2021-10-28 DIAGNOSIS — J90 Pleural effusion, not elsewhere classified: Secondary | ICD-10-CM

## 2021-10-28 NOTE — Telephone Encounter (Signed)
I left a message for [facility] per Dr. Silas Flood and waiting on a response.

## 2021-10-28 NOTE — Telephone Encounter (Signed)
I called Jillian Cooley back to let her know that we would be sending this message to you and if you are going to manage the drain.   She also wants to know if this will need to be drained daily or 3 times a week until she is at 1 or less in the container. Please advise.

## 2021-10-28 NOTE — Telephone Encounter (Signed)
I can help manage. I recommend daily drainage. Can you clarify if the "1 or less in the container" references 1 liter line?

## 2021-10-28 NOTE — Telephone Encounter (Signed)
Spoke with Glenard Haring from [place] who states that the bottles are mL's and that patient got 550 on Sunday as well as 550 today. She states that she does not feel like patient was done draining today but stated that she was hurting and wanted to stop. Advised her that Dr. Silas Flood said he recommends draining daily, she said she would call the daughter to let her know since they taught her how to do it and she is the main one draining patient on regular basis. Advised her that if anything changes with drain like the amount that they get decreases several days in a row, pain or redness, and if it stops draining completely to call and let us know. She expressed understanding. Nothing further needed at this time. Will route to Dr. Silas Flood as Juluis Rainier.

## 2021-10-28 NOTE — Telephone Encounter (Signed)
Yes - we can do labs here (assuming phlebotomist is here that day).

## 2021-10-28 NOTE — Telephone Encounter (Signed)
disregard

## 2021-10-28 NOTE — Telephone Encounter (Signed)
I have called the pts daughter and she is aware of appt scheduled for the pt on 11/11 with MH.  She stated that the pt is scheduled to have labs done at the cardiology office at 11;45.    MH is there a way that we can do these labs in our office so the pt can be seen.  Pts daughter stated that she is still very weak and having them done at her OV with you would be better than having to take her out of the car again to get to the lab.   Thanks

## 2021-10-28 NOTE — Telephone Encounter (Signed)
Can we get her in for a f/u visit with me in the next couple of weeks? Thanks!

## 2021-10-29 NOTE — Telephone Encounter (Signed)
Yes, ok to order labs under my name. Thanks!

## 2021-10-29 NOTE — Telephone Encounter (Signed)
I called the patient daughter (DPR) and she is aware that the provider ordered labs and she did not have any questions. She will have her mom get labs once she is in the office for a visit on 10/31/21. Nothing further needed.

## 2021-10-29 NOTE — Addendum Note (Signed)
Addended by: Valerie Salts on: 10/29/2021 11:41 AM   Modules accepted: Orders

## 2021-10-29 NOTE — Telephone Encounter (Signed)
Spoke with Jillian Cooley. She can do the labs ordered by cardiology due to a billing issue. The labs will need to be placed in MH's name.   MH, please advise if you are ok with [place] placing the labs in your name. Per her chart, she will need a CBC and BMP.

## 2021-10-29 NOTE — Telephone Encounter (Signed)
Called patient's daughter but she did not answer. Left message for her to call back. I have placed the orders in MH's name so that the patient can have her labs drawn here.

## 2021-10-31 ENCOUNTER — Other Ambulatory Visit (INDEPENDENT_AMBULATORY_CARE_PROVIDER_SITE_OTHER): Payer: Medicare PPO

## 2021-10-31 ENCOUNTER — Other Ambulatory Visit: Payer: Self-pay

## 2021-10-31 ENCOUNTER — Encounter: Payer: Self-pay | Admitting: Pulmonary Disease

## 2021-10-31 ENCOUNTER — Other Ambulatory Visit: Payer: Medicare PPO

## 2021-10-31 ENCOUNTER — Inpatient Hospital Stay: Payer: Medicare PPO

## 2021-10-31 ENCOUNTER — Ambulatory Visit: Payer: Medicare PPO | Admitting: Pulmonary Disease

## 2021-10-31 VITALS — BP 98/60 | HR 100 | Temp 97.5°F | Ht 63.0 in | Wt 105.0 lb

## 2021-10-31 DIAGNOSIS — J90 Pleural effusion, not elsewhere classified: Secondary | ICD-10-CM

## 2021-10-31 DIAGNOSIS — C9000 Multiple myeloma not having achieved remission: Secondary | ICD-10-CM | POA: Diagnosis not present

## 2021-10-31 DIAGNOSIS — E8770 Fluid overload, unspecified: Secondary | ICD-10-CM

## 2021-10-31 LAB — COMPREHENSIVE METABOLIC PANEL
ALT: 23 U/L (ref 0–35)
AST: 22 U/L (ref 0–37)
Albumin: 3.7 g/dL (ref 3.5–5.2)
Alkaline Phosphatase: 72 U/L (ref 39–117)
BUN: 83 mg/dL — ABNORMAL HIGH (ref 6–23)
CO2: 26 mEq/L (ref 19–32)
Calcium: 9.4 mg/dL (ref 8.4–10.5)
Chloride: 89 mEq/L — ABNORMAL LOW (ref 96–112)
Creatinine, Ser: 2.39 mg/dL — ABNORMAL HIGH (ref 0.40–1.20)
GFR: 17.86 mL/min — ABNORMAL LOW (ref >60.00)
Glucose, Bld: 116 mg/dL — ABNORMAL HIGH (ref 70–99)
Potassium: 3.7 mEq/L (ref 3.5–5.1)
Sodium: 127 mEq/L — ABNORMAL LOW (ref 135–145)
Total Bilirubin: 0.9 mg/dL (ref 0.2–1.2)
Total Protein: 6.3 g/dL (ref 6.0–8.3)

## 2021-10-31 LAB — CBC WITH DIFFERENTIAL/PLATELET
Basophils Absolute: 0 10*3/uL (ref 0.0–0.1)
Basophils Relative: 0.5 % (ref 0.0–3.0)
Eosinophils Absolute: 0.1 10*3/uL (ref 0.0–0.7)
Eosinophils Relative: 1.1 % (ref 0.0–5.0)
HCT: 34.1 % — ABNORMAL LOW (ref 36.0–46.0)
Hemoglobin: 11.4 g/dL — ABNORMAL LOW (ref 12.0–15.0)
Lymphocytes Relative: 15.2 % (ref 12.0–46.0)
Lymphs Abs: 1 10*3/uL (ref 0.7–4.0)
MCHC: 33.5 g/dL (ref 30.0–36.0)
MCV: 90.4 fl (ref 78.0–100.0)
Monocytes Absolute: 0.5 10*3/uL (ref 0.1–1.0)
Monocytes Relative: 6.9 % (ref 3.0–12.0)
Neutro Abs: 5.1 10*3/uL (ref 1.4–7.7)
Neutrophils Relative %: 76.3 % (ref 43.0–77.0)
Platelets: 267 10*3/uL (ref 150.0–400.0)
RBC: 3.77 Mil/uL — ABNORMAL LOW (ref 3.87–5.11)
RDW: 15.6 % — ABNORMAL HIGH (ref 11.5–15.5)
WBC: 6.7 10*3/uL (ref 4.0–10.5)

## 2021-10-31 NOTE — Progress Notes (Signed)
Patient ID: Jillian Cooley, female    DOB: 03-15-1932, 87 y.o.   MRN: [id number]  Chief Complaint  Patient presents with   Follow-up    Pleural effusion    Referring provider: [name], MD  HPI:   87 y.o. woman whom we are seeing in follow up for evaluation of pleural effusion and dyspnea on exertion.  Unfortunate, has developed her been diagnosed with myeloma in the interim since last follow-up.  Pleurx catheter placed 10/24/2021.  Draining well.  Serially decreasing drainage amounts.  On higher doses of Lasix as well.  She return to clinic with husband and daughter today.  She seems a bit down.  Breathing is improved overall since prolonged hospitalization at [facility].  Discharge summary reviewed.  Pleurx catheter placed.  No issues.  Denies any erythema or purulent drainage.  Draining well.  Serially decreasing amounts over the last couple of days.  Draining daily.  Has helped her breathing.  She is also on Lasix 80 mg daily.  Double what she was on prior to hospitalization.  This is greatly improved and now resolved her chronic lower extremity swelling.  Suspect this contributed to decreasing amounts of drainage from Pleurx catheter as well.  Unfortunate, she and daughter describe very clearly orthostatic changes when rising from a lying or seated position.  In addition, blood pressure drops with this sometimes to systolics in the 32K or 02R.  Notably blood pressure overall is down from prior, 98/60 today.  HPI at initial visit:  Patient noted gradual onset of dyspnea on exertion over the last several weeks. This is associated with lower extremity swelling. She endorses mainly dyspnea going up inclines or stairs but can occur on flat surfaces. Also notes difficulty lying flat, sleeping more upright. Severity is described as severe. No alleviating or exacerbating factors otherwise. No timing during the day when things are better or worse. No seasonal variation that she can  tell. No environmental factors that she can identify the mechanics better or worse. No medicines have been changed, no real intervention prior to seeing me in terms of trying to improve her symptoms.  Work-up included chest x-ray December 30, 2020 that personally reviewed and on my interpretation demonstrates bilateral pleural effusions with left larger than right. Additional work-up including echocardiogram. This was reviewed. This demonstrates aortic and mitral valve insufficiency with dilated LA.  PMH: Hypertension Surgical history: Cervical and lumbar the surgery Family history: She denies any significant history of respiratory or cardiac issues in first-degree relatives Social history: Lives in [place], married, never smoker  Licensed conveyancer / Pulmonary Flowsheets:   ACT:  No flowsheet data found.  MMRC: mMRC Dyspnea Scale mMRC Score  09/02/2021 3  01/30/2021 1    Epworth:  No flowsheet data found.  Tests:   FENO:  No results found for: NITRICOXIDE  PFT: No flowsheet data found.  WALK:  No flowsheet data found.  Imaging: Personally reviewed and as per EMR discussion of this note  Lab Results: Personally reviewed CBC    Component Value Date/Time   WBC 7.9 10/24/2021 0523   RBC 3.39 (L) 10/24/2021 0523   HGB 10.3 (L) 10/24/2021 0523   HGB 10.8 (L) 09/30/2021 0954   HCT 31.5 (L) 10/24/2021 0523   PLT 181 10/24/2021 0523   PLT 249 09/30/2021 0954   MCV 92.9 10/24/2021 0523   MCH 30.4 10/24/2021 0523   MCHC 32.7 10/24/2021 0523   RDW 15.4 10/24/2021 0523   LYMPHSABS 0.8 10/10/2021 1548  MONOABS 0.5 10/10/2021 1548   EOSABS 0.0 10/10/2021 1548   BASOSABS 0.0 10/10/2021 1548    BMET    Component Value Date/Time   NA 133 (L) 10/25/2021 0117   NA 141 07/31/2021 1104   K 4.3 10/25/2021 0117   CL 97 (L) 10/25/2021 0117   CO2 28 10/25/2021 0117   GLUCOSE 117 (H) 10/25/2021 0117   BUN 61 (H) 10/25/2021 0117   BUN 61 (H) 07/31/2021 1104   CREATININE 2.26  (H) 10/25/2021 0117   CREATININE 2.38 (H) 09/30/2021 0954   CALCIUM 9.1 10/25/2021 0117   GFRNONAA 20 (L) 10/25/2021 0117   GFRNONAA 19 (L) 09/30/2021 0954   GFRAA 46 (L) 06/08/2012 1001    BNP    Component Value Date/Time   BNP 1,121.0 (H) 10/16/2021 0252    ProBNP    Component Value Date/Time   PROBNP 3,761 (H) 07/07/2021 1500    Specialty Problems       Pulmonary Problems   Pleural effusion   Acute on chronic respiratory failure with hypoxia (HCC)    Allergies  Allergen Reactions   Sulfa Antibiotics Nausea Only    Immunization History  Administered Date(s) Administered   Influenza, High Dose Seasonal PF 11/08/2014, 12/04/2015, 09/28/2016   Influenza, Quadrivalent, Recombinant, Inj, Pf 10/07/2017, 08/31/2018, 09/04/2019, 09/18/2020, 09/17/2021   Influenza-Unspecified 10/21/2018   PFIZER(Purple Top)SARS-COV-2 Vaccination 01/09/2020, 01/29/2020, 10/01/2020   Pneumococcal Conjugate-13 05/07/2014   Pneumococcal Polysaccharide-23 01/02/2008, 07/22/2017   Tdap 04/11/2013    Past Medical History:  Diagnosis Date   Abnormal EKG    Carpal tunnel syndrome    CKD (chronic kidney disease), stage III (HCC)    DDD (degenerative disc disease), lumbar    Hypertension    Hypertension with renal disease    Menopause    Mixed hyperlipidemia    Muscular degeneration    Nonrheumatic aortic (valve) insufficiency    Nonrheumatic mitral valve regurgitation    Osteoarthritis    Pedal edema    Pleural effusion    Skin cancer    Vitamin D deficiency     Tobacco History: Social History   Tobacco Use  Smoking Status Never  Smokeless Tobacco Never   Counseling given: Not Answered   Continue to not smoke  Outpatient Encounter Medications as of 10/31/2021  Medication Sig   atorvastatin (LIPITOR) 20 MG tablet Take 1 tablet (20 mg total) by mouth daily.   Calcium Carb-Cholecalciferol (CALTRATE 600+D3 PO) Take 1 tablet by mouth daily with breakfast.   ELIQUIS 5 MG TABS  tablet TAKE 1 TABLET BY MOUTH TWICE A DAY   ferrous sulfate 325 (65 FE) MG EC tablet Take 1 tablet (325 mg total) by mouth daily with breakfast.   furosemide (LASIX) 80 MG tablet Take 1 tablet (80 mg total) by mouth daily.   HYDROcodone-acetaminophen (NORCO/VICODIN) 5-325 MG tablet Take 1-2 tablets by mouth every 6 (six) hours as needed for moderate pain or severe pain.   Multiple Vitamins-Minerals (PRESERVISION AREDS 2+MULTI VIT PO) Take 1 tablet by mouth 2 (two) times daily.   vitamin B-12 (CYANOCOBALAMIN) 1000 MCG tablet Take 1 tablet (1,000 mcg total) by mouth daily.   acyclovir (ZOVIRAX) 400 MG tablet Take 1 tablet (400 mg total) by mouth 2 (two) times daily. (Patient not taking: Reported on 10/31/2021)   dexamethasone (DECADRON) 4 MG tablet Take 5 tablets (20 mg total) by mouth once a week. (Patient not taking: Reported on 10/31/2021)   ondansetron (ZOFRAN) 8 MG tablet Take 1 tablet (8 mg  total) by mouth every 8 (eight) hours as needed. (Patient not taking: Reported on 10/31/2021)   prochlorperazine (COMPAZINE) 10 MG tablet Take 1 tablet (10 mg total) by mouth every 6 (six) hours as needed for nausea or vomiting. (Patient not taking: Reported on 10/31/2021)   No facility-administered encounter medications on file as of 10/31/2021.     Review of Systems  Review of Systems  N/a Physical Exam  BP 98/60 (BP Location: Right Arm, Cuff Size: Normal)   Pulse 100   Temp (!) 97.5 F (36.4 C) (Oral)   Ht 5\' 3"  (1.6 m)   Wt 105 lb (47.6 kg)   SpO2 98%   BMI 18.60 kg/m   Wt Readings from Last 5 Encounters:  10/31/21 105 lb (47.6 kg)  10/25/21 104 lb 15 oz (47.6 kg)  09/30/21 125 lb 1.6 oz (56.7 kg)  09/16/21 126 lb 8 oz (57.4 kg)  09/02/21 125 lb 3.2 oz (56.8 kg)    BMI Readings from Last 5 Encounters:  10/31/21 18.60 kg/m  10/25/21 18.59 kg/m  09/30/21 22.16 kg/m  09/16/21 22.41 kg/m  09/02/21 22.18 kg/m     Physical Exam General: Well-appearing, no acute  distress Eyes: EOMI, icterus Respiratory: Clear bilaterally, normal work of breathing Cardiovascular: Regular rhythm, no murmurs noted Abdomen: Nondistended, bowel sounds present MSK: No synovitis, no joint effusion Neuro: Normal gait, no weakness Psych: No mood, full affect   Assessment & Plan:   Dyspnea on exertion: Related to pleural effusion.  Possible contribution of mild anemia as developed in the setting of her newly diagnosed myeloma.  Pleural effusions: Left greater than right.  Transudative in past and likely related to cardiogenic volume overload.  Lymphocytosis favored to reflect chronicity of effusion as opposed to contribution from myeloma.  Pleurx placed 10/24/2021.  Decreasing drainage amounts.  Suspect this is more likely due to aggressive Lasix administration treating underlying volume overload as opposed to pleurodesis given how quick this is occurred.  Will need sutures removed in about 1 week.  We will arrange clinic visit for this.  Orthostasis: To stop daily Lasix dose, 80 milligrams daily.  Given orthostatic changes as well as description of lower blood pressures at home.  Will obtain CMP, CBC today.  We will contact patient Monday for further instructions on Lasix dosing.  Follow-up in about 1 week, Dr. Valeta Harms or myself No follow-ups on file.   Lanier Clam, MD 10/31/2021   I spent 45 minutes in the care of this patient including review of records, coordination of care, face-to-face visit.

## 2021-10-31 NOTE — Patient Instructions (Signed)
Continue draining the catheter every day  Please contact the office when you get 125 cc or less for 3 consecutive days for further instructions  I will work on finding an appointment time week after next for getting the sutures removed  Stop the Lasix.  We will get labs today of the lung.  Please await further instruction from me on Monday regarding future plans for Lasix.  I hope your meeting with [name] goes well next week.

## 2021-11-04 ENCOUNTER — Telehealth: Payer: Self-pay

## 2021-11-04 NOTE — Telephone Encounter (Signed)
Called and spoke with husband DPR to get patient scheduled for follow up after pleurx placement. She has been scheduled. Nothing further needed at this time. Will route message as FYI.   Next Appt With Pulmonology Martyn Ehrich, NP)11/10/2021 at 11:00 AM

## 2021-11-04 NOTE — Telephone Encounter (Signed)
-----  Message from Martyn Ehrich, NP sent at 11/04/2021  9:17 AM EST ----- Regarding: RE: PleurX suture removal Ya either of [place] should be able to, not a problem --Beth ----- Message ----- From: Lanier Clam, MD Sent: 10/31/2021   1:48 PM EST To: Melvenia Needles, NP, Martyn Ehrich, NP Subject: PleurX suture removal                          Hello,  This patient had Pleurx placed 10/24/2021 by Linna Hoff. Chronic transudative effusion I think related to horrible mitral valve and volume overload. She has been subsequently diagnosed with multiple myeloma. Oncology is following. Saw her in follow-up today. My practice pattern is to wait 2 weeks to remove sutures so I did not remove them today. Maybe that was a mistake. Would either of you be willing to see her in your clinic 11/10/2021 for suture removal? Looks like there is some availability. If not I totally understand, I can add on clinic date to do that if needed.   Thanks,  [facility]

## 2021-11-04 NOTE — Telephone Encounter (Signed)
Can see actually see Jillian Cooley? Would be good patient for her

## 2021-11-04 NOTE — Telephone Encounter (Signed)
Yes I've already switched it. Called the husband back to let him know as well. Nothing further needed at this time.

## 2021-11-05 ENCOUNTER — Telehealth: Payer: Self-pay | Admitting: *Deleted

## 2021-11-05 NOTE — Telephone Encounter (Signed)
Received call from pt's home health nurse, Glenard Haring. She states pt is very weak today and has been having low blood pressures 60-80/40's She states pt is too weak to come in tomorrow for labs/MD and too weak for her treatment. Her husband, Roselie Awkward prefers a telephone call in the morning from [name] instead.   Visit changed to phone call. Lab and treatment appts cancelled.

## 2021-11-05 NOTE — Telephone Encounter (Signed)
Received vm message from pt's home health nurse requesting a prescription for magic mouthwash.  Pt is c/o sore mouth and has white coating on her tongue

## 2021-11-06 ENCOUNTER — Telehealth: Payer: Self-pay | Admitting: *Deleted

## 2021-11-06 ENCOUNTER — Other Ambulatory Visit: Payer: Self-pay

## 2021-11-06 ENCOUNTER — Inpatient Hospital Stay: Payer: Medicare PPO

## 2021-11-06 ENCOUNTER — Inpatient Hospital Stay: Payer: Medicare PPO | Admitting: Hematology and Oncology

## 2021-11-06 ENCOUNTER — Inpatient Hospital Stay: Payer: Medicare PPO | Attending: Physician Assistant | Admitting: Hematology and Oncology

## 2021-11-06 ENCOUNTER — Encounter: Payer: Self-pay | Admitting: Hematology and Oncology

## 2021-11-06 DIAGNOSIS — D649 Anemia, unspecified: Secondary | ICD-10-CM

## 2021-11-06 DIAGNOSIS — I2699 Other pulmonary embolism without acute cor pulmonale: Secondary | ICD-10-CM | POA: Diagnosis not present

## 2021-11-06 DIAGNOSIS — C9000 Multiple myeloma not having achieved remission: Secondary | ICD-10-CM | POA: Diagnosis not present

## 2021-11-06 MED ORDER — MORPHINE SULFATE (CONCENTRATE) 20 MG/ML PO SOLN: 10.0000 mg | ORAL | 0 refills | Status: AC | PRN

## 2021-11-10 ENCOUNTER — Telehealth: Payer: Self-pay | Admitting: Primary Care

## 2021-11-10 ENCOUNTER — Ambulatory Visit: Payer: Medicare PPO | Admitting: Primary Care

## 2021-11-10 ENCOUNTER — Ambulatory Visit: Payer: Medicare PPO | Admitting: Nurse Practitioner

## 2021-11-10 ENCOUNTER — Other Ambulatory Visit: Payer: Self-pay | Admitting: Hematology and Oncology

## 2021-11-10 ENCOUNTER — Telehealth: Payer: Self-pay | Admitting: Pulmonary Disease

## 2021-11-10 NOTE — Telephone Encounter (Signed)
Patient passed away on 2021-11-08 according to chart note from [name] Oncology. Nothing further needed at this time.

## 2021-11-10 NOTE — Telephone Encounter (Addendum)
error 

## 2021-11-10 NOTE — Telephone Encounter (Signed)
Patient was supposed to be seen today for suture removal, patient did not show. Went to call patient to see if we needed to reschedule and saw note from Dr. Elbert Ewings office that patient passed away on 11/30/21 at 11:14 am. Will route to Dr. Silas Flood as Juluis Rainier. Nothing further needed at this time.

## 2021-11-10 NOTE — Telephone Encounter (Signed)
error 

## 2021-11-10 NOTE — Telephone Encounter (Signed)
Can you check on this lady, she was supposed to have an apt with [place] or me today. Dr. Carmon Sails wanted her to be seen for App for suture removal. I do not see any visits.

## 2021-11-14 ENCOUNTER — Inpatient Hospital Stay: Payer: Medicare PPO

## 2021-11-17 ENCOUNTER — Ambulatory Visit: Payer: Medicare PPO | Admitting: Gastroenterology

## 2021-11-19 ENCOUNTER — Ambulatory Visit: Payer: Medicare PPO | Admitting: Physician Assistant

## 2021-11-20 NOTE — Progress Notes (Signed)
[facility] [phone]   [phone]  PROGRESS NOTE  Patient Care Team: Georgianne Fick, MD as PCP - General (Internal Medicine) Christell Constant, MD as PCP - Cardiology (Cardiology) Christell Constant, MD as Consulting Physician (Cardiology) Jaci Standard, MD as Consulting Physician (Hematology and Oncology)  Hematological/Oncological History #Bilateral DVT and PE: 1) 04/23/2021-04/26/2021: Admitted for right rotator cuff repair and found to have acute hypoxia.  Chest xray found small left pleural effusion with associated atelectasis and trace right pleural effusion and right basilar airspace disease. Underwent left thoracentesis with removal of 800 cc. Due to persistent hypoxia, patient underwent NM pulmonary perfusion study that confirmed bilateral pulmonary emboli. Doppler US revealed bilateral acute DVT involving the right soleal veins and left posterior tibial veins. Patient was started on Eliquis.  2)  06/17/2021: Establish care with Georga Kaufmann PA-C and recommend Eliquis x 6 months.    #Normocytic Anemia: 1) 06/17/2021: Labs show anemia with Hgb 10.4, MCV 90.1 2) 06/27/2021: Labs show iron deficiency with serum iron 33, iron saturation 13%; vitamin B12 deficiency with vitamin B12 at 266 and elevated MMA at 410. Folate level normal. Recommended oral ferrous sulfate 325 mg once daily and vitamin b12 1000 mcg once daily.    #Multiple Myeloma: 1) 09/04/2021: Labs from [facility] -TIBC 228 (L), Iron 64, Iron saturation 28%, Ferritin 216 (H), Creatinine 1.96 (H), BUN 51 (H), Calcium 10.0, -CBC: Hgb 11.2,  -SPEP revealed M-Spike 0.3 with immunofixation that showed monoclonal free kappa light chain.  -sFLC: Free Kappa Lt Chain: 4504.5 (H), Free Lambda Lt Chain: 19.7, Kappa/Lambda Ratio: 228.65 (H) 09/24/2021: bone marrow biopsy showed hypercellular bone marrow for age with plasma cell neoplasm with plasma cells representing  25% of for cells in the aspirate  Interval History:  Jillian Cooley 87 y.o. female with medical history significant for Kappa light chain multiple myeloma who presents for a follow up visit. [name] last visit was on 09/30/2021. In the interim since the last visit she has had a prolonged hospitalization due to worsening shortness of breath and recurrent pleural effusion.  Today's visit is a phone visit per the patient's request.   On exam today Jillian Cooley joins [place] by telephone.  Multiple conversations with the patient's husband and the patient herself.  He reports that his wife has had difficulty swallowing in the interim since her discharge from the hospital.  She is having particular difficulty with pills today.  He notes that her Pleurx catheter continues to put out abnormal synovial fluid, reportedly 500 mL daily.  He notes that she is not keeping up with p.o. intake.  He reports that hospice was discussed with family indirectly, but we discussed it directly today.  Given her marked deconditioning and numerous comorbidities I do believe would be appropriate for her to transfer into hospice care.  The family agreed and we begin making arrangements to have the patient admitted to hospice.  MEDICAL HISTORY:  Past Medical History:  Diagnosis Date   Abnormal EKG    Carpal tunnel syndrome    CKD (chronic kidney disease), stage III (HCC)    DDD (degenerative disc disease), lumbar    Hypertension    Hypertension with renal disease    Menopause    Mixed hyperlipidemia    Muscular degeneration    Nonrheumatic aortic (valve) insufficiency    Nonrheumatic mitral valve regurgitation    Osteoarthritis    Pedal edema    Pleural effusion  Skin cancer    Vitamin D deficiency     SURGICAL HISTORY: Past Surgical History:  Procedure Laterality Date   ANTERIOR CERVICAL DECOMP/DISCECTOMY FUSION  2009   CARPAL TUNNEL RELEASE Bilateral    CHEST TUBE INSERTION Left 10/23/2021    Procedure: INSERTION PLEURAL DRAINAGE CATHETER;  Surgeon: Candee Furbish, MD;  Location: [facility] ENDOSCOPY;  Service: Pulmonary;  Laterality: Left;   IR THORACENTESIS ASP PLEURAL SPACE W/IMG GUIDE  04/25/2021   IR THORACENTESIS ASP PLEURAL SPACE W/IMG GUIDE  10/17/2021   LUMBAR LAMINECTOMY/DECOMPRESSION MICRODISCECTOMY  06/13/2012   Procedure: LUMBAR LAMINECTOMY/DECOMPRESSION MICRODISCECTOMY 1 LEVEL;  Surgeon: Ophelia Charter, MD;  Location: [place] NEURO ORS;  Service: Neurosurgery;  Laterality: Right;  RIGHT Lumbar two-three diskectomy   RIGHT HEART CATH N/A 10/13/2021   Procedure: RIGHT HEART CATH;  Surgeon: Larey Dresser, MD;  Location: [place] [facility] LAB;  Service: Cardiovascular;  Laterality: N/A;   SHOULDER ARTHROSCOPY WITH ROTATOR CUFF REPAIR AND SUBACROMIAL DECOMPRESSION Right 04/23/2021   Procedure: SHOULDER ARTHROSCOPY WITH ROTATOR CUFF REPAIR AND SUBACROMIAL DECOMPRESSION;  Surgeon: Earlie Server, MD;  Location: [place];  Service: Orthopedics;  Laterality: Right;   TEE WITHOUT CARDIOVERSION N/A 10/17/2021   Procedure: TRANSESOPHAGEAL ECHOCARDIOGRAM (TEE);  Surgeon: Elouise Munroe, MD;  Location: [place];  Service: Cardiology;  Laterality: N/A;   THORACENTESIS N/A 09/10/2021   Procedure: Mathews Robinsons;  Surgeon: Lanier Clam, MD;  Location: [facility] ENDOSCOPY;  Service: Pulmonary;  Laterality: N/A;    SOCIAL HISTORY: Social History   Socioeconomic History   Marital status: Married    Spouse name: Not on file   Number of children: Not on file   Years of education: Not on file   Highest education level: Not on file  Occupational History   Not on file  Tobacco Use   Smoking status: Never   Smokeless tobacco: Never  Substance and Sexual Activity   Alcohol use: No   Drug use: No   Sexual activity: Not on file  Other Topics Concern   Not on file  Social History Narrative   Not on file   Social Determinants of Health   Financial Resource Strain: Not on  file  Food Insecurity: Not on file  Transportation Needs: Not on file  Physical Activity: Not on file  Stress: Not on file  Social Connections: Not on file  Intimate Partner Violence: Not on file    FAMILY HISTORY: Family History  Problem Relation Age of Onset   Colon cancer Maternal Grandfather    Breast cancer Neg Hx     ALLERGIES:  is allergic to sulfa antibiotics.  MEDICATIONS:  Current Outpatient Medications  Medication Sig Dispense Refill   morphine (ROXANOL) 20 MG/ML concentrated solution Take 0.5 mLs (10 mg total) by mouth every 4 (four) hours as needed for severe pain or breakthrough pain. 15 mL 0   acyclovir (ZOVIRAX) 400 MG tablet Take 1 tablet (400 mg total) by mouth 2 (two) times daily. (Patient not taking: Reported on 10/31/2021) 60 tablet 3   atorvastatin (LIPITOR) 20 MG tablet Take 1 tablet (20 mg total) by mouth daily. 30 tablet 1   Calcium Carb-Cholecalciferol (CALTRATE 600+D3 PO) Take 1 tablet by mouth daily with breakfast.     dexamethasone (DECADRON) 4 MG tablet Take 5 tablets (20 mg total) by mouth once a week. (Patient not taking: Reported on 10/31/2021) 20 tablet 3   ELIQUIS 5 MG TABS tablet TAKE 1 TABLET BY MOUTH TWICE A DAY 60 tablet  3   ferrous sulfate 325 (65 FE) MG EC tablet Take 1 tablet (325 mg total) by mouth daily with breakfast. 30 tablet 3   furosemide (LASIX) 80 MG tablet Take 1 tablet (80 mg total) by mouth daily. 30 tablet 1   HYDROcodone-acetaminophen (NORCO/VICODIN) 5-325 MG tablet Take 1-2 tablets by mouth every 6 (six) hours as needed for moderate pain or severe pain. 30 tablet 0   Multiple Vitamins-Minerals (PRESERVISION AREDS 2+MULTI VIT PO) Take 1 tablet by mouth 2 (two) times daily.     ondansetron (ZOFRAN) 8 MG tablet Take 1 tablet (8 mg total) by mouth every 8 (eight) hours as needed. (Patient not taking: Reported on 10/31/2021) 30 tablet 0   prochlorperazine (COMPAZINE) 10 MG tablet Take 1 tablet (10 mg total) by mouth every 6 (six)  hours as needed for nausea or vomiting. (Patient not taking: Reported on 10/31/2021) 30 tablet 0   vitamin B-12 (CYANOCOBALAMIN) 1000 MCG tablet Take 1 tablet (1,000 mcg total) by mouth daily. 30 tablet 3   No current facility-administered medications for this visit.    PHYSICAL EXAMINATION: ECOG PERFORMANCE STATUS: 2 - Symptomatic, <50% confined to bed  There were no vitals filed for this visit.  There were no vitals filed for this visit.   Telephone Visit--No physical examination or vitals.   LABORATORY DATA:  I have reviewed the data as listed CBC Latest Ref Rng & Units 10/31/2021 10/24/2021 10/23/2021  WBC 4.0 - 10.5 K/uL 6.7 7.9 8.3  Hemoglobin 12.0 - 15.0 g/dL 11.4(L) 10.3(L) 10.7(L)  Hematocrit 36.0 - 46.0 % 34.1(L) 31.5(L) 32.9(L)  Platelets 150.0 - 400.0 K/uL 267.0 181 199    CMP Latest Ref Rng & Units 10/31/2021 10/25/2021 10/24/2021  Glucose 70 - 99 mg/dL 116(H) 117(H) 109(H)  BUN 6 - 23 mg/dL 83(H) 61(H) 61(H)  Creatinine 0.40 - 1.20 mg/dL 2.39(H) 2.26(H) 2.42(H)  Sodium 135 - 145 mEq/L 127(L) 133(L) 133(L)  Potassium 3.5 - 5.1 mEq/L 3.7 4.3 3.9  Chloride 96 - 112 mEq/L 89(L) 97(L) 98  CO2 19 - 32 mEq/L $Remove'26 28 28  'Zsrhazq$ Calcium 8.4 - 10.5 mg/dL 9.4 9.1 9.1  Total Protein 6.0 - 8.3 g/dL 6.3 - -  Total Bilirubin 0.2 - 1.2 mg/dL 0.9 - -  Alkaline Phos 39 - 117 U/L 72 - -  AST 0 - 37 U/L 22 - -  ALT 0 - 35 U/L 23 - -    No results found for: MPROTEIN Lab Results  Component Value Date   KAPLAMBRATIO 534.21 (H) 09/18/2021   RADIOGRAPHIC STUDIES: DG Chest 1 View  Result Date: 10/23/2021 CLINICAL DATA:  Shortness of breath. EXAM: CHEST  1 VIEW COMPARISON:  Chest x-ray dated October 21, 2021. FINDINGS: New left-sided PleurX drainage catheter with decreased small left pleural effusion and mildly improved aeration at the left lung base. Unchanged small right pleural effusion and right basilar atelectasis. New small left apical pneumothorax, less than 5%. The heart size and  mediastinal contours are within normal limits. No acute osseous abnormality. IMPRESSION: 1. New left-sided PleurX drainage catheter with decreased small left pleural effusion. 2. New small left apical pneumothorax, less than 5%. 3. Unchanged small right pleural effusion and right basilar atelectasis. Electronically Signed   By: Titus Dubin M.D.   On: 10/23/2021 18:42   DG Chest 1 View  Result Date: 10/17/2021 CLINICAL DATA:  Left thoracentesis. EXAM: CHEST  1 VIEW COMPARISON:  10/15/2021 FINDINGS: Interval decrease in left pleural effusion with only trace left pleural  effusion visible on the current study. No left-sided pneumothorax. Similar appearance right base collapse/consolidation with small right pleural effusion. The cardio pericardial silhouette is enlarged. The visualized bony structures of the thorax show no acute abnormality. Telemetry leads overlie the chest. IMPRESSION: Interval decrease in left pleural effusion without evidence for pneumothorax. Electronically Signed   By: Misty Stanley M.D.   On: 10/17/2021 16:17   DG Chest 1 View  Result Date: 10/12/2021 CLINICAL DATA:  Post left-sided thoracentesis. EXAM: CHEST  1 VIEW COMPARISON:  Radiographs 10/12/2021 and 10/11/2021. FINDINGS: 1255 hours. Interval significant decrease in size of previously demonstrated left pleural effusion with improved aeration of the left lung base. Small right pleural effusion and right basilar atelectasis remain. The heart size and mediastinal contours are stable with aortic atherosclerosis. No evidence of pneumothorax. The bones appear unchanged status post lower cervical fusion. Telemetry leads overlie the chest. IMPRESSION: Interval improved left pleural effusion and left basilar aeration following thoracentesis. No pneumothorax. Electronically Signed   By: Richardean Sale M.D.   On: 10/12/2021 13:03   DG Chest 1 View  Result Date: 10/11/2021 CLINICAL DATA:  Dyspnea and wheezing. EXAM: CHEST  1 VIEW  COMPARISON:  Radiograph earlier today, additional priors reviewed. FINDINGS: Bilateral pleural effusions, left greater than right, at least moderate in size. No significant change from earlier today. Associated bibasilar opacities. Interstitial edema with slight improvement. Heart is likely enlarged, but obscured by pleural effusions on the current exam. No pneumothorax. IMPRESSION: 1. Unchanged bilateral pleural effusions, left greater than right, at least moderate in size. 2. Interstitial edema with slight improvement from earlier today. Electronically Signed   By: Keith Rake M.D.   On: 10/11/2021 19:15   DG Chest 1 View  Result Date: 10/10/2021 CLINICAL DATA:  Post thoracentesis EXAM: CHEST  1 VIEW COMPARISON:  Earlier same day FINDINGS: Decreased left pleural effusion with improved left lung aeration. Right pleural effusion is also decreased with improved right basilar aeration. Stable interstitial prominence. No pneumothorax. Normal heart size for technique. IMPRESSION: Significantly decreased left pleural effusion with improved lung aeration. No pneumothorax. Right pleural effusion has also decreased with improved right basilar aeration. Electronically Signed   By: Macy Mis M.D.   On: 10/10/2021 14:51   DG Chest 2 View  Result Date: 10/18/2021 CLINICAL DATA:  Shortness of breath. EXAM: CHEST - 2 VIEW COMPARISON:  10/17/2021 prior studies FINDINGS: Cardiomediastinal silhouette is unchanged. Bilateral pleural effusions are again noted as well as RIGHT LOWER lung consolidation/atelectasis. Slightly increasing atelectasis versus airspace disease in the LEFT LOWER lung is noted. There is no evidence of pneumothorax. No other interval changes are present. IMPRESSION: 1. Slightly increasing atelectasis versus airspace disease in the LEFT LOWER lung. 2. Little significant change in bilateral pleural effusions with RIGHT LOWER lung consolidation/atelectasis. Electronically Signed   By: Margarette Canada  M.D.   On: 10/18/2021 11:42   CARDIAC CATHETERIZATION  Result Date: 10/13/2021 1. Right and left heart filling pressures are elevated. 2. Prominent v-wave suggestive of significant mitral regurgitation. 3. Cardiac output is normal. 4. Mixed pulmonary venous/pulmonary arterial HTN   US RENAL  Result Date: 10/12/2021 CLINICAL DATA:  Concern for hydronephrosis. EXAM: RENAL / URINARY TRACT ULTRASOUND COMPLETE COMPARISON:  Renal ultrasound dated 09/12/2021. FINDINGS: Right Kidney: Renal measurements: 8.6 x 3.3 x 4.1 cm = volume: 61 mL. Echogenicity within normal limits. A renal cyst measures 2.5 x 2.8 x 1.9 cm. No solid mass or hydronephrosis visualized. Left Kidney: Renal measurements: 8.2 x 4.5 x  4.5 cm = volume: 87 mL. Echogenicity within normal limits. No mass or hydronephrosis visualized. Bladder: Appears normal for degree of bladder distention. Other: The images are suboptimal due to rapid breathing and inability to perform breath hold during the exam. IMPRESSION: No evidence of hydronephrosis. Electronically Signed   By: Zerita Boers M.D.   On: 10/12/2021 15:28   DG CHEST PORT 1 VIEW  Result Date: 10/24/2021 CLINICAL DATA:  Chest pain EXAM: PORTABLE CHEST 1 VIEW COMPARISON:  Previous studies including the examination of 10/23/2021 FINDINGS: Transverse diameter of heart is slightly increased. There are no signs of pulmonary edema. Bilateral pleural effusions are seen with no significant interval change. There are patchy infiltrates in the medial aspects of both lower lung fields, more so on the left side. Catheter is seen in the left pleural space in the left lower lung fields. There is small left apical pneumothorax measuring 1.4 cm in thickness. Left lung remains well expanded. There is no shift of mediastinum. IMPRESSION: Bilateral pleural effusions. There are patchy infiltrates in the lower lung fields, more so on the left side suggesting atelectasis/pneumonia. Small left apical pneumothorax with  no significant change. Electronically Signed   By: Elmer Picker M.D.   On: 10/24/2021 11:30   DG CHEST PORT 1 VIEW  Result Date: 10/21/2021 CLINICAL DATA:  Shortness of breath, pleural effusion EXAM: PORTABLE CHEST 1 VIEW COMPARISON:  Previous studies including the examination of 10/18/2021 FINDINGS: Transverse diameter of heart is increased. Central pulmonary vessels are prominent. Bilateral pleural effusions are seen, more so on the left side. There is increase in amount of left pleural effusion. Increased markings are seen in both lower lung fields suggesting underlying atelectasis/pneumonia. There is no pneumothorax. IMPRESSION: Cardiomegaly. Bilateral pleural effusions. There is interval increase in amount of left pleural effusion. Increased markings are seen in both lower lung fields suggesting atelectasis/pneumonitis. Electronically Signed   By: Elmer Picker M.D.   On: 10/21/2021 10:47   DG CHEST PORT 1 VIEW  Result Date: 10/15/2021 CLINICAL DATA:  LEFT pleural effusion EXAM: PORTABLE CHEST 1 VIEW COMPARISON:  Portable exam 1448 hours compared to 10/12/2021 FINDINGS: Normal heart size and mediastinal contours. Moderate LEFT pleural effusion significantly increased from prior exam. Persistent small RIGHT pleural effusion and basilar atelectasis. Upper lungs clear. No pneumothorax or acute osseous findings. Atherosclerotic calcification aortic arch. IMPRESSION: Bibasilar pleural effusions and atelectasis larger on LEFT, significantly increased on the LEFT since prior study. Aortic Atherosclerosis (ICD10-I70.0). Electronically Signed   By: Lavonia Dana M.D.   On: 10/15/2021 16:44   DG CHEST PORT 1 VIEW  Result Date: 10/12/2021 CLINICAL DATA:  Left pleural effusion.  History of malignancy. EXAM: PORTABLE CHEST 1 VIEW COMPARISON:  Chest radiograph 08/11/2021 FINDINGS: Moderate left and small right pleural effusion, slightly increased compared to yesterday's exam. Opacification of the  left lower lobe secondary to pleural fluid left lower lobe atelectasis. Right basilar atelectasis also noted. Diffuse hazy interstitial thickening, consistent with pulmonary edema. No pneumothorax. Unable to assess heart size due to adjacent consolidations. Aortic calcifications. No acute osseous abnormality. IMPRESSION: Bilateral pleural effusions, moderate left and small right, mildly increased compared to yesterday's exam. Bibasilar compressive atelectasis. Mild pulmonary edema. Aortic Atherosclerosis (ICD10-I70.0). Electronically Signed   By: Ileana Roup M.D.   On: 10/12/2021 10:57   DG Chest Port 1 View  Result Date: 10/11/2021 CLINICAL DATA:  Follow-up pleural effusions. EXAM: PORTABLE CHEST 1 VIEW COMPARISON:  10/10/2021 FINDINGS: Stable cardiomediastinal contours. Bilateral pleural effusions are again noted  left greater than right. Compared with the previous exam there is been interval increase in left pleural effusion. There is mild interstitial edema. Progressive pulmonary opacities are identified within both mid and lower lung zones, left greater than right. IMPRESSION: 1. Bilateral pleural effusions, left greater than right. 2. Mild interstitial edema with progressive bilateral pulmonary opacities, left greater than right. Electronically Signed   By: Kerby Moors M.D.   On: 10/11/2021 06:35   DG Chest Portable 1 View  Result Date: 10/10/2021 CLINICAL DATA:  Increased short of breath EXAM: PORTABLE CHEST 1 VIEW COMPARISON:  None. FINDINGS: Cardiac silhouette is obscured by a large LEFT pleural effusion. Effusion is increased significantly from comparison exam and now occupies 2/3 of the LEFT hemithorax. Small RIGHT effusion is also increased. Mild airspace disease in RIGHT lower lobe. IMPRESSION: 1. Markedly increased large LEFT pleural effusion. 2. Small RIGHT effusion and probable RIGHT lower lobe edema. Electronically Signed   By: Suzy Bouchard M.D.   On: 10/10/2021 07:36   MR CARDIAC  MORPHOLOGY WO CONTRAST  Result Date: 10/20/2021 CLINICAL DATA:  Evaluate mitral regurgitation EXAM: CARDIAC MRI TECHNIQUE: The patient was scanned on a 1.5 Tesla Siemens magnet. A dedicated cardiac coil was used. Functional imaging was done using Fiesta sequences. 2,3, and 4 chamber views were done to assess for RWMA's. Modified Simpson's rule using a short axis stack was used to calculate an ejection fraction on a dedicated work Conservation officer, nature. The patient did not receive Gadavist. CONTRAST:  No contrast administered FINDINGS: Left ventricle: -Asymmetric hypertrophy measuring 70mm in basal septum, 37mm in posterior wall -T1 elevated at 1161ms -Normal size -Normal systolic function LV EF:  63% (Normal 56-78%) Absolute volumes: LV EDV: 79mL (Normal 52-141 mL) LV ESV: 88mL (Normal 13-51 mL) LV SV: 32mL (Normal 33-97 mL) CO: 4.0L/min (Normal 2.7-6.0 L/min) Indexed volumes: LV EDV: 73mL/sq-m (Normal 41-81 mL/sq-m) LV ESV: 68mL/sq-m (Normal 12-21 mL/sq-m) LV SV: 48mL/sq-m (Normal 26-56 mL/sq-m) CI: 2.7L/min/sq-m (Normal 1.8-3.8 L/min/sq-m) Right ventricle: Normal size and systolic function RV EF: 13% (Normal 47-80%) Absolute volumes: RV EDV: 106mL (Normal 58-154 mL) RV ESV: 40mL (Normal 12-68 mL) RV SV: 41mL (Normal 35-98 mL) CO: 3.8L/min (Normal 2.7-6 L/min) Indexed volumes: RV EDV: 35mL/sq-m (Normal 48-87 mL/sq-m) RV ESV: 68mL/sq-m (Normal 11-28 mL/sq-m) RV SV: 57mL/sq-m (Normal 27-57 mL/sq-m) CI: 2.5L/min/sq-m (Normal 1.8-3.8 L/min/sq-m) Left atrium: Normal size Right atrium: Normal size Mitral valve: Moderate regurgitation (regurgitant fraction 21%) Aortic valve: Mild to moderate regurgitation (regurgitant fraction 18%) Tricuspid valve: Moderate regurgitation (regurgitant fraction 28%) Pulmonic valve: No regurgitation Aorta: Normal proximal ascending aorta Pericardium: Small to moderate effusion measuring 23mm adjacent to LV inferior wall Extracardiac structures: Moderate bilateral pleural effusions  IMPRESSION: 1. Moderate asymmetric LV hypertrophy measuring 70mm in basal septum (50mm in posterior wall) 2. Elevated myocardial native T1 values (1118ms), which could be consistent with amyloidosis, though contrast was not administered due to renal function which reduces specificity of T1 findings, as unable to calculate ECV or assess for LGE to confirm amyloidosis 3.  Normal LV size and systolic function (EF 08%) 4.  Normal RV size and systolic function (EF 65%) 5.  Moderate mitral regurgitation (regurgitant fraction 21%) 6.  Moderate tricuspid regurgitation (regurgitant fraction 28%) 7.  Mild to moderate aortic regurgitation (regurgitant fraction 18%) 8. Small to moderate pericardial effusion measuring 74mm adjacent to LV inferior wall 9.  Moderate bilateral pleural effusions Electronically Signed   By: Oswaldo Milian M.D.   On: 10/20/2021 21:56   ECHOCARDIOGRAM COMPLETE  Result Date: 10/10/2021    ECHOCARDIOGRAM REPORT   Patient Name:   Jillian Cooley Date of Exam: 10/10/2021 Medical Rec #:  [id number]             Height:       63.0 in Accession #:    [id number]            Weight:       125.1 lb Date of Birth:  September 20, 1932             BSA:          1.584 m Patient Age:    80 years              BP:           87/66 mmHg Patient Gender: F                     HR:           81 bpm. 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 Risk Factors:Hypertension and Dyslipidemia.  Sonographer:    Bernadene Person RDCS Referring Phys: [place]  1. Normal LV function; mild AI; severe MR; elevated mean gradient across MV of 5 mmHg likely at least partially related to severe MR; moderate TR.  2. Left ventricular ejection fraction, by estimation, is 60 to 65%. The left ventricle has normal  function. The left ventricle has no regional wall motion abnormalities. Left ventricular diastolic parameters are indeterminate.  3. Right ventricular systolic function is normal. The right ventricular size is normal. There is moderately elevated pulmonary artery systolic pressure.  4. Left atrial size was moderately dilated.  5. Large pleural effusion in the left lateral region.  6. The mitral valve is abnormal. Severe mitral valve regurgitation. Mild mitral stenosis.  7. Tricuspid valve regurgitation is moderate.  8. The aortic valve has an indeterminant number of cusps. Aortic valve regurgitation is mild. No aortic stenosis is present.  9. The inferior vena cava is normal in size with <50% respiratory variability, suggesting right atrial pressure of 8 mmHg. FINDINGS  Left Ventricle: Left ventricular ejection fraction, by estimation, is 60 to 65%. The left ventricle has normal function. The left ventricle has no regional wall motion abnormalities. The left ventricular internal cavity size was normal in size. There is  no left ventricular hypertrophy. Left ventricular diastolic parameters are indeterminate. Right Ventricle: The right ventricular size is normal. Right ventricular systolic function is normal. There is moderately elevated pulmonary artery systolic pressure. The tricuspid regurgitant velocity is 3.40 m/s, and with an assumed right atrial pressure of 8 mmHg, the estimated right ventricular systolic pressure is 16.5 mmHg. Left Atrium: Left atrial size was moderately dilated. Right Atrium: Right atrial size was normal in size. Pericardium: Trivial pericardial effusion is present. Mitral Valve: The mitral valve is abnormal. There is mild thickening of the mitral valve leaflet(s). Severe mitral valve regurgitation. Mild mitral valve stenosis. Tricuspid Valve: The tricuspid valve is normal in structure. Tricuspid valve regurgitation is moderate . No evidence of tricuspid stenosis. Aortic Valve: The aortic  valve has an indeterminant number of cusps. Aortic valve regurgitation is mild. Aortic regurgitation PHT measures 242 msec.  No aortic stenosis is present. Pulmonic Valve: The pulmonic valve was grossly normal. Pulmonic valve regurgitation is not visualized. No evidence of pulmonic stenosis. Aorta: The aortic root is normal in size and structure. Venous: The inferior vena cava is normal in size with less than 50% respiratory variability, suggesting right atrial pressure of 8 mmHg. IAS/Shunts: No atrial level shunt detected by color flow Doppler. Additional Comments: Normal LV function; mild AI; severe MR; elevated mean gradient across MV of 5 mmHg likely at least partially related to severe MR; moderate TR. There is a large pleural effusion in the left lateral region.  LEFT VENTRICLE PLAX 2D LVIDd:         3.60 cm LVIDs:         2.70 cm LV PW:         0.90 cm LV IVS:        0.90 cm LVOT diam:     1.50 cm LV SV:         25 LV SV Index:   16 LVOT Area:     1.77 cm  LV Volumes (MOD) LV vol d, MOD A2C: 49.8 ml LV vol d, MOD A4C: 65.4 ml LV vol s, MOD A2C: 26.8 ml LV vol s, MOD A4C: 42.0 ml LV SV MOD A2C:     23.0 ml LV SV MOD A4C:     65.4 ml LV SV MOD BP:      22.0 ml RIGHT VENTRICLE TAPSE (M-mode): 1.1 cm LEFT ATRIUM             Index        RIGHT ATRIUM           Index LA diam:        2.70 cm 1.70 cm/m   RA Area:     14.50 cm LA Vol (A2C):   47.4 ml 29.92 ml/m  RA Volume:   35.80 ml  22.60 ml/m LA Vol (A4C):   44.6 ml 28.16 ml/m LA Biplane Vol: 50.9 ml 32.13 ml/m  AORTIC VALVE LVOT Vmax:   65.60 cm/s LVOT Vmean:  43.400 cm/s LVOT VTI:    0.140 m AI PHT:      242 msec  AORTA Ao Root diam: 3.00 cm Ao Asc diam:  3.00 cm MR Peak grad:    63.8 mmHg    TRICUSPID VALVE MR Mean grad:    44.0 mmHg    TR Peak grad:   46.2 mmHg MR Vmax:         399.50 cm/s  TR Vmax:        340.00 cm/s MR Vmean:        314.0 cm/s MR PISA:         2.26 cm     SHUNTS MR PISA Eff ROA: 22 mm       Systemic VTI:  0.14 m MR PISA Radius:  0.60  cm      Systemic Diam: 1.50 cm Kirk Ruths MD Electronically signed by Kirk Ruths MD Signature Date/Time: 10/10/2021/2:04:01 PM    Final    ECHO TEE  Result Date: 10/19/2021    TRANSESOPHOGEAL ECHO REPORT   Patient Name:   Jillian Cooley Date of Exam: 10/17/2021 Medical Rec #:  [id number]             Height:       63.0 in Accession #:    [id number]            Weight:  122.1 lb Date of Birth:  [birth year]             BSA:          1.568 m Patient Age:    83 years              BP:           116/63 mmHg Patient Gender: F                     HR:           93 bpm. Exam Location:  Inpatient Procedure: 2D Echo, 3D Echo, Color Doppler and Cardiac Doppler Indications:     Mitral Regurgitation  History:         Patient has prior history of Echocardiogram examinations, most                  recent 10/10/2021. CHF; Risk Factors:Hypertension.  Sonographer:     Raquel Sarna Senior RDCS Referring Phys:  [id number] Tami Lin [facility] Diagnosing Phys: Cherlynn Kaiser MD PROCEDURE: After discussion of the risks and benefits of a TEE, an informed consent was obtained from the patient. The transesophogeal probe was passed without difficulty through the esophogus of the patient. Local oropharyngeal anesthetic was provided with Cetacaine. Sedation performed by different physician. The patient was monitored while under deep sedation. Anesthestetic sedation was provided intravenously by Anesthesiology: 173mg  of Propofol. The patient developed no complications during the procedure. IMPRESSIONS  1. The mitral valve is abnormally thickened, with restricted motion. Severe mitral valve regurgitation with at least 2 jets, mechanism of MR is likely from restricted leaflet motion and commissural malcoaptation. Systolic reversals in the pulmonary veins. The mean mitral valve gradient is 4.5 mmHg with average heart rate of 74 bpm, suggesting at least mild mitral stenosis. Moderate mitral annular calcification. Severe leaflet thickening.  Wilkins score 10-12.  2. Left ventricular ejection fraction, by estimation, is 60 to 65%. The left ventricle has normal function.  3. Right ventricular systolic function is mildly reduced. The right ventricular size is normal.  4. Left atrial size was moderately dilated. No left atrial/left atrial appendage thrombus was detected. The LAA emptying velocity was 54 cm/s.  5. Right atrial size was mildly dilated.  6. The tricuspid valve is abnormally thickened. Mild TR.  7. The aortic valve is abnormal. There is severe thickening of the aortic valve. Aortic valve regurgitation is mild. Mild aortic valve sclerosis is present, with no evidence of aortic valve stenosis.  8. There is Moderate (Grade III) atheroma plaque involving the transverse and descending aorta. Conclusion(s)/Recommendation(s): The mitral valve regurgitation on this exam appears at least moderate-severe by color flow however this is in the setting of significant hypotension, mean BP in 80/40s for majority of exam. There are still systolic reversals in pulmonary veins and at least two jets of MR. All cardiac valves (AV, TV, MV, PV) appear abnormal thickened, suggesting possible post inflammatory valve changes. Based on the appearance of the mitral valve, unlikely to be suitable for TEER, consider structural heart team discussion. FINDINGS  Left Ventricle: Left ventricular ejection fraction, by estimation, is 60 to 65%. The left ventricle has normal function. The left ventricular internal cavity size was normal in size. Right Ventricle: The right ventricular size is normal. Right vetricular wall thickness was not well visualized. Right ventricular systolic function is mildly reduced. Left Atrium: Left atrial size was moderately dilated. No left atrial/left atrial appendage thrombus was detected. The LAA emptying velocity  was 54 cm/s. Right Atrium: Right atrial size was mildly dilated. Pericardium: There is no evidence of pericardial effusion. Mitral  Valve: The mitral valve is abnormal. There is severe thickening of the mitral valve leaflet(s). Severely decreased mobility of the mitral valve leaflets. Moderate mitral annular calcification. Severe mitral valve regurgitation. Mild mitral valve stenosis. MV peak gradient, 7.6 mmHg. The mean mitral valve gradient is 4.5 mmHg with average heart rate of 74 bpm. Tricuspid Valve: The tricuspid valve is abnormal. Tricuspid valve regurgitation is mild. Aortic Valve: The aortic valve is abnormal. There is severe thickening of the aortic valve. Aortic valve regurgitation is mild. Mild aortic valve sclerosis is present, with no evidence of aortic valve stenosis. Pulmonic Valve: The pulmonic valve was thickened with mildly decreased excursion. Pulmonic valve regurgitation is trivial. Aorta: The aortic root and ascending aorta are structurally normal, with no evidence of dilitation. There is moderate (Grade III) atheroma plaque involving the transverse and descending aorta. IAS/Shunts: No atrial level shunt detected by color flow Doppler.   AORTA Ao Root diam: 3.00 cm Ao Asc diam:  3.40 cm MITRAL VALVE MV Peak grad: 7.6 mmHg MV Mean grad: 4.5 mmHg MV Vmax:      1.38 m/s MV Vmean:     97.0 cm/s MR Peak grad:    90.6 mmHg MR Mean grad:    67.0 mmHg MR Vmax:         476.00 cm/s MR Vmean:        394.0 cm/s MR PISA:         1.57 cm MR PISA Eff ROA: 17 mm MR PISA Radius:  0.50 cm Cherlynn Kaiser MD Electronically signed by Cherlynn Kaiser MD Signature Date/Time: 10/19/2021/8:55:34 AM    Final    VAS [place] LOWER EXTREMITY VENOUS (DVT)  Result Date: 10/11/2021  Lower Venous DVT Study Patient Name:  Jillian Cooley  Date of Exam:   10/10/2021 Medical Rec #: [id number]              Accession #:    [id number] Date of Birth: 1932-09-24              Patient Gender: F Patient Age:   7 years Exam Location:  [facility] Procedure:      VAS [place] LOWER EXTREMITY VENOUS (DVT) Referring Phys: MURALI RAMASWAMY  --------------------------------------------------------------------------------  Indications: Edema, and history of DVT.  Comparison Study: 04/25/2021 bilateral lower extremity venous duplex- acute DVT                   right soleal veins and left PTV. Performing Technologist: Maudry Mayhew MHA, RDMS, RVT, RDCS  Examination Guidelines: A complete evaluation includes B-mode imaging, spectral Doppler, color Doppler, and power Doppler as needed of all accessible portions of each vessel. Bilateral testing is considered an integral part of a complete examination. Limited examinations for reoccurring indications may be performed as noted. The reflux portion of the exam is performed with the patient in reverse Trendelenburg.  +---------+---------------+---------+-----------+----------+--------------+ RIGHT    CompressibilityPhasicitySpontaneityPropertiesThrombus Aging +---------+---------------+---------+-----------+----------+--------------+ CFV      Full           No       Yes                                 +---------+---------------+---------+-----------+----------+--------------+ SFJ      Full                                                        +---------+---------------+---------+-----------+----------+--------------+  FV Prox  Full                                                        +---------+---------------+---------+-----------+----------+--------------+ FV Mid   Full                                                        +---------+---------------+---------+-----------+----------+--------------+ FV DistalFull                                                        +---------+---------------+---------+-----------+----------+--------------+ PFV      Full                                                        +---------+---------------+---------+-----------+----------+--------------+ POP      Full           No       Yes                                  +---------+---------------+---------+-----------+----------+--------------+ PTV      Full                                                        +---------+---------------+---------+-----------+----------+--------------+ PERO     Full                                                        +---------+---------------+---------+-----------+----------+--------------+   Right Technical Findings: Not visualized segments include limited visualization PTV and peroneal veins.  +---------+---------------+---------+-----------+----------+--------------+ LEFT     CompressibilityPhasicitySpontaneityPropertiesThrombus Aging +---------+---------------+---------+-----------+----------+--------------+ CFV      Full           No       Yes                                 +---------+---------------+---------+-----------+----------+--------------+ SFJ      Full                                                        +---------+---------------+---------+-----------+----------+--------------+ FV Prox  Full                                                        +---------+---------------+---------+-----------+----------+--------------+  FV Mid   Full                                                        +---------+---------------+---------+-----------+----------+--------------+ FV DistalFull                                                        +---------+---------------+---------+-----------+----------+--------------+ PFV      Full                                                        +---------+---------------+---------+-----------+----------+--------------+ POP      Full           No       Yes                                 +---------+---------------+---------+-----------+----------+--------------+   Left Technical Findings: Not visualized segments include PTV, peroneal veins.   Summary: RIGHT: - There is no evidence of deep vein thrombosis in the lower extremity.  However, portions of this examination were limited- see technologist comments above.  - No cystic structure found in the popliteal fossa.  LEFT: - There is no evidence of deep vein thrombosis in the lower extremity. However, portions of this examination were limited- see technologist comments above.  - No cystic structure found in the popliteal fossa.  Bilateral lower extremity veins exhibit pulsatile flow, suggestive of possibly elevated right heart pressure.  *See table(s) above for measurements and observations. Electronically signed by Deitra Mayo MD on 10/11/2021 at 5:14:55 AM.    Final    ECHOCARDIOGRAM LIMITED  Result Date: 10/22/2021    ECHOCARDIOGRAM LIMITED REPORT   Patient Name:   Jillian Cooley Date of Exam: 10/22/2021 Medical Rec #:  [id number]             Height:       63.0 in Accession #:    [id number]            Weight:       110.5 lb Date of Birth:  February 17, 1932             BSA:          1.503 m Patient Age:    75 years              BP:           95/65 mmHg Patient Gender: F                     HR:           84 bpm. Exam Location:  Inpatient Procedure: 2D Echo, Cardiac Doppler and Limited Color Doppler Indications:    lmtd for pericardial effusion  History:        Patient has prior history of Echocardiogram examinations, most                 recent 10/10/2021. Previous  Myocardial Infarction, Pulmonary                 HTN, Mitral Valve Disease; Risk Factors:Hypertension. Pulmonary                 Embolism                 Pericardial effusion.  Sonographer:    Glo Herring Referring Phys: [id number] E[place]  Conclusion(s)/Recommendation(s): Normal LV size and function. Moderate pleural effusion. No significant pericardial effusion. Normal IVC. LEFT VENTRICLE PLAX 2D LVIDd:         3.00 cm LVIDs:         2.10 cm LV PW:         1.30 cm LV IVS:        1.30 cm  IVC IVC diam: 1.70 cm LEFT ATRIUM         Index LA diam:    3.80 cm 2.53 cm/m Phineas Inches Electronically signed by  Phineas Inches Signature Date/Time: 10/22/2021/7:50:28 PM    Final    US Abdomen Limited RUQ (LIVER/GB)  Result Date: 10/12/2021 CLINICAL DATA:  Cirrhosis. EXAM: ULTRASOUND ABDOMEN LIMITED RIGHT UPPER QUADRANT COMPARISON:  Renal ultrasound dated 09/12/2021. FINDINGS: Gallbladder: No gallstones or wall thickening visualized. No sonographic Murphy sign noted by sonographer. Common bile duct: Diameter: 4 mm Liver: No focal lesion identified. Increased parenchymal echogenicity. Portal vein is patent on color Doppler imaging with normal direction of blood flow towards the liver. Other: A right pleural effusion is noted. Images are suboptimal due to fast breathing by the patient and inability to perform breath hold during the exam. IMPRESSION: 1. Increased liver echogenicity likely reflects hepatic steatosis. No focal hepatic lesion is identified. 2.  Right pleural effusion. Electronically Signed   By: Zerita Boers M.D.   On: 10/12/2021 15:30   IR THORACENTESIS ASP PLEURAL SPACE W/IMG GUIDE  Result Date: 10/17/2021 INDICATION: Recurrent pleural effusion EXAM: ULTRASOUND GUIDED LEFT THORACENTESIS MEDICATIONS: None. COMPLICATIONS: None immediate. PROCEDURE: An ultrasound guided thoracentesis was thoroughly discussed with the patient and questions answered. The benefits, risks, alternatives and complications were also discussed. The patient understands and wishes to proceed with the procedure. Written consent was obtained. Ultrasound was performed to localize and mark an adequate pocket of fluid in the left chest. The area was then prepped and draped in the normal sterile fashion. 1% Lidocaine was used for local anesthesia. A 6 Fr Safe-T-Centesis catheter was introduced. Thoracentesis was performed. The catheter was removed and a dressing applied. FINDINGS: A total of approximately 1.6L of red tinged pleural fluid was removed. IMPRESSION: Successful ultrasound guided left thoracentesis yielding 1.6L of pleural fluid.  Electronically Signed   By: Miachel Roux M.D.   On: 10/17/2021 12:19   US THORACENTESIS ASP PLEURAL SPACE W/IMG GUIDE  Result Date: 10/12/2021 INDICATION: Chronic kidney disease with recurrent left pleural effusion. Request for therapeutic thoracentesis. EXAM: ULTRASOUND GUIDED LEFT THORACENTESIS MEDICATIONS: 1% lidocaine 8 mL COMPLICATIONS: None immediate. PROCEDURE: An ultrasound guided thoracentesis was thoroughly discussed with the patient and questions answered. The benefits, risks, alternatives and complications were also discussed. The patient understands and wishes to proceed with the procedure. Written consent was obtained. Ultrasound was performed to localize and mark an adequate pocket of fluid in the left chest. The area was then prepped and draped in the normal sterile fashion. 1% Lidocaine was used for local anesthesia. Under ultrasound guidance a 6 Fr Safe-T-Centesis catheter was introduced. Thoracentesis was performed. The catheter was removed  and a dressing applied. FINDINGS: A total of approximately 1.2 L of red tinged fluid was removed. IMPRESSION: Successful ultrasound guided left thoracentesis yielding 1.2 L of pleural fluid. Read by: Gareth Eagle, PA-C Electronically Signed   By: Ruthann Cancer M.D.   On: 10/12/2021 14:48   US THORACENTESIS ASP PLEURAL SPACE W/IMG GUIDE  Result Date: 10/10/2021 INDICATION: Shortness of breath with hypoxia. Large left pleural effusion. Request for diagnostic and therapeutic thoracentesis, some maximum of 1.5 L. EXAM: ULTRASOUND GUIDED LEFT THORACENTESIS MEDICATIONS: 1% plain lidocaine, 5 mL COMPLICATIONS: None immediate. PROCEDURE: An ultrasound guided thoracentesis was thoroughly discussed with the patient and questions answered. The benefits, risks, alternatives and complications were also discussed. The patient understands and wishes to proceed with the procedure. Written consent was obtained. Ultrasound was performed to localize and mark an adequate  pocket of fluid in the left chest. The area was then prepped and draped in the normal sterile fashion. 1% Lidocaine was used for local anesthesia. Under ultrasound guidance a 6 Fr Safe-T-Centesis catheter was introduced. Thoracentesis was performed. The catheter was removed and a dressing applied. FINDINGS: A total of approximately 1.5 L of clear yellow fluid was removed. Samples were sent to the laboratory as requested by the clinical team. IMPRESSION: Successful ultrasound guided left thoracentesis yielding 1.5 L of pleural fluid. Read by: Ascencion Dike PA-C Electronically Signed   By: Sandi Mariscal M.D.   On: 10/10/2021 14:49    ASSESSMENT & PLAN Jillian Cooley 87 y.o. female with medical history significant for Kappa light chain multiple myeloma who presents for a follow up visit via telephone visit.   Previously we discussed the nature of multiple myeloma and the treatment options moving forward.  The goal of treatment will be to get the patient into a durable remission on maintenance therapy.  Given her advanced age she is not a transplant candidate.  We discussed that this is a blood cancer there is of the bone marrow and can cause kidney dysfunction, anemia, and obstruction to the bone.  The patient currently has kidney dysfunction and anemia and we are currently awaiting the results of a metastatic bone survey.  Treatment of choice for this patient will be a chemotherapy regimen CyBorD.  This will consist of weekly treatments of cyclophosphamide 300 mg per metered squared, bortezomib 1.5 mg per metered squared, and 20 mg of dexamethasone p.o.  This will be continued until patient has completed 8 cycles were reached a VG PR.  At that time we can transition to maintenance therapy.  If the patient's kidney function were to improve markedly we could transition to a Revlimid-based treatment instead.  Unfortunately this patient has had a marked return in her clinical status and therefore I do not  believe would be a candidate for treatment with chemotherapy.  Today we discussed hospice with the patient's husband.  I would recommend we pursue this expediently as it sounds like her clinical condition is declining rapidly.  #Recurrent Pleural Effusions #Mitral Regurgitation #dCHF -- Patient has numerous etiologies for her worsening shortness of breath including recurrent pleural effusions, mitral regurgitation, diastolic heart failure, and history of DVT and PE --[name] multiple myeloma does not appear to be the cause of her recurrent pleural effusions as she does not have severe proteinuria --Defer to cardiology and pulmonary services regarding management of her shortness of breath --At this time I am concerned about her numerous comorbidities will prevent her from undergoing treatment for multiple myeloma. --Recommend referral to hospice.  #  Hx of bilateral DVT and PE: --Diagnosed on 04/25/2021 following recent orthopedic surgery on 04/23/2021, which is likely the provoking cause. --Currently on Eliquis 5 mg BID. Recommend to continue for a total of six months (around November 2022) --Labs from 06/17/2021 ruled out antiphospholipid syndrome.    #Normocytic anemia: --Multifactorial with CKD, iron deficiency and vitamin B12 deficiency.  --Labs from 06/27/2021 showed vitamin B12 level of 266, methylmalonic acid elevated 410, iron saturation 13%, serum iron 33, ferritin 130. --Recommended to start ferrous sulfate 325 mg once daily and vitamin B12 1000 mcg once daily. Recommend to continue.  --Repeat labs form 09/04/2021 (outside labs) show improvement of iron panel with iron 64, Iron saturation 28%, Ferritin 216.   #Multiple Myeloma: --Outside SPEP from 09/04/2021  revealed M-Spike 0.3 with immunofixation that showed monoclonal free kappa light chain. Serum FLC: Free Kappa Lt Chain: 4504.5 (H), Free Lambda Lt Chain: 19.7, Kappa/Lambda Ratio: 228.65 (H) --Need DG bone met survey to evaluate for  lytic lesions. Ordered, not yet scheduled.  --Elevated serum free light chain ratio with worsening renal function meets criteria for bone marrow biopsy, performed on 09/24/2021. R-ISS stage II Plan: --findings consistent with MM. Due to renal dysfunction plan was to start treatment with CyBorD chemotherapy --patient has severe deconditioning and worsening clinical  --Recommending referral to hospice.  We are helping to arrange this today.   No orders of the defined types were placed in this encounter.  All questions were answered. The patient knows to call the clinic with any problems, questions or concerns.  A total of more than 30 minutes were spent on this encounter with face-to-face time and non-face-to-face time, including preparing to see the patient, ordering tests and/or medications, counseling the patient and coordination of care as outlined above.   Ledell Peoples, MD Department of Hematology/Oncology [place] [facility] Phone: [phone] Pager: [phone] Email: [email or web address]  11/13/21 12:20 PM   *Addendum  While arranging to have the patient admitted to hospice the family called and reported that she had passed away at home.  Hospice agency will go to the household in order to pronounce and help the family make arrangements.  We are deeply saddened by this patient's passing and her sorry that we were not able to get her enrolled in hospice prior to her death.

## 2021-11-20 NOTE — Progress Notes (Deleted)
Cardiology Office Note    Date:  13-Nov-2021   ID:  Jillian Cooley, DOB 08-27-1932, MRN [id number]   PCP:  [name], MD   [place]  Cardiologist:  Werner Lean, MD *** Advanced Practice Provider:  No care team member to display Electrophysiologist:  None   [phone]   No chief complaint on file.   History of Present Illness:  Jillian Cooley is a 87 y.o. female with recently diagnosed multiple myeloma-hasn't started chemo yet, HTN, HLD, valvular heart disease (at least moderate, probably severe MR), CKD stage 4, carpal tunnel, bilateral DVT/PE after rotator cuff surgery 04/2021, pleural effusion s/p repeated thoracentesis admitted 10/21 with hypoxic respiratory failure and hypotension, lactic acidosis, elevated troponin, treated with IVF and stress dose steroids. She underwent L sided thoracentesis on 10/21 (1.5L) and repeat L sided thoracentesis on 10/23 (1.2L). Cytology -ve for malignancy.  Right heart catheterization performed 10/13/2021 showed mixed findings of acute left heart failure and intrinsic pulmonary artery hypertension (consider secondary to pulm embolism).  Transferred to [facility] for TEE to assess if MitraClip is an option, possibly involve HF team if not.      Past Medical History:  Diagnosis Date   Abnormal EKG    Carpal tunnel syndrome    CKD (chronic kidney disease), stage III (HCC)    DDD (degenerative disc disease), lumbar    Hypertension    Hypertension with renal disease    Menopause    Mixed hyperlipidemia    Muscular degeneration    Nonrheumatic aortic (valve) insufficiency    Nonrheumatic mitral valve regurgitation    Osteoarthritis    Pedal edema    Pleural effusion    Skin cancer    Vitamin D deficiency     Past Surgical History:  Procedure Laterality Date   ANTERIOR CERVICAL DECOMP/DISCECTOMY FUSION  2009   CARPAL TUNNEL RELEASE Bilateral    CHEST TUBE INSERTION Left 10/23/2021    Procedure: INSERTION PLEURAL DRAINAGE CATHETER;  Surgeon: Candee Furbish, MD;  Location: [facility] ENDOSCOPY;  Service: Pulmonary;  Laterality: Left;   IR THORACENTESIS ASP PLEURAL SPACE W/IMG GUIDE  04/25/2021   IR THORACENTESIS ASP PLEURAL SPACE W/IMG GUIDE  10/17/2021   LUMBAR LAMINECTOMY/DECOMPRESSION MICRODISCECTOMY  06/13/2012   Procedure: LUMBAR LAMINECTOMY/DECOMPRESSION MICRODISCECTOMY 1 LEVEL;  Surgeon: Ophelia Charter, MD;  Location: [place] NEURO ORS;  Service: Neurosurgery;  Laterality: Right;  RIGHT Lumbar two-three diskectomy   RIGHT HEART CATH N/A 10/13/2021   Procedure: RIGHT HEART CATH;  Surgeon: Larey Dresser, MD;  Location: [place] CV LAB;  Service: Cardiovascular;  Laterality: N/A;   SHOULDER ARTHROSCOPY WITH ROTATOR CUFF REPAIR AND SUBACROMIAL DECOMPRESSION Right 04/23/2021   Procedure: SHOULDER ARTHROSCOPY WITH ROTATOR CUFF REPAIR AND SUBACROMIAL DECOMPRESSION;  Surgeon: Earlie Server, MD;  Location: [place];  Service: Orthopedics;  Laterality: Right;   TEE WITHOUT CARDIOVERSION N/A 10/17/2021   Procedure: TRANSESOPHAGEAL ECHOCARDIOGRAM (TEE);  Surgeon: Elouise Munroe, MD;  Location: [place];  Service: Cardiology;  Laterality: N/A;   THORACENTESIS N/A 09/10/2021   Procedure: Mathews Robinsons;  Surgeon: Lanier Clam, MD;  Location: [facility] ENDOSCOPY;  Service: Pulmonary;  Laterality: N/A;    Current Medications: No outpatient medications have been marked as taking for the 11/19/21 encounter (Appointment) with Jillian Burn, PA-C.     Allergies:   Sulfa antibiotics   Social History   Socioeconomic History   Marital status: Married    Spouse name: Not on file  Number of children: Not on file   Years of education: Not on file   Highest education level: Not on file  Occupational History   Not on file  Tobacco Use   Smoking status: Never   Smokeless tobacco: Never  Substance and Sexual Activity   Alcohol use: No   Drug use: No   Sexual  activity: Not on file  Other Topics Concern   Not on file  Social History Narrative   Not on file   Social Determinants of Health   Financial Resource Strain: Not on file  Food Insecurity: Not on file  Transportation Needs: Not on file  Physical Activity: Not on file  Stress: Not on file  Social Connections: Not on file     Family History:  [name] ***family history includes Colon cancer in her maternal grandfather.   ROS:   Please see the history of present illness.    ROS All other systems reviewed and are negative.   PHYSICAL EXAM:   VS:  There were no vitals taken for this visit.  Physical Exam  GEN: Well nourished, well developed, in no acute distress  HEENT: normal  Neck: no JVD, carotid bruits, or masses Cardiac:RRR; no murmurs, rubs, or gallops  Respiratory:  clear to auscultation bilaterally, normal work of breathing GI: soft, nontender, nondistended, + BS Ext: without cyanosis, clubbing, or edema, Good distal pulses bilaterally MS: no deformity or atrophy  Skin: warm and dry, no rash Neuro:  Alert and Oriented x 3, Strength and sensation are intact Psych: euthymic mood, full affect  Wt Readings from Last 3 Encounters:  10/31/21 105 lb (47.6 kg)  10/25/21 104 lb 15 oz (47.6 kg)  09/30/21 125 lb 1.6 oz (56.7 kg)      Studies/Labs Reviewed:   EKG:  EKG is*** ordered today.  The ekg ordered today demonstrates ***  Recent Labs: 07/07/2021: NT-Pro BNP 3,761 10/16/2021: B Natriuretic Peptide 1,121.0 10/18/2021: Magnesium 2.1 10/31/2021: ALT 23; BUN 83; Creatinine, Ser 2.39; Hemoglobin 11.4; Platelets 267.0; Potassium 3.7; Sodium 127   Lipid Panel    Component Value Date/Time   CHOL 141 10/12/2021 0252   TRIG 61 10/12/2021 0252   HDL 45 10/12/2021 0252   CHOLHDL 3.1 10/12/2021 0252   VLDL 12 10/12/2021 0252   LDLCALC 84 10/12/2021 0252    Additional studies/ records that were reviewed today include:  Echocardiogram 10/10/2021      1. Normal  LV function; mild AI; severe MR; elevated mean gradient across  MV of 5 mmHg likely at least partially related to severe MR; moderate TR.   2. Left ventricular ejection fraction, by estimation, is 60 to 65%. The  left ventricle has normal function. The left ventricle has no regional  wall motion abnormalities. Left ventricular diastolic parameters are  indeterminate.   3. Right ventricular systolic function is normal. The right ventricular  size is normal. There is moderately elevated pulmonary artery systolic  pressure.   4. Left atrial size was moderately dilated.   5. Large pleural effusion in the left lateral region.   6. The mitral valve is abnormal. Severe mitral valve regurgitation. Mild  mitral stenosis.   7. Tricuspid valve regurgitation is moderate.   8. The aortic valve has an indeterminant number of cusps. Aortic valve  regurgitation is mild. No aortic stenosis is present.   9. The inferior vena cava is normal in size with <50% respiratory  variability, suggesting right atrial pressure of 8 mmHg.  Right heart catheterization 10/14/2021   Conclusion   1. Right and left heart filling pressures are elevated.  2. Prominent v-wave suggestive of significant mitral regurgitation.  3. Cardiac output is normal.  4. Mixed pulmonary venous/pulmonary arterial HTN   Right Heart   Right Heart Pressures RHC Procedural Findings: Hemodynamics (mmHg) RA mean 11 RV 66/9 PA 74/29, mean 49 PCWP mean 25, prominent V-waves to 37  Oxygen saturations: PA 62% AO 100%  Cardiac Output (Fick) 4.1  Cardiac Index (Fick) 2.6 PVR 5.8 WU          Risk Assessment/Calculations:   {Does this patient have ATRIAL FIBRILLATION?[phone]}     ASSESSMENT:    No diagnosis found.   PLAN:  In order of problems listed above:  Acute on chronic diastolic heart failure: Echo 10/10/2021 with normal LV function, mild AI, severe MR, moderate TR.  [place] 10/13/2021 with RA 11, RV 66/9,  PA 74/29/49, PCWP 25 with prominent V waves to 37, PA sat 62%, CI 2.6.  TEE 10/28 with thickened mitral valve with restricted leaflet motion and can measure all mall coaptation, severe MR, mild MS, EF 60 to 65%, mild RV dysfunction, mild AI. -On review of the TEE, I think the MR appears more moderate, 3D VCA totaled 0.19cm^2 for the 2 jets.  In addition there is no murmur on exam. Given discordant parameters on TEE, recommended cardiac MRI to quantify MR severity, which showed moderate MR (regurgitant fraction 21%)   -Given her multiple myeloma, cardiac MRI was also done to evaluate for evidence of amyloidosis.  Cardiac MRI does show some features of amyloid (LVH, elevated native T1) though nonspecific findings as unable to give contrast due to renal function, so not able to evaluate ECV or LGE to confirm amyloidosis  -Was started on hydralazine, held due to soft BP -Have avoided ACE/ARB given renal dysfunction -Creatinine trending up and worsening hyponatremia, decreased Lasix to 80 mg daily 10/31, renal function has stabilized.  Would continue Lasix 80 mg daily   Mitral insufficiency: Described as moderate on outside echocardiogram performed in February (unable to review images), moderate (but possibly underestimated) by echo in May.  TEE concerning for severe MR though there were discordant parameters as above (3D VCA 0.19cm^2); cardiac MRI shows moderate MR (regurgitant fraction 21%)   PAH: Mixed etiology due to diastolic heart failure and intrinsic pulmonary arterial disease (possibly sequelae of previous pulmonary embolism).  Note that she had evidence of moderate to severe pulmonary hypertension by echocardiogram performed in February. -Continue diuretics with Lasix 80 mg daily   Acute on chronic respiratory failure with hypoxia: Improved with thoracentesis and diuresis, but pleural effusion re-accumulated fast.  -Chest x-ray 10/29 showed slightly increased atelectasis versus airspace disease in  the left lower lung and little significant change in bilateral effusions with right lower lung consolidation/atelectasis.  Chest x-ray 11/1 with bilateral pleural effusions with interval increase in left pleural effusion.   -Pulmonology reconsulted, recommending Pleurx catheter, placed 11/3 -Continue p.o. Lasix 80 mg daily   NSTEMI: She does not have any regional wall motion abnormalities on the echocardiogram, but it is quite possible that she has underlying CAD based on the degree of increase in cardiac enzymes.   -She is not a candidate for invasive angiography and percutaneous revascularization or surgery.   -It is quite reasonable to treat her with aggressive lipid-lowering therapy.   -Stopped ASA since starting Eliquis    PE: Although this occurred in the setting of postoperative state, it is  very likely that she has a hypercoagulable condition due to the hematological disorder.   -Transition from heparin to Eliquis since no further procedures planned -Probably will remain on lifelong anticoagulation.   AKI on Chronic kidney disease stage IV: Creatinine stable at 2.4 today   Multiple myeloma: to discuss plans to resume therapy w her Oncologist.   Pericardial effusion: small to moderate on CMR 10/31, did not have effusion on prior echo.  Repeated limited echo 11/2, no significant pericardial effusion    Shared Decision Making/Informed Consent   {Are you ordering a CV Procedure (e.g. stress test, cath, DCCV, TEE, etc)?   Press F2        :[id number]}    Medication Adjustments/Labs and Tests Ordered: Current medicines are reviewed at length with the patient today.  Concerns regarding medicines are outlined above.  Medication changes, Labs and Tests ordered today are listed in the Patient Instructions below. There are no Patient Instructions on file for this visit.   Sumner Boast, PA-C  11/14/21 8:32 AM    [facility] [place], [place],    [address] Phone: [phone]; Fax: [phone]

## 2021-11-20 NOTE — Telephone Encounter (Signed)
Received call from pt's husband this morning after his call with [name]. He states that his wife is in pain and now cannot swallow. He needs something to give her for pain.  She is non verbal at this point. [name] has asked for Hospice referral. Advised that I will have [name] ordered liquid form of concentrated morphine that her will be able to give under her tongue, this will be absorbed through oral mucosa and help with pain relief. Explained how to give this medication and that the pharmacy will provide the syringe to give this with. He voiced understanding and will pick this up from their pharmacy. [name] ordered 20mg /ml and to give 1/2 ml every 4 hours as needed. Hospice referral has been made.   Received another call from the son @ 11:14 am stating that his mother has passed. They were just about to give the morphine when she stopped breathing,  Aaron Edelman is asking what they should do At this point. Advised that I would call Hospice to see if they would send a nurse out to pronounce patient as they have already received the referral. [facility] said they could not send anyone out, that the family would have to call 911. Asked to speak with the supervisor, Drue Dun. She also said they would of send anyone out.Advised that this family really needed ths upport.  She advised to call 911 and have EMS pronounce the death,  Call made to son and advised of the above. They do have a funeral home picked out. Explained how to dispose of the liquid morphine.  Received call back from [place] to let me know that , now, they will send a nurse out to pronounce the death after having it discussed with clinical managers  They have called the family to let them know.  [name] aware of the above.

## 2021-11-20 DEATH — deceased

## 2021-11-21 ENCOUNTER — Other Ambulatory Visit: Payer: Medicare PPO

## 2021-11-21 ENCOUNTER — Ambulatory Visit: Payer: Medicare PPO | Admitting: Hematology and Oncology

## 2021-11-21 ENCOUNTER — Ambulatory Visit: Payer: Medicare PPO

## 2021-11-25 ENCOUNTER — Inpatient Hospital Stay: Payer: Medicare PPO | Admitting: Pulmonary Disease

## 2021-11-26 LAB — ACID FAST CULTURE WITH REFLEXED SENSITIVITIES (MYCOBACTERIA): Acid Fast Culture: NEGATIVE

## 2021-11-28 ENCOUNTER — Ambulatory Visit: Payer: Medicare PPO

## 2021-11-28 ENCOUNTER — Other Ambulatory Visit: Payer: Medicare PPO

## 2021-12-04 ENCOUNTER — Ambulatory Visit: Payer: Medicare PPO

## 2021-12-04 ENCOUNTER — Ambulatory Visit: Payer: Medicare PPO | Admitting: Hematology and Oncology

## 2021-12-04 ENCOUNTER — Other Ambulatory Visit: Payer: Medicare PPO

## 2021-12-12 ENCOUNTER — Other Ambulatory Visit: Payer: Medicare PPO

## 2021-12-12 ENCOUNTER — Ambulatory Visit: Payer: Medicare PPO

## 2021-12-19 ENCOUNTER — Ambulatory Visit: Payer: Medicare PPO | Admitting: Hematology and Oncology

## 2021-12-19 ENCOUNTER — Ambulatory Visit: Payer: Medicare PPO

## 2021-12-19 ENCOUNTER — Other Ambulatory Visit: Payer: Medicare PPO

## 2021-12-26 ENCOUNTER — Ambulatory Visit: Payer: Medicare PPO

## 2021-12-26 ENCOUNTER — Other Ambulatory Visit: Payer: Medicare PPO

## 2022-01-02 ENCOUNTER — Ambulatory Visit: Payer: Medicare PPO

## 2022-01-02 ENCOUNTER — Other Ambulatory Visit: Payer: Medicare PPO
